# Patient Record
Sex: Male | Born: 1950
Health system: Southern US, Community
[De-identification: ages and names within clinical notes are randomized; demographics above are authoritative.]

## PROBLEM LIST (undated history)

## (undated) DIAGNOSIS — N289 Disorder of kidney and ureter, unspecified: Secondary | ICD-10-CM

## (undated) DIAGNOSIS — R918 Other nonspecific abnormal finding of lung field: Secondary | ICD-10-CM

## (undated) DIAGNOSIS — K759 Inflammatory liver disease, unspecified: Secondary | ICD-10-CM

## (undated) DIAGNOSIS — R0600 Dyspnea, unspecified: Secondary | ICD-10-CM

## (undated) DIAGNOSIS — C801 Malignant (primary) neoplasm, unspecified: Secondary | ICD-10-CM

## (undated) DIAGNOSIS — M1990 Unspecified osteoarthritis, unspecified site: Secondary | ICD-10-CM

## (undated) DIAGNOSIS — J939 Pneumothorax, unspecified: Secondary | ICD-10-CM

## (undated) DIAGNOSIS — M359 Systemic involvement of connective tissue, unspecified: Secondary | ICD-10-CM

## (undated) DIAGNOSIS — J189 Pneumonia, unspecified organism: Secondary | ICD-10-CM

## (undated) HISTORY — PX: OTHER SURGICAL HISTORY: SHX169

---

## 2016-03-25 ENCOUNTER — Emergency Department: Payer: Medicare Other

## 2016-03-25 ENCOUNTER — Encounter: Payer: Self-pay | Admitting: Emergency Medicine

## 2016-03-25 DIAGNOSIS — D721 Eosinophilia: Secondary | ICD-10-CM | POA: Diagnosis not present

## 2016-03-25 DIAGNOSIS — C7931 Secondary malignant neoplasm of brain: Secondary | ICD-10-CM | POA: Diagnosis not present

## 2016-03-25 DIAGNOSIS — M79662 Pain in left lower leg: Secondary | ICD-10-CM | POA: Diagnosis not present

## 2016-03-25 DIAGNOSIS — R252 Cramp and spasm: Secondary | ICD-10-CM | POA: Insufficient documentation

## 2016-03-25 DIAGNOSIS — C787 Secondary malignant neoplasm of liver and intrahepatic bile duct: Secondary | ICD-10-CM | POA: Diagnosis not present

## 2016-03-25 DIAGNOSIS — R918 Other nonspecific abnormal finding of lung field: Secondary | ICD-10-CM | POA: Diagnosis not present

## 2016-03-25 DIAGNOSIS — M1990 Unspecified osteoarthritis, unspecified site: Secondary | ICD-10-CM

## 2016-03-25 DIAGNOSIS — C3411 Malignant neoplasm of upper lobe, right bronchus or lung: Secondary | ICD-10-CM | POA: Diagnosis not present

## 2016-03-25 DIAGNOSIS — R05 Cough: Secondary | ICD-10-CM | POA: Diagnosis not present

## 2016-03-25 DIAGNOSIS — E43 Unspecified severe protein-calorie malnutrition: Secondary | ICD-10-CM | POA: Diagnosis not present

## 2016-03-25 DIAGNOSIS — D72829 Elevated white blood cell count, unspecified: Secondary | ICD-10-CM | POA: Insufficient documentation

## 2016-03-25 DIAGNOSIS — M79605 Pain in left leg: Secondary | ICD-10-CM | POA: Diagnosis not present

## 2016-03-25 DIAGNOSIS — I639 Cerebral infarction, unspecified: Secondary | ICD-10-CM | POA: Diagnosis not present

## 2016-03-25 LAB — BASIC METABOLIC PANEL
Anion gap: 10 (ref 5–15)
BUN: 12 mg/dL (ref 6–20)
CALCIUM: 8.6 mg/dL — AB (ref 8.9–10.3)
CHLORIDE: 103 mmol/L (ref 101–111)
CO2: 20 mmol/L — AB (ref 22–32)
CREATININE: 0.89 mg/dL (ref 0.61–1.24)
GFR calc non Af Amer: >60 mL/min (ref >60)
GLUCOSE: 124 mg/dL — AB (ref 65–99)
Potassium: 4.1 mmol/L (ref 3.5–5.1)
Sodium: 133 mmol/L — ABNORMAL LOW (ref 135–145)

## 2016-03-25 LAB — CBC
HEMATOCRIT: 40.7 % (ref 40.0–52.0)
Hemoglobin: 13.5 g/dL (ref 13.0–18.0)
MCH: 28.7 pg (ref 26.0–34.0)
MCHC: 33 g/dL (ref 32.0–36.0)
MCV: 87 fL (ref 80.0–100.0)
Platelets: 210 10*3/uL (ref 150–440)
RBC: 4.69 MIL/uL (ref 4.40–5.90)
RDW: 15.3 % — AB (ref 11.5–14.5)
WBC: 72.3 10*3/uL (ref 3.8–10.6)

## 2016-03-25 NOTE — ED Notes (Signed)
Pt states his left leg started cramping approx 1 hr ago and has pain behind left knee. Pulses good, no heat,

## 2016-03-25 NOTE — ED Notes (Signed)
Pt states weigh loss over the last 3 months while being sick possibly with flu but did not get diagnosed. Still has cough.

## 2016-03-26 ENCOUNTER — Emergency Department: Payer: Medicare Other

## 2016-03-26 ENCOUNTER — Emergency Department
Admission: EM | Admit: 2016-03-26 | Discharge: 2016-03-26 | Disposition: A | Discharge status: Home / Self Care | Payer: Medicare Other | Source: Home / Self Care | Attending: Emergency Medicine | Admitting: Emergency Medicine

## 2016-03-26 DIAGNOSIS — R252 Cramp and spasm: Secondary | ICD-10-CM

## 2016-03-26 DIAGNOSIS — R918 Other nonspecific abnormal finding of lung field: Secondary | ICD-10-CM

## 2016-03-26 DIAGNOSIS — R05 Cough: Secondary | ICD-10-CM | POA: Diagnosis not present

## 2016-03-26 DIAGNOSIS — D72829 Elevated white blood cell count, unspecified: Secondary | ICD-10-CM

## 2016-03-26 HISTORY — DX: Disorder of kidney and ureter, unspecified: N28.9

## 2016-03-26 HISTORY — DX: Unspecified osteoarthritis, unspecified site: M19.90

## 2016-03-26 HISTORY — DX: Pneumothorax, unspecified: J93.9

## 2016-03-26 LAB — URIC ACID: Uric Acid, Serum: 5 mg/dL (ref 4.4–7.6)

## 2016-03-26 LAB — DIFFERENTIAL
BASOS ABS: 0.7 10*3/uL — AB (ref 0–0.1)
BLASTS: 0 %
Band Neutrophils: 2 %
Basophils Relative: 1 %
EOS PCT: 53 %
Eosinophils Absolute: 38.3 10*3/uL — ABNORMAL HIGH (ref 0–0.7)
Lymphocytes Relative: 2 %
Lymphs Abs: 1.5 10*3/uL (ref 1.0–3.6)
MONOS PCT: 5 %
Metamyelocytes Relative: 0 %
Monocytes Absolute: 3.6 10*3/uL — ABNORMAL HIGH (ref 0.2–1.0)
Myelocytes: 0 %
NRBC: 0 /100{WBCs}
Neutro Abs: 28.2 10*3/uL — ABNORMAL HIGH (ref 1.4–6.5)
Neutrophils Relative %: 37 %
Other: 0 %
Promyelocytes Absolute: 0 %

## 2016-03-26 LAB — LACTATE DEHYDROGENASE: LDH: 204 U/L — AB (ref 98–192)

## 2016-03-26 MED ORDER — OXYCODONE-ACETAMINOPHEN 5-325 MG PO TABS
1.0000 | ORAL_TABLET | ORAL | Status: DC | PRN
  Administered 2016-03-26: 1 via ORAL
  Filled 2016-03-26: qty 1

## 2016-03-26 NOTE — ED Provider Notes (Signed)
[facility] Emergency Department Provider Note   ____________________________________________  Time seen: ~0325  I have reviewed the triage vital signs and the nursing notes.   HISTORY  Chief Complaint Leg Pain   History limited by: Not Limited   HPI Gerald [name] is a 65 y.o. male who presented to the emergency department today because of concerns for left leg pain. He stated that it was located behind his left knee and left calf. It started earlier today. He was out patient when it started. It became severe. He describes the quality as cramping. It has gradually improved since then. Said he thought he felt some tingling in that leg. In addition the patient states he has had a cough for the past 3 months. In addition he has noticed some night sweats and weight loss.   Past Medical History  Diagnosis Date  . Arthritis   . Pneumothorax     spontaneous  . Renal disorder     There are no active problems to display for this patient.   History reviewed. No pertinent past surgical history.  No current outpatient prescriptions on file.  Allergies Ibuprofen and Sulfa antibiotics  History reviewed. No pertinent family history.  Social History Social History  Substance Use Topics  . Smoking status: Never Smoker   . Smokeless tobacco: None  . Alcohol Use: Yes     Comment: occasionally    Review of Systems  Constitutional: Negative for fever. Cardiovascular: Negative for chest pain. Respiratory: Negative for shortness of breath.Positive for cough Gastrointestinal: Negative for abdominal pain, vomiting and diarrhea. Neurological: Negative for headaches, focal weakness or numbness.  10-point ROS otherwise negative.  ____________________________________________   PHYSICAL EXAM:  VITAL SIGNS: ED Triage Vitals  Enc Vitals Group     BP 03/25/16 2123 122/77 mmHg     Pulse Rate 03/25/16 2123 108     Resp 03/25/16 2123 18     Temp 03/25/16  2123 97.9 F (36.6 C)     Temp src --      SpO2 03/25/16 2123 100 %     Weight 03/25/16 2123 140 lb (63.504 kg)     Height 03/25/16 2123 '5\' 11"'$  (1.803 m)     Head Cir --      Peak Flow --      Pain Score 03/25/16 2127 3   Constitutional: Alert and oriented. Cachectic Eyes: Conjunctivae are normal. PERRL. Normal extraocular movements. ENT   Head: Normocephalic and atraumatic.   Nose: No congestion/rhinnorhea.   Mouth/Throat: Mucous membranes are moist.   Neck: No stridor. Hematological/Lymphatic/Immunilogical: No cervical lymphadenopathy. Cardiovascular: Normal rate, regular rhythm.  No murmurs, rubs, or gallops. Respiratory: Normal respiratory effort without tachypnea nor retractions. Breath sounds are clear and equal bilaterally. No wheezes/rales/rhonchi. Gastrointestinal: Soft and nontender. No distention.  Genitourinary: Deferred Musculoskeletal: Normal range of motion in all extremities. No joint effusions.  No lower extremity tenderness nor edema. No discoloration or deformity to the left leg. No tenderness palpation of the calf. Compartments are soft. Dorsalis pedis 2+. Neurologic:  Normal speech and language. No gross focal neurologic deficits are appreciated.  Skin:  Skin is warm, dry and intact. No rash noted. Psychiatric: Mood and affect are normal. Speech and behavior are normal. Patient exhibits appropriate insight and judgment.  ____________________________________________    LABS (pertinent positives/negatives)  Labs Reviewed  CBC - Abnormal; Notable for the following:    WBC 72.3 (*)    RDW 15.3 (*)    All other  components within normal limits  BASIC METABOLIC PANEL - Abnormal; Notable for the following:    Sodium 133 (*)    CO2 20 (*)    Glucose, Bld 124 (*)    Calcium 8.6 (*)    All other components within normal limits  DIFFERENTIAL - Abnormal; Notable for the following:    Neutro Abs 28.2 (*)    Monocytes Absolute 3.6 (*)    Eosinophils  Absolute 38.3 (*)    Basophils Absolute 0.7 (*)    All other components within normal limits  LACTATE DEHYDROGENASE - Abnormal; Notable for the following:    LDH 204 (*)    All other components within normal limits  URIC ACID     ____________________________________________   EKG  None  ____________________________________________    RADIOLOGY  CXR  IMPRESSION: Central right lung mass, highly suspicious for a primary malignancy. Right paratracheal adenopathy. Probable sub-carinal adenopathy. Recommend chest CT with contrast.  US venous IMPRESSION: No evidence of deep venous thrombosis in the left lower extremity.  ____________________________________________   PROCEDURES  Procedure(s) performed: None  Critical Care performed: No  ____________________________________________   INITIAL IMPRESSION / ASSESSMENT AND PLAN / ED COURSE  Pertinent labs & imaging results that were available during my care of the patient were reviewed by me and considered in my medical decision making (see chart for details).  Patient presented to the emergency department today because of concerns for left leg pain. The workup for the leg was unremarkable. Think this could be muscle cramping. He is CBC however was grossly abnormal with a severely elevated white blood count primarily of neutrophils in the sauna fills. Because the patient had a history of smoking and was also complaining of cough a chest x-ray was obtained. This was concerning for a primary malignancy. I had a discussion with the patient about this finding. Furthermore discussed with Dr. Mike Gip with oncology. She recommended LDH and uric acid at all. The LDH was very minimally elevated. At this point do not believe patient is a tumor lysis syndrome. Feel he is safe for discharge with oncology follow-up on Monday.  ____________________________________________   FINAL CLINICAL IMPRESSION(S) / ED DIAGNOSES  Final diagnoses:   Cramps of left lower extremity  Lung mass  Leukocytosis     Nance Pear, MD 03/26/16 [phone]

## 2016-03-26 NOTE — Discharge Instructions (Signed)
As we discussed it is very important that you follow up with Dr. Mike Gip on Monday. Please give their office a call first thing in the morning. Please seek medical attention for any high fevers, chest pain, shortness of breath, change in behavior, persistent vomiting, bloody stool or any other new or concerning symptoms.   Lung Cancer Lung cancer occurs when abnormal cells in the lung grow out of control and form a mass (tumor). There are several types of lung cancer. The two most common types are:  Non-small cell. In this type of lung cancer, abnormal cells are larger and grow more slowly than those of small cell lung cancer.  Small cell. In this type of lung cancer, abnormal cells are smaller than those of non-small cell lung cancer. Small cell lung cancer gets worse faster than non-small cell lung cancer. CAUSES  The leading cause of lung cancer is smoking tobacco. The second leading cause is radon exposure. RISK FACTORS  Smoking tobacco.  Exposure to secondhand tobacco smoke.  Exposure to radon gas.  Exposure to asbestos.  Exposure to arsenic in drinking water.  Air pollution.  Family or personal history of lung cancer.  Lung radiation therapy.  Being older than 69 years. SIGNS AND SYMPTOMS  In the early stages, symptoms may not be present. As the cancer progresses, symptoms may include:  A lasting cough, possibly with blood.  Fatigue.  Unexplained weight loss.  Shortness of breath.  Wheezing.  Chest pain.  Loss of appetite. Symptoms of advanced lung cancer include:  Hoarseness.  Bone or joint pain.  Weakness.  Nail problems.  Face or arm swelling.  Paralysis of the face.  Drooping eyelids. DIAGNOSIS  Lung cancer can be identified with a physical exam and with tests such as:  A chest X-ray.  A CT scan.  Blood tests.  A biopsy. After a diagnosis is made, you will have more tests to determine the stage of the cancer. The stages of non-small  cell lung cancer are:  Stage 0, also called carcinoma in situ. At this stage, abnormal cells are found in the inner lining of your lung or lungs.  Stage I. At this stage, abnormal cells have grown into a tumor that is no larger than 5 cm across. The cancer has entered the deeper lung tissue but has not yet entered the lymph nodes or other parts of the body.  Stage II. At this stage, the tumor is 7 cm across or smaller and has entered nearby lymph nodes. Or, the tumor is 5 cm across or smaller and has invaded surrounding tissue but is not found in nearby lymph nodes. There may be more than one tumor present.  Stage III. At this stage, the tumor may be any size. There may be more than one tumor in the lungs. The cancer cells have spread to the lymph nodes and possibly to other organs.  Stage IV. At this stage, there are tumors in both lungs and the cancer has spread to other areas of the body. The stages of small cell lung cancer are:  Limited. At this stage, the cancer is found only on one side of the chest.  Extensive. At this stage, the cancer is in the lungs and in tissues on the other side of the chest. The cancer has spread to other organs or is found in the fluid between the layers of your lungs. TREATMENT  Depending on the type and stage of your lung cancer, you may be treated  with:  Surgery. This is done to remove a tumor.  Radiation therapy. This treatment destroys cancer cells using X-rays or other types of radiation.  Chemotherapy. This treatment uses medicines to destroy cancer cells.  Targeted therapy. This treatment aims to destroy only cancer cells instead of all cells as other therapies do. You may also have a combination of treatments. HOME CARE INSTRUCTIONS   Do not use any tobacco products. This includes cigarettes, chewing tobacco, and electronic cigarettes. If you need help quitting, ask your health care provider.  Take medicines only as directed by your health care  provider.  Eat a healthy diet. Work with a dietitian to make sure you are getting the nutrition you need.  Consider joining a support group or seeking counseling to help you cope with the stress of having lung cancer.  Let your cancer specialist (oncologist) know if you are admitted to the hospital.  Keep all follow-up visits as directed by your health care provider. This is important. SEEK MEDICAL CARE IF:   You lose weight without trying.  You have a persistent cough and wheezing.  You feel short of breath.  You tire easily.  You experience bone or joint pain.  You have difficulty swallowing.  You feel hoarse or notice your voice changing.  Your pain medicine is not helping. SEEK IMMEDIATE MEDICAL CARE IF:   You cough up blood.  You have new breathing problems.  You develop chest pain.  You develop swelling in:  One or both ankles or legs.  Your face, neck, or arms.  You are confused.  You experience paralysis in your face or a drooping eyelid.   This information is not intended to replace advice given to you by your health care provider. Make sure you discuss any questions you have with your health care provider.   Document Released: 03/05/2001 Document Revised: 08/18/2015 Document Reviewed: 04/02/2014 Elsevier Interactive Patient Education [employer].

## 2016-03-27 ENCOUNTER — Encounter: Payer: Self-pay | Admitting: Hematology and Oncology

## 2016-03-27 ENCOUNTER — Inpatient Hospital Stay
Admission: EM | Admit: 2016-03-27 | Discharge: 2016-03-31 | DRG: 166 | Discharge status: Against Medical Advice | Payer: Medicare Other | Attending: Internal Medicine | Admitting: Internal Medicine

## 2016-03-27 ENCOUNTER — Telehealth: Payer: Self-pay

## 2016-03-27 ENCOUNTER — Inpatient Hospital Stay: Payer: Medicare Other

## 2016-03-27 ENCOUNTER — Encounter: Payer: Self-pay | Admitting: Emergency Medicine

## 2016-03-27 ENCOUNTER — Emergency Department: Payer: Medicare Other

## 2016-03-27 ENCOUNTER — Other Ambulatory Visit: Payer: Self-pay

## 2016-03-27 ENCOUNTER — Inpatient Hospital Stay: Payer: Medicare Other | Attending: Hematology and Oncology | Admitting: Hematology and Oncology

## 2016-03-27 VITALS — BP 103/67 | HR 106 | Temp 98.3°F | Wt 132.3 lb

## 2016-03-27 DIAGNOSIS — C3411 Malignant neoplasm of upper lobe, right bronchus or lung: Principal | ICD-10-CM | POA: Diagnosis present

## 2016-03-27 DIAGNOSIS — I493 Ventricular premature depolarization: Secondary | ICD-10-CM | POA: Diagnosis present

## 2016-03-27 DIAGNOSIS — D72829 Elevated white blood cell count, unspecified: Secondary | ICD-10-CM | POA: Diagnosis not present

## 2016-03-27 DIAGNOSIS — M1990 Unspecified osteoarthritis, unspecified site: Secondary | ICD-10-CM | POA: Diagnosis present

## 2016-03-27 DIAGNOSIS — K769 Liver disease, unspecified: Secondary | ICD-10-CM | POA: Insufficient documentation

## 2016-03-27 DIAGNOSIS — I639 Cerebral infarction, unspecified: Secondary | ICD-10-CM

## 2016-03-27 DIAGNOSIS — I517 Cardiomegaly: Secondary | ICD-10-CM

## 2016-03-27 DIAGNOSIS — D721 Eosinophilia, unspecified: Secondary | ICD-10-CM

## 2016-03-27 DIAGNOSIS — E43 Unspecified severe protein-calorie malnutrition: Secondary | ICD-10-CM | POA: Diagnosis not present

## 2016-03-27 DIAGNOSIS — C3490 Malignant neoplasm of unspecified part of unspecified bronchus or lung: Secondary | ICD-10-CM

## 2016-03-27 DIAGNOSIS — M879 Osteonecrosis, unspecified: Secondary | ICD-10-CM | POA: Insufficient documentation

## 2016-03-27 DIAGNOSIS — Z87891 Personal history of nicotine dependence: Secondary | ICD-10-CM

## 2016-03-27 DIAGNOSIS — R05 Cough: Secondary | ICD-10-CM | POA: Diagnosis not present

## 2016-03-27 DIAGNOSIS — Z79899 Other long term (current) drug therapy: Secondary | ICD-10-CM | POA: Insufficient documentation

## 2016-03-27 DIAGNOSIS — M79605 Pain in left leg: Secondary | ICD-10-CM | POA: Insufficient documentation

## 2016-03-27 DIAGNOSIS — R634 Abnormal weight loss: Secondary | ICD-10-CM | POA: Insufficient documentation

## 2016-03-27 DIAGNOSIS — C7989 Secondary malignant neoplasm of other specified sites: Secondary | ICD-10-CM | POA: Diagnosis present

## 2016-03-27 DIAGNOSIS — I248 Other forms of acute ischemic heart disease: Secondary | ICD-10-CM | POA: Diagnosis present

## 2016-03-27 DIAGNOSIS — C7931 Secondary malignant neoplasm of brain: Secondary | ICD-10-CM | POA: Diagnosis present

## 2016-03-27 DIAGNOSIS — C787 Secondary malignant neoplasm of liver and intrahepatic bile duct: Secondary | ICD-10-CM | POA: Diagnosis present

## 2016-03-27 DIAGNOSIS — Z681 Body mass index (BMI) 19 or less, adult: Secondary | ICD-10-CM | POA: Diagnosis not present

## 2016-03-27 DIAGNOSIS — R748 Abnormal levels of other serum enzymes: Secondary | ICD-10-CM | POA: Diagnosis not present

## 2016-03-27 DIAGNOSIS — Z882 Allergy status to sulfonamides status: Secondary | ICD-10-CM

## 2016-03-27 DIAGNOSIS — M129 Arthropathy, unspecified: Secondary | ICD-10-CM

## 2016-03-27 DIAGNOSIS — I5189 Other ill-defined heart diseases: Secondary | ICD-10-CM | POA: Insufficient documentation

## 2016-03-27 DIAGNOSIS — M069 Rheumatoid arthritis, unspecified: Secondary | ICD-10-CM | POA: Diagnosis present

## 2016-03-27 DIAGNOSIS — Z808 Family history of malignant neoplasm of other organs or systems: Secondary | ICD-10-CM | POA: Diagnosis not present

## 2016-03-27 DIAGNOSIS — Z01818 Encounter for other preprocedural examination: Secondary | ICD-10-CM | POA: Diagnosis not present

## 2016-03-27 DIAGNOSIS — R200 Anesthesia of skin: Secondary | ICD-10-CM | POA: Insufficient documentation

## 2016-03-27 DIAGNOSIS — R7989 Other specified abnormal findings of blood chemistry: Secondary | ICD-10-CM

## 2016-03-27 DIAGNOSIS — D72822 Plasmacytosis: Secondary | ICD-10-CM | POA: Insufficient documentation

## 2016-03-27 DIAGNOSIS — D479 Neoplasm of uncertain behavior of lymphoid, hematopoietic and related tissue, unspecified: Secondary | ICD-10-CM

## 2016-03-27 DIAGNOSIS — I499 Cardiac arrhythmia, unspecified: Secondary | ICD-10-CM

## 2016-03-27 DIAGNOSIS — R918 Other nonspecific abnormal finding of lung field: Secondary | ICD-10-CM

## 2016-03-27 DIAGNOSIS — N289 Disorder of kidney and ureter, unspecified: Secondary | ICD-10-CM

## 2016-03-27 DIAGNOSIS — E871 Hypo-osmolality and hyponatremia: Secondary | ICD-10-CM | POA: Diagnosis not present

## 2016-03-27 DIAGNOSIS — R52 Pain, unspecified: Secondary | ICD-10-CM

## 2016-03-27 DIAGNOSIS — Z801 Family history of malignant neoplasm of trachea, bronchus and lung: Secondary | ICD-10-CM | POA: Insufficient documentation

## 2016-03-27 DIAGNOSIS — C7951 Secondary malignant neoplasm of bone: Secondary | ICD-10-CM | POA: Diagnosis present

## 2016-03-27 DIAGNOSIS — Z888 Allergy status to other drugs, medicaments and biological substances status: Secondary | ICD-10-CM

## 2016-03-27 DIAGNOSIS — D471 Chronic myeloproliferative disease: Secondary | ICD-10-CM | POA: Diagnosis not present

## 2016-03-27 DIAGNOSIS — G939 Disorder of brain, unspecified: Secondary | ICD-10-CM | POA: Insufficient documentation

## 2016-03-27 DIAGNOSIS — Z0189 Encounter for other specified special examinations: Secondary | ICD-10-CM

## 2016-03-27 DIAGNOSIS — Z1389 Encounter for screening for other disorder: Secondary | ICD-10-CM

## 2016-03-27 DIAGNOSIS — R599 Enlarged lymph nodes, unspecified: Secondary | ICD-10-CM | POA: Insufficient documentation

## 2016-03-27 DIAGNOSIS — R630 Anorexia: Secondary | ICD-10-CM | POA: Insufficient documentation

## 2016-03-27 DIAGNOSIS — R899 Unspecified abnormal finding in specimens from other organs, systems and tissues: Secondary | ICD-10-CM | POA: Diagnosis not present

## 2016-03-27 DIAGNOSIS — Z809 Family history of malignant neoplasm, unspecified: Secondary | ICD-10-CM | POA: Insufficient documentation

## 2016-03-27 DIAGNOSIS — R059 Cough, unspecified: Secondary | ICD-10-CM | POA: Insufficient documentation

## 2016-03-27 DIAGNOSIS — R778 Other specified abnormalities of plasma proteins: Secondary | ICD-10-CM | POA: Insufficient documentation

## 2016-03-27 DIAGNOSIS — D649 Anemia, unspecified: Secondary | ICD-10-CM | POA: Diagnosis not present

## 2016-03-27 DIAGNOSIS — I998 Other disorder of circulatory system: Secondary | ICD-10-CM | POA: Insufficient documentation

## 2016-03-27 DIAGNOSIS — R5383 Other fatigue: Secondary | ICD-10-CM | POA: Insufficient documentation

## 2016-03-27 DIAGNOSIS — C799 Secondary malignant neoplasm of unspecified site: Secondary | ICD-10-CM | POA: Diagnosis not present

## 2016-03-27 DIAGNOSIS — F17200 Nicotine dependence, unspecified, uncomplicated: Secondary | ICD-10-CM | POA: Diagnosis not present

## 2016-03-27 HISTORY — DX: Systemic involvement of connective tissue, unspecified: M35.9

## 2016-03-27 HISTORY — DX: Other nonspecific abnormal finding of lung field: R91.8

## 2016-03-27 LAB — URINALYSIS COMPLETE WITH MICROSCOPIC (ARMC ONLY)
BACTERIA UA: NONE SEEN
Bilirubin Urine: NEGATIVE
Glucose, UA: NEGATIVE mg/dL
HGB URINE DIPSTICK: NEGATIVE
Ketones, ur: NEGATIVE mg/dL
LEUKOCYTES UA: NEGATIVE
Nitrite: NEGATIVE
PH: 5 (ref 5.0–8.0)
Protein, ur: NEGATIVE mg/dL
RBC / HPF: NONE SEEN RBC/hpf (ref 0–5)
Specific Gravity, Urine: 1.02 (ref 1.005–1.030)

## 2016-03-27 LAB — CBC WITH DIFFERENTIAL/PLATELET
Band Neutrophils: 3 %
Basophils Absolute: 1.7 10*3/uL — ABNORMAL HIGH (ref 0–0.1)
Basophils Relative: 2 %
Eosinophils Absolute: 47.3 10*3/uL — ABNORMAL HIGH (ref 0–0.7)
Eosinophils Relative: 56 %
HCT: 39.5 % — ABNORMAL LOW (ref 40.0–52.0)
Hemoglobin: 12.9 g/dL — ABNORMAL LOW (ref 13.0–18.0)
Lymphocytes Relative: 6 %
Lymphs Abs: 5.1 10*3/uL — ABNORMAL HIGH (ref 1.0–3.6)
MCH: 29 pg (ref 26.0–34.0)
MCHC: 32.7 g/dL (ref 32.0–36.0)
MCV: 88.5 fL (ref 80.0–100.0)
Monocytes Absolute: 3.4 10*3/uL — ABNORMAL HIGH (ref 0.2–1.0)
Monocytes Relative: 4 %
Neutro Abs: 27 10*3/uL — ABNORMAL HIGH (ref 1.4–6.5)
Neutrophils Relative %: 29 %
Platelets: 237 10*3/uL (ref 150–440)
RBC: 4.47 MIL/uL (ref 4.40–5.90)
RDW: 15.1 % — ABNORMAL HIGH (ref 11.5–14.5)
WBC: 84.4 10*3/uL (ref 3.8–10.6)

## 2016-03-27 LAB — COMPREHENSIVE METABOLIC PANEL
ALT: 28 U/L (ref 17–63)
AST: 30 U/L (ref 15–41)
Albumin: 3.1 g/dL — ABNORMAL LOW (ref 3.5–5.0)
Alkaline Phosphatase: 85 U/L (ref 38–126)
Anion gap: 5 (ref 5–15)
BUN: 12 mg/dL (ref 6–20)
CO2: 25 mmol/L (ref 22–32)
Calcium: 8.2 mg/dL — ABNORMAL LOW (ref 8.9–10.3)
Chloride: 102 mmol/L (ref 101–111)
Creatinine, Ser: 0.86 mg/dL (ref 0.61–1.24)
GFR calc Af Amer: >60 mL/min (ref >60)
GFR calc non Af Amer: >60 mL/min (ref >60)
Glucose, Bld: 88 mg/dL (ref 65–99)
Potassium: 3.8 mmol/L (ref 3.5–5.1)
Sodium: 132 mmol/L — ABNORMAL LOW (ref 135–145)
Total Bilirubin: 0.8 mg/dL (ref 0.3–1.2)
Total Protein: 8.5 g/dL — ABNORMAL HIGH (ref 6.5–8.1)

## 2016-03-27 LAB — PATHOLOGIST SMEAR REVIEW

## 2016-03-27 LAB — TROPONIN I
TROPONIN I: 0.07 ng/mL — AB (ref <0.031)
TROPONIN I: 0.07 ng/mL — AB (ref <0.031)
Troponin I: 0.11 ng/mL — ABNORMAL HIGH (ref <0.031)

## 2016-03-27 LAB — TSH: TSH: 1.849 u[IU]/mL (ref 0.350–4.500)

## 2016-03-27 LAB — VITAMIN B12: Vitamin B-12: 203 pg/mL (ref 180–914)

## 2016-03-27 MED ORDER — ONDANSETRON HCL 4 MG/2ML IJ SOLN: 4.0000 mg | Freq: Four times a day (QID) | INTRAMUSCULAR | Status: DC | PRN

## 2016-03-27 MED ORDER — OXYCODONE HCL 5 MG PO TABS
5.0000 mg | ORAL_TABLET | ORAL | Status: DC | PRN
  Administered 2016-03-29: 5 mg via ORAL
  Filled 2016-03-27: qty 1

## 2016-03-27 MED ORDER — SODIUM CHLORIDE 0.9 % IV BOLUS (SEPSIS)
1000.0000 mL | Freq: Once | INTRAVENOUS | Status: AC
  Administered 2016-03-27: 1000 mL via INTRAVENOUS

## 2016-03-27 MED ORDER — ACETAMINOPHEN 325 MG PO TABS: 650.0000 mg | ORAL_TABLET | Freq: Four times a day (QID) | ORAL | Status: DC | PRN

## 2016-03-27 MED ORDER — PNEUMOCOCCAL VAC POLYVALENT 25 MCG/0.5ML IJ INJ: 0.5000 mL | INJECTION | INTRAMUSCULAR | Status: DC

## 2016-03-27 MED ORDER — IOPAMIDOL (ISOVUE-370) INJECTION 76%
75.0000 mL | Freq: Once | INTRAVENOUS | Status: AC | PRN
  Administered 2016-03-27: 75 mL via INTRAVENOUS

## 2016-03-27 MED ORDER — HEPARIN SODIUM (PORCINE) 5000 UNIT/ML IJ SOLN
5000.0000 [IU] | Freq: Three times a day (TID) | INTRAMUSCULAR | Status: DC
  Administered 2016-03-27: 5000 [IU] via SUBCUTANEOUS
  Filled 2016-03-27: qty 1

## 2016-03-27 MED ORDER — SODIUM CHLORIDE 0.9 % IV SOLN
INTRAVENOUS | Status: DC
  Administered 2016-03-27 – 2016-03-28 (×2): via INTRAVENOUS

## 2016-03-27 MED ORDER — ACETAMINOPHEN 650 MG RE SUPP: 650.0000 mg | Freq: Four times a day (QID) | RECTAL | Status: DC | PRN

## 2016-03-27 MED ORDER — ONDANSETRON HCL 4 MG PO TABS: 4.0000 mg | ORAL_TABLET | Freq: Four times a day (QID) | ORAL | Status: DC | PRN

## 2016-03-27 MED ORDER — SODIUM CHLORIDE 0.9% FLUSH
3.0000 mL | Freq: Two times a day (BID) | INTRAVENOUS | Status: DC
  Administered 2016-03-27 – 2016-03-31 (×5): 3 mL via INTRAVENOUS

## 2016-03-27 NOTE — Progress Notes (Signed)
New patient today to be eval for Lung mass and leukocytosis. Went to ED on 03/25/16 with L leg pain; still has some leg pain but it is no longer severe. Has had an ongoing cough since he" caught the Flu " on the first week of January. Pt has lost 30 lbs since December. He has barely left the house in 3 months. Pt states he has been eating quite a bit lately. Has very  infrequent nausea. BM's are irregular, some constipation, some diarrhea. Denies headaches or dizziness. Voiding normally. Has RA, off methotrexate, not taking anything. Quit smoking 15 years ago. Smoker for 26 years ~ 1/2 pack per day.

## 2016-03-27 NOTE — Telephone Encounter (Signed)
Called and spoke with daughter and pt.  Critical troponin called in of 0.11  Per MD she would like pt to follow up in ER.  MD aware.  Pt verbalized an understanding and was still downstairs speaking with a friend in cancer center when called.  Pt verbalized he will head over to ER.  No other concerns noted.

## 2016-03-27 NOTE — H&P (Signed)
Gerald [name]    MR#:  [id number]  DATE OF BIRTH:  12/05/1951  DATE OF ADMISSION:  03/27/2016  PRIMARY CARE PHYSICIAN: No PCP Per Patient   REQUESTING/REFERRING PHYSICIAN: Dr. Harvest Dark  CHIEF COMPLAINT:   Chief Complaint  Patient presents with  . Abnormal Lab    HISTORY OF PRESENT ILLNESS:  Gerald [name]  is a 65 y.o. male with a known history of rheumatoid arthritis not taking any medications, who has no PCP presented to the emergency room 2 days ago for left leg pain. Dopplers were done at the time and did not reveal any DVT. Blood work showed increased white count of 72,000 and also chest x-ray at the time showing central right lung mass suspicious for primary malignancy. Patient was given an appointment to see oncologist for today. He was at his oncologist's office and routine blood work there indicated that his troponin was elevated at 0.11 and so sent to the emergency room. Patient denies any chest pain or palpitations. He says he's been losing weight for almost 3-4 months after a viral illness. He lost almost 30 pounds in the last 3 months. Denies any nausea or vomiting now but has had occasional nausea. Denies any headaches or dizziness. Bowel movements are irregular. No fevers or chills currently. He's been having dry cough since his viral fever, occasionally hasn't paid any significant attention to it. No hemoptysis. Denies any chest pain or palpitations. His breathing has become shallower lately but no sudden change in breathing noted. CT of the chest in the emergency room here reveals 8 cm right upper lobe mass possibly invading the left atrium. And also possible mass in the left liver lobe concerning for hepatic metastasis disease. So patient is being admitted for the same.  PAST MEDICAL HISTORY:   Past Medical History  Diagnosis Date  . Arthritis     Rheumatoid arthritis  . Pneumothorax      spontaneous  . Renal disorder     as a child  . Mass of lung   . Collagen vascular disease ([place])     RA    PAST SURGICAL HISTORY:   Past Surgical History  Procedure Laterality Date  . Ureter repair as a child      SOCIAL HISTORY:   Social History  Substance Use Topics  . Smoking status: Former Smoker -- 0.50 packs/day for 26 years    Types: Cigarettes    Quit date: 01/31/2016  . Smokeless tobacco: Not on file     Comment: Quit about 5 years ago  . Alcohol Use: 0.0 oz/week    0 Standard drinks or equivalent per week     Comment: occasionally beer    FAMILY HISTORY:   Family History  Problem Relation Age of Onset  . Brain cancer Mother   . Lung cancer Maternal Uncle   . Cancer Sister     Metastatic cancer- unknown primary    DRUG ALLERGIES:   Allergies  Allergen Reactions  . Nsaids Other (See Comments)    Pt is unable to take this class of medications.    . Sulfa Antibiotics Other (See Comments)    Reaction:  GI upset     REVIEW OF SYSTEMS:   Review of Systems  Constitutional: Positive for weight loss and malaise/fatigue. Negative for fever and chills.  HENT: Negative for ear discharge, ear pain, nosebleeds and tinnitus.   Eyes: Negative for blurred  vision, double vision and photophobia.  Respiratory: Positive for shortness of breath. Negative for cough, hemoptysis and wheezing.   Cardiovascular: Negative for chest pain, palpitations, orthopnea and leg swelling.  Gastrointestinal: Negative for heartburn, nausea, vomiting, abdominal pain, diarrhea, constipation and melena.  Genitourinary: Negative for dysuria, urgency, frequency and hematuria.  Musculoskeletal: Negative for myalgias, back pain and neck pain.       Leg pain- left leg  Skin: Negative for rash.  Neurological: Negative for dizziness, tremors, sensory change, speech change, focal weakness and headaches.  Endo/Heme/Allergies: Does not bruise/bleed easily.  Psychiatric/Behavioral: Negative for  depression.    MEDICATIONS AT HOME:   Prior to Admission medications   Not on File      VITAL SIGNS:  Blood pressure 128/84, pulse 104, temperature 98 F (36.7 C), temperature source Oral, resp. rate 18, SpO2 99 %.  PHYSICAL EXAMINATION:   Physical Exam  GENERAL:  65 y.o.-year-old emaciated patient lying in the bed with no acute distress.  EYES: Pupils equal, round, reactive to light and accommodation. No scleral icterus. Extraocular muscles intact.  HEENT: Head atraumatic, normocephalic. Oropharynx and nasopharynx clear. Sunken temporal fossae NECK:  Supple, no jugular venous distention. No thyroid enlargement, no tenderness. No supraclavicular or neck lymphadenopathy LUNGS: Normal breath sounds bilaterally, scant breath sounds, no wheezing, rales,rhonchi or crepitation. No use of accessory muscles of respiration.  CARDIOVASCULAR: S1, S2 normal. Rapid and regular. No murmurs, rubs, or gallops.  ABDOMEN: Soft, nontender, nondistended. Bowel sounds present. No organomegaly or mass.  EXTREMITIES: No pedal edema, cyanosis, or clubbing. No calf tenderness NEUROLOGIC: Cranial nerves II through XII are intact. Muscle strength 5/5 in all extremities. Sensation intact. Gait not checked.  PSYCHIATRIC: The patient is alert and oriented x 3.  SKIN: No obvious rash, lesion, or ulcer.   LABORATORY PANEL:   CBC  Recent Labs Lab 03/27/16 1303  WBC 84.4*  HGB 12.9*  HCT 39.5*  PLT 237   ------------------------------------------------------------------------------------------------------------------  Chemistries   Recent Labs Lab 03/27/16 1303  NA 132*  K 3.8  CL 102  CO2 25  GLUCOSE 88  BUN 12  CREATININE 0.86  CALCIUM 8.2*  AST 30  ALT 28  ALKPHOS 85  BILITOT 0.8   ------------------------------------------------------------------------------------------------------------------  Cardiac Enzymes  Recent Labs Lab 03/27/16 1701  TROPONINI 0.07*    ------------------------------------------------------------------------------------------------------------------  RADIOLOGY:  Dg Chest 2 View  03/26/2016  CLINICAL DATA:  Cough and weight loss. EXAM: CHEST  2 VIEW COMPARISON:  None. FINDINGS: There is a 5-6 cm mass in the central right lung. There are abnormal mediastinal contours consistent with right peritracheal adenopathy, and there may also be subcarinal mediastinal adenopathy. There is generalized interstitial coarsening. There is blunting of the lateral and posterior costophrenic angles on the left, suggesting a small effusion. No right-sided effusion. IMPRESSION: Central right lung mass, highly suspicious for a primary malignancy. Right paratracheal adenopathy. Probable sub-carinal adenopathy. Recommend chest CT with contrast. These results will be called to the ordering clinician or representative by the Radiologist Assistant, and communication documented in the PACS or zVision Dashboard. Electronically Signed   By: Andreas Newport M.D.   On: 03/26/2016 02:54   Ct Angio Chest Pe W/cm &/or Wo Cm  03/27/2016  CLINICAL DATA:  Abnormal chest radiograph with lung mass, elevated troponin and tachycardia. EXAM: CT ANGIOGRAPHY CHEST WITH CONTRAST TECHNIQUE: Multidetector CT imaging of the chest was performed using the standard protocol during bolus administration of intravenous contrast. Multiplanar CT image reconstructions and MIPs were obtained to evaluate  the vascular anatomy. CONTRAST:  Study 5 mL Isovue 370 IV COMPARISON:  Chest radiographs dated 03/26/2016 FINDINGS: No evidence of pulmonary embolism. Pulmonary artery leading to the right middle lobe is attenuated by the dominant mass (described below). Mediastinum/Nodes: The heart is normal in size. No pericardial effusion. Three vessel coronary atherosclerosis. Thoracic lymphadenopathy, including: --1.6 cm short axis high right paratracheal node (series 4/image 45) --2.3 cm short axis right  paratracheal node (series 4/image 57) --1.5 cm short axis AP window node (series 4/image 60) --2.3 cm short axis low right paratracheal node (series 4/image 63) --1.9 cm short axis subcarinal node (series 4/image 76) Visualized thyroid is unremarkable. Lungs/Pleura: 8.0 x 4.7 x 5.7 cm mass in the medial right upper lobe (series 4/ image 78). Mass occludes a branch of the right upper lobe bronchus (series 6/ image 70) and narrows the right middle lobe bronchus (series 6/ image 88). Mass abuts the mediastinum and may indicate the left atrium, possibly via a pulmonary vein (series 4/ image 89). Increased interstitial markings with satellite nodularity in the right upper lobe measuring up to 10 x 14 mm (series 6/image 83), metastatic disease not excluded. Additional increased interstitial markings with volume loss in the right middle lobe (series 6/image 90), possibly reflecting postobstructive opacity. However, a 7 mm nodule is present in the right middle lobe (series 6/ image 98), and lymphangitic spread with additional tumor is not excluded. Biapical pleural-parenchymal scarring, right greater than left. Underlying mild centrilobular and paraseptal emphysematous changes with bullous changes of the left lung apex. Trace left pleural effusion. Mild dependent atelectasis in the left lower lobe. No pneumothorax. Upper abdomen: Suspected 2.6 cm enhancing lesion in the medial segment left hepatic lobe (series 4/image 151), worrisome for hepatic metastasis. Heterogeneous perfusion with scarring in the medial left upper kidney, likely reflecting sequela of prior infarct or infection. Multiple wedge-shaped infarcts of the spleen, likely reflecting prior vascular insults. Musculoskeletal: No focal osseous lesions. Review of the MIP images confirms the above findings. IMPRESSION: No evidence of pulmonary embolism. 8.0 cm mass in the medial right upper lobe, as described above, suspicious for primary bronchogenic neoplasm. Mass  abuts the mediastinum and possibly invades the left atrium. Associated widespread thoracic lymphadenopathy, as above. 2.6 cm enhancing lesion in the medial segment left hepatic lobe, worrisome for hepatic metastasis. Trace left pleural effusion. Electronically Signed   By: [name] Hy M.D.   On: 03/27/2016 18:05   US Venous Img Lower Unilateral Left  03/25/2016  CLINICAL DATA:  Posterior left knee and left calf pain. Shortness of breath for 3 months . EXAM: LEFT LOWER EXTREMITY VENOUS DOPPLER ULTRASOUND TECHNIQUE: Gray-scale sonography with graded compression, as well as color Doppler and duplex ultrasound were performed to evaluate the lower extremity deep venous systems from the level of the common femoral vein and including the common femoral, femoral, profunda femoral, popliteal and calf veins including the posterior tibial, peroneal and gastrocnemius veins when visible. The superficial great saphenous vein was also interrogated. Spectral Doppler was utilized to evaluate flow at rest and with distal augmentation maneuvers in the common femoral, femoral and popliteal veins. COMPARISON:  None. FINDINGS: Contralateral Common Femoral Vein: Respiratory phasicity is normal and symmetric with the symptomatic side. No evidence of thrombus. Normal compressibility. Common Femoral Vein: No evidence of thrombus. Normal compressibility, respiratory phasicity and response to augmentation. Saphenofemoral Junction: No evidence of thrombus. Normal compressibility and flow on color Doppler imaging. Profunda Femoral Vein: No evidence of thrombus. Normal compressibility and flow on  color Doppler imaging. Femoral Vein: No evidence of thrombus. Normal compressibility, respiratory phasicity and response to augmentation. Popliteal Vein: No evidence of thrombus. Normal compressibility, respiratory phasicity and response to augmentation. Calf Veins: No evidence of thrombus. Normal compressibility and flow on color Doppler  imaging. Superficial Great Saphenous Vein: No evidence of thrombus. Normal compressibility and flow on color Doppler imaging. Venous Reflux:  None. Other Findings:  None. IMPRESSION: No evidence of deep venous thrombosis in the left lower extremity. Electronically Signed   By: Ilona Sorrel M.D.   On: 03/25/2016 22:29    EKG:   Orders placed or performed during the hospital encounter of 03/27/16  . EKG 12-Lead  . EKG 12-Lead    IMPRESSION AND PLAN:   Gerald [name]  is a 65 y.o. male with a known history of rheumatoid arthritis not taking any medications, who has no PCP presented to the emergency room 2 days ago for left leg pain. He was noted to have white count of 72,000 and right lung mass and sent to oncologist office today. Workup at oncology office revealed elevated troponin and so patient is sent to the emergency room.  #1 elevated troponin- Likely demand ischemia and strain on the heart from the lung mass possibly abutting the left atrium - recycle troponins. PVCs on monitor- monitor on tele - ECHO ordered. Check TSH - oncology consulted.  #2 right lung mass- Right lung mass, 8cm, likely malignancy. Also liver lesion-? Metastatic spread - oncology consulted. PET scan likely  #3 leukocytosis-likely underlying lymphoproliferative disorder. White count today is further elevated at 84,000 -Bone marrow biopsy requested for tomorrow. Also will need a PET scan No evidence of any infection. Hold off on antibiotics. Check urine analysis though.  #4 rheumatoid arthritis-not following with any physician. No arthritis symptoms or flares at this time.  #5 DVT prophylaxis-on subcutaneous heparin    All the records are reviewed and case discussed with ED provider. Management plans discussed with the patient, family and they are in agreement.  CODE STATUS: Full Code  TOTAL TIME TAKING CARE OF THIS PATIENT: 50 minutes.    Gladstone Lighter M.D on 03/27/2016 at 7:23 PM  Between 7am to 6pm -  Pager - [phone]  After 6pm go to [email or web address] - password [email or web address] [facility]  [facility]  Office  [phone]  CC: Primary care physician; No PCP Per Patient

## 2016-03-27 NOTE — Progress Notes (Signed)
[facility] day:  03/27/2016  Chief Complaint: Gerald [name] is a 65 y.o. male with leukocytosis, eosinophilia, and a lung mass who is referred in consultation by Dr. Nance Pear for assessment and management.  HPI:  Prior to recent events, the patient last saw a physician approximately 13-14 years ago.  At that time, he was seen by rheumatology at [facility].  He initially had joint complaints in his hands.  He was offered treatment with Remicade, but did not receive treatment.  He did receive MTX in the past.  Since that time, he has had progressive arthritis symptoms in his hands and other joints.  He has been on disability.  He notes a 13-15 pack year smoking history. He smoked for 26 years and stopped smoking 15 years ago.  He was in his usual state of good health until early 12/2015 when he developed flu like symptoms.  He describes diffuse aches, fever 102 with chills, cough, runny nose, and slight nausea.  His symptoms resolved except his cough and poor appetite.  His daughter also notes some hoarseness.  He states that his energy level has continued to be low.  He has had some shortness of breath.  He denies any chest pain.  Since that time, he has lost 30 pounds.  On 03/25/2016, while fishing, he developed cramping in his left calf.  Pain was severe.  He had difficulty walking.  His family pushed him to seek medical attention.  He presented to the ER on 03/25/2016.  Left lower extremity duplex on 03/25/2016 revealed no evidence of DVT.  CXR on 03/26/2016 revealed a 5-6 cm central right lung mass suspicious for primary lung cancer.  There was right paratracheal adenopathy and probable sub-carinal adenopathy.    Labs revealed a hematocrit of 40.7, hemoglobin 13.5, MCV 87, platelets 210,000, WBC 72,300 with an ANC of 28,200.  Differential revealed 37% neutrophils, 2% lymphocytes, 5% monocytes, 53% eosinophils, and 1% basophils.  Absolute eosinophil  count was 38,300.  BMP revealed a creatinine of 0.89.  LDH was 204.  Uric acid was 5.0.  Symptomatically, he notes that the pain in his left leg is now a 1 of out of 10 (nagging discomfort) instead of 9 that he experienced on 03/25/2016.  He states that he has been eating more lately.  He continues to have a cough.  He has had 1-2 episodes of scant hemoptysis.  He notes a little numbness in his left foot.     Past Medical History  Diagnosis Date  . Arthritis   . Pneumothorax     spontaneous  . Renal disorder     No past surgical history on file.  No family history on file.  Social History:  reports that he quit smoking about 8 weeks ago. His smoking use included Cigarettes. He has a 13 pack-year smoking history. He does not have any smokeless tobacco history on file. He reports that he drinks alcohol. His drug history is not on file.  He lives in [place].  Contact number is [phone].  The patient is accompanied by his daughter, Gerald [name], today.  Allergies:  Allergies  Allergen Reactions  . Ibuprofen   . Sulfa Antibiotics     Current Medications: No current outpatient prescriptions on file.   No current facility-administered medications for this visit.    Review of Systems:  GENERAL:  Fatigue.  No fevers or sweats.  Weight loss of 30 pounds since 12/2015. PERFORMANCE  STATUS (ECOG):  1 HEENT:  Blurry vision if goes outside.  Last eye check 8-10 years ago.  Pollen allergy.  No sore throat, mouth sores or tenderness. Lungs:  Shortness of breath.  Cough productive of clear/white/gray sputum.  Scant hemoptysis x 1. Cardiac:  No chest pain, palpitations, orthopnea, or PND.  Sleeps in a recliner. GI:  Poor appetite which has improved recently.  No nausea, vomiting, diarrhea, constipation, melena or hematochezia. GU:  No urgency, frequency, dysuria, or hematuria. Musculoskeletal:  No back pain.  No joint pain.  No muscle tenderness. Extremities:  No pain or swelling. Skin:  No  rashes or skin changes. Neuro:  Seldom headache.  Little numbness bottom of left foot extends upward slightly (sock distribution). No weakness, balance or coordination issues. Endocrine:  No diabetes, thyroid issues, hot flashes or night sweats. Psych:  No mood changes, depression or anxiety. Pain:  No focal pain. Review of systems:  All other systems reviewed and found to be negative.  Physical Exam: Blood pressure 103/67, pulse 106, temperature 98.3 F (36.8 C), weight 132 lb 4.4 oz (60 kg), SpO2 96 %. GENERAL:  Thin gentleman sitting comfortably in the exam room in no acute distress. MENTAL STATUS:  Alert and oriented to person, place and time. HEAD:  Long gray hair in pony tail.  Goatee.  Temporal wasting.  Normocephalic, atraumatic, face symmetric, no Cushingoid features. EYES:  Blue eyes.  Pupils equal round and reactive to light and accomodation.  No conjunctivitis or scleral icterus. ENT:  Oropharynx clear without lesion.  Edentulous except 1 tooth.  Tongue normal. Mucous membranes moist.  RESPIRATORY:  Clear to auscultation without rales, wheezes or rhonchi. CARDIOVASCULAR:  Intermittent irregular rate and rhythm without murmur, rub or gallop. ABDOMEN:  Soft, non-tender, with active bowel sounds, and no splenomegaly.  Left upper quadrant full.  No masses. SKIN:  No urticaria pigmentosa.  Negative Darier's sign.  No rashes, ulcers or lesions. EXTREMITIES: No edema, no skin discoloration or tenderness.  No palpable cords. LYMPH NODES: No palpable cervical, supraclavicular, axillary or inguinal adenopathy  NEUROLOGICAL: Alert & oriented, cranial nerves II-XII intact; motor strength 5/5 throughout; sensation intact; finger to nose and RAM normal; able to walk heel to toe; negative Rhomberg; no clonus or Babinski. PSYCH:  Appropriate.   Admission on 03/26/2016, Discharged on 03/26/2016  Component Date Value Ref Range Status  . WBC 03/25/2016 72.3* 3.8 - 10.6 K/uL Final   Comment:  RESULT REPEATED AND VERIFIED CRITICAL RESULT CALLED TO, READ BACK BY AND VERIFIED WITH: ANN CALES AT 2208 03/25/16.PMH   . RBC 03/25/2016 4.69  4.40 - 5.90 MIL/uL Final  . Hemoglobin 03/25/2016 13.5  13.0 - 18.0 g/dL Final  . HCT 03/25/2016 40.7  40.0 - 52.0 % Final  . MCV 03/25/2016 87.0  80.0 - 100.0 fL Final  . MCH 03/25/2016 28.7  26.0 - 34.0 pg Final  . MCHC 03/25/2016 33.0  32.0 - 36.0 g/dL Final  . RDW 03/25/2016 15.3* 11.5 - 14.5 % Final  . Platelets 03/25/2016 210  150 - 440 K/uL Final  . Sodium 03/25/2016 133* 135 - 145 mmol/L Final  . Potassium 03/25/2016 4.1  3.5 - 5.1 mmol/L Final  . Chloride 03/25/2016 103  101 - 111 mmol/L Final  . CO2 03/25/2016 20* 22 - 32 mmol/L Final  . Glucose, Bld 03/25/2016 124* 65 - 99 mg/dL Final  . BUN 03/25/2016 12  6 - 20 mg/dL Final  . Creatinine, Ser 03/25/2016 0.89  0.61 -  1.24 mg/dL Final  . Calcium 03/25/2016 8.6* 8.9 - 10.3 mg/dL Final  . GFR calc non Af Amer 03/25/2016 >60  >60 mL/min Final  . GFR calc Af Amer 03/25/2016 >60  >60 mL/min Final   Comment: (NOTE) The eGFR has been calculated using the CKD EPI equation. This calculation has not been validated in all clinical situations. eGFR's persistently <60 mL/min signify possible Chronic Kidney Disease.   . Anion gap 03/25/2016 10  5 - 15 Final  . Neutro Abs 03/25/2016 28.2* 1.4 - 6.5 K/uL Final  . Lymphs Abs 03/25/2016 1.5  1.0 - 3.6 K/uL Final  . Monocytes Absolute 03/25/2016 3.6* 0.2 - 1.0 K/uL Final  . Eosinophils Absolute 03/25/2016 38.3* 0 - 0.7 K/uL Final  . Basophils Absolute 03/25/2016 0.7* 0 - 0.1 K/uL Final  . Neutrophils Relative % 03/25/2016 37   Final  . Lymphocytes Relative 03/25/2016 2   Final  . Monocytes Relative 03/25/2016 5   Final  . Eosinophils Relative 03/25/2016 53   Final  . Basophils Relative 03/25/2016 1   Final  . Band Neutrophils 03/25/2016 2   Final  . Metamyelocytes Relative 03/25/2016 0   Final  . Myelocytes 03/25/2016 0   Final  .  Promyelocytes Absolute 03/25/2016 0   Final  . Blasts 03/25/2016 0   Final  . nRBC 03/25/2016 0  0 /100 WBC Final  . Other 03/25/2016 0   Final  . WBC Morphology 03/25/2016 VACUOLATED NEUTROPHILS   Final  . Smear Review 03/25/2016 LARGE PLATELETS PRESENT   Final  . Uric Acid, Serum 03/26/2016 5.0  4.4 - 7.6 mg/dL Final  . LDH 03/26/2016 204* 98 - 192 U/L Final    Assessment:  Gerald [name] is a 65 y.o. male with leukocytosis with eosinophilia and a right middle lobe mass.  CXR on 03/26/2016 revealed a 5-6 cm central right lung mass suspicious for primary lung cancer.  There was right paratracheal adenopathy and probable sub-carinal adenopathy.  He has a 13-15 pack year smoking history.  He has marked leukocytosis with hypereosinophilia worrisome for a myeloproliferative neoplasm.  Eosinophilia can be associated with lung cancer (large cell or adenocarcinoma) as well as lymphoma.  He has no skin, gastrointestinal, or rheumatologic conditions.  He has no wheezing.  He has shortness of breath likely due to his right chest mass.  He has no wheezing.  He has lost 30 pounds in the past 4 months.  LDH and uric acid are normal.  He has unexplained left leg discomfort. Left lower extremity duplex on 03/25/2016 revealed no evidence of DVT.  He may have a peripheral neuropathy secondary to his hypereosinophilia.  Plan: 1.  Review labs and imaging studies with patient.  Discuss concern for primary lung cancer.  Discuss chest CT with contrast and biopsy.  Discuss PET scan.  Preliminary discussions regarding staging and treatment. 2.  Discussed marked leukocytosis and eosinophilia possibly secondary to lung cancer, but concern for hypereosinophilia syndrome given markedly elevated counts.  Discuss work-up with labs, EKG, and echo.  Discuss possible bone marrow. 3.  Labs today:  CBC with diff, CMP, B12, tryptase, SPEP, immunoglobulin levels, HIV antibody, HTLV I and II, troponin, flow cytometry with T cell  subsets. 4.  Peripheral smear for path review. 5.  EKG. 6.  Chest CT with contrast:  assess lung mass. 7.  RTC end of week to discuss work-up and direction of therapy.  Addendum:  EKG revealed biatrial enlargement without arrhythmia.  Lab  called with elevated troponin.  Patient directed to the ER for anticipated admission.    Review of peripheral smear reveals marked eosinophilia with increased neutophils, basophils, monocytes, and a few myelocytes.  A myeloproliferative neoplasm is suspected.  Additional testing will be sent including the panel for myeloproliferative neoplasm with hypereosinophilia, BCR-ABL, and JAK2.   Lequita Asal, MD  03/27/2016, 11:31 AM

## 2016-03-27 NOTE — ED Provider Notes (Signed)
[facility] Emergency Department Provider Note  Time seen: 4:42 PM  I have reviewed the triage vital signs and the nursing notes.   HISTORY  Chief Complaint Abnormal Lab    HPI Gerald [name] is a 65 y.o. male with a past medical history of arthritis,recently diagnosed lung mass, presents the emergency department for an elevated troponin. According to the patient and record review the patient was seen in the emergency department 4/15 for left leg cramps. During his workup the patient had a chest x-ray showing a lung mass, and a significantly elevated white blood cell count. Patient was sent to the [facility], had a follow-up appointment today during the routine bloodwork a troponin was checked which resulted elevated as well as a significant leukocytosis of 84,000 and the patient was referred to the emergency department for further workup. Patient denies any chest pain now or anytime recently. Does state an occasional cough but denies any fever or sputum production. Patient used to smoke cigarettes but quit approximately 15 years ago. States the left leg pain has largely resolved although will occasionally cramp on him if he lies flat.     Past Medical History  Diagnosis Date  . Arthritis   . Pneumothorax     spontaneous  . Renal disorder   . Mass of lung     Patient Active Problem List   Diagnosis Date Noted  . Mass of middle lobe of right lung 03/27/2016  . Eosinophilia 03/27/2016  . Leukocytosis 03/27/2016    History reviewed. No pertinent past surgical history.  No current outpatient prescriptions on file.  Allergies Ibuprofen and Sulfa antibiotics  No family history on file.  Social History Social History  Substance Use Topics  . Smoking status: Former Smoker -- 0.50 packs/day for 26 years    Types: Cigarettes    Quit date: 01/31/2016  . Smokeless tobacco: None  . Alcohol Use: Yes     Comment: occasionally    Review of  Systems Constitutional: Negative for fever Cardiovascular: Negative for chest pain. Respiratory: Negative for shortness of breath.Occasional cough. Gastrointestinal: Negative for abdominal pain Musculoskeletal: Negative for back pain. Neurological: Negative for headaches 10-point ROS otherwise negative.  ____________________________________________   PHYSICAL EXAM:  VITAL SIGNS: ED Triage Vitals  Enc Vitals Group     BP 03/27/16 1514 114/69 mmHg     Pulse Rate 03/27/16 1514 101     Resp 03/27/16 1514 20     Temp 03/27/16 1514 98 F (36.7 C)     Temp Source 03/27/16 1514 Oral     SpO2 03/27/16 1514 96 %     Weight --      Height --      Head Cir --      Peak Flow --      Pain Score 03/27/16 1546 2     Pain Loc --      Pain Edu? --      Excl. in [place]? --     Constitutional: Alert and oriented. Well appearing and in no distress. Eyes: Normal exam ENT   Head: Normocephalic and atraumatic   Mouth/Throat: Mucous membranes are moist. Cardiovascular: Normal rate, regular rhythm. No murmur Respiratory: Normal respiratory effort without tachypnea nor retractions. Breath sounds are clear  Gastrointestinal: Soft and nontender. No distention.   Musculoskeletal: Nontender with normal range of motion in all extremities. Neurologic:  Normal speech and language. No gross focal neurologic deficits Skin:  Skin is warm, dry and intact.  Psychiatric: Mood and affect are normal. Speech and behavior are normal.   ____________________________________________    EKG  EKG reviewed and interpreted by myself shows sinus rhythm at 99 bpm, narrow QRS, normal axis, frequent PVCs. Normal intervals, no concerning ST changes noted.   INITIAL IMPRESSION / ASSESSMENT AND PLAN / ED COURSE  Pertinent labs & imaging results that were available during my care of the patient were reviewed by me and considered in my medical decision making (see chart for details).  Patient referred to the  emergency department after a visit at the cancer center in which an elevated troponin was found during a workup. Patient denies any chest pain, nausea or diaphoresis. Denies shortness of breath. Denies any pleuritic chest pain. We will repeat a troponin. Given the elevated troponin with recent left leg pain/cramping we'll obtain a CT angiography to rule out PE and also further evaluate the chest mass.  CT scan shows an 8 cm right lung mass, likely bronchogenic carcinoma, unfortunate appears to be abutting versus invading the left atrium of the heart. There is also a 2.6 cm mass in the liver concerning for metastatic spread. I discussed these results with his oncologist Dr.Corcoran, he states she did a blood smear today showing a very large amount of eosinophils. She wishes for the patient to be admitted to the hospital for an expedient workup. I discussed this with the patient as well as a CT results, patient is reluctantly agreeable to admission.  ____________________________________________   FINAL CLINICAL IMPRESSION(S) / ED DIAGNOSES  Lung mass Eosinophilia   Harvest Dark, MD 03/27/16 1840

## 2016-03-27 NOTE — ED Notes (Signed)
Assisted patient up to bathroom and back into bed. Pt up with steady gait.

## 2016-03-27 NOTE — ED Notes (Signed)
Hospitalist at bedside 

## 2016-03-27 NOTE — ED Notes (Signed)
Pt sent over from Cancer center for further eval of abnormal labs. Pt was seen here on Saturday and found out he had a spot on his  Lung, possibly cancer.

## 2016-03-28 ENCOUNTER — Other Ambulatory Visit: Payer: Self-pay

## 2016-03-28 ENCOUNTER — Inpatient Hospital Stay (HOSPITAL_COMMUNITY)
Admit: 2016-03-28 | Discharge: 2016-03-28 | Disposition: A | Discharge status: Home / Self Care | Payer: Medicare Other | Attending: Internal Medicine | Admitting: Internal Medicine

## 2016-03-28 DIAGNOSIS — Z87891 Personal history of nicotine dependence: Secondary | ICD-10-CM

## 2016-03-28 DIAGNOSIS — R634 Abnormal weight loss: Secondary | ICD-10-CM

## 2016-03-28 DIAGNOSIS — I5189 Other ill-defined heart diseases: Secondary | ICD-10-CM | POA: Insufficient documentation

## 2016-03-28 DIAGNOSIS — D721 Eosinophilia: Secondary | ICD-10-CM

## 2016-03-28 DIAGNOSIS — D479 Neoplasm of uncertain behavior of lymphoid, hematopoietic and related tissue, unspecified: Secondary | ICD-10-CM

## 2016-03-28 DIAGNOSIS — I998 Other disorder of circulatory system: Secondary | ICD-10-CM

## 2016-03-28 DIAGNOSIS — R059 Cough, unspecified: Secondary | ICD-10-CM | POA: Insufficient documentation

## 2016-03-28 DIAGNOSIS — R5383 Other fatigue: Secondary | ICD-10-CM

## 2016-03-28 DIAGNOSIS — R918 Other nonspecific abnormal finding of lung field: Secondary | ICD-10-CM

## 2016-03-28 DIAGNOSIS — R05 Cough: Secondary | ICD-10-CM | POA: Insufficient documentation

## 2016-03-28 DIAGNOSIS — M069 Rheumatoid arthritis, unspecified: Secondary | ICD-10-CM

## 2016-03-28 DIAGNOSIS — R630 Anorexia: Secondary | ICD-10-CM

## 2016-03-28 DIAGNOSIS — K769 Liver disease, unspecified: Secondary | ICD-10-CM

## 2016-03-28 DIAGNOSIS — D72829 Elevated white blood cell count, unspecified: Secondary | ICD-10-CM

## 2016-03-28 DIAGNOSIS — R7989 Other specified abnormal findings of blood chemistry: Secondary | ICD-10-CM

## 2016-03-28 DIAGNOSIS — E43 Unspecified severe protein-calorie malnutrition: Secondary | ICD-10-CM

## 2016-03-28 DIAGNOSIS — F17200 Nicotine dependence, unspecified, uncomplicated: Secondary | ICD-10-CM

## 2016-03-28 DIAGNOSIS — R599 Enlarged lymph nodes, unspecified: Secondary | ICD-10-CM

## 2016-03-28 DIAGNOSIS — R222 Localized swelling, mass and lump, trunk: Secondary | ICD-10-CM

## 2016-03-28 DIAGNOSIS — M79605 Pain in left leg: Secondary | ICD-10-CM

## 2016-03-28 DIAGNOSIS — R778 Other specified abnormalities of plasma proteins: Secondary | ICD-10-CM | POA: Insufficient documentation

## 2016-03-28 LAB — PROTEIN ELECTROPHORESIS, SERUM
A/G Ratio: 0.6 — ABNORMAL LOW (ref 0.7–1.7)
Albumin ELP: 3 g/dL (ref 2.9–4.4)
Alpha-1-Globulin: 0.3 g/dL (ref 0.0–0.4)
Alpha-2-Globulin: 0.6 g/dL (ref 0.4–1.0)
Beta Globulin: 1.2 g/dL (ref 0.7–1.3)
Gamma Globulin: 2.9 g/dL — ABNORMAL HIGH (ref 0.4–1.8)
Globulin, Total: 5.1 g/dL — ABNORMAL HIGH (ref 2.2–3.9)
Total Protein ELP: 8.1 g/dL (ref 6.0–8.5)

## 2016-03-28 LAB — CBC WITH DIFFERENTIAL/PLATELET
Basophils Absolute: 0.7 10*3/uL — ABNORMAL HIGH (ref 0–0.1)
Basophils Relative: 1 %
Eosinophils Absolute: 36.3 10*3/uL — ABNORMAL HIGH (ref 0–0.7)
Eosinophils Relative: 51 %
HCT: 37 % — ABNORMAL LOW (ref 40.0–52.0)
Hemoglobin: 11.9 g/dL — ABNORMAL LOW (ref 13.0–18.0)
Lymphocytes Relative: 5 %
Lymphs Abs: 3.6 10*3/uL (ref 1.0–3.6)
MCH: 28.6 pg (ref 26.0–34.0)
MCHC: 32.1 g/dL (ref 32.0–36.0)
MCV: 88.9 fL (ref 80.0–100.0)
Monocytes Absolute: 2.8 10*3/uL — ABNORMAL HIGH (ref 0.2–1.0)
Monocytes Relative: 4 %
Neutro Abs: 27.7 10*3/uL — ABNORMAL HIGH (ref 1.4–6.5)
Neutrophils Relative %: 39 %
Platelets: 232 10*3/uL (ref 150–440)
RBC: 4.16 MIL/uL — ABNORMAL LOW (ref 4.40–5.90)
RDW: 15.1 % — ABNORMAL HIGH (ref 11.5–14.5)
WBC: 71.1 10*3/uL (ref 3.8–10.6)

## 2016-03-28 LAB — BASIC METABOLIC PANEL
Anion gap: 4 — ABNORMAL LOW (ref 5–15)
BUN: 9 mg/dL (ref 6–20)
CALCIUM: 8.2 mg/dL — AB (ref 8.9–10.3)
CHLORIDE: 106 mmol/L (ref 101–111)
CO2: 24 mmol/L (ref 22–32)
CREATININE: 0.95 mg/dL (ref 0.61–1.24)
GFR calc non Af Amer: >60 mL/min (ref >60)
Glucose, Bld: 86 mg/dL (ref 65–99)
Potassium: 4 mmol/L (ref 3.5–5.1)
SODIUM: 134 mmol/L — AB (ref 135–145)

## 2016-03-28 LAB — ECHOCARDIOGRAM COMPLETE
Height: 71 in
WEIGHTICAEL: 2097.6 [oz_av]

## 2016-03-28 LAB — MISC LABCORP TEST (SEND OUT)
LabCorp test name: 505750
Labcorp test code: 505750

## 2016-03-28 LAB — TROPONIN I
Troponin I: 0.06 ng/mL — ABNORMAL HIGH (ref <0.031)
Troponin I: 0.08 ng/mL — ABNORMAL HIGH (ref <0.031)

## 2016-03-28 LAB — APTT: aPTT: 38 seconds — ABNORMAL HIGH (ref 24–36)

## 2016-03-28 LAB — MAGNESIUM: Magnesium: 1.8 mg/dL (ref 1.7–2.4)

## 2016-03-28 LAB — CEA: CEA: 2.3 ng/mL (ref 0.0–4.7)

## 2016-03-28 LAB — PROTIME-INR
INR: 1.27
Prothrombin Time: 16 seconds — ABNORMAL HIGH (ref 11.4–15.0)

## 2016-03-28 MED ORDER — ENOXAPARIN SODIUM 40 MG/0.4ML ~~LOC~~ SOLN
40.0000 mg | SUBCUTANEOUS | Status: DC
  Administered 2016-03-29 – 2016-03-30 (×2): 40 mg via SUBCUTANEOUS
  Filled 2016-03-28 (×2): qty 0.4

## 2016-03-28 MED ORDER — SODIUM CHLORIDE 0.9 % IV SOLN
INTRAVENOUS | Status: DC
  Administered 2016-03-29: 06:00:00 via INTRAVENOUS

## 2016-03-28 MED ORDER — ENSURE ENLIVE PO LIQD
237.0000 mL | Freq: Two times a day (BID) | ORAL | Status: DC
  Administered 2016-03-29 – 2016-03-31 (×3): 237 mL via ORAL

## 2016-03-28 NOTE — Care Management Note (Signed)
Case Management Note  Patient Details  Name: Gerald [name] MRN: [id number] Date of Birth: 11-13-1951  Subjective/Objective:    Patient admitted with elevated troponin and lung mass.  RNCM placed due to patient having concerns of being hospitalized and Medicare.  I attempted to completed assessment on patient.  At that time family was present, and patient was eating dinner.  Patient stated "i haven't eaten in 2 days, can you come back later".  Upon chart review it appears that the patient has a history of rheumatoid arthritis, does not take any medications, and does not have a PCP.  Per pulmonary consult  Right upper lobe mass highly suspicious for malignancy.                 Action/Plan: RNCM to complete assessment and follow for discharge needs  Expected Discharge Date:                  Expected Discharge Plan:     In-House Referral:     Discharge planning Services     Post Acute Care Choice:    Choice offered to:     DME Arranged:    DME Agency:     HH Arranged:    [place] Agency:     Status of Service:     Medicare Important Message Given:    Date Medicare IM Given:    Medicare IM give by:    Date Additional Medicare IM Given:    Additional Medicare Important Message give by:     If discussed at [place] of Stay Meetings, dates discussed:    Additional Comments:  Beverly Sessions, RN 03/28/2016, 3:09 PM

## 2016-03-28 NOTE — Progress Notes (Signed)
°   03/28/16 1500  Clinical Encounter Type  Visited With Patient and family together  Visit Type Initial  Referral From Other (Comment)  Pastoral care visit. [place]

## 2016-03-28 NOTE — Progress Notes (Signed)
Patient is scheduled for a bone marrow biopsy today. Attempted to get consent from patient and he stated he would like to talk to the doctor first. Patient is open to having it done, he just would like to be informed of the procedure and the process of it before hand. Secretary informed cancer center when consult was called that patient wants to see the MD first.

## 2016-03-28 NOTE — Consult Note (Signed)
PULMONARY / CRITICAL CARE MEDICINE   Name: Gerald [name] MRN: [id number] DOB: [birth year]    ADMISSION DATE:  03/27/2016 CONSULTATION DATE:  03/28/16  REFERRING MD :  Dr. Mike Gip   CHIEF COMPLAINT:     Cough, left leg pain   HISTORY OF PRESENT ILLNESS   65 year old male past medical history of rheumatoid arthritis, spontaneous pneumothorax, seen in consultation for right upper lobe lung mass. History per patient. He presented originally for left leg pain 2 days prior, at that time he also complained of a persistent cough since January. Chest x-ray showed a possible right upper lobe mass, this was followed up with a CT chest that showed a 8 cm right upper lobe mass invading adjacent structures. He has a history of smoking, quit 16 years ago, prior smoke 1 pack per day 24 years. He states that January he had a questionable flulike illness, with cough and intermittent fevers, this resolved however the cough persisted, cough is clear, with intermittent episodes of blood streaks or pink tinged sputum. He states he is fairly skinny and has noticed a significant amount of weight loss over the last 3-4 months, about 30 pounds He has not follow-up with medical professionals in about 15 years. He denies any further headaches, chills, dizziness, chest pain, palpitations,. He states he has occasional upset stomach. Cardiac workup showed elevated troponin of 0.11, and is currently awaiting further cardiology evaluation.    SIGNIFICANT EVENTS   4/18 CT chest>>Centimeter mass medial right upper lobe, 2.6 cm enhancing lesion in the medial segment of the left hepatic lobe   PAST MEDICAL HISTORY    :  Past Medical History  Diagnosis Date  . Arthritis     Rheumatoid arthritis  . Pneumothorax     spontaneous  . Renal disorder     as a child  . Mass of lung   . Collagen vascular disease ([place])     RA   Past Surgical History  Procedure Laterality Date  . Ureter repair as a child     Prior  to Admission medications   Not on File   Allergies  Allergen Reactions  . Nsaids Other (See Comments)    Pt is unable to take this class of medications.    . Sulfa Antibiotics Other (See Comments)    Reaction:  GI upset      FAMILY HISTORY   Family History  Problem Relation Age of Onset  . Brain cancer Mother   . Lung cancer Maternal Uncle   . Cancer Sister     Metastatic cancer- unknown primary      SOCIAL HISTORY    reports that he quit smoking about 8 weeks ago. His smoking use included Cigarettes. He has a 13 pack-year smoking history. He does not have any smokeless tobacco history on file. He reports that he drinks alcohol. He reports that he does not use illicit drugs.  Review of Systems  Constitutional: Positive for weight loss and malaise/fatigue. Negative for fever and chills.  HENT: Negative for hearing loss.   Eyes: Negative for blurred vision and double vision.  Respiratory: Positive for cough, hemoptysis and sputum production. Negative for shortness of breath and wheezing.   Cardiovascular: Positive for claudication. Negative for chest pain, palpitations, orthopnea and leg swelling.  Gastrointestinal: Negative for heartburn, nausea, vomiting, constipation, blood in stool and melena.  Genitourinary: Negative for dysuria.  Musculoskeletal: Positive for myalgias.  Skin: Negative for rash.  Neurological: Negative for dizziness and headaches.  Endo/Heme/Allergies: Does not bruise/bleed easily.  Psychiatric/Behavioral: Negative for depression.      VITAL SIGNS    Temp:  [98 F (36.7 C)-98.7 F (37.1 C)] 98.3 F (36.8 C) (04/18 1200) Pulse Rate:  [88-108] 88 (04/18 1200) Resp:  [18-24] 18 (04/18 1200) BP: (98-128)/(43-84) 103/64 mmHg (04/18 1200) SpO2:  [96 %-100 %] 100 % (04/18 1200) Weight:  [131 lb 1.6 oz (59.467 kg)] 131 lb 1.6 oz (59.467 kg) (04/17 2059) HEMODYNAMICS:   VENTILATOR SETTINGS:   INTAKE / OUTPUT:  Intake/Output Summary (Last 24  hours) at 03/28/16 1456 Last data filed at 03/28/16 0500  Gross per 24 hour  Intake      0 ml  Output    450 ml  Net   -450 ml       PHYSICAL EXAM   Physical Exam  Constitutional: He is oriented to person, place, and time.  Cachectic appearing male in no acute respiratory distress  HENT:  Head: Normocephalic and atraumatic.  Right Ear: External ear normal.  Nose: Nose normal.  Mouth/Throat: Oropharynx is clear and moist.  Eyes: Conjunctivae and EOM are normal. Pupils are equal, round, and reactive to light.  Neck: Normal range of motion. Neck supple. No JVD present. No tracheal deviation present. No thyromegaly present.  Cardiovascular: Normal rate, regular rhythm, normal heart sounds and intact distal pulses.   No murmur heard. Pulmonary/Chest: Effort normal and breath sounds normal. No stridor. No respiratory distress. He has no wheezes. He has no rales. He exhibits no tenderness.  Abdominal: Soft. Bowel sounds are normal. He exhibits no distension. There is no tenderness. There is no rebound.  Musculoskeletal: Normal range of motion. He exhibits no edema.  Lymphadenopathy:    He has no cervical adenopathy.  Neurological: He is alert and oriented to person, place, and time.  Skin: Skin is warm and dry.  Psychiatric: He has a normal mood and affect.  Nursing note and vitals reviewed.      LABS   LABS:  CBC  Recent Labs Lab 03/25/16 2134 03/27/16 1303 03/28/16 0436  WBC 72.3* 84.4* 71.1*  HGB 13.5 12.9* 11.9*  HCT 40.7 39.5* 37.0*  PLT 210 237 232   Coag's  Recent Labs Lab 03/28/16 0436  APTT 38*  INR 1.27   BMET  Recent Labs Lab 03/25/16 2134 03/27/16 1303 03/28/16 0424  NA 133* 132* 134*  K 4.1 3.8 4.0  CL 103 102 106  CO2 20* 25 24  BUN '12 12 9  '$ CREATININE 0.89 0.86 0.95  GLUCOSE 124* 88 86   Electrolytes  Recent Labs Lab 03/25/16 2134 03/27/16 1303 03/28/16 0424  CALCIUM 8.6* 8.2* 8.2*  MG  --   --  1.8   Sepsis Markers No  results for input(s): LATICACIDVEN, PROCALCITON, O2SATVEN in the last 168 hours. ABG No results for input(s): PHART, PCO2ART, PO2ART in the last 168 hours. Liver Enzymes  Recent Labs Lab 03/27/16 1303  AST 30  ALT 28  ALKPHOS 85  BILITOT 0.8  ALBUMIN 3.1*   Cardiac Enzymes  Recent Labs Lab 03/27/16 2103 03/28/16 0131 03/28/16 0424  TROPONINI 0.07* 0.06* 0.08*   Glucose No results for input(s): GLUCAP in the last 168 hours.   No results found for this or any previous visit (from the past 240 hour(s)).   Current facility-administered medications:  .  0.9 %  sodium chloride infusion, , Intravenous, Continuous, Gladstone Lighter, MD, Last Rate: 75 mL/hr at 03/28/16 1118 .  acetaminophen (TYLENOL) tablet 650  mg, 650 mg, Oral, Q6H PRN **OR** acetaminophen (TYLENOL) suppository 650 mg, 650 mg, Rectal, Q6H PRN, Gladstone Lighter, MD .  enoxaparin (LOVENOX) injection 40 mg, 40 mg, Subcutaneous, Q24H, Vaughan Basta, MD .  feeding supplement (ENSURE ENLIVE) (ENSURE ENLIVE) liquid 237 mL, 237 mL, Oral, BID BM, Vaughan Basta, MD .  ondansetron (ZOFRAN) tablet 4 mg, 4 mg, Oral, Q6H PRN **OR** ondansetron (ZOFRAN) injection 4 mg, 4 mg, Intravenous, Q6H PRN, Gladstone Lighter, MD .  oxyCODONE (Oxy IR/ROXICODONE) immediate release tablet 5 mg, 5 mg, Oral, Q4H PRN, Gladstone Lighter, MD .  pneumococcal 23 valent vaccine (PNU-IMMUNE) injection 0.5 mL, 0.5 mL, Intramuscular, Tomorrow-1000, Gladstone Lighter, MD .  sodium chloride flush (NS) 0.9 % injection 3 mL, 3 mL, Intravenous, Q12H, Gladstone Lighter, MD, 3 mL at 03/27/16 2150  IMAGING    Ct Angio Chest Pe W/cm &/or Wo Cm  03/27/2016  CLINICAL DATA:  Abnormal chest radiograph with lung mass, elevated troponin and tachycardia. EXAM: CT ANGIOGRAPHY CHEST WITH CONTRAST TECHNIQUE: Multidetector CT imaging of the chest was performed using the standard protocol during bolus administration of intravenous contrast. Multiplanar CT  image reconstructions and MIPs were obtained to evaluate the vascular anatomy. CONTRAST:  Study 5 mL Isovue 370 IV COMPARISON:  Chest radiographs dated 03/26/2016 FINDINGS: No evidence of pulmonary embolism. Pulmonary artery leading to the right middle lobe is attenuated by the dominant mass (described below). Mediastinum/Nodes: The heart is normal in size. No pericardial effusion. Three vessel coronary atherosclerosis. Thoracic lymphadenopathy, including: --1.6 cm short axis high right paratracheal node (series 4/image 45) --2.3 cm short axis right paratracheal node (series 4/image 57) --1.5 cm short axis AP window node (series 4/image 60) --2.3 cm short axis low right paratracheal node (series 4/image 63) --1.9 cm short axis subcarinal node (series 4/image 76) Visualized thyroid is unremarkable. Lungs/Pleura: 8.0 x 4.7 x 5.7 cm mass in the medial right upper lobe (series 4/ image 78). Mass occludes a branch of the right upper lobe bronchus (series 6/ image 70) and narrows the right middle lobe bronchus (series 6/ image 88). Mass abuts the mediastinum and may indicate the left atrium, possibly via a pulmonary vein (series 4/ image 89). Increased interstitial markings with satellite nodularity in the right upper lobe measuring up to 10 x 14 mm (series 6/image 83), metastatic disease not excluded. Additional increased interstitial markings with volume loss in the right middle lobe (series 6/image 90), possibly reflecting postobstructive opacity. However, a 7 mm nodule is present in the right middle lobe (series 6/ image 98), and lymphangitic spread with additional tumor is not excluded. Biapical pleural-parenchymal scarring, right greater than left. Underlying mild centrilobular and paraseptal emphysematous changes with bullous changes of the left lung apex. Trace left pleural effusion. Mild dependent atelectasis in the left lower lobe. No pneumothorax. Upper abdomen: Suspected 2.6 cm enhancing lesion in the medial  segment left hepatic lobe (series 4/image 151), worrisome for hepatic metastasis. Heterogeneous perfusion with scarring in the medial left upper kidney, likely reflecting sequela of prior infarct or infection. Multiple wedge-shaped infarcts of the spleen, likely reflecting prior vascular insults. Musculoskeletal: No focal osseous lesions. Review of the MIP images confirms the above findings. IMPRESSION: No evidence of pulmonary embolism. 8.0 cm mass in the medial right upper lobe, as described above, suspicious for primary bronchogenic neoplasm. Mass abuts the mediastinum and possibly invades the left atrium. Associated widespread thoracic lymphadenopathy, as above. 2.6 cm enhancing lesion in the medial segment left hepatic lobe, worrisome for hepatic metastasis. Trace  left pleural effusion. Electronically Signed   By: [name] Hy M.D.   On: 03/27/2016 18:05      Indwelling Urinary Catheter continued, requirement due to   Reason to continue Indwelling Urinary Catheter for strict Intake/Output monitoring for hemodynamic instability   Central Line continued, requirement due to   Reason to continue [employer] Monitoring of central venous pressure or other hemodynamic parameters   Ventilator continued, requirement due to, resp failure    Ventilator Sedation RASS 0 to -2   Cultures: BCx2  UC  Sputum  Antibiotics:  Lines:   ASSESSMENT/PLAN  65 year old male past medical history of spontaneous pneumothorax, seen in consultation for right upper lobe mass  A: Right upper lobe mass History of tobacco use Elevated troponin Leukocytosis  Plan: -Patient placed tentatively on the bronchoscopy schedule for Thursday at 1 PM pending cardiology clearance -Incentive spirometer, flutter valve -Bronchoscopy discussed with patient, along with biopsy, patient in agreement -Right upper lobe mass highly suspicious for malignancy -Continue trending cardiac enzymes, follow-up echo  I have  personally obtained a history, examined the patient, evaluated laboratory and imaging results, formulated the assessment and plan and placed orders.  Pulmonary Care Time devoted to patient care services described in this note is 45 minutes.   Vilinda Boehringer, MD [facility] Pager [phone] (please enter 7-digits) On Call Pager [phone] (please enter 7-digits)     03/28/2016, 2:56 PM  Note: This note was prepared with Dragon dictation along with smaller phrase technology. Any transcriptional errors that result from this process are unintentional.

## 2016-03-28 NOTE — Progress Notes (Signed)
Informed by [place] in specials that biopsy cannot be performed today. Dr. Anselm Jungling notified and MD stated okay for patient to eat. MD to place diet order.

## 2016-03-28 NOTE — Progress Notes (Signed)
After attempting multiple times to find out if patient was going to have bone marrow biopsy today, specials informed me that [place], who does the bone marrow biopsies, has been in a lot of procedures and may not even have time to do the biopsy today. Given [place]'s direct ascom number, [phone], to give to MD for contact. Dr. Anselm Jungling paged and updated with information. Requested that MD put in a diet order if procedure will not be performed. MD acknowledged. Patient is ready to eat now.

## 2016-03-28 NOTE — Progress Notes (Signed)
*  PRELIMINARY RESULTS* Echocardiogram 2D Echocardiogram has been performed.  Gerald [name] 03/28/2016, 2:28 PM

## 2016-03-28 NOTE — Consult Note (Signed)
Cardiology Consultation Note  Patient ID: Gerald [name], MRN: [id number], DOB/AGE: [birth year] 65 y.o. Admit date: 03/27/2016   Date of Consult: 03/28/2016 Primary Physician: No PCP Per Patient Primary Cardiologist: New to [facility]  Chief Complaint: Sent by oncology for elevated troponin on outpatient labs Reason for Consult: Pre-operative evaluation  HPI: 65 y.o. male with no prior cardiac history though with h/o RA not on any medications currently though previously on methotrexate, prior spontaneous pneumothorax in 2001, and prior tobacco abuse x 24 years at 1ppd who quit 16 years ago who presented to [facility] ED on 4/16 with leg pain and was found to have a WBC of 72,000, CXR showed a central right lung mass suspicious for primary malignancy. He was at his oncologist's office on 4/17 when apparently a routine troponin was drawn and found to be elevated at 0.11. Patient was completely asymptomatic. He was sent to the ED for further evaluation. He has not seen an MD in the outpatient setting in 15+ years.    He has lost 30 pounds in 3 months time. In January he began to feel ill with a flu-like illness with associated cough and intermittent fevers. For a period of 2 weeks he reports feeling so poorly he was unable to get out of bed except to use the restroom. He ate minimal food during this time. His symptoms resolved outside of the cough which persisted. Cough is described as clear with intermittent blood streaks/pink tinged sputum. He has denied any chest pain, nausea, vomiting, diaphoresis, presyncope, or syncope. Upon his arrival to [facility] on 4/17 repeat troponin levels were cycled and found to be 0.07-->0.07-->0.06-->0.08. Repeat WBC showed 84,000-->71,000. CTA chest was negative PE. It did show 8 cm mass in the medial right lobe suspicious for primary bronchogenic malignancy that abuts the mediastinum and possibly invades the right atrium. Echo is pending at this time. He has been seen by PCCM with plans for  possibly bronchoscopy on Thursday, 03/30/2016.   Of note, prior to January 2017, he was able to go out and chop wood for his wood stove without any issues. Never with any chest pain. SInce January he has been limited by generalized fatigue and weakness. Not limited by any angina or SOB. Never with and orthopnea or early satiety.   Past Medical History  Diagnosis Date  . Arthritis     Rheumatoid arthritis  . Pneumothorax     spontaneous  . Renal disorder     as a child  . Mass of lung   . Collagen vascular disease ([place])     RA      Most Recent Cardiac Studies: Echo pending   Surgical History:  Past Surgical History  Procedure Laterality Date  . Ureter repair as a child       Home Meds: Prior to Admission medications   Not on File    Inpatient Medications:  . enoxaparin (LOVENOX) injection  40 mg Subcutaneous Q24H  . feeding supplement (ENSURE ENLIVE)  237 mL Oral BID BM  . pneumococcal 23 valent vaccine  0.5 mL Intramuscular Tomorrow-1000  . sodium chloride flush  3 mL Intravenous Q12H   . sodium chloride 75 mL/hr at 03/28/16 1118    Allergies:  Allergies  Allergen Reactions  . Nsaids Other (See Comments)    Pt is unable to take this class of medications.    . Sulfa Antibiotics Other (See Comments)    Reaction:  GI upset     Social History  Social History  . Marital Status: Legally Separated    Spouse Name: N/A  . Number of Children: N/A  . Years of Education: N/A   Occupational History  . Not on file.   Social History Main Topics  . Smoking status: Former Smoker -- 0.50 packs/day for 26 years    Types: Cigarettes    Quit date: 01/31/2016  . Smokeless tobacco: Not on file     Comment: Quit about 5 years ago  . Alcohol Use: 0.0 oz/week    0 Standard drinks or equivalent per week     Comment: occasionally beer  . Drug Use: No  . Sexual Activity: Not on file   Other Topics Concern  . Not on file   Social History Narrative   Lives at home with  son.     Family History  Problem Relation Age of Onset  . Brain cancer Mother   . Lung cancer Maternal Uncle   . Cancer Sister     Metastatic cancer- unknown primary     Review of Systems: Review of Systems  Constitutional: Positive for weight loss and malaise/fatigue. Negative for fever, chills and diaphoresis.       30 pound weight loss since 12/2015 - unintentional   HENT: Negative for congestion.   Eyes: Negative for discharge and redness.  Respiratory: Positive for cough and hemoptysis. Negative for sputum production, shortness of breath and wheezing.   Cardiovascular: Negative for chest pain, palpitations, orthopnea, claudication, leg swelling and PND.  Gastrointestinal: Negative for heartburn, nausea, vomiting, abdominal pain, blood in stool and melena.  Genitourinary: Negative for hematuria.  Musculoskeletal: Negative for myalgias and falls.  Skin: Negative for rash.  Neurological: Positive for weakness. Negative for dizziness, tingling, tremors, sensory change, speech change, focal weakness, seizures and loss of consciousness.  Endo/Heme/Allergies: Does not bruise/bleed easily.  Psychiatric/Behavioral: Negative for substance abuse. The patient is not nervous/anxious.   All other systems reviewed and are negative.   Labs:  Recent Labs  03/27/16 1701 03/27/16 2103 03/28/16 0131 03/28/16 0424  TROPONINI 0.07* 0.07* 0.06* 0.08*   Lab Results  Component Value Date   WBC 71.1* 03/28/2016   HGB 11.9* 03/28/2016   HCT 37.0* 03/28/2016   MCV 88.9 03/28/2016   PLT 232 03/28/2016    Recent Labs Lab 03/27/16 1303 03/28/16 0424  NA 132* 134*  K 3.8 4.0  CL 102 106  CO2 25 24  BUN 12 9  CREATININE 0.86 0.95  CALCIUM 8.2* 8.2*  PROT 8.5*  --   BILITOT 0.8  --   ALKPHOS 85  --   ALT 28  --   AST 30  --   GLUCOSE 88 86   No results found for: CHOL, HDL, LDLCALC, TRIG No results found for: DDIMER  Radiology/Studies:  Dg Chest 2 View  03/26/2016  CLINICAL  DATA:  Cough and weight loss. EXAM: CHEST  2 VIEW COMPARISON:  None. FINDINGS: There is a 5-6 cm mass in the central right lung. There are abnormal mediastinal contours consistent with right peritracheal adenopathy, and there may also be subcarinal mediastinal adenopathy. There is generalized interstitial coarsening. There is blunting of the lateral and posterior costophrenic angles on the left, suggesting a small effusion. No right-sided effusion. IMPRESSION: Central right lung mass, highly suspicious for a primary malignancy. Right paratracheal adenopathy. Probable sub-carinal adenopathy. Recommend chest CT with contrast. These results will be called to the ordering clinician or representative by the Radiologist Assistant, and communication documented in the  PACS or zVision Dashboard. Electronically Signed   By: Andreas Newport M.D.   On: 03/26/2016 02:54   Ct Angio Chest Pe W/cm &/or Wo Cm  03/27/2016  CLINICAL DATA:  Abnormal chest radiograph with lung mass, elevated troponin and tachycardia. EXAM: CT ANGIOGRAPHY CHEST WITH CONTRAST TECHNIQUE: Multidetector CT imaging of the chest was performed using the standard protocol during bolus administration of intravenous contrast. Multiplanar CT image reconstructions and MIPs were obtained to evaluate the vascular anatomy. CONTRAST:  Study 5 mL Isovue 370 IV COMPARISON:  Chest radiographs dated 03/26/2016 FINDINGS: No evidence of pulmonary embolism. Pulmonary artery leading to the right middle lobe is attenuated by the dominant mass (described below). Mediastinum/Nodes: The heart is normal in size. No pericardial effusion. Three vessel coronary atherosclerosis. Thoracic lymphadenopathy, including: --1.6 cm short axis high right paratracheal node (series 4/image 45) --2.3 cm short axis right paratracheal node (series 4/image 57) --1.5 cm short axis AP window node (series 4/image 60) --2.3 cm short axis low right paratracheal node (series 4/image 63) --1.9 cm short  axis subcarinal node (series 4/image 76) Visualized thyroid is unremarkable. Lungs/Pleura: 8.0 x 4.7 x 5.7 cm mass in the medial right upper lobe (series 4/ image 78). Mass occludes a branch of the right upper lobe bronchus (series 6/ image 70) and narrows the right middle lobe bronchus (series 6/ image 88). Mass abuts the mediastinum and may indicate the left atrium, possibly via a pulmonary vein (series 4/ image 89). Increased interstitial markings with satellite nodularity in the right upper lobe measuring up to 10 x 14 mm (series 6/image 83), metastatic disease not excluded. Additional increased interstitial markings with volume loss in the right middle lobe (series 6/image 90), possibly reflecting postobstructive opacity. However, a 7 mm nodule is present in the right middle lobe (series 6/ image 98), and lymphangitic spread with additional tumor is not excluded. Biapical pleural-parenchymal scarring, right greater than left. Underlying mild centrilobular and paraseptal emphysematous changes with bullous changes of the left lung apex. Trace left pleural effusion. Mild dependent atelectasis in the left lower lobe. No pneumothorax. Upper abdomen: Suspected 2.6 cm enhancing lesion in the medial segment left hepatic lobe (series 4/image 151), worrisome for hepatic metastasis. Heterogeneous perfusion with scarring in the medial left upper kidney, likely reflecting sequela of prior infarct or infection. Multiple wedge-shaped infarcts of the spleen, likely reflecting prior vascular insults. Musculoskeletal: No focal osseous lesions. Review of the MIP images confirms the above findings. IMPRESSION: No evidence of pulmonary embolism. 8.0 cm mass in the medial right upper lobe, as described above, suspicious for primary bronchogenic neoplasm. Mass abuts the mediastinum and possibly invades the left atrium. Associated widespread thoracic lymphadenopathy, as above. 2.6 cm enhancing lesion in the medial segment left hepatic  lobe, worrisome for hepatic metastasis. Trace left pleural effusion. Electronically Signed   By: [name] Hy M.D.   On: 03/27/2016 18:05   US Venous Img Lower Unilateral Left  03/25/2016  CLINICAL DATA:  Posterior left knee and left calf pain. Shortness of breath for 3 months . EXAM: LEFT LOWER EXTREMITY VENOUS DOPPLER ULTRASOUND TECHNIQUE: Gray-scale sonography with graded compression, as well as color Doppler and duplex ultrasound were performed to evaluate the lower extremity deep venous systems from the level of the common femoral vein and including the common femoral, femoral, profunda femoral, popliteal and calf veins including the posterior tibial, peroneal and gastrocnemius veins when visible. The superficial great saphenous vein was also interrogated. Spectral Doppler was utilized to evaluate flow at  rest and with distal augmentation maneuvers in the common femoral, femoral and popliteal veins. COMPARISON:  None. FINDINGS: Contralateral Common Femoral Vein: Respiratory phasicity is normal and symmetric with the symptomatic side. No evidence of thrombus. Normal compressibility. Common Femoral Vein: No evidence of thrombus. Normal compressibility, respiratory phasicity and response to augmentation. Saphenofemoral Junction: No evidence of thrombus. Normal compressibility and flow on color Doppler imaging. Profunda Femoral Vein: No evidence of thrombus. Normal compressibility and flow on color Doppler imaging. Femoral Vein: No evidence of thrombus. Normal compressibility, respiratory phasicity and response to augmentation. Popliteal Vein: No evidence of thrombus. Normal compressibility, respiratory phasicity and response to augmentation. Calf Veins: No evidence of thrombus. Normal compressibility and flow on color Doppler imaging. Superficial Great Saphenous Vein: No evidence of thrombus. Normal compressibility and flow on color Doppler imaging. Venous Reflux:  None. Other Findings:  None. IMPRESSION:  No evidence of deep venous thrombosis in the left lower extremity. Electronically Signed   By: Ilona Sorrel M.D.   On: 03/25/2016 22:29    EKG: NSR, 99 bpm, frequent PVCs, no acute st/t changes  Weights: Filed Weights   03/27/16 2059  Weight: 131 lb 1.6 oz (59.467 kg)     Physical Exam: Blood pressure 103/64, pulse 88, temperature 98.3 F (36.8 C), temperature source Oral, resp. rate 18, height _0  (1.803 m), weight 131 lb 1.6 oz (59.467 kg), SpO2 100 %. Body mass index is 18.29 kg/(m^2). General: Frail appearing, in no acute distress. Head: Normocephalic, atraumatic, sclera non-icteric, no xanthomas, nares are without discharge.  Neck: Negative for carotid bruits. JVD not elevated. Lungs: Clear bilaterally to auscultation without wheezes, rales, or rhonchi. Breathing is unlabored. Heart: RRR with S1 S2. No murmurs, rubs, or gallops appreciated. Abdomen: Soft, non-tender, non-distended with normoactive bowel sounds. No hepatomegaly. No rebound/guarding. No obvious abdominal masses. Msk:  Strength and tone appear normal for age. Extremities: No clubbing or cyanosis. No edema.  Distal pedal pulses are 2+ and equal bilaterally. Neuro: Alert and oriented X 3. No facial asymmetry. No focal deficit. Moves all extremities spontaneously. Psych:  Responds to questions appropriately with a normal affect.    Assessment and Plan:   1. Pre-operative evaluation: -Needs bronchoscopy by PCCM, scheduled for Thursday 4/20, tentatively  -Echo is pending to evaluate for possible invasion of mass into right atrium, LV systolic function, and wall motion -Await echo to make formal comments on clearance  -MD to review  2. Newly found right upper lobe mass: -Suspicious for primary malignancy  -Previous smoker of 24 years at 1ppd, quit 16 years ago -Per PCCM/oncology  3. Elevated troponin: -Never with any symptoms of angina, was able to chop wood for his wood stove up until 12/2015 when  -Would  suspect patient to have a mildly elevated troponin if he in fact does have an infiltrative process involving the left atrium -This mild elevation however would be in the setting of demand ischemia of the above rather than ACS, especially given the mild elevation and flat trend seen -Echo is pending as above to evaluate LV systolic function and wall motion   4. Leukocytosis: -Planning for bone marrow biopsy  -Path smear with possible myeloproliferative neoplasm   -Oncology on board  5. RA: -Previously on methotrexate -Not currently on medications   6. History of tobacco abuse: -Quit 16 years ago   Signed, Marcille Blanco Pager: [phone] 03/28/2016, 3:12 PM

## 2016-03-28 NOTE — Progress Notes (Signed)
Initial Nutrition Assessment  DOCUMENTATION CODES:   Severe malnutrition in context of acute illness/injury  INTERVENTION:  Monitor diet progression following procedures Recommend Ensure Enlive po BID, each supplement provides 350 kcal and 20 grams of protein   NUTRITION DIAGNOSIS:   Increased nutrient needs related to cancer and cancer related treatments as evidenced by estimated needs.    GOAL:   Patient will meet greater than or equal to 90% of their needs    MONITOR:   PO intake, Supplement acceptance  REASON FOR ASSESSMENT:   Malnutrition Screening Tool    ASSESSMENT:   65 y/o male admitted with right lung mass, elevated troponin.   Past Medical History  Diagnosis Date  . Arthritis     Rheumatoid arthritis  . Pneumothorax     spontaneous  . Renal disorder     as a child  . Mass of lung   . Collagen vascular disease ([place])     RA   Pt reports good appetite prior to admission however did have time between Jan and Feb that intake was down from not feeling well and having flu like symptoms.  Cough has continued but appetite has picked back up.  Has been NPO today for procedure  Medications reviewed: NS at 67m/hr Labs reviewed: Na 134, calcium 8.2  Nutrition-Focused physical exam completed. Findings are moderate-severe fat depletion, moderate-severe muscle depletion, and none edema.    Diet Order:  Diet regular Room service appropriate?: Yes; Fluid consistency:: Thin  Skin:  Reviewed, no issues  Last BM:  4/18  Height:   Ht Readings from Last 1 Encounters:  03/27/16 '5\' 11"'$  (1.803 m)    Weight: pt reports 18% wt loss in the last 3 months  Wt Readings from Last 1 Encounters:  03/27/16 131 lb 1.6 oz (59.467 kg)    Ideal Body Weight:     BMI:  Body mass index is 18.29 kg/(m^2).  Estimated Nutritional Needs:   Kcal:  13403-5248kcals/d.  Protein:  59-71 g/d  Fluid:  1.7-2 L/d  EDUCATION NEEDS:   No education needs identified at this  time  Gerald [name] B. AZenia Resides RNoonan LPalm [place] North(pager) Weekend/On-Call pager ([phone]

## 2016-03-28 NOTE — Progress Notes (Signed)
Patient has rested comfortably and has complained of no pain. Vitals stable. NSR with PVC's on tele. Normal saline running at 75. Patient will have bone marrow biopsy tomorrow morning, NPO after midnight. Patient educated and consent has been signed. Patient is also scheduled for a bronchoscopy on Thursday. Patient is aware of plan. Will continue to monitor.

## 2016-03-29 ENCOUNTER — Encounter: Payer: Self-pay | Admitting: Hematology and Oncology

## 2016-03-29 ENCOUNTER — Ambulatory Visit: Admission: RE | Admit: 2016-03-29 | Payer: Medicare Other | Source: Ambulatory Visit

## 2016-03-29 ENCOUNTER — Inpatient Hospital Stay: Payer: Medicare Other

## 2016-03-29 ENCOUNTER — Inpatient Hospital Stay: Admission: RE | Admit: 2016-03-29 | Payer: Medicare Other | Source: Ambulatory Visit

## 2016-03-29 LAB — CBC WITH DIFFERENTIAL/PLATELET
Basophils Absolute: 0.7 10*3/uL — ABNORMAL HIGH (ref 0–0.1)
Basophils Relative: 1 %
Eosinophils Absolute: 36.2 10*3/uL — ABNORMAL HIGH (ref 0–0.7)
Eosinophils Relative: 52 %
HCT: 35.9 % — ABNORMAL LOW (ref 40.0–52.0)
Hemoglobin: 11.9 g/dL — ABNORMAL LOW (ref 13.0–18.0)
Lymphocytes Relative: 5 %
Lymphs Abs: 3.5 10*3/uL (ref 1.0–3.6)
MCH: 29.1 pg (ref 26.0–34.0)
MCHC: 33.3 g/dL (ref 32.0–36.0)
MCV: 87.4 fL (ref 80.0–100.0)
Monocytes Absolute: 2.8 10*3/uL — ABNORMAL HIGH (ref 0.2–1.0)
Monocytes Relative: 4 %
Neutro Abs: 26.4 10*3/uL — ABNORMAL HIGH (ref 1.4–6.5)
Neutrophils Relative %: 38 %
Platelets: 258 10*3/uL (ref 150–440)
RBC: 4.11 MIL/uL — ABNORMAL LOW (ref 4.40–5.90)
RDW: 14.8 % — ABNORMAL HIGH (ref 11.5–14.5)
WBC: 69.6 10*3/uL (ref 3.8–10.6)

## 2016-03-29 LAB — APTT: aPTT: 38 seconds — ABNORMAL HIGH (ref 24–36)

## 2016-03-29 LAB — HIV ANTIBODY (ROUTINE TESTING W REFLEX): HIV Screen 4th Generation wRfx: NONREACTIVE

## 2016-03-29 LAB — IGG, IGA, IGM
IgA: 1047 mg/dL — ABNORMAL HIGH (ref 61–437)
IgG (Immunoglobin G), Serum: 2839 mg/dL — ABNORMAL HIGH (ref 700–1600)
IgM, Serum: 166 mg/dL (ref 20–172)

## 2016-03-29 LAB — TRYPTASE: Tryptase: 8.2 ug/L (ref 2.2–13.2)

## 2016-03-29 LAB — HTLV I+II ANTIBODIES, (EIA), BLD: HTLV I/II Ab: NEGATIVE

## 2016-03-29 MED ORDER — FENTANYL CITRATE (PF) 100 MCG/2ML IJ SOLN
INTRAMUSCULAR | Status: AC | PRN
  Administered 2016-03-29: 50 ug via INTRAVENOUS

## 2016-03-29 MED ORDER — HEPARIN SOD (PORK) LOCK FLUSH 100 UNIT/ML IV SOLN
INTRAVENOUS | Status: AC
  Filled 2016-03-29: qty 5

## 2016-03-29 MED ORDER — MIDAZOLAM HCL 5 MG/5ML IJ SOLN
INTRAMUSCULAR | Status: AC
  Filled 2016-03-29: qty 5

## 2016-03-29 MED ORDER — MIDAZOLAM HCL 2 MG/2ML IJ SOLN
INTRAMUSCULAR | Status: AC | PRN
  Administered 2016-03-29: 1 mg via INTRAVENOUS

## 2016-03-29 MED ORDER — FENTANYL CITRATE (PF) 100 MCG/2ML IJ SOLN
INTRAMUSCULAR | Status: AC
  Filled 2016-03-29: qty 4

## 2016-03-29 NOTE — Consult Note (Signed)
[facility]  Date of admission:  03/27/2016  Inpatient day:  03/28/2016  Consulting physician:  Dr. Roland Rack   Reason for Consultation:  Lymphoproliferative disorder  Chief Complaint: Gerald [name] is a 65 y.o. male with a right lung mass and hypereosinophilia who was admitted through the emergency room with elevated troponins.  HPI:   Prior to recent events, the patient last saw a physician approximately 13-14 years ago.  At that time, he was seen by rheumatology at [facility].  He initially had joint complaints in his hands.  He was offered treatment with Remicade, but did not receive treatment.  He did receive MTX in the past.  Since that time, he has had progressive arthritis symptoms in his hands and other joints.  He has been on disability.  He notes a 13-15 pack year smoking history. He smoked for 26 years and stopped smoking 15 years ago.  He was in his usual state of good health until early 12/2015 when he developed flu like symptoms.  He describes diffuse aches, fever 102 with chills, cough, runny nose, and slight nausea.  His symptoms resolved except his cough and poor appetite.  His daughter also notes some hoarseness.  He states that his energy level has continued to be low.  He has had some shortness of breath.  He denies any chest pain.  Since that time, he has lost 30 pounds.  On 03/25/2016, while fishing, he developed cramping in his left calf.  Pain was severe.  He had difficulty walking.  His family pushed him to seek medical attention.  He presented to the ER on 03/25/2016.  Left lower extremity duplex on 03/25/2016 revealed no evidence of DVT.  CXR on 03/26/2016 revealed a 5-6 cm central right lung mass suspicious for primary lung cancer.  There was right paratracheal adenopathy and probable sub-carinal adenopathy.    Labs revealed a hematocrit of 40.7, hemoglobin 13.5, MCV 87, platelets 210,000, WBC 72,300 with an ANC of 28,200.  Differential revealed  37% neutrophils, 2% lymphocytes, 5% monocytes, 53% eosinophils, and 1% basophils.  Absolute eosinophil count was 38,300.  BMP revealed a creatinine of 0.89.  LDH was 204.  Uric acid was 5.0.  He was seen in the medical oncology clinic on 03/27/2016.  Work-up was initiated.  As part of his evaluation for eosinophilia-mediated organ damage or dysfunction, he underwent EKG and troponins.  Tropinin was elevated at 0.11.  He denies any chest pain or pressure.  He underwent chest CT angiogram in the ER.  Imaging revealed  No evidence of pulmonary embolism.  There was an 8 cm mass in the medial right upper lobe suspicious for bronchogenic neoplasm.  The mass abutted the mediastinum and possibly invaded the left atrium.  There was associated widespread thoracic adenopathy.  There was a 2.6 cm enhancing lesion in the medial segment of the left hepatic lobe (metastasis versus hemangioma).  Symptomatically, he notes that the pain in his left leg is now a 1 of out of 10 (nagging discomfort) instead of 9 that he experienced on 03/25/2016.  He continues to have a cough.  He has had 1-2 episodes of scant hemoptysis.  He denies any chest pain.  He notes a little numbness in his left foot.  Past Medical History  Diagnosis Date  . Arthritis     Rheumatoid arthritis  . Pneumothorax     spontaneous  . Renal disorder     as a child  . Mass of  lung   . Collagen vascular disease ([place])     RA    Past Surgical History  Procedure Laterality Date  . Ureter repair as a child      Family History  Problem Relation Age of Onset  . Brain cancer Mother   . Lung cancer Maternal Uncle   . Cancer Sister     Metastatic cancer- unknown primary    Social History:  reports that he quit smoking about 8 weeks ago. His smoking use included Cigarettes. He has a 13 pack-year smoking history. He does not have any smokeless tobacco history on file. He reports that he drinks alcohol. He reports that he does not use illicit drugs.  He has a 13 pack-year smoking history. He lives in [place].  He is accompanied by his daughter, Selena Batten, her boyfriend, and another friend of the family.  Allergies:  Allergies  Allergen Reactions  . Nsaids Other (See Comments)    Pt is unable to take this class of medications.    . Sulfa Antibiotics Other (See Comments)    Reaction:  GI upset     No prescriptions prior to admission    Review of Systems: GENERAL: Fatigue. No fevers or sweats. Weight loss of 30 pounds since 12/2015. PERFORMANCE STATUS (ECOG): 2 HEENT: Blurry vision if goes outside. Last eye check 8-10 years ago. Pollen allergy. No sore throat, mouth sores or tenderness. Lungs: Shortness of breath. Cough productive of clear/white/gray sputum. Scant hemoptysis x 1-2. Cardiac: No chest pain, palpitations, orthopnea, or PND. Sleeps in a recliner. GI: Poor appetite which has improved recently. No nausea, vomiting, diarrhea, constipation, melena or hematochezia. GU: No urgency, frequency, dysuria, or hematuria. Musculoskeletal: No back pain. No joint pain. No muscle tenderness. Extremities: No pain or swelling. Skin: No rashes or skin changes. Neuro: Seldom headache. Little numbness bottom of left foot extends upward slightly (sock distribution). No weakness, balance or coordination issues. Endocrine: No diabetes, thyroid issues, hot flashes or night sweats. Psych: No mood changes, depression or anxiety. Pain: No focal pain. Review of systems: All other systems reviewed and found to be negative.  Physical Exam:  Blood pressure 101/56, pulse 92, temperature 98.5 F (36.9 C), temperature source Oral, resp. rate 20, height 5' 11"  (1.803 m), weight 131 lb 1.6 oz (59.467 kg), SpO2 93 %.  GENERAL: Thin gentleman sitting comfortably on the medical unit in no acute distress. MENTAL STATUS: Alert and oriented to person, place and time. HEAD: Long gray hair in pony tail. Goatee. Temporal wasting.  Normocephalic, atraumatic, face symmetric, no Cushingoid features. EYES: Blue eyes. Pupils equal round and reactive to light and accomodation. No conjunctivitis or scleral icterus. ENT: Oropharynx clear without lesion. Edentulous except 1 tooth. Tongue normal. Mucous membranes moist.  RESPIRATORY: Clear to auscultation without rales, wheezes or rhonchi. CARDIOVASCULAR: Regular rate and rhythm without murmur, rub or gallop. ABDOMEN: Soft, non-tender, with active bowel sounds, and no splenomegaly. Left upper quadrant full. No masses. SKIN: No rashes, ulcers or lesions. EXTREMITIES: No edema, no skin discoloration or tenderness. No palpable cords. LYMPH NODES: No palpable cervical, supraclavicular, axillary or inguinal adenopathy  NEUROLOGICAL:   Appropriate. PSYCH: Appropriate.   Results for orders placed or performed during the hospital encounter of 03/27/16 (from the past 48 hour(s))  Troponin I     Status: Abnormal   Collection Time: 03/27/16  5:01 PM  Result Value Ref Range   Troponin I 0.07 (H) <0.031 ng/mL    Comment: PREVIOUS RESULT CALLED TO BRANDY MOYA  03/27/16 AT 1434 BY [place]. [facility]        PERSISTENTLY INCREASED TROPONIN VALUES IN THE RANGE OF 0.04-0.49 ng/mL CAN BE SEEN IN:       -UNSTABLE ANGINA       -CONGESTIVE HEART FAILURE       -MYOCARDITIS       -CHEST TRAUMA       -ARRYHTHMIAS       -LATE PRESENTING MYOCARDIAL INFARCTION       -COPD   CLINICAL FOLLOW-UP RECOMMENDED.   Troponin I (q 6hr x 3)     Status: Abnormal   Collection Time: 03/27/16  9:03 PM  Result Value Ref Range   Troponin I 0.07 (H) <0.031 ng/mL    Comment: PREVIOUS RESULT CALLED TO BRANDY MOYA 03/27/16 AT 1434 BY [facility].        PERSISTENTLY INCREASED TROPONIN VALUES IN THE RANGE OF 0.04-0.49 ng/mL CAN BE SEEN IN:       -UNSTABLE ANGINA       -CONGESTIVE HEART FAILURE       -MYOCARDITIS       -CHEST TRAUMA       -ARRYHTHMIAS       -LATE PRESENTING MYOCARDIAL INFARCTION        -COPD   CLINICAL FOLLOW-UP RECOMMENDED.   TSH     Status: None   Collection Time: 03/27/16  9:03 PM  Result Value Ref Range   TSH 1.849 0.350 - 4.500 uIU/mL  Urinalysis complete, with microscopic ([facility] only)     Status: Abnormal   Collection Time: 03/27/16 10:00 PM  Result Value Ref Range   Color, Urine YELLOW (A) YELLOW   APPearance CLEAR (A) CLEAR   Glucose, UA NEGATIVE NEGATIVE mg/dL   Bilirubin Urine NEGATIVE NEGATIVE   Ketones, ur NEGATIVE NEGATIVE mg/dL   Specific Gravity, Urine 1.020 1.005 - 1.030   Hgb urine dipstick NEGATIVE NEGATIVE   pH 5.0 5.0 - 8.0   Protein, ur NEGATIVE NEGATIVE mg/dL   Nitrite NEGATIVE NEGATIVE   Leukocytes, UA NEGATIVE NEGATIVE   RBC / HPF NONE SEEN 0 - 5 RBC/hpf   WBC, UA 0-5 0 - 5 WBC/hpf   Bacteria, UA NONE SEEN NONE SEEN   Squamous Epithelial / LPF 0-5 (A) NONE SEEN   Mucous PRESENT   Troponin I (q 6hr x 3)     Status: Abnormal   Collection Time: 03/28/16  1:31 AM  Result Value Ref Range   Troponin I 0.06 (H) <0.031 ng/mL    Comment: PREVIOUS RESULT CALLED TO BRANDY MOYA ON 03/27/16 AT 1434 BY [facility]        PERSISTENTLY INCREASED TROPONIN VALUES IN THE RANGE OF 0.04-0.49 ng/mL CAN BE SEEN IN:       -UNSTABLE ANGINA       -CONGESTIVE HEART FAILURE       -MYOCARDITIS       -CHEST TRAUMA       -ARRYHTHMIAS       -LATE PRESENTING MYOCARDIAL INFARCTION       -COPD   CLINICAL FOLLOW-UP RECOMMENDED.   Troponin I (q 6hr x 3)     Status: Abnormal   Collection Time: 03/28/16  4:24 AM  Result Value Ref Range   Troponin I 0.08 (H) <0.031 ng/mL    Comment: PREVIOUS RESULT CALLED TO BRANDY MOYA ON 03/27/16 AT 1434 BY [facility]        PERSISTENTLY INCREASED TROPONIN VALUES IN THE RANGE OF 0.04-0.49 ng/mL CAN BE SEEN IN:       -  UNSTABLE ANGINA       -CONGESTIVE HEART FAILURE       -MYOCARDITIS       -CHEST TRAUMA       -ARRYHTHMIAS       -LATE PRESENTING MYOCARDIAL INFARCTION       -COPD   CLINICAL FOLLOW-UP RECOMMENDED.   Magnesium      Status: None   Collection Time: 03/28/16  4:24 AM  Result Value Ref Range   Magnesium 1.8 1.7 - 2.4 mg/dL  Basic metabolic panel     Status: Abnormal   Collection Time: 03/28/16  4:24 AM  Result Value Ref Range   Sodium 134 (L) 135 - 145 mmol/L   Potassium 4.0 3.5 - 5.1 mmol/L   Chloride 106 101 - 111 mmol/L   CO2 24 22 - 32 mmol/L   Glucose, Bld 86 65 - 99 mg/dL   BUN 9 6 - 20 mg/dL   Creatinine, Ser 0.95 0.61 - 1.24 mg/dL   Calcium 8.2 (L) 8.9 - 10.3 mg/dL   GFR calc non Af Amer >60 >60 mL/min   GFR calc Af Amer >60 >60 mL/min    Comment: (NOTE) The eGFR has been calculated using the CKD EPI equation. This calculation has not been validated in all clinical situations. eGFR's persistently <60 mL/min signify possible Chronic Kidney Disease.    Anion gap 4 (L) 5 - 15  CBC with Differential     Status: Abnormal   Collection Time: 03/28/16  4:36 AM  Result Value Ref Range   WBC 71.1 (HH) 3.8 - 10.6 K/uL    Comment: RESULT REPEATED AND VERIFIED WHITE COUNT CONFIRMED ON SMEAR CRITICAL RESULT CALLED TO, READ BACK BY AND VERIFIED WITH: ADRIENNE WHITE ON 03/28/16 AT 0600 BY [facility]    RBC 4.16 (L) 4.40 - 5.90 MIL/uL   Hemoglobin 11.9 (L) 13.0 - 18.0 g/dL   HCT 37.0 (L) 40.0 - 52.0 %   MCV 88.9 80.0 - 100.0 fL   MCH 28.6 26.0 - 34.0 pg   MCHC 32.1 32.0 - 36.0 g/dL   RDW 15.1 (H) 11.5 - 14.5 %   Platelets 232 150 - 440 K/uL   Neutrophils Relative % 39 %   Lymphocytes Relative 5 %   Monocytes Relative 4 %   Eosinophils Relative 51 %   Basophils Relative 1 %   Neutro Abs 27.7 (H) 1.4 - 6.5 K/uL   Lymphs Abs 3.6 1.0 - 3.6 K/uL   Monocytes Absolute 2.8 (H) 0.2 - 1.0 K/uL   Eosinophils Absolute 36.3 (H) 0 - 0.7 K/uL   Basophils Absolute 0.7 (H) 0 - 0.1 K/uL   Smear Review LARGE PLATELETS PRESENT   Protime-INR     Status: Abnormal   Collection Time: 03/28/16  4:36 AM  Result Value Ref Range   Prothrombin Time 16.0 (H) 11.4 - 15.0 seconds   INR 1.27   APTT     Status: Abnormal    Collection Time: 03/28/16  4:36 AM  Result Value Ref Range   aPTT 38 (H) 24 - 36 seconds    Comment:        IF BASELINE aPTT IS ELEVATED, SUGGEST PATIENT RISK ASSESSMENT BE USED TO DETERMINE APPROPRIATE ANTICOAGULANT THERAPY.   APTT     Status: Abnormal   Collection Time: 03/29/16  4:08 AM  Result Value Ref Range   aPTT 38 (H) 24 - 36 seconds    Comment:        IF BASELINE aPTT IS  ELEVATED, SUGGEST PATIENT RISK ASSESSMENT BE USED TO DETERMINE APPROPRIATE ANTICOAGULANT THERAPY.    Ct Angio Chest Pe W/cm &/or Wo Cm  03/27/2016  CLINICAL DATA:  Abnormal chest radiograph with lung mass, elevated troponin and tachycardia. EXAM: CT ANGIOGRAPHY CHEST WITH CONTRAST TECHNIQUE: Multidetector CT imaging of the chest was performed using the standard protocol during bolus administration of intravenous contrast. Multiplanar CT image reconstructions and MIPs were obtained to evaluate the vascular anatomy. CONTRAST:  Study 5 mL Isovue 370 IV COMPARISON:  Chest radiographs dated 03/26/2016 FINDINGS: No evidence of pulmonary embolism. Pulmonary artery leading to the right middle lobe is attenuated by the dominant mass (described below). Mediastinum/Nodes: The heart is normal in size. No pericardial effusion. Three vessel coronary atherosclerosis. Thoracic lymphadenopathy, including: --1.6 cm short axis high right paratracheal node (series 4/image 45) --2.3 cm short axis right paratracheal node (series 4/image 57) --1.5 cm short axis AP window node (series 4/image 60) --2.3 cm short axis low right paratracheal node (series 4/image 63) --1.9 cm short axis subcarinal node (series 4/image 76) Visualized thyroid is unremarkable. Lungs/Pleura: 8.0 x 4.7 x 5.7 cm mass in the medial right upper lobe (series 4/ image 78). Mass occludes a branch of the right upper lobe bronchus (series 6/ image 70) and narrows the right middle lobe bronchus (series 6/ image 88). Mass abuts the mediastinum and may indicate the left atrium,  possibly via a pulmonary vein (series 4/ image 89). Increased interstitial markings with satellite nodularity in the right upper lobe measuring up to 10 x 14 mm (series 6/image 83), metastatic disease not excluded. Additional increased interstitial markings with volume loss in the right middle lobe (series 6/image 90), possibly reflecting postobstructive opacity. However, a 7 mm nodule is present in the right middle lobe (series 6/ image 98), and lymphangitic spread with additional tumor is not excluded. Biapical pleural-parenchymal scarring, right greater than left. Underlying mild centrilobular and paraseptal emphysematous changes with bullous changes of the left lung apex. Trace left pleural effusion. Mild dependent atelectasis in the left lower lobe. No pneumothorax. Upper abdomen: Suspected 2.6 cm enhancing lesion in the medial segment left hepatic lobe (series 4/image 151), worrisome for hepatic metastasis. Heterogeneous perfusion with scarring in the medial left upper kidney, likely reflecting sequela of prior infarct or infection. Multiple wedge-shaped infarcts of the spleen, likely reflecting prior vascular insults. Musculoskeletal: No focal osseous lesions. Review of the MIP images confirms the above findings. IMPRESSION: No evidence of pulmonary embolism. 8.0 cm mass in the medial right upper lobe, as described above, suspicious for primary bronchogenic neoplasm. Mass abuts the mediastinum and possibly invades the left atrium. Associated widespread thoracic lymphadenopathy, as above. 2.6 cm enhancing lesion in the medial segment left hepatic lobe, worrisome for hepatic metastasis. Trace left pleural effusion. Electronically Signed   By: [name] Hy M.D.   On: 03/27/2016 18:05    Assessment:  The patient is a 65 y.o. gentleman with leukocytosis with eosinophilia and a right middle lobe mass. Chest CT angiogram on 03/27/2016 revealed no evidence of pulmonary embolism.  There was an 8 cm mass in  the medial right upper lobe suspicious for bronchogenic neoplasm.  The mass abutted the mediastinum and possibly invaded the left atrium via the pulmonary vein.  There was associated widespread thoracic adenopathy.  There was a 2.6 cm enhancing lesion in the medial segment of the left hepatic lobe (metastasis versus hemangioma).  He has a 13-15 pack year smoking history.  He has marked leukocytosis with hypereosinophilia worrisome  for a myeloproliferative neoplasm. Eosinophilia can be associated with lung cancer (large cell or adenocarcinoma) as well as lymphoma. He has no skin, gastrointestinal, or rheumatologic conditions. He has no wheezing. He has shortness of breath likely due to his right chest mass. He has no wheezing. He has lost 30 pounds in the past 4 months. LDH and uric acid are normal.  He has an elevated troponin possibly secondary to tumor invasion of the left atrium or eosinophilic myocarditis.  He denies any chest pain.    He has unexplained left leg discomfort. Left lower extremity duplex on 03/25/2016 revealed no evidence of DVT. He may have a peripheral neuropathy secondary to his hypereosinophilia.  Plan:   1.  Hematology/Oncology:  Discussed with patient evaluation of chest mass and hypereosinophilia.  Discussed obtaining biopsy of chest mass likely via bronchoscopy. Reviewed images with radiology today.  Liver lesion not felt good option for biopsy (hemangioma versus metastasis).  Consider PET scan to see if there is any evidence of metastatic disease and safer site to biopsy.  Discussed concern with patient for possibly underlying myeloproliferative disorder given markedly elevated eosinophils.  Additional labs sent.  Discussed bone marrow aspirate and biopsy.  Procedure discussed in detail.  Pathology forms completed.  Patient may require steroids for treatment of hypereosinophilia.  Check Strongyloides serologies prior to steroids (doubt positive).  2.  Cardiology:   Mass invading pulmonary vein and tracking up to the left atrium per review with radiology.  Likely tumor in heart. Unclear if any bland thrombus.  Possible risk for embolism.  Concern for anticoagulation secondary to hemoptysis.  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  Serial troponins improving.  Cardiology consulted and case reviewed with Dr. Rockey Situ.  3.  Pulmonary:  Probable bronchogenic carcinoma, locally advanced and possible metastatic.  Await tissue confirmation.  Pulmonary consulted and case reviewed with Dr. Stevenson Clinch.  Bronchoscopy tentatively scheduled for 03/30/2016.  6.  Disposition:  Complete work-up in hospital.   Thank you for allowing me to participate in Gerald [name] 's care.  I will follow him closely with you while hospitalized and after discharge in the outpatient department.   Lequita Asal, MD  03/28/2016

## 2016-03-29 NOTE — Procedures (Signed)
Interventional Radiology Procedure Note  Procedure: CT guided aspirate and core biopsy of right iliac bone Complications: None Recommendations: - Bedrest supine x 1 hr   Jennifier Smitherman T. Kathlene Cote, M.D Pager:  [phone]

## 2016-03-29 NOTE — Care Management (Signed)
RNCM attempted complete assessment, patient  is out of room.

## 2016-03-29 NOTE — Care Management Important Message (Signed)
Important Message  Patient Details  Name: Gerald [name] MRN: [id number] Date of Birth: [birth year]   Medicare Important Message Given:  Yes    [name] 03/29/2016, 10:07 AM

## 2016-03-29 NOTE — Care Management Note (Signed)
Case Management Note  Patient Details  Name: Gerald [name] MRN: [id number] Date of Birth: 17-Jul-1951  Subjective/Objective:   [facility] consult for discharge planning.  Met with patient at bedside. He lives at home with his son and daughter. He is independent, drives and requires no DME. No PCP. List of local accepting providers given. Offered  to schedule appointment for him and he declined.  He has Medicare A & B with no Part D coverage. Has not been on prescription medications for many years. He states he will get needed medications at discharge at [facility]. He is on disability ($1400/mon). States he will just have to see what he is discharge on to determine if her can afford his medications.                Action/Plan: RNCM to follow for medication assistance and any additional CM needs.  Expected Discharge Date:                  Expected Discharge Plan:  Home/Self Care  In-House Referral:     Discharge planning Services  CM Consult  Post Acute Care Choice:    Choice offered to:     DME Arranged:    DME Agency:     HH Arranged:    HH Agency:     Status of Service:  In process, will continue to follow  Medicare Important Message Given:  Yes Date Medicare IM Given:    Medicare IM give by:    Date Additional Medicare IM Given:    Additional Medicare Important Message give by:     If discussed at [place] of Stay Meetings, dates discussed:    Additional Comments:  Jolly Mango, RN 03/29/2016, 11:52 AM

## 2016-03-29 NOTE — Progress Notes (Signed)
[place] at [place] NAME: Gerald [name]    MR#:  [id number]  DATE OF BIRTH:  1951-11-01  SUBJECTIVE:  CHIEF COMPLAINT:   Chief Complaint  Patient presents with  . Abnormal Lab     Sent from oncology for elevated troponin.  Have a mass in lung- need further work ups for that.  REVIEW OF SYSTEMS:   CONSTITUTIONAL: No fever, fatigue or weakness. Have weight loss. EYES: No blurred or double vision.  EARS, NOSE, AND THROAT: No tinnitus or ear pain.  RESPIRATORY: No cough, shortness of breath, wheezing or hemoptysis.  CARDIOVASCULAR: No chest pain, orthopnea, edema.  GASTROINTESTINAL: No nausea, vomiting, diarrhea or abdominal pain.  GENITOURINARY: No dysuria, hematuria.  ENDOCRINE: No polyuria, nocturia,  HEMATOLOGY: No anemia, easy bruising or bleeding SKIN: No rash or lesion. MUSCULOSKELETAL: No joint pain or arthritis.   NEUROLOGIC: No tingling, numbness, weakness.  PSYCHIATRY: No anxiety or depression.   ROS  DRUG ALLERGIES:   Allergies  Allergen Reactions  . Nsaids Other (See Comments)    Pt is unable to take this class of medications.    . Sulfa Antibiotics Other (See Comments)    Reaction:  GI upset     VITALS:  Blood pressure 94/63, pulse 93, temperature 98.5 F (36.9 C), temperature source Oral, resp. rate 31, height 5' 11"  (1.803 m), weight 59.467 kg (131 lb 1.6 oz), SpO2 96 %.  PHYSICAL EXAMINATION:  GENERAL:  65 y.o.-year-old patient lying in the bed with no acute distress.  EYES: Pupils equal, round, reactive to light and accommodation. No scleral icterus. Extraocular muscles intact.  HEENT: Head atraumatic, normocephalic. Oropharynx and nasopharynx clear.  NECK:  Supple, no jugular venous distention. No thyroid enlargement, no tenderness.  LUNGS: Normal breath sounds bilaterally, no wheezing, rales,rhonchi or crepitation. No use of accessory muscles of respiration.  CARDIOVASCULAR: S1, S2 normal. No murmurs, rubs, or  gallops.  ABDOMEN: Soft, nontender, nondistended. Bowel sounds present. No organomegaly or mass.  EXTREMITIES: No pedal edema, cyanosis, or clubbing.  NEUROLOGIC: Cranial nerves II through XII are intact. Muscle strength 5/5 in all extremities. Sensation intact. Gait not checked.  PSYCHIATRIC: The patient is alert and oriented x 3.  SKIN: No obvious rash, lesion, or ulcer.   Physical Exam LABORATORY PANEL:   CBC  Recent Labs Lab 03/29/16 0408  WBC 69.6*  HGB 11.9*  HCT 35.9*  PLT 258   ------------------------------------------------------------------------------------------------------------------  Chemistries   Recent Labs Lab 03/27/16 1303 03/28/16 0424  NA 132* 134*  K 3.8 4.0  CL 102 106  CO2 25 24  GLUCOSE 88 86  BUN 12 9  CREATININE 0.86 0.95  CALCIUM 8.2* 8.2*  MG  --  1.8  AST 30  --   ALT 28  --   ALKPHOS 85  --   BILITOT 0.8  --    ------------------------------------------------------------------------------------------------------------------  Cardiac Enzymes  Recent Labs Lab 03/28/16 0131 03/28/16 0424  TROPONINI 0.06* 0.08*   ------------------------------------------------------------------------------------------------------------------  RADIOLOGY:  Ct Angio Chest Pe W/cm &/or Wo Cm  03/27/2016  CLINICAL DATA:  Abnormal chest radiograph with lung mass, elevated troponin and tachycardia. EXAM: CT ANGIOGRAPHY CHEST WITH CONTRAST TECHNIQUE: Multidetector CT imaging of the chest was performed using the standard protocol during bolus administration of intravenous contrast. Multiplanar CT image reconstructions and MIPs were obtained to evaluate the vascular anatomy. CONTRAST:  Study 5 mL Isovue 370 IV COMPARISON:  Chest radiographs dated 03/26/2016 FINDINGS: No evidence of pulmonary embolism. Pulmonary artery  leading to the right middle lobe is attenuated by the dominant mass (described below). Mediastinum/Nodes: The heart is normal in size. No  pericardial effusion. Three vessel coronary atherosclerosis. Thoracic lymphadenopathy, including: --1.6 cm short axis high right paratracheal node (series 4/image 45) --2.3 cm short axis right paratracheal node (series 4/image 57) --1.5 cm short axis AP window node (series 4/image 60) --2.3 cm short axis low right paratracheal node (series 4/image 63) --1.9 cm short axis subcarinal node (series 4/image 76) Visualized thyroid is unremarkable. Lungs/Pleura: 8.0 x 4.7 x 5.7 cm mass in the medial right upper lobe (series 4/ image 78). Mass occludes a branch of the right upper lobe bronchus (series 6/ image 70) and narrows the right middle lobe bronchus (series 6/ image 88). Mass abuts the mediastinum and may indicate the left atrium, possibly via a pulmonary vein (series 4/ image 89). Increased interstitial markings with satellite nodularity in the right upper lobe measuring up to 10 x 14 mm (series 6/image 83), metastatic disease not excluded. Additional increased interstitial markings with volume loss in the right middle lobe (series 6/image 90), possibly reflecting postobstructive opacity. However, a 7 mm nodule is present in the right middle lobe (series 6/ image 98), and lymphangitic spread with additional tumor is not excluded. Biapical pleural-parenchymal scarring, right greater than left. Underlying mild centrilobular and paraseptal emphysematous changes with bullous changes of the left lung apex. Trace left pleural effusion. Mild dependent atelectasis in the left lower lobe. No pneumothorax. Upper abdomen: Suspected 2.6 cm enhancing lesion in the medial segment left hepatic lobe (series 4/image 151), worrisome for hepatic metastasis. Heterogeneous perfusion with scarring in the medial left upper kidney, likely reflecting sequela of prior infarct or infection. Multiple wedge-shaped infarcts of the spleen, likely reflecting prior vascular insults. Musculoskeletal: No focal osseous lesions. Review of the MIP  images confirms the above findings. IMPRESSION: No evidence of pulmonary embolism. 8.0 cm mass in the medial right upper lobe, as described above, suspicious for primary bronchogenic neoplasm. Mass abuts the mediastinum and possibly invades the left atrium. Associated widespread thoracic lymphadenopathy, as above. 2.6 cm enhancing lesion in the medial segment left hepatic lobe, worrisome for hepatic metastasis. Trace left pleural effusion. Electronically Signed   By: [name] Hy M.D.   On: 03/27/2016 18:05    ASSESSMENT AND PLAN:   Active Problems:   Lung mass   Protein-calorie malnutrition, severe   Lymphoproliferative disease (HCC)   Cough   Atrial mass   Elevated troponin  #1 elevated troponin- Likely demand ischemia and strain on the heart from the lung mass possibly abutting the left atrium - recycle troponins stable. PVCs on monitor- monitored on tele - ECHO ordered. Checked TSH - oncology consulted. - cardiology on case.  #2 right lung mass- Right lung mass, 8cm, likely malignancy. Also liver lesion-? Metastatic spread - oncology consulted. PET scan likely - pulmonary is planning to do bronch on Thursday.  #3 leukocytosis-likely underlying lymphoproliferative disorder. White count today is further elevated at 84,000 -Bone marrow biopsy requested by oncology. Also will need a PET scan No evidence of any infection. Hold off on antibiotics.   #4 rheumatoid arthritis-not following with any physician. No arthritis symptoms or flares at this time.  #5 DVT prophylaxis-on subcutaneous heparin   All the records are reviewed and case discussed with Care Management/Social Workerr. Management plans discussed with the patient, family and they are in agreement.  CODE STATUS: full.  TOTAL TIME TAKING CARE OF THIS PATIENT: 35 minutes.  POSSIBLE D/C IN 2-3 DAYS, DEPENDING ON CLINICAL CONDITION.   Vaughan Basta M.D on 03/29/2016   Between 7am to 6pm - Pager -  [phone]  After 6pm go to [email or web address] - password [email or web address] [facility]  Office  [phone]  CC: Primary care physician; No PCP Per Patient  Note: This dictation was prepared with Dragon dictation along with smaller phrase technology. Any transcriptional errors that result from this process are unintentional.

## 2016-03-29 NOTE — Progress Notes (Signed)
   Given left atrial opacity/mass patient will need to undergo TEE for further evaluation of mass/opacity per Dr. Fletcher Anon, MD and Dr. Ellyn Hack MD. Will schedule for Friday, April 21. 2017 with Dr. Fletcher Anon, MD.

## 2016-03-30 ENCOUNTER — Encounter: Payer: Self-pay | Admitting: Anesthesiology

## 2016-03-30 ENCOUNTER — Inpatient Hospital Stay: Payer: Medicare Other | Admitting: Anesthesiology

## 2016-03-30 ENCOUNTER — Inpatient Hospital Stay: Payer: Medicare Other | Admitting: Hematology and Oncology

## 2016-03-30 ENCOUNTER — Encounter
Admission: EM | Discharge status: Against Medical Advice | Payer: Self-pay | Source: Home / Self Care | Attending: Internal Medicine

## 2016-03-30 ENCOUNTER — Inpatient Hospital Stay: Payer: Medicare Other

## 2016-03-30 DIAGNOSIS — C3411 Malignant neoplasm of upper lobe, right bronchus or lung: Secondary | ICD-10-CM | POA: Insufficient documentation

## 2016-03-30 HISTORY — PX: FLEXIBLE BRONCHOSCOPY: SHX5094

## 2016-03-30 LAB — CBC WITH DIFFERENTIAL/PLATELET
Basophils Absolute: 0 10*3/uL (ref 0–0.1)
Basophils Relative: 0 %
Eosinophils Absolute: 37.2 10*3/uL — ABNORMAL HIGH (ref 0–0.7)
Eosinophils Relative: 53 %
HCT: 36.9 % — ABNORMAL LOW (ref 40.0–52.0)
Hemoglobin: 11.9 g/dL — ABNORMAL LOW (ref 13.0–18.0)
Lymphocytes Relative: 5 %
Lymphs Abs: 3.5 10*3/uL (ref 1.0–3.6)
MCH: 28.6 pg (ref 26.0–34.0)
MCHC: 32.4 g/dL (ref 32.0–36.0)
MCV: 88.1 fL (ref 80.0–100.0)
Monocytes Absolute: 3.5 10*3/uL — ABNORMAL HIGH (ref 0.2–1.0)
Monocytes Relative: 5 %
Neutro Abs: 25.9 10*3/uL — ABNORMAL HIGH (ref 1.4–6.5)
Neutrophils Relative %: 37 %
Platelets: 258 10*3/uL (ref 150–440)
RBC: 4.19 MIL/uL — ABNORMAL LOW (ref 4.40–5.90)
RDW: 15.2 % — ABNORMAL HIGH (ref 11.5–14.5)
WBC: 70.1 10*3/uL (ref 3.8–10.6)

## 2016-03-30 LAB — IGE: IgE (Immunoglobulin E), Serum: 247 IU/mL — ABNORMAL HIGH (ref 0–100)

## 2016-03-30 SURGERY — BRONCHOSCOPY, FLEXIBLE
Anesthesia: General

## 2016-03-30 MED ORDER — ONDANSETRON HCL 4 MG/2ML IJ SOLN
INTRAMUSCULAR | Status: DC | PRN
  Administered 2016-03-30: 4 mg via INTRAVENOUS

## 2016-03-30 MED ORDER — DEXAMETHASONE SODIUM PHOSPHATE 10 MG/ML IJ SOLN
INTRAMUSCULAR | Status: DC | PRN
  Administered 2016-03-30: 5 mg via INTRAVENOUS

## 2016-03-30 MED ORDER — SODIUM CHLORIDE 0.9 % IV SOLN: INTRAVENOUS | Status: DC

## 2016-03-30 MED ORDER — PHENYLEPHRINE HCL 10 MG/ML IJ SOLN
INTRAMUSCULAR | Status: DC | PRN
  Administered 2016-03-30: 100 ug via INTRAVENOUS
  Administered 2016-03-30 (×2): 200 ug via INTRAVENOUS

## 2016-03-30 MED ORDER — NEOSTIGMINE METHYLSULFATE 10 MG/10ML IV SOLN
INTRAVENOUS | Status: DC | PRN
  Administered 2016-03-30: 3 mg via INTRAVENOUS

## 2016-03-30 MED ORDER — ROCURONIUM BROMIDE 100 MG/10ML IV SOLN: INTRAVENOUS | Status: DC | PRN

## 2016-03-30 MED ORDER — FENTANYL CITRATE (PF) 100 MCG/2ML IJ SOLN
25.0000 ug | INTRAMUSCULAR | Status: DC | PRN
Start: 2016-03-30 — End: 2016-03-31

## 2016-03-30 MED ORDER — FENTANYL CITRATE (PF) 100 MCG/2ML IJ SOLN
INTRAMUSCULAR | Status: DC | PRN
  Administered 2016-03-30: 50 ug via INTRAVENOUS

## 2016-03-30 MED ORDER — LIDOCAINE HCL (CARDIAC) 20 MG/ML IV SOLN
INTRAVENOUS | Status: DC | PRN
  Administered 2016-03-30: 80 mg via INTRAVENOUS

## 2016-03-30 MED ORDER — GLYCOPYRROLATE 0.2 MG/ML IJ SOLN
INTRAMUSCULAR | Status: DC | PRN
  Administered 2016-03-30: .4 mg via INTRAVENOUS

## 2016-03-30 MED ORDER — MIDAZOLAM HCL 2 MG/2ML IJ SOLN
INTRAMUSCULAR | Status: DC | PRN
  Administered 2016-03-30: 1 mg via INTRAVENOUS

## 2016-03-30 MED ORDER — SUCCINYLCHOLINE CHLORIDE 20 MG/ML IJ SOLN
INTRAMUSCULAR | Status: DC | PRN
  Administered 2016-03-30: 60 mg via INTRAVENOUS

## 2016-03-30 MED ORDER — ONDANSETRON HCL 4 MG/2ML IJ SOLN: 4.0000 mg | Freq: Once | INTRAMUSCULAR | Status: DC | PRN

## 2016-03-30 MED ORDER — PROPOFOL 10 MG/ML IV BOLUS
INTRAVENOUS | Status: DC | PRN
  Administered 2016-03-30: 70 mg via INTRAVENOUS

## 2016-03-30 MED ORDER — LACTATED RINGERS IV SOLN
INTRAVENOUS | Status: DC | PRN
  Administered 2016-03-30: 13:00:00 via INTRAVENOUS

## 2016-03-30 MED ORDER — FENTANYL CITRATE (PF) 100 MCG/2ML IJ SOLN
25.0000 ug | INTRAMUSCULAR | Status: DC | PRN
  Administered 2016-03-30 (×4): 25 ug via INTRAVENOUS
  Filled 2016-03-30 (×4): qty 0.5

## 2016-03-30 NOTE — Progress Notes (Signed)
[facility] at [place] NAME: Gerald [name]    MR#:  [id number]  DATE OF BIRTH:  Jul 07, 1951  SUBJECTIVE:  CHIEF COMPLAINT:   Chief Complaint  Patient presents with  . Abnormal Lab     Sent from oncology for elevated troponin.  Have a mass in lung- need further work ups for that.   Scheduled for BM biopsy today, and bronch tomorrow.    No complains.  REVIEW OF SYSTEMS:   CONSTITUTIONAL: No fever, fatigue or weakness. Have weight loss. EYES: No blurred or double vision.  EARS, NOSE, AND THROAT: No tinnitus or ear pain.  RESPIRATORY: No cough, shortness of breath, wheezing or hemoptysis.  CARDIOVASCULAR: No chest pain, orthopnea, edema.  GASTROINTESTINAL: No nausea, vomiting, diarrhea or abdominal pain.  GENITOURINARY: No dysuria, hematuria.  ENDOCRINE: No polyuria, nocturia,  HEMATOLOGY: No anemia, easy bruising or bleeding SKIN: No rash or lesion. MUSCULOSKELETAL: No joint pain or arthritis.   NEUROLOGIC: No tingling, numbness, weakness.  PSYCHIATRY: No anxiety or depression.   ROS  DRUG ALLERGIES:   Allergies  Allergen Reactions  . Nsaids Other (See Comments)    Pt is unable to take this class of medications.    . Sulfa Antibiotics Other (See Comments)    Reaction:  GI upset     VITALS:  Blood pressure 93/53, pulse 93, temperature 98.5 F (36.9 C), temperature source Oral, resp. rate 20, height 5' 11"  (1.803 m), weight 59.467 kg (131 lb 1.6 oz), SpO2 92 %.  PHYSICAL EXAMINATION:  GENERAL:  65 y.o.-year-old patient lying in the bed with no acute distress.  EYES: Pupils equal, round, reactive to light and accommodation. No scleral icterus. Extraocular muscles intact.  HEENT: Head atraumatic, normocephalic. Oropharynx and nasopharynx clear.  NECK:  Supple, no jugular venous distention. No thyroid enlargement, no tenderness.  LUNGS: Normal breath sounds bilaterally, no wheezing, rales,rhonchi or crepitation. No use of accessory  muscles of respiration.  CARDIOVASCULAR: S1, S2 normal. No murmurs, rubs, or gallops.  ABDOMEN: Soft, nontender, nondistended. Bowel sounds present. No organomegaly or mass.  EXTREMITIES: No pedal edema, cyanosis, or clubbing.  NEUROLOGIC: Cranial nerves II through XII are intact. Muscle strength 5/5 in all extremities. Sensation intact. Gait not checked.  PSYCHIATRIC: The patient is alert and oriented x 3.  SKIN: No obvious rash, lesion, or ulcer.   Physical Exam LABORATORY PANEL:   CBC  Recent Labs Lab 03/30/16 0409  WBC 70.1*  HGB 11.9*  HCT 36.9*  PLT 258   ------------------------------------------------------------------------------------------------------------------  Chemistries   Recent Labs Lab 03/27/16 1303 03/28/16 0424  NA 132* 134*  K 3.8 4.0  CL 102 106  CO2 25 24  GLUCOSE 88 86  BUN 12 9  CREATININE 0.86 0.95  CALCIUM 8.2* 8.2*  MG  --  1.8  AST 30  --   ALT 28  --   ALKPHOS 85  --   BILITOT 0.8  --    ------------------------------------------------------------------------------------------------------------------  Cardiac Enzymes  Recent Labs Lab 03/28/16 0131 03/28/16 0424  TROPONINI 0.06* 0.08*   ------------------------------------------------------------------------------------------------------------------  RADIOLOGY:  Ct Biopsy  03/29/2016  CLINICAL DATA:  Leukocytosis with abnormal peripheral smear it and suspicion of myelodysplasia/lymphoproliferative disorder. The patient requires a bone marrow biopsy for further workup. EXAM: CT-GUIDED ILIAC BONE MARROW ASPIRATE AND BIOPSY ANESTHESIA/SEDATION: Versed 1.0 mg IV, Fentanyl 50 mcg IV Total Moderate Sedation Time 10 minutes. The patient's level of consciousness and physiologic status were continuously monitored during the procedure by Radiology nursing.  PROCEDURE: The procedure risks, benefits, and alternatives were explained to the patient. Questions regarding the procedure were  encouraged and answered. The patient understands and consents to the procedure. A time-out was performed prior to initiating the procedure. The right gluteal region was prepped with Betadine. Sterile gown and sterile gloves were used for the procedure. Local anesthesia was provided with 1% Lidocaine. Under CT guidance, an 11 gauge OnControl bone cutting needle was advanced from a posterior approach into the right iliac bone. Needle positioning was confirmed with CT. Initial non heparinized and heparinized aspirate samples were obtained of bone marrow. Core biopsy was performed with the OnControl 11 G needle. COMPLICATIONS: None FINDINGS: Inspection of initial aspirate did reveal visible particles. Intact core biopsy sample was obtained. IMPRESSION: CT guided bone marrow biopsy of right posterior iliac bone with both aspirate and core samples obtained. Electronically Signed   By: Aletta Edouard M.D.   On: 03/29/2016 10:19    ASSESSMENT AND PLAN:   Active Problems:   Lung mass   Protein-calorie malnutrition, severe   Lymphoproliferative disease (HCC)   Cough   Atrial mass   Elevated troponin  #1 elevated troponin- Likely demand ischemia and strain on the heart from the lung mass possibly abutting the left atrium - recycle troponins stable. PVCs on monitor- monitored on tele - ECHO ordered. Checked TSH - oncology consulted. - cardiology on case. - cardio cleared for further work ups. - a mass in LA, cardio planning for TEE. -  Will d/c tele, as stable.  #2 right lung mass- Right lung mass, 8cm, likely malignancy. Also liver lesion-? Metastatic spread - oncology consulted. PET scan likely - pulmonary is planning to do bronch on Thursday.  #3 leukocytosis-likely underlying lymphoproliferative disorder. White count today is further elevated at 84,000 -Bone marrow biopsy requested by oncology. Also will need a PET scan No evidence of any infection. Hold off on antibiotics.  - BM biopsy  03/29/16.  #4 rheumatoid arthritis-not following with any physician. No arthritis symptoms or flares at this time.  #5 DVT prophylaxis-on subcutaneous heparin   All the records are reviewed and case discussed with Care Management/Social Workerr. Management plans discussed with the patient, family and they are in agreement.  CODE STATUS: full.  TOTAL TIME TAKING CARE OF THIS PATIENT: 35 minutes.    POSSIBLE D/C IN 2-3 DAYS, DEPENDING ON CLINICAL CONDITION.   Vaughan Basta M.D on 03/30/2016   Between 7am to 6pm - Pager - [phone]  After 6pm go to [email or web address] - password [email or web address] [facility]  Office  [phone]  CC: Primary care physician; No PCP Per Patient  Note: This dictation was prepared with Dragon dictation along with smaller phrase technology. Any transcriptional errors that result from this process are unintentional.

## 2016-03-30 NOTE — Transfer of Care (Signed)
Immediate Anesthesia Transfer of Care Note  Patient: Gerald [name]  Procedure(s) Performed: Procedure(s): FLEXIBLE BRONCHOSCOPY (N/A)  Patient Location: PACU  Anesthesia Type:General  Level of Consciousness: awake, alert , oriented and patient cooperative  Airway & Oxygen Therapy: Patient Spontanous Breathing and Patient connected to face mask oxygen  Post-op Assessment: Report given to RN, Post -op Vital signs reviewed and stable and Patient moving all extremities X 4  Post vital signs: Reviewed and stable  Last Vitals:  Filed Vitals:   03/30/16 1100 03/30/16 1412  BP: 100/59 82/55  Pulse: 87 56  Temp:  36.1 C  Resp: 18 19    Complications: No apparent anesthesia complications

## 2016-03-30 NOTE — Progress Notes (Signed)
[place] at [place] NAME: Gerald [name]    MR#:  [id number]  DATE OF BIRTH:  21-Feb-1951  SUBJECTIVE:  CHIEF COMPLAINT:   Chief Complaint  Patient presents with  . Abnormal Lab     Sent from oncology for elevated troponin.  Have a mass in lung- need further work ups for that.   Scheduled for bronch and TEE today.    No complains.  REVIEW OF SYSTEMS:   CONSTITUTIONAL: No fever, fatigue or weakness. Have weight loss. EYES: No blurred or double vision.  EARS, NOSE, AND THROAT: No tinnitus or ear pain.  RESPIRATORY: No cough, shortness of breath, wheezing or hemoptysis.  CARDIOVASCULAR: No chest pain, orthopnea, edema.  GASTROINTESTINAL: No nausea, vomiting, diarrhea or abdominal pain.  GENITOURINARY: No dysuria, hematuria.  ENDOCRINE: No polyuria, nocturia,  HEMATOLOGY: No anemia, easy bruising or bleeding SKIN: No rash or lesion. MUSCULOSKELETAL: No joint pain or arthritis.   NEUROLOGIC: No tingling, numbness, weakness.  PSYCHIATRY: No anxiety or depression.   ROS  DRUG ALLERGIES:   Allergies  Allergen Reactions  . Nsaids Other (See Comments)    Pt is unable to take this class of medications.    . Sulfa Antibiotics Other (See Comments)    Reaction:  GI upset     VITALS:  Blood pressure 92/60, pulse 91, temperature 97.5 F (36.4 C), temperature source Oral, resp. rate 20, height 5' 11"  (1.803 m), weight 59.467 kg (131 lb 1.6 oz), SpO2 93 %.  PHYSICAL EXAMINATION:  GENERAL:  66 y.o.-year-old patient lying in the bed with no acute distress.  EYES: Pupils equal, round, reactive to light and accommodation. No scleral icterus. Extraocular muscles intact.  HEENT: Head atraumatic, normocephalic. Oropharynx and nasopharynx clear.  NECK:  Supple, no jugular venous distention. No thyroid enlargement, no tenderness.  LUNGS: Normal breath sounds bilaterally, no wheezing, rales,rhonchi or crepitation. No use of accessory muscles of  respiration.  CARDIOVASCULAR: S1, S2 normal. No murmurs, rubs, or gallops.  ABDOMEN: Soft, nontender, nondistended. Bowel sounds present. No organomegaly or mass.  EXTREMITIES: No pedal edema, cyanosis, or clubbing.  NEUROLOGIC: Cranial nerves II through XII are intact. Muscle strength 5/5 in all extremities. Sensation intact. Gait not checked.  PSYCHIATRIC: The patient is alert and oriented x 3.  SKIN: No obvious rash, lesion, or ulcer.   Physical Exam LABORATORY PANEL:   CBC  Recent Labs Lab 03/30/16 0409  WBC 70.1*  HGB 11.9*  HCT 36.9*  PLT 258   ------------------------------------------------------------------------------------------------------------------  Chemistries   Recent Labs Lab 03/27/16 1303 03/28/16 0424  NA 132* 134*  K 3.8 4.0  CL 102 106  CO2 25 24  GLUCOSE 88 86  BUN 12 9  CREATININE 0.86 0.95  CALCIUM 8.2* 8.2*  MG  --  1.8  AST 30  --   ALT 28  --   ALKPHOS 85  --   BILITOT 0.8  --    ------------------------------------------------------------------------------------------------------------------  Cardiac Enzymes  Recent Labs Lab 03/28/16 0131 03/28/16 0424  TROPONINI 0.06* 0.08*   ------------------------------------------------------------------------------------------------------------------  RADIOLOGY:  Ct Biopsy  03/29/2016  CLINICAL DATA:  Leukocytosis with abnormal peripheral smear it and suspicion of myelodysplasia/lymphoproliferative disorder. The patient requires a bone marrow biopsy for further workup. EXAM: CT-GUIDED ILIAC BONE MARROW ASPIRATE AND BIOPSY ANESTHESIA/SEDATION: Versed 1.0 mg IV, Fentanyl 50 mcg IV Total Moderate Sedation Time 10 minutes. The patient's level of consciousness and physiologic status were continuously monitored during the procedure by Radiology nursing. PROCEDURE: The  procedure risks, benefits, and alternatives were explained to the patient. Questions regarding the procedure were encouraged and  answered. The patient understands and consents to the procedure. A time-out was performed prior to initiating the procedure. The right gluteal region was prepped with Betadine. Sterile gown and sterile gloves were used for the procedure. Local anesthesia was provided with 1% Lidocaine. Under CT guidance, an 11 gauge OnControl bone cutting needle was advanced from a posterior approach into the right iliac bone. Needle positioning was confirmed with CT. Initial non heparinized and heparinized aspirate samples were obtained of bone marrow. Core biopsy was performed with the OnControl 11 G needle. COMPLICATIONS: None FINDINGS: Inspection of initial aspirate did reveal visible particles. Intact core biopsy sample was obtained. IMPRESSION: CT guided bone marrow biopsy of right posterior iliac bone with both aspirate and core samples obtained. Electronically Signed   By: Aletta Edouard M.D.   On: 03/29/2016 10:19   Dg C-arm 1-60 Min-no Report  03/30/2016  CLINICAL DATA: bronchoscopy,lesion of RUL C-ARM 1-60 MINUTES Fluoroscopy was utilized by the requesting physician.  No radiographic interpretation.    ASSESSMENT AND PLAN:   Active Problems:   Lung mass   Protein-calorie malnutrition, severe   Lymphoproliferative disease (HCC)   Cough   Atrial mass   Elevated troponin  #1 elevated troponin- Likely demand ischemia and strain on the heart from the lung mass possibly abutting the left atrium - recycle troponins stable. PVCs on monitor- monitored on tele - ECHO: LV EF: 50% - 55%,  a mass in LA, cardio planning for TEE today.  #2 right lung mass- Right lung mass, 8cm, likely malignancy. Also liver lesion-? Metastatic spread - oncology consulted. PET scan as outpatient. bronch: Right upper lobe mass, RUL anterior segement with endobronchial lesion 95% occlusion.  #3 leukocytosis-likely underlying lymphoproliferative disorder. White count is still high. S/p Bone marrow biopsy. No evidence of any  infection. Hold off on antibiotics.   #4 rheumatoid arthritis-not following with any physician. No arthritis symptoms or flares at this time.  #5 DVT prophylaxis-on subcutaneous heparin   All the records are reviewed and case discussed with Care Management/Social Workerr. Management plans discussed with the patient, family and they are in agreement.  CODE STATUS: full.  TOTAL TIME TAKING CARE OF THIS PATIENT: 54mnutes.    POSSIBLE D/C IN 2 DAYS, DEPENDING ON CLINICAL CONDITION.-Demetrios LollM.D on 03/30/2016   Between 7am to 6pm - Pager - [phone] After 6pm go to [email or web address] - password [email or web address]  Office  [phone] CC: Primary care physician; No PCP Per Patient  Note: This dictation was prepared with Dragon dictation along with smaller phrase technology. Any transcriptional errors that result from this process are unintentional.

## 2016-03-30 NOTE — Anesthesia Preprocedure Evaluation (Addendum)
Anesthesia Evaluation  Patient identified by MRN, date of birth, ID band Patient awake    Reviewed: Allergy & Precautions, NPO status , Patient's Chart, lab work & pertinent test results, reviewed documented beta blocker date and time   Airway Mallampati: II  TM Distance: >3 FB     Dental  (+) Chipped, Upper Dentures, Lower Dentures, Poor Dentition   Pulmonary former smoker,           Cardiovascular      Neuro/Psych    GI/Hepatic   Endo/Other    Renal/GU Renal InsufficiencyRenal disease     Musculoskeletal  (+) Arthritis ,   Abdominal   Peds  Hematology  (+) anemia ,   Anesthesia Other Findings Lung mass. Poor dentition, only one tooth at bottom. Anemic 11.9.  Reproductive/Obstetrics                           Anesthesia Physical Anesthesia Plan  ASA: III  Anesthesia Plan: General   Post-op Pain Management:    Induction: Intravenous  Airway Management Planned: Oral ETT  Additional Equipment:   Intra-op Plan:   Post-operative Plan:   Informed Consent: I have reviewed the patients History and Physical, chart, labs and discussed the procedure including the risks, benefits and alternatives for the proposed anesthesia with the patient or authorized representative who has indicated his/her understanding and acceptance.     Plan Discussed with: CRNA  Anesthesia Plan Comments:         Anesthesia Quick Evaluation

## 2016-03-30 NOTE — Progress Notes (Signed)
Pulmonary Update: S/P Bronchoscopy with Biopsy Endobronchial lesion seen in the RUL - biopsies of this area performed. Further treatment of Lung mass based on biopsy results and Heme\Onc plans.  Vilinda Boehringer, MD [facility] Pager [phone] (please enter 7-digits) On Call Pager - [phone] (please enter 7-digits)

## 2016-03-30 NOTE — Op Note (Signed)
[facility] Patient Name: Gerald [name] Procedure Date: 03/30/2016 1:32 PM MRN: [id number] Account #: [id number] Date of Birth: [birth year] Admit Type: Inpatient Age: 65 Room: [facility] PROCEDURE RM 01 Gender: Male Note Status: Finalized Attending MD: Vilinda Boehringer , MD Procedure:         Bronchoscopy Indications:       Right upper lobe mass Providers:         Vilinda Boehringer, MD Referring MD:       Medicines:         See the Anesthesia note for documentation of the                     administered medications Complications:     No immediate complications. Estimated blood loss: Minimal Procedure:         Pre-Anesthesia Assessment:                    - A History and Physical has been performed. Patient meds                     and allergies have been reviewed. The risks and benefits                     of the procedure and the sedation options and risks were                     discussed with the patient. All questions were answered                     and informed consent was obtained. Patient identification                     and proposed procedure were verified prior to the                     procedure. Mental Status Examination: normal. After                     reviewing the risks and benefits, the patient was deemed                     in satisfactory condition to undergo the procedure. The                     anesthesia plan was to use general anesthesia. Immediately                     prior to administration of medications, the patient was                     re-assessed for adequacy to receive sedatives. The heart                     rate, respiratory rate, oxygen saturations, blood                     pressure, adequacy of pulmonary ventilation, and response                     to care were monitored throughout the procedure. The                     physical status of the patient was re-assessed after the  procedure.                    After  obtaining informed consent, the bronchoscope was                     passed under direct vision. Throughout the procedure, the                     patient's blood pressure, pulse, and oxygen saturations                     were monitored continuously. the Bronchoscope Olympus                     [id number] was introduced through the nose, via                     the endotracheal tube (the patient was intubated for the                     procedure) and advanced to the tracheobronchial tree. The                     procedure was accomplished without difficulty. Findings:      The nasopharynx/oropharynx appears normal. The larynx appears normal.       The vocal cords appear normal. The subglottic space is normal. The       trachea is of normal caliber. The carina is sharp. The tracheobronchial       tree of the left lung was examined to at least the first subsegmental       level. Bronchial mucosa and anatomy in the left lung are normal; there       are no endobronchial lesions, and no secretions.      Right Lung Abnormalities: Extrinsic compression was found in the right       middle lobe. The airway lumen is about 75% occluded. The lesion was not       traversed. RUL anterior segement with endobronchial lesion 95% occlusion.      Procedure: TBBx FNA of anterior segment of RUL, forceps biopsy of RUL       anterior segment endobronchial lesion Impression:        - Right upper lobe mass                    - RUL anterior segement with endobronchial lesion 95%                     occlusion.                    - The left lung was normal.                    - Extrinsic compression was found in the right middle lobe                     secondary to posterior mass effect.                    - No specimens collected. Recommendation:    - Await BAL, biopsy, culture and cytology results. Vilinda Boehringer, MD 03/30/2016 2:18:13 PM This report has been signed electronically. Number of Addenda:  0 Note Initiated On: 03/30/2016 1:32 PM      [facility]

## 2016-03-30 NOTE — Progress Notes (Signed)
Pt returns from broch, VSS, no complaints of pain. I will continue to assess.

## 2016-03-30 NOTE — Anesthesia Procedure Notes (Signed)
Procedure Name: Intubation Date/Time: 03/30/2016 1:12 PM Performed by: Silvana Newness Pre-anesthesia Checklist: Patient identified, Emergency Drugs available, Suction available, Patient being monitored and Timeout performed Patient Re-evaluated:Patient Re-evaluated prior to inductionOxygen Delivery Method: Circle system utilized Preoxygenation: Pre-oxygenation with 100% oxygen Intubation Type: IV induction Ventilation: Mask ventilation without difficulty Laryngoscope Size: Mac and 4 Grade View: Grade I Tube type: Oral Tube size: 8.5 mm Number of attempts: 1 Airway Equipment and Method: Stylet Placement Confirmation: ETT inserted through vocal cords under direct vision,  positive ETCO2 and breath sounds checked- equal and bilateral Secured at: 20 cm Tube secured with: Tape Dental Injury: Teeth and Oropharynx as per pre-operative assessment

## 2016-03-31 ENCOUNTER — Encounter: Payer: Self-pay | Admitting: Internal Medicine

## 2016-03-31 ENCOUNTER — Encounter
Admission: EM | Discharge status: Against Medical Advice | Payer: Self-pay | Source: Home / Self Care | Attending: Internal Medicine

## 2016-03-31 ENCOUNTER — Inpatient Hospital Stay: Payer: Medicare Other

## 2016-03-31 ENCOUNTER — Other Ambulatory Visit: Payer: Self-pay | Admitting: Hematology and Oncology

## 2016-03-31 ENCOUNTER — Inpatient Hospital Stay
Admit: 2016-03-31 | Discharge: 2016-03-31 | Disposition: A | Discharge status: Home / Self Care | Payer: Medicare Other | Attending: Radiation Oncology | Admitting: Radiation Oncology

## 2016-03-31 DIAGNOSIS — C3411 Malignant neoplasm of upper lobe, right bronchus or lung: Secondary | ICD-10-CM

## 2016-03-31 DIAGNOSIS — C7951 Secondary malignant neoplasm of bone: Secondary | ICD-10-CM | POA: Insufficient documentation

## 2016-03-31 LAB — MISC LABCORP TEST (SEND OUT): Labcorp test code: 16400

## 2016-03-31 SURGERY — ECHOCARDIOGRAM, TRANSESOPHAGEAL
Anesthesia: Moderate Sedation

## 2016-03-31 MED ORDER — ATORVASTATIN CALCIUM 40 MG PO TABS: 40.0000 mg | ORAL_TABLET | Freq: Every day | ORAL | Status: DC

## 2016-03-31 MED ORDER — ASPIRIN EC 325 MG PO TBEC: 325.0000 mg | DELAYED_RELEASE_TABLET | Freq: Every day | ORAL | Status: DC

## 2016-03-31 MED ORDER — GADOBENATE DIMEGLUMINE 529 MG/ML IV SOLN
15.0000 mL | Freq: Once | INTRAVENOUS | Status: AC | PRN
  Administered 2016-03-31: 12 mL via INTRAVENOUS

## 2016-03-31 MED ORDER — ASPIRIN 325 MG PO TABS: 325.0000 mg | ORAL_TABLET | Freq: Every day | ORAL | Status: DC

## 2016-03-31 MED ORDER — ATORVASTATIN CALCIUM 20 MG PO TABS: 40.0000 mg | ORAL_TABLET | Freq: Every day | ORAL | Status: DC

## 2016-03-31 NOTE — Progress Notes (Signed)
MD Bridgett Larsson was notified pt wants to go AMA. AMA form was signed. Pt educated on all the risk. Pt promises to keep oncology appointment for Monday. IV removed. Pt going home. Pt has no further concerns at this time.

## 2016-03-31 NOTE — Progress Notes (Signed)
Ok to not have IV due to d/c after MRI per MD chen

## 2016-03-31 NOTE — Consult Note (Signed)
Except an outstanding is perfect of Radiation Oncology NEW PATIENT EVALUATION  Name: Gerald [name]  MRN: [id number]  Date:   03/27/2016     DOB: 08-15-1951   This 66 y.o. male patient presents to the clinic for initial evaluation of at least stage IIIB (T4 NX M0). Non-small cell carcinoma of the right upper lobe with possible early SVC  REFERRING PHYSICIAN: No ref. provider found  CHIEF COMPLAINT:  Chief Complaint  Patient presents with  . Abnormal Lab    DIAGNOSIS: The primary encounter diagnosis was Lung mass. Diagnoses of Pain, Lymphoproliferative disease ([place]), Lung cancer ([place]), and Encounter for imaging to screen for metal prior to MRI were also pertinent to this visit.   PREVIOUS INVESTIGATIONS:  CT scans reviewed Cytology report reviewed Clinical notes reviewed  HPI: Patient is a 65 year old male admitted with 30 pound weight loss anorexia cough and shortness of breath found to have an 8 cm mass on CT scan in the medial right upper lobe suspicious for primary bronchogenic carcinoma. Mass abutted the mediastinum and possibly invading the left atrium. He also had associated widespread thoracic lymphadenopathy. He also is a 2.6 cm mass in the medial segment of the left hepatic lobe worrisome for hepatic metastasis. Patient underwent bronchoscopy noting a mass endobronchial lesion seen in the right upper lobe with cytology positive for non-small cell lung cancer. Patient is MRI scan of his brain scheduled. Interestingly patient does have hyper eosinophilia and was admitted to the emergency room with elevated troponins. He's had intermittent pain in his left lower extremity of unknown etiology this may be revealed on his brain MRI. Patient denies cough hemoptysis or chest tightness. He is developing some slight venous jugular distention. No facial plethora is noted. The lymphadenopathy in his thorax is mostly contained to the right paratracheal region as well as a subcarinal  region.  PLANNED TREATMENT REGIMEN: Concurrent chemotherapy and radiation therapy  PAST MEDICAL HISTORY:  has a past medical history of Arthritis; Pneumothorax; Renal disorder; Mass of lung; and Collagen vascular disease ([place]).    PAST SURGICAL HISTORY:  Past Surgical History  Procedure Laterality Date  . Ureter repair as a child      FAMILY HISTORY: family history includes Brain cancer in his mother; Cancer in his sister; Lung cancer in his maternal uncle.  SOCIAL HISTORY:  reports that he quit smoking about 1 months ago. His smoking use included Cigarettes. He has a 13 pack-year smoking history. He does not have any smokeless tobacco history on file. He reports that he drinks alcohol. He reports that he does not use illicit drugs.  ALLERGIES: Nsaids and Sulfa antibiotics  MEDICATIONS:  Current Facility-Administered Medications  Medication Dose Route Frequency Provider Last Rate Last Dose  . 0.9 %  sodium chloride infusion   Intravenous Continuous Aletta Edouard, MD   Stopped at 03/29/16 1024  . 0.9 %  sodium chloride infusion   Intravenous Continuous Ryan M Dunn, PA-C      . acetaminophen (TYLENOL) tablet 650 mg  650 mg Oral Q6H PRN Gladstone Lighter, MD       Or  . acetaminophen (TYLENOL) suppository 650 mg  650 mg Rectal Q6H PRN Gladstone Lighter, MD      . enoxaparin (LOVENOX) injection 40 mg  40 mg Subcutaneous Q24H Vaughan Basta, MD   40 mg at 03/30/16 2213  . feeding supplement (ENSURE ENLIVE) (ENSURE ENLIVE) liquid 237 mL  237 mL Oral BID BM Vaughan Basta, MD   237 mL at  03/29/16 1325  . fentaNYL (SUBLIMAZE) injection 25 mcg  25 mcg Intravenous Q5 min PRN Gunnar Bulla, MD   25 mcg at 03/30/16 1446  . fentaNYL (SUBLIMAZE) injection 25 mcg  25 mcg Intravenous Q5 min PRN Gunnar Bulla, MD      . ondansetron [facility]) tablet 4 mg  4 mg Oral Q6H PRN Gladstone Lighter, MD       Or  . ondansetron (ZOFRAN) injection 4 mg  4 mg Intravenous Q6H PRN Gladstone Lighter, MD       . ondansetron (ZOFRAN) injection 4 mg  4 mg Intravenous Once PRN Gunnar Bulla, MD      . ondansetron [facility]) injection 4 mg  4 mg Intravenous Once PRN Gunnar Bulla, MD      . oxyCODONE (Oxy IR/ROXICODONE) immediate release tablet 5 mg  5 mg Oral Q4H PRN Gladstone Lighter, MD   5 mg at 03/29/16 2129  . pneumococcal 23 valent vaccine (PNU-IMMUNE) injection 0.5 mL  0.5 mL Intramuscular Tomorrow-1000 Gladstone Lighter, MD   0.5 mL at 03/29/16 1000  . sodium chloride flush (NS) 0.9 % injection 3 mL  3 mL Intravenous Q12H Gladstone Lighter, MD   3 mL at 03/31/16 1000    ECOG PERFORMANCE STATUS:  1 - Symptomatic but completely ambulatory  REVIEW OF SYSTEMS: Except for the extreme cachexia and weight loss pain in his left lower extremity Patient denies any weight loss, fatigue, weakness, fever, chills or night sweats. Patient denies any loss of vision, blurred vision. Patient denies any ringing  of the ears or hearing loss. No irregular heartbeat. Patient denies heart murmur or history of fainting. Patient denies any chest pain or pain radiating to her upper extremities. Patient denies any shortness of breath, difficulty breathing at night, cough or hemoptysis. Patient denies any swelling in the lower legs. Patient denies any nausea vomiting, vomiting of blood, or coffee ground material in the vomitus. Patient denies any stomach pain. Patient states has had normal bowel movements no significant constipation or diarrhea. Patient denies any dysuria, hematuria or significant nocturia. Patient denies any problems walking, swelling in the joints or loss of balance. Patient denies any skin changes, loss of hair or loss of weight. Patient denies any excessive worrying or anxiety or significant depression. Patient denies any problems with insomnia. Patient denies excessive thirst, polyuria, polydipsia. Patient denies any swollen glands, patient denies easy bruising or easy bleeding. Patient denies any recent  infections, allergies or URI. Patient "s visual fields have not changed significantly in recent time.    PHYSICAL EXAM: BP 93/47 mmHg  Pulse 83  Temp(Src) 97.7 F (36.5 C) (Oral)  Resp 18  Ht '5\' 11"'$  (1.803 m)  Wt 131 lb 1.6 oz (59.467 kg)  BMI 18.29 kg/m2  SpO2 95% Thin Male in NAD. There is some slight venous jugular distention in the cervical vessels. No facial plethora is noted. No collateral circulation is identified. Well-developed well-nourished patient in NAD. HEENT reveals PERLA, EOMI, discs not visualized.  Oral cavity is clear. No oral mucosal lesions are identified. Neck is clear without evidence of cervical or supraclavicular adenopathy. Lungs are clear to A&P. Cardiac examination is essentially unremarkable with regular rate and rhythm without murmur rub or thrill. Abdomen is benign with no organomegaly or masses noted. Motor sensory and DTR levels are equal and symmetric in the upper and lower extremities. Cranial nerves II through XII are grossly intact. Proprioception is intact. No peripheral adenopathy or edema is identified. No motor or sensory levels  are noted. Crude visual fields are within normal range.  LABORATORY DATA: Cytology report is pending although by verbal report this is a non-small cell lung cancer.    RADIOLOGY RESULTS: MRI scans of brain ordered and will review when available, CT scan of the chest reviewed, PET CT scan to be ordered   IMPRESSION: At least stage IIIB right upper lobe non-small cell lung cancer with possible early SVC in 65 year old male  PLAN: At this time like to start on a fairly emergent basis radiation therapy to his right upper lobe lesion and mediastinum. Would plan on delivering 4000 cGy over 4 weeks evaluate for response. I would like to push prevent any atelectasis of the lung, hemoptysis or further SVC. Risks and benefits of radiation therapy were discussed with the patient. Side effects including alteration of blood counts,  radiation esophagitis, fatigue, loss of normal lung volume all were discussed in detail with the patient. He will be discharged in the near future and will see him as an outpatient Monday at which time I have personally ordered and scheduled CT simulation. We'll coordinate also his chemotherapy with medical oncology. We'll also review his brain MRI scan when available.  I would like to take this opportunity for allowing me to participate in the care of your patient.Armstead Peaks., MD

## 2016-03-31 NOTE — Care Management Important Message (Signed)
Important Message  Patient Details  Name: Gerald [name] MRN: [id number] Date of Birth: 16-Jun-1951   Medicare Important Message Given:  Yes    Gerald [name] 03/31/2016, 10:53 AM

## 2016-03-31 NOTE — Discharge Summary (Signed)
[facility] [place] NAME: Gerald [name]    MR#:  [id number]  DATE OF BIRTH:  1951-02-23  DATE OF ADMISSION:  03/27/2016 ADMITTING PHYSICIAN: Gladstone Lighter, MD  DATE OF DISCHARGE: 03/31/2016  3:06 PM  PRIMARY CARE PHYSICIAN: No PCP Per Patient    ADMISSION DIAGNOSIS:  Lung mass [R91.8] Pain [R52] Lymphoproliferative disease ([place]) [D47.9]   DISCHARGE DIAGNOSIS:  Non-small cell lung carcinoma with metastasis to brain,  Bone, heart ([place]) and possible liver. Leukocytosis Subacute CVA. elevated troponin SECONDARY DIAGNOSIS:   Past Medical History  Diagnosis Date  . Arthritis     Rheumatoid arthritis  . Pneumothorax     spontaneous  . Renal disorder     as a child  . Mass of lung   . Collagen vascular disease ([place])     RA    HOSPITAL COURSE:   #1 elevated troponin- Likely demand ischemia and strain on the heart from the lung mass possibly abutting the left atrium - recycle troponins stable. PVCs on monitor- monitored on tele - ECHO: LV EF: 50% - 55%, a mass in LA.  #2 Non-small cell lung carcinoma with metastasis to brain,  bone and possible liver.  CT: Right lung mass, 8cm, possible liver metastasis. Bronch: Right upper lobe mass, RUL anterior segement with endobronchial lesion 95% occlusion. ECHO: LV EF: 50% - 55%, a mass in LA, no need of TEE per Dr. Fletcher Anon. MRI brian: early metastatic disease to the brain and cervical spine. Per Dr. Susy Manor,  PET scan as outpatient, chemotherapy and radiation as outpatient.   #3 leukocytosis-likely underlying lymphoproliferative disorder. White count is still high. S/p Bone marrow biopsy. No evidence of any infection. Follow-up Dr. Susy Manor as outpatient.  #4 rheumatoid arthritis-not following with any physician. No arthritis symptoms or flares at this time.  *Subacute CVA. Per MRI report. But the patient has no symptoms. Neurology examination is normal. I discussed with Dr.  Susy Manor, she agrees with starting aspirin and Lipitor but suggested neurology consult before discharge.  I discussed with Dr. Doy Mince. Per Dr. Doy Mince, hold discharge and get CVA workup. But the patient refused to stay in the hospital in the left AMA.  DISCHARGE CONDITIONS:   The patient left AMA.  CONSULTS OBTAINED:  Treatment Team:  Lequita Asal, MD Vilinda Boehringer, MD Cammie Sickle, MD  DRUG ALLERGIES:   Allergies  Allergen Reactions  . Nsaids Other (See Comments)    Pt is unable to take this class of medications.    . Sulfa Antibiotics Other (See Comments)    Reaction:  GI upset     DISCHARGE MEDICATIONS:  There are no discharge medications for this patient.    DISCHARGE INSTRUCTIONS:    If you experience worsening of your admission symptoms, develop shortness of breath, life threatening emergency, suicidal or homicidal thoughts you must seek medical attention immediately by calling 911 or calling your MD immediately  if symptoms less severe.  You Must read complete instructions/literature along with all the possible adverse reactions/side effects for all the Medicines you take and that have been prescribed to you. Take any new Medicines after you have completely understood and accept all the possible adverse reactions/side effects.   Please note  You were cared for by a hospitalist during your hospital stay. If you have any questions about your discharge medications or the care you received while you were in the hospital after you are discharged, you can call the unit  and asked to speak with the hospitalist on call if the hospitalist that took care of you is not available. Once you are discharged, your primary care physician will handle any further medical issues. Please note that NO REFILLS for any discharge medications will be authorized once you are discharged, as it is imperative that you return to your primary care physician (or establish a relationship  with a primary care physician if you do not have one) for your aftercare needs so that they can reassess your need for medications and monitor your lab values.    Today   SUBJECTIVE   No complaint.   VITAL SIGNS:  Blood pressure 93/47, pulse 83, temperature 97.7 F (36.5 C), temperature source Oral, resp. rate 18, height 5' 11"  (1.803 m), weight 59.467 kg (131 lb 1.6 oz), SpO2 95 %.  I/O:   Intake/Output Summary (Last 24 hours) at 03/31/16 1506 Last data filed at 03/31/16 1130  Gross per 24 hour  Intake    240 ml  Output   1500 ml  Net  -1260 ml    PHYSICAL EXAMINATION:  GENERAL:  65 y.o.-year-old patient lying in the bed with no acute distress.  EYES: Pupils equal, round, reactive to light and accommodation. No scleral icterus. Extraocular muscles intact.  HEENT: Head atraumatic, normocephalic. Oropharynx and nasopharynx clear.  NECK:  Supple, no jugular venous distention. No thyroid enlargement, no tenderness.  LUNGS: Normal breath sounds bilaterally, no wheezing, rales,rhonchi or crepitation. No use of accessory muscles of respiration.  CARDIOVASCULAR: S1, S2 normal. No murmurs, rubs, or gallops.  ABDOMEN: Soft, non-tender, non-distended. Bowel sounds present. No organomegaly or mass.  EXTREMITIES: No pedal edema, cyanosis, or clubbing.  NEUROLOGIC: Cranial nerves II through XII are intact. Muscle strength 5/5 in all extremities. Sensation intact. Gait not checked.  PSYCHIATRIC: The patient is alert and oriented x 3.  SKIN: No obvious rash, lesion, or ulcer.   DATA REVIEW:   CBC  Recent Labs Lab 03/31/16 0403  WBC 41.1*  HGB 11.9*  HCT 36.5*  PLT 254    Chemistries   Recent Labs Lab 03/27/16 1303 03/28/16 0424  NA 132* 134*  K 3.8 4.0  CL 102 106  CO2 25 24  GLUCOSE 88 86  BUN 12 9  CREATININE 0.86 0.95  CALCIUM 8.2* 8.2*  MG  --  1.8  AST 30  --   ALT 28  --   ALKPHOS 85  --   BILITOT 0.8  --     Cardiac Enzymes  Recent Labs Lab  03/28/16 0424  TROPONINI 0.08*    Microbiology Results  Results for orders placed or performed during the hospital encounter of 03/27/16  Culture, bal-quantitative     Status: None (Preliminary result)   Collection Time: 03/30/16  2:26 PM  Result Value Ref Range Status   Specimen Description BRONCHIAL WASHINGS  Final   Special Requests Normal  Final   Gram Stain PENDING  Incomplete   Culture NO GROWTH < 24 HOURS  Final   Report Status PENDING  Incomplete    RADIOLOGY:  Dg Eye Foreign Body  03/31/2016  CLINICAL DATA:  Metal working/exposure; clearance prior to MRI EXAM: ORBITS FOR FOREIGN BODY - 2 VIEW COMPARISON:  None. FINDINGS: Water's views with eyes deviated toward the left and toward the right were obtained. No intraorbital radiopaque foreign body. There is mucosal thickening in the right maxillary antrum. Other paranasal sinuses are clear. No fracture or dislocation. IMPRESSION: No evidence of metallic foreign  body within the orbits. Evidence of right maxillary sinusitis. Electronically Signed   By: Lowella Grip III M.D.   On: 03/31/2016 11:53   Mr Jeri Cos GE Contrast  03/31/2016  CLINICAL DATA:  65 year old male with newly diagnosed lung mass. Asymptomatic. Staging. Subsequent encounter. EXAM: MRI HEAD WITHOUT AND WITH CONTRAST TECHNIQUE: Multiplanar, multiecho pulse sequences of the brain and surrounding structures were obtained without and with intravenous contrast. CONTRAST:  36m MULTIHANCE GADOBENATE DIMEGLUMINE 529 MG/ML IV SOLN COMPARISON:  Chest CTA 03/26/2016. FINDINGS: There is a cluster of nodular and curvilinear foci of abnormal trace diffusion signal in the anterior right MCA territory involving subcortical white matter and small areas of cortex along the right middle and superior frontal gyri (series 100, images 31 through 38. Most of these areas appear mildly facilitated on ADC, but some are mildly restricted (series 4, image 34). There are few similar small areas in  the left hemisphere, primarily in the posterior left MCA and left MCA/ PCA watershed areas (series 100). An there is a small cluster of similar foci in the posterior left cerebellar hemisphere (series 100, image 16). Associated mild T2 and FLAIR hyperintensity in these areas with no associated hemorrhage or mass effect. No associated mass like enhancement at these sites, although there is a small in gyral focus of enhancement in the right middle frontal gyrus on series 12, image 45. No edema at this site. However, there is a rounded 7 mm area of abnormal enhancement in the posterior right cerebellar hemisphere with less distinct T2, FLAIR, and trace diffusion hyperintensity. Still, this is somewhat wedge-shaped (series 14, image 11). Major intracranial vascular flow voids are preserved with dominant distal right vertebral artery. No other abnormal intracranial enhancement. No intracranial mass effect. No dural thickening. There is a subtle left parietal lobe developmental venous anomaly (series 13, image 7). No ventriculomegaly. No acute or chronic blood products. Mild for age additional scattered nonspecific cerebral white matter T2 and FLAIR hyperintensity. Negative pituitary, cervicomedullary junction, and visualized cervical spinal cord. Visible internal auditory structures appear normal. Mastoids are clear. Negative orbit and scalp soft tissues. Abnormal bone marrow signal with heterogeneous diffusion signal in the cervical spine, especially in the right C2 articular pillar (18 mm) which is consistent with metastasis. Heterogeneous calvarium marrow signal without destructive skull lesion. IMPRESSION: 1. Findings indeterminate for early metastatic disease to the brain at this time: A small focus of gyral enhancement in the anterior right frontal lobe likely is post ischemic, while a small posterior right cerebellar enhancing lesion is more suspicious for small brain metastasis(series 12, image 17). Recommend  re-evaluation on a repeat study in 1-2 months. 2. Multiple small primarily subacute appearing infarcts in the bilateral MCA and left PICA territories. No associated hemorrhage or mass effect. 3. Positive for osseous metastatic disease in the cervical spine. Heterogeneous marrow signal in the skull, attention directed on the follow-up. Electronically Signed   By: HGenevie AnnM.D.   On: 03/31/2016 13:23   Dg C-arm 1-60 Min-no Report  03/30/2016  CLINICAL DATA: bronchoscopy,lesion of RUL C-ARM 1-60 MINUTES Fluoroscopy was utilized by the requesting physician.  No radiographic interpretation.        Management plans discussed with the patient, family and they are in agreement.  CODE STATUS:     Code Status Orders        Start     Ordered   03/27/16 2044  Full code   Continuous     03/27/16 2043  Code Status History    Date Active Date Inactive Code Status Order ID Comments User Context   This patient has a current code status but no historical code status.      TOTAL TIME TAKING CARE OF THIS PATIENT:42 minutes.    Demetrios Loll M.D on 03/31/2016 at 3:06 PM  Between 7am to 6pm - Pager - [phone]  After 6pm go to [email or web address] - password [email or web address] [facility]  [facility] [place] [facility]  Office  [phone]  CC: Primary care physician; No PCP Per Patient

## 2016-03-31 NOTE — Care Management (Signed)
Medication management not an option because patient has medicare and is no in his medicare coverage gap.

## 2016-03-31 NOTE — Care Management (Signed)
Reviewed AVS. No discharge medications prescribed. No needs identified.

## 2016-03-31 NOTE — Progress Notes (Signed)
I reviewed the patient's echo and CT images. I don't think that a TEE is going to change his management unless he is going for surgical resection. Best option is probably to proceed with radiation and chemotherapy with repeat echo after. The patient is at risk for embolization. Please call with questions.

## 2016-03-31 NOTE — Progress Notes (Signed)
Nutrition Follow-up  DOCUMENTATION CODES:   Severe malnutrition in context of acute illness/injury  INTERVENTION:  Monitor intake  Continue ensure enlive BID for added nutrition   NUTRITION DIAGNOSIS:   Increased nutrient needs related to cancer and cancer related treatments as evidenced by estimated needs.    GOAL:   Patient will meet greater than or equal to 90% of their needs  Improving  MONITOR:   PO intake, Supplement acceptance  REASON FOR ASSESSMENT:   Malnutrition Screening Tool    ASSESSMENT:   65 y/o male admitted with right lung mass, elevated troponin.    Pt eating 90% of meals, tray observed today for breakfast and drinking 100% of ensure. Medications and labs reviewed  Diet Order:  Diet regular Room service appropriate?: Yes; Fluid consistency:: Thin Diet - low sodium heart healthy  Skin:  Reviewed, no issues  Last BM:  4/19  Height:   Ht Readings from Last 1 Encounters:  03/27/16 '5\' 11"'$  (1.803 m)    Weight:   Wt Readings from Last 1 Encounters:  03/27/16 131 lb 1.6 oz (59.467 kg)    Ideal Body Weight:     BMI:  Body mass index is 18.29 kg/(m^2).  Estimated Nutritional Needs:   Kcal:  4235-3614 kcals/d.  Protein:  59-71 g/d  Fluid:  1.7-2 L/d  EDUCATION NEEDS:   No education needs identified at this time  Shaylynne Lunt B. Zenia Resides, [place], [place] (pager) Weekend/On-Call pager [phone])

## 2016-03-31 NOTE — Anesthesia Postprocedure Evaluation (Signed)
Anesthesia Post Note  Patient: Gerald [name]  Procedure(s) Performed: Procedure(s) (LRB): FLEXIBLE BRONCHOSCOPY (N/A)  Patient location during evaluation: Endoscopy Anesthesia Type: General Level of consciousness: awake and alert Pain management: pain level controlled Vital Signs Assessment: post-procedure vital signs reviewed and stable Respiratory status: spontaneous breathing, nonlabored ventilation, respiratory function stable and patient connected to nasal cannula oxygen Cardiovascular status: blood pressure returned to baseline and stable Postop Assessment: no signs of nausea or vomiting Anesthetic complications: no    Last Vitals:  Filed Vitals:   03/30/16 1919 03/31/16 0355  BP: 84/65 93/47  Pulse: 97 83  Temp: 36.4 C 36.5 C  Resp: 20 18    Last Pain:  Filed Vitals:   03/31/16 0739  PainSc: 0-No pain                 Ayala Ribble S

## 2016-03-31 NOTE — Discharge Instructions (Signed)
Regular diet. °Activity as tolerated. °

## 2016-03-31 NOTE — Progress Notes (Signed)
Talked to MD Arida, no TEE today. CT will not do PET scan inpatient. No further orders at this time.

## 2016-03-31 NOTE — Progress Notes (Signed)
MD Cocharan was paged, instructed to hold patient. A & O. Ambulating in the room and tolerated it well. NO tele. Room air. Pt reports no chest pain. Pt has no further concerns at this time.

## 2016-03-31 NOTE — Progress Notes (Signed)
[facility]  Date of admission: 03/27/2016  Inpatient day: 03/30/2016  Consulting physician: Dr. Roland Rack   Reason for Consultation: Lymphoproliferative disorder  Chief Complaint: Gerald [name] is a 65 y.o. male with a right lung mass and hypereosinophilia who was admitted through the emergency room with elevated troponins.  Subjective:  Patient denies any shortness of breath or chest pain.  Continue to have symptoms in his left lower extremity, worse on lying down.   Past Medical History  Diagnosis Date  . Arthritis     Rheumatoid arthritis  . Pneumothorax     spontaneous  . Renal disorder     as a child  . Mass of lung   . Collagen vascular disease ([place])     RA    Past Surgical History  Procedure Laterality Date  . Ureter repair as a child      Family History  Problem Relation Age of Onset  . Brain cancer Mother   . Lung cancer Maternal Uncle   . Cancer Sister     Metastatic cancer- unknown primary    Social History:  reports that he quit smoking about 1 months ago. His smoking use included Cigarettes. He has a 13 pack-year smoking history. He does not have any smokeless tobacco history on file. He reports that he drinks alcohol. He reports that he does not use illicit drugs.  He lives in [place].  Contact number is [phone].  The patient is alone today.  Allergies:  Allergies  Allergen Reactions  . Nsaids Other (See Comments)    Pt is unable to take this class of medications.    . Sulfa Antibiotics Other (See Comments)    Reaction:  GI upset     Current Medications: Current Facility-Administered Medications  Medication Dose Route Frequency Provider Last Rate Last Dose  . 0.9 %  sodium chloride infusion   Intravenous Continuous Aletta Edouard, MD   Stopped at 03/29/16 1024  . 0.9 %  sodium chloride infusion   Intravenous Continuous Ryan M Dunn, PA-C      . acetaminophen (TYLENOL) tablet 650 mg  650 mg Oral Q6H PRN  Gladstone Lighter, MD       Or  . acetaminophen (TYLENOL) suppository 650 mg  650 mg Rectal Q6H PRN Gladstone Lighter, MD      . enoxaparin (LOVENOX) injection 40 mg  40 mg Subcutaneous Q24H Vaughan Basta, MD   40 mg at 03/30/16 2213  . feeding supplement (ENSURE ENLIVE) (ENSURE ENLIVE) liquid 237 mL  237 mL Oral BID BM Vaughan Basta, MD   237 mL at 03/29/16 1325  . fentaNYL (SUBLIMAZE) injection 25 mcg  25 mcg Intravenous Q5 min PRN Gunnar Bulla, MD   25 mcg at 03/30/16 1446  . fentaNYL (SUBLIMAZE) injection 25 mcg  25 mcg Intravenous Q5 min PRN Gunnar Bulla, MD      . ondansetron [facility]) tablet 4 mg  4 mg Oral Q6H PRN Gladstone Lighter, MD       Or  . ondansetron (ZOFRAN) injection 4 mg  4 mg Intravenous Q6H PRN Gladstone Lighter, MD      . ondansetron (ZOFRAN) injection 4 mg  4 mg Intravenous Once PRN Gunnar Bulla, MD      . ondansetron [facility]) injection 4 mg  4 mg Intravenous Once PRN Gunnar Bulla, MD      . oxyCODONE (Oxy IR/ROXICODONE) immediate release tablet 5 mg  5 mg Oral Q4H PRN Gladstone Lighter, MD  5 mg at 03/29/16 2129  . pneumococcal 23 valent vaccine (PNU-IMMUNE) injection 0.5 mL  0.5 mL Intramuscular Tomorrow-1000 Gladstone Lighter, MD   0.5 mL at 03/29/16 1000  . sodium chloride flush (NS) 0.9 % injection 3 mL  3 mL Intravenous Q12H Gladstone Lighter, MD   3 mL at 03/30/16 2200    Review of Systems:  GENERAL:  Fatigue.  No fevers or sweats.  Weight loss of 30 pounds since 12/2015. PERFORMANCE STATUS (ECOG):  1 HEENT:  Blurry vision if goes outside.  Last eye check 8-10 years ago.  Pollen allergy.  No sore throat, mouth sores or tenderness. Lungs:  Shortness of breath.  Cough productive of clear/white/gray sputum.  Scant hemoptysis x 1. Cardiac:  No chest pain, palpitations, orthopnea, or PND.  Sleeps in a recliner. GI:  Poor appetite which has improved recently.  No nausea, vomiting, diarrhea, constipation, melena or hematochezia. GU:  No urgency,  frequency, dysuria, or hematuria. Musculoskeletal:  No back pain.  No joint pain.  No muscle tenderness. Extremities:  No pain or swelling. Skin:  No rashes or skin changes. Neuro:  Seldom headache.  Little numbness bottom of left foot extends upward slightly (sock distribution). No weakness, balance or coordination issues. Endocrine:  No diabetes, thyroid issues, hot flashes or night sweats. Psych:  No mood changes, depression or anxiety. Pain:  No focal pain. Review of systems:  All other systems reviewed and found to be negative.  Physical Exam: Blood pressure 93/47, pulse 83, temperature 97.7 F (36.5 C), temperature source Oral, resp. rate 18, height _0  (1.803 m), weight 131 lb 1.6 oz (59.467 kg), SpO2 95 %. GENERAL:  Thin gentleman sitting comfortably on the medical unit in no acute distress. MENTAL STATUS:  Alert and oriented to person, place and time. HEAD:  Long gray hair in pony tail.  Goatee.  Temporal wasting.  Normocephalic, atraumatic, face symmetric, no Cushingoid features. EYES:  Blue eyes.  Pupils equal round and reactive to light and accomodation.  No conjunctivitis or scleral icterus. ENT:  Oropharynx clear without lesion.  Edentulous except 1 tooth.  Tongue normal. Mucous membranes moist.  RESPIRATORY:  Clear to auscultation without rales, wheezes or rhonchi. CARDIOVASCULAR:  Intermittent irregular rate and rhythm without murmur, rub or gallop. ABDOMEN:  Soft, non-tender, with active bowel sounds, and no hepatosplenomegaly.  No masses. SKIN:  o rashes, ulcers or lesions. EXTREMITIES: No edema, no skin discoloration or tenderness.  No palpable cords. LYMPH NODES: No palpable cervical, supraclavicular, axillary or inguinal adenopathy  NEUROLOGICAL: Normal. PSYCH:  Appropriate.   Admission on 03/27/2016  Component Date Value Ref Range Status  . Troponin I 03/27/2016 0.07* <0.031 ng/mL Final   Comment: PREVIOUS RESULT CALLED TO BRANDY MOYA 03/27/16 AT 1434 BY [place].  KLK        PERSISTENTLY INCREASED TROPONIN VALUES IN THE RANGE OF 0.04-0.49 ng/mL CAN BE SEEN IN:       -UNSTABLE ANGINA       -CONGESTIVE HEART FAILURE       -MYOCARDITIS       -CHEST TRAUMA       -ARRYHTHMIAS       -LATE PRESENTING MYOCARDIAL INFARCTION       -COPD   CLINICAL FOLLOW-UP RECOMMENDED.   . Weight 03/28/2016 2097.6   Final-Edited  . Height 03/28/2016 71   Final-Edited  . BP 03/28/2016 105/67   Final-Edited  . Color, Urine 03/27/2016 YELLOW* YELLOW Final  . APPearance 03/27/2016 CLEAR* CLEAR Final  .  Glucose, UA 03/27/2016 NEGATIVE  NEGATIVE mg/dL Final  . Bilirubin Urine 03/27/2016 NEGATIVE  NEGATIVE Final  . Ketones, ur 03/27/2016 NEGATIVE  NEGATIVE mg/dL Final  . Specific Gravity, Urine 03/27/2016 1.020  1.005 - 1.030 Final  . Hgb urine dipstick 03/27/2016 NEGATIVE  NEGATIVE Final  . pH 03/27/2016 5.0  5.0 - 8.0 Final  . Protein, ur 03/27/2016 NEGATIVE  NEGATIVE mg/dL Final  . Nitrite 03/27/2016 NEGATIVE  NEGATIVE Final  . Leukocytes, UA 03/27/2016 NEGATIVE  NEGATIVE Final  . RBC / HPF 03/27/2016 NONE SEEN  0 - 5 RBC/hpf Final  . WBC, UA 03/27/2016 0-5  0 - 5 WBC/hpf Final  . Bacteria, UA 03/27/2016 NONE SEEN  NONE SEEN Final  . Squamous Epithelial / LPF 03/27/2016 0-5* NONE SEEN Final  . Mucous 03/27/2016 PRESENT   Final  . Troponin I 03/27/2016 0.07* <0.031 ng/mL Final   Comment: PREVIOUS RESULT CALLED TO BRANDY MOYA 03/27/16 AT 1434 BY [facility]/KLK.        PERSISTENTLY INCREASED TROPONIN VALUES IN THE RANGE OF 0.04-0.49 ng/mL CAN BE SEEN IN:       -UNSTABLE ANGINA       -CONGESTIVE HEART FAILURE       -MYOCARDITIS       -CHEST TRAUMA       -ARRYHTHMIAS       -LATE PRESENTING MYOCARDIAL INFARCTION       -COPD   CLINICAL FOLLOW-UP RECOMMENDED.   Marland Kitchen Troponin I 03/28/2016 0.06* <0.031 ng/mL Final   Comment: PREVIOUS RESULT CALLED TO BRANDY MOYA ON 03/27/16 AT 1434 BY [facility]/TLB        PERSISTENTLY INCREASED TROPONIN VALUES IN THE RANGE OF 0.04-0.49 ng/mL CAN  BE SEEN IN:       -UNSTABLE ANGINA       -CONGESTIVE HEART FAILURE       -MYOCARDITIS       -CHEST TRAUMA       -ARRYHTHMIAS       -LATE PRESENTING MYOCARDIAL INFARCTION       -COPD   CLINICAL FOLLOW-UP RECOMMENDED.   Marland Kitchen Troponin I 03/28/2016 0.08* <0.031 ng/mL Final   Comment: PREVIOUS RESULT CALLED TO BRANDY MOYA ON 03/27/16 AT 1434 BY [facility]/TLB        PERSISTENTLY INCREASED TROPONIN VALUES IN THE RANGE OF 0.04-0.49 ng/mL CAN BE SEEN IN:       -UNSTABLE ANGINA       -CONGESTIVE HEART FAILURE       -MYOCARDITIS       -CHEST TRAUMA       -ARRYHTHMIAS       -LATE PRESENTING MYOCARDIAL INFARCTION       -COPD   CLINICAL FOLLOW-UP RECOMMENDED.   . TSH 03/27/2016 1.849  0.350 - 4.500 uIU/mL Final  . Magnesium 03/28/2016 1.8  1.7 - 2.4 mg/dL Final  . Sodium 03/28/2016 134* 135 - 145 mmol/L Final  . Potassium 03/28/2016 4.0  3.5 - 5.1 mmol/L Final  . Chloride 03/28/2016 106  101 - 111 mmol/L Final  . CO2 03/28/2016 24  22 - 32 mmol/L Final  . Glucose, Bld 03/28/2016 86  65 - 99 mg/dL Final  . BUN 03/28/2016 9  6 - 20 mg/dL Final  . Creatinine, Ser 03/28/2016 0.95  0.61 - 1.24 mg/dL Final  . Calcium 03/28/2016 8.2* 8.9 - 10.3 mg/dL Final  . GFR calc non Af Amer 03/28/2016 >60  >60 mL/min Final  . GFR calc Af Amer 03/28/2016 >60  >60 mL/min Final  Comment: (NOTE) The eGFR has been calculated using the CKD EPI equation. This calculation has not been validated in all clinical situations. eGFR's persistently <60 mL/min signify possible Chronic Kidney Disease.   . Anion gap 03/28/2016 4* 5 - 15 Final  . WBC 03/28/2016 71.1* 3.8 - 10.6 K/uL Final   Comment: RESULT REPEATED AND VERIFIED WHITE COUNT CONFIRMED ON SMEAR CRITICAL RESULT CALLED TO, READ BACK BY AND VERIFIED WITH: ADRIENNE WHITE ON 03/28/16 AT 0600 BY [facility]   . RBC 03/28/2016 4.16* 4.40 - 5.90 MIL/uL Final  . Hemoglobin 03/28/2016 11.9* 13.0 - 18.0 g/dL Final  . HCT 03/28/2016 37.0* 40.0 - 52.0 % Final  . MCV 03/28/2016  88.9  80.0 - 100.0 fL Final  . MCH 03/28/2016 28.6  26.0 - 34.0 pg Final  . MCHC 03/28/2016 32.1  32.0 - 36.0 g/dL Final  . RDW 03/28/2016 15.1* 11.5 - 14.5 % Final  . Platelets 03/28/2016 232  150 - 440 K/uL Final  . Neutrophils Relative % 03/28/2016 39   Final  . Lymphocytes Relative 03/28/2016 5   Final  . Monocytes Relative 03/28/2016 4   Final  . Eosinophils Relative 03/28/2016 51   Final  . Basophils Relative 03/28/2016 1   Final  . Neutro Abs 03/28/2016 27.7* 1.4 - 6.5 K/uL Final  . Lymphs Abs 03/28/2016 3.6  1.0 - 3.6 K/uL Final  . Monocytes Absolute 03/28/2016 2.8* 0.2 - 1.0 K/uL Final  . Eosinophils Absolute 03/28/2016 36.3* 0 - 0.7 K/uL Final  . Basophils Absolute 03/28/2016 0.7* 0 - 0.1 K/uL Final  . Smear Review 03/28/2016 LARGE PLATELETS PRESENT   Final  . Prothrombin Time 03/28/2016 16.0* 11.4 - 15.0 seconds Final  . INR 03/28/2016 1.27   Final  . aPTT 03/28/2016 38* 24 - 36 seconds Final   Comment:        IF BASELINE aPTT IS ELEVATED, SUGGEST PATIENT RISK ASSESSMENT BE USED TO DETERMINE APPROPRIATE ANTICOAGULANT THERAPY.   Marland Kitchen aPTT 03/29/2016 38* 24 - 36 seconds Final   Comment:        IF BASELINE aPTT IS ELEVATED, SUGGEST PATIENT RISK ASSESSMENT BE USED TO DETERMINE APPROPRIATE ANTICOAGULANT THERAPY.   . IgE (Immunoglobulin E), Serum 03/29/2016 247* 0 - 100 IU/mL Final   Comment: (NOTE) Performed At: [facility] [place], [place] [id number] Lindon Romp MD LK:[id number]   . WBC 03/29/2016 69.6* 3.8 - 10.6 K/uL Final   CRITICAL VALUE NOTED.  VALUE IS CONSISTENT WITH PREVIOUSLY REPORTED AND CALLED VALUE.  Marland Kitchen RBC 03/29/2016 4.11* 4.40 - 5.90 MIL/uL Final  . Hemoglobin 03/29/2016 11.9* 13.0 - 18.0 g/dL Final  . HCT 03/29/2016 35.9* 40.0 - 52.0 % Final  . MCV 03/29/2016 87.4  80.0 - 100.0 fL Final  . MCH 03/29/2016 29.1  26.0 - 34.0 pg Final  . MCHC 03/29/2016 33.3  32.0 - 36.0 g/dL Final  . RDW 03/29/2016 14.8* 11.5 - 14.5 % Final  .  Platelets 03/29/2016 258  150 - 440 K/uL Final  . Neutrophils Relative % 03/29/2016 38   Final  . Lymphocytes Relative 03/29/2016 5   Final  . Monocytes Relative 03/29/2016 4   Final  . Eosinophils Relative 03/29/2016 52   Final  . Basophils Relative 03/29/2016 1   Final  . Neutro Abs 03/29/2016 26.4* 1.4 - 6.5 K/uL Final  . Lymphs Abs 03/29/2016 3.5  1.0 - 3.6 K/uL Final  . Monocytes Absolute 03/29/2016 2.8* 0.2 - 1.0 K/uL Final  .  Eosinophils Absolute 03/29/2016 36.2* 0 - 0.7 K/uL Final  . Basophils Absolute 03/29/2016 0.7* 0 - 0.1 K/uL Final  . Smear Review 03/29/2016 MORPHOLOGY UNREMARKABLE   Final  . WBC 03/30/2016 70.1* 3.8 - 10.6 K/uL Final   Comment: RESULT REPEATED AND VERIFIED CRITICAL RESULT CALLED TO, READ BACK BY AND VERIFIED WITH: TIFFANY MATTOCKS @ 0536 ON 03/30/2016 BY [facility]   . RBC 03/30/2016 4.19* 4.40 - 5.90 MIL/uL Final  . Hemoglobin 03/30/2016 11.9* 13.0 - 18.0 g/dL Final  . HCT 03/30/2016 36.9* 40.0 - 52.0 % Final  . MCV 03/30/2016 88.1  80.0 - 100.0 fL Final  . MCH 03/30/2016 28.6  26.0 - 34.0 pg Final  . MCHC 03/30/2016 32.4  32.0 - 36.0 g/dL Final  . RDW 03/30/2016 15.2* 11.5 - 14.5 % Final  . Platelets 03/30/2016 258  150 - 440 K/uL Final  . Neutrophils Relative % 03/30/2016 37.000000   Final  . Lymphocytes Relative 03/30/2016 5.000000   Final  . Monocytes Relative 03/30/2016 5.000000   Final  . Eosinophils Relative 03/30/2016 53.000000   Final  . Basophils Relative 03/30/2016 0.000000   Final  . Neutro Abs 03/30/2016 25.9* 1.4 - 6.5 K/uL Final  . Lymphs Abs 03/30/2016 3.5  1.0 - 3.6 K/uL Final  . Monocytes Absolute 03/30/2016 3.5* 0.2 - 1.0 K/uL Final  . Eosinophils Absolute 03/30/2016 37.2* 0 - 0.7 K/uL Final  . Basophils Absolute 03/30/2016 0.0  0 - 0.1 K/uL Final  . Smear Review 03/30/2016 MORPHOLOGY UNREMARKABLE   Final    Assessment:  Jori Thrall is a 65 y.o. male with leukocytosis with eosinophilia and a right middle lobe mass. Chest CT  angiogram on 03/27/2016 revealed no evidence of pulmonary embolism. There was an 8 cm mass in the medial right upper lobe suspicious for bronchogenic neoplasm. The mass abutted the mediastinum and possibly invaded the left atrium via the pulmonary vein. There was associated widespread thoracic adenopathy. There was a 2.6 cm enhancing lesion in the medial segment of the left hepatic lobe (metastasis versus hemangioma). He has a 13-15 pack year smoking history.  He has marked leukocytosis with hypereosinophilia worrisome for a myeloproliferative neoplasm. Eosinophilia can be associated with lung cancer (large cell or adenocarcinoma) as well as lymphoma, but not typically to this degree. He has no skin, gastrointestinal, or rheumatologic conditions. He has no wheezing. He has shortness of breath likely due to his right chest mass.He has lost 30 pounds in the past 4 months. LDH and uric acid are normal.  He has an elevated troponin possibly secondary to tumor invasion of the left atrium or eosinophilic myocarditis. Echo on 03/28/2016 revealed a 2.6 cm x 0.7 cm mobile left atrial mass.  Ejection fraction was 50-5%.  He denies any chest pain.   He has unexplained left leg discomfort. Left lower extremity duplex on 03/25/2016 revealed no evidence of DVT. He may have a peripheral neuropathy secondary to his hypereosinophilia.  Plan: 1.  Oncology:  Patient underwent bronchoscopy today.  There was an endobronchial lesion in the RUL anterior segment with 95% occlusion.  Pathology is pending.  There was extrinsic compression in the right middle lobe secondary to posterior mass effect.    Await pathology.  Discussed with patient findings to date.  He has at least a T4N2 (stage IIIB) lung cancer.  Unclear if liver lesion represents metastatic disease.  Await outpatient PET scan.  Discuss tumor has grown up pulmonary vein and is in the left atrium.  Risk for tumor emboli and stroke.  Discuss likely plan  for concurrent chemotherapy and radiation.  Anticipate CNS imaging tomorrow (head MRI versus CT).  Patient in agreement with work-up.  He is unsure about treatment.  He is willing to discuss treatment plan once all studies have returned.  We discussed code status.  He wishes to remain full code at this time, but will be discussing further with his family.  2.  Hematology:  He has marked leukocytosis with hypereosinophilia worrisome for a myeloproliferative neoplasm. Bone marrow performed on 03/29/2016.  Multiple studies pending including myeloproliferative panel with hypereosinophilia.  SPEP reveals a polyclonal gammopathy.  IgG, IgA, and IgE elevated.  HIV and HTLVI/II negative.  Holding off on high dose steroids.  3.  Cardiology:  Left atrial mass.  Serial troponins decreased.  TEE scheduled for 03/31/2016.   Lequita Asal, MD  03/30/2016

## 2016-03-31 NOTE — Progress Notes (Signed)
Pulmonary Update - patient s/p bronch and stable from pulmonary standpoint.  - no acute respiratory distress - further management of lung mass per Heme/Onc - no need for inhalers at this time - pulmonary will follow along peripherally, we will see patient again on Monday, unless otherwise needed over the weekend.  Thank you for consulting [facility].  Please feel free to contact us with any questions at [phone] (please enter 7-digits).   Vilinda Boehringer, MD [facility] Pager [phone] (please enter 7-digits) On Call Pager - [phone] (please enter 7-digits)

## 2016-04-01 LAB — CBC WITH DIFFERENTIAL/PLATELET
Basophils Absolute: 0 10*3/uL (ref 0–0.1)
Basophils Relative: 0 %
Eosinophils Absolute: 24.7 10*3/uL — ABNORMAL HIGH (ref 0.0–0.7)
Eosinophils Relative: 60 %
HCT: 36.5 % — ABNORMAL LOW (ref 40.0–52.0)
Hemoglobin: 11.9 g/dL — ABNORMAL LOW (ref 13.0–18.0)
Lymphocytes Relative: 4 %
Lymphs Abs: 1.6 10*3/uL (ref 0.7–4.0)
MCH: 28.7 pg (ref 26.0–34.0)
MCHC: 32.7 g/dL (ref 32.0–36.0)
MCV: 87.9 fL (ref 80.0–100.0)
Monocytes Absolute: 0.8 10*3/uL (ref 0.1–1.0)
Monocytes Relative: 2 %
Neutro Abs: 14 10*3/uL — ABNORMAL HIGH (ref 1.7–7.7)
Neutrophils Relative %: 34 %
Platelets: 254 10*3/uL (ref 150–440)
RBC: 4.15 MIL/uL — ABNORMAL LOW (ref 4.40–5.90)
RDW: 15 % — ABNORMAL HIGH (ref 11.5–14.5)
WBC: 41.1 10*3/uL — ABNORMAL HIGH (ref 3.8–10.6)

## 2016-04-01 LAB — CULTURE, BAL-QUANTITATIVE
CULTURE: NO GROWTH
SPECIAL REQUESTS: NORMAL

## 2016-04-01 LAB — CULTURE, BAL-QUANTITATIVE W GRAM STAIN

## 2016-04-03 ENCOUNTER — Inpatient Hospital Stay (HOSPITAL_BASED_OUTPATIENT_CLINIC_OR_DEPARTMENT_OTHER): Payer: Medicare Other | Admitting: Hematology and Oncology

## 2016-04-03 ENCOUNTER — Ambulatory Visit
Admission: RE | Admit: 2016-04-03 | Discharge: 2016-04-03 | Disposition: A | Discharge status: Home / Self Care | Payer: Medicare Other | Source: Ambulatory Visit | Attending: Radiation Oncology | Admitting: Radiation Oncology

## 2016-04-03 VITALS — BP 105/66 | HR 84 | Temp 96.1°F | Resp 18 | Wt 133.3 lb

## 2016-04-03 DIAGNOSIS — I998 Other disorder of circulatory system: Secondary | ICD-10-CM | POA: Diagnosis not present

## 2016-04-03 DIAGNOSIS — G939 Disorder of brain, unspecified: Secondary | ICD-10-CM

## 2016-04-03 DIAGNOSIS — Z87891 Personal history of nicotine dependence: Secondary | ICD-10-CM | POA: Diagnosis not present

## 2016-04-03 DIAGNOSIS — D72829 Elevated white blood cell count, unspecified: Secondary | ICD-10-CM

## 2016-04-03 DIAGNOSIS — R05 Cough: Secondary | ICD-10-CM

## 2016-04-03 DIAGNOSIS — K769 Liver disease, unspecified: Secondary | ICD-10-CM | POA: Diagnosis not present

## 2016-04-03 DIAGNOSIS — M79605 Pain in left leg: Secondary | ICD-10-CM | POA: Diagnosis not present

## 2016-04-03 DIAGNOSIS — R599 Enlarged lymph nodes, unspecified: Secondary | ICD-10-CM

## 2016-04-03 DIAGNOSIS — Z801 Family history of malignant neoplasm of trachea, bronchus and lung: Secondary | ICD-10-CM | POA: Diagnosis not present

## 2016-04-03 DIAGNOSIS — Z79899 Other long term (current) drug therapy: Secondary | ICD-10-CM

## 2016-04-03 DIAGNOSIS — Z510 Encounter for antineoplastic radiation therapy: Secondary | ICD-10-CM | POA: Diagnosis not present

## 2016-04-03 DIAGNOSIS — M129 Arthropathy, unspecified: Secondary | ICD-10-CM

## 2016-04-03 DIAGNOSIS — R630 Anorexia: Secondary | ICD-10-CM

## 2016-04-03 DIAGNOSIS — C3411 Malignant neoplasm of upper lobe, right bronchus or lung: Secondary | ICD-10-CM

## 2016-04-03 DIAGNOSIS — G619 Inflammatory polyneuropathy, unspecified: Secondary | ICD-10-CM

## 2016-04-03 DIAGNOSIS — Z809 Family history of malignant neoplasm, unspecified: Secondary | ICD-10-CM

## 2016-04-03 DIAGNOSIS — I639 Cerebral infarction, unspecified: Secondary | ICD-10-CM

## 2016-04-03 DIAGNOSIS — R918 Other nonspecific abnormal finding of lung field: Secondary | ICD-10-CM

## 2016-04-03 DIAGNOSIS — R634 Abnormal weight loss: Secondary | ICD-10-CM

## 2016-04-03 DIAGNOSIS — D72822 Plasmacytosis: Secondary | ICD-10-CM | POA: Diagnosis not present

## 2016-04-03 DIAGNOSIS — R5383 Other fatigue: Secondary | ICD-10-CM

## 2016-04-03 DIAGNOSIS — R200 Anesthesia of skin: Secondary | ICD-10-CM | POA: Diagnosis not present

## 2016-04-03 DIAGNOSIS — C3491 Malignant neoplasm of unspecified part of right bronchus or lung: Secondary | ICD-10-CM | POA: Diagnosis not present

## 2016-04-03 DIAGNOSIS — E43 Unspecified severe protein-calorie malnutrition: Secondary | ICD-10-CM

## 2016-04-03 DIAGNOSIS — D721 Eosinophilia, unspecified: Secondary | ICD-10-CM

## 2016-04-03 DIAGNOSIS — C7951 Secondary malignant neoplasm of bone: Secondary | ICD-10-CM

## 2016-04-03 DIAGNOSIS — M879 Osteonecrosis, unspecified: Secondary | ICD-10-CM | POA: Diagnosis not present

## 2016-04-03 DIAGNOSIS — G629 Polyneuropathy, unspecified: Secondary | ICD-10-CM | POA: Insufficient documentation

## 2016-04-03 DIAGNOSIS — N289 Disorder of kidney and ureter, unspecified: Secondary | ICD-10-CM | POA: Diagnosis not present

## 2016-04-03 LAB — CYTOLOGY - NON PAP

## 2016-04-03 NOTE — Progress Notes (Signed)
[facility] day:  04/03/2016  Chief Complaint: Gerald [name] is a 65 y.o. male with lung cancer and hypereosinophilia who is seen for reassessment after interval hospitalization.  HPI:  The patient was last seen in the medical oncology clinic on 03/27/2016 for initial consultation of a right lung mass and leukocytosis.  A work-up was initiated.  As his troponin was elevated, he was referred to the emergency room.  Chest CT angiogram revealed no evidence of pulmonary embolism. There was an 8 cm mass in the medial right upper lobe suspicious for bronchogenic neoplasm. The mass abutted the mediastinum and possibly invaded the left atrium. There was associated widespread thoracic adenopathy. There was a 2.6 cm enhancing lesion in the medial segment of the left hepatic lobe (metastasis versus hemangioma).  He was admitted to [facility] from 03/27/2016 - 03/31/2016.  He underwent bronchoscopy on 03/30/2016.  There was an endobronchial lesion in the RUL anterior segment with 95% occlusion. There was extrinsic compression in the right middle lobe secondary to posterior mass effect.  Pathology revealed non-small cell lung cancer.  As part of his work-up, he underwent head MRI on 03/31/2106.  Findings were indeterminate for early metastatic disease to the brain.  There was a small focus of gyral enhancement in the anterior right frontal lobe likely is post ischemic, while a small posterior right cerebellar enhancing lesion was more suspicious for small brain metastasis. Recommendation was for re-evaluation on a repeat study in 1-2 months.  There were multiple small primarily subacute appearing infarcts in the bilateral MCA and left PICA territories.  There was no associated hemorrhage or mass effect.  There was osseous metastatic disease in the cervical spine. There was heterogeneous marrow signal in the skull.  He was seen by radiation oncology.  He is undergoing  simulation today.  He is scheduled to begin radiation on 04/11/2016.  He also underwent work-up for his hypereosinophilia.  Bone marrow aspirate and biopsy on 03/29/2016 revealed marked increase in marrow eosinophils (approximate 30%). There was no diagnostic morphologic evidence of a myeloproliferative or lymphoid neoplasm.  Marrow was normocellular for age (50%) with maturing trilineage hematopoiesis and no significant dyspoiesis or increase in blasts.  There was polytypic plasmacytosis (10-15%). There was mild patchy increase in reticulin. Storage iron was present.  Flow cytometry revealed significant increase in eosinophils (54%).  There was relative decreased myeloid cells with no significant immunophenotypic abnormalities or increase in blasts. There was no lymphoid abnormalities or evidence of clonality. FISH studies were negative for the myeloproliferative neoplasms with eosinophilia (PDGFRA, PDGFRB, FGFR1)  Symptomatically, he notes his left leg and left foot are not 100%. He continues to have some discomfort. He notes that the volume of hemoptysis has increased is since his bronchoscopy.     Past Medical History  Diagnosis Date  . Arthritis     Rheumatoid arthritis  . Pneumothorax     spontaneous  . Renal disorder     as a child  . Mass of lung   . Collagen vascular disease ([place])     RA    Past Surgical History  Procedure Laterality Date  . Ureter repair as a child    . Flexible bronchoscopy N/A 03/30/2016    Procedure: FLEXIBLE BRONCHOSCOPY;  Surgeon: Vilinda Boehringer, MD;  Location: [facility] ORS;  Service: Cardiopulmonary;  Laterality: N/A;    Family History  Problem Relation Age of Onset  . Brain cancer Mother   . Lung cancer  Maternal Uncle   . Cancer Sister     Metastatic cancer- unknown primary    Social History:  reports that he quit smoking about 15 years ago. His smoking use included Cigarettes. He has a 13 pack-year smoking history. He does not have any smokeless tobacco  history on file. He reports that he drinks alcohol. He reports that he does not use illicit drugs.  He lives in [place].  Contact number is [phone].  The patient is accompanied by his daughter, Selena Batten, and her boyfriend today.  Allergies:  Allergies  Allergen Reactions  . Nsaids Other (See Comments)    Pt is unable to take this class of medications.    . Sulfa Antibiotics Other (See Comments)    Reaction:  GI upset     Current Medications: Current Outpatient Prescriptions  Medication Sig Dispense Refill  . atorvastatin (LIPITOR) 40 MG tablet Take 1 tablet (40 mg total) by mouth daily. 30 tablet 0  . gabapentin (NEURONTIN) 300 MG capsule Take 1 capsule (300 mg total) by mouth at bedtime. 30 capsule 0   No current facility-administered medications for this visit.    Review of Systems:  GENERAL:  Fatigue.  No fevers or sweats.  Weight loss of 30 pounds since 12/2015. PERFORMANCE STATUS (ECOG):  1 HEENT:  No diplopia.  No sore throat, mouth sores or tenderness. Lungs:  Shortness of breath.  Cough.  Increased hemoptysis since bronchoscopy. Cardiac:  No chest pain, palpitations, orthopnea, or PND.  Sleeps in a recliner. GI:  Poor appetite.  No nausea, vomiting, diarrhea, constipation, melena or hematochezia. GU:  No urgency, frequency, dysuria, or hematuria. Musculoskeletal:  No back pain.  No joint pain.  No muscle tenderness. Extremities:  No pain or swelling. Skin:  No rashes or skin changes. Neuro:  Rare headache.  Little numbness bottom of left foot extends upward slightly (sock distribution). No weakness, balance or coordination issues. Endocrine:  No diabetes, thyroid issues, hot flashes or night sweats. Psych:  No mood changes, depression or anxiety. Pain:  Discomfort in left foot. Review of systems:  All other systems reviewed and found to be negative.  Physical Exam: Blood pressure 105/66, pulse 84, temperature 96.1 F (35.6 C), temperature source Tympanic, resp.  rate 18, weight 133 lb 4.3 oz (60.45 kg). GENERAL:  Thin gentleman sitting comfortably in the exam room in no acute distress. MENTAL STATUS:  Alert and oriented to person, place and time. HEAD:  Long gray hair in pony tail.  Goatee.  Temporal wasting.  Normocephalic, atraumatic, face symmetric, no Cushingoid features. EYES:  Blue eyes.  Pupils equal round and reactive to light and accomodation.  No conjunctivitis or scleral icterus. ENT:  Oropharynx clear without lesion.  Edentulous except 1 tooth.  Tongue normal. Mucous membranes moist.  RESPIRATORY:  Clear to auscultation without rales, wheezes or rhonchi. CARDIOVASCULAR:  Intermittent irregular rate and rhythm without murmur, rub or gallop. ABDOMEN:  Soft, non-tender, with active bowel sounds, and no splenomegaly.  Left upper quadrant full.  No masses. SKIN:  No urticaria pigmentosa.  Negative Darier's sign.  No rashes, ulcers or lesions. EXTREMITIES: No edema, no skin discoloration or tenderness.  No palpable cords. LYMPH NODES: No palpable cervical, supraclavicular, axillary or inguinal adenopathy  NEUROLOGICAL: Appropriate. PSYCH:  Appropriate.   No visits with results within 3 Day(s) from this visit. Latest known visit with results is:  Admission on 03/27/2016, Discharged on 03/31/2016  Component Date Value Ref Range Status  . Troponin I 03/27/2016  0.07* <0.031 ng/mL Final   Comment: PREVIOUS RESULT CALLED TO BRANDY MOYA 03/27/16 AT 1434 BY [place]. KLK        PERSISTENTLY INCREASED TROPONIN VALUES IN THE RANGE OF 0.04-0.49 ng/mL CAN BE SEEN IN:       -UNSTABLE ANGINA       -CONGESTIVE HEART FAILURE       -MYOCARDITIS       -CHEST TRAUMA       -ARRYHTHMIAS       -LATE PRESENTING MYOCARDIAL INFARCTION       -COPD   CLINICAL FOLLOW-UP RECOMMENDED.   . Weight 03/28/2016 2097.6   Final-Edited  . Height 03/28/2016 71   Final-Edited  . BP 03/28/2016 105/67   Final-Edited  . Color, Urine 03/27/2016 YELLOW* YELLOW Final  .  APPearance 03/27/2016 CLEAR* CLEAR Final  . Glucose, UA 03/27/2016 NEGATIVE  NEGATIVE mg/dL Final  . Bilirubin Urine 03/27/2016 NEGATIVE  NEGATIVE Final  . Ketones, ur 03/27/2016 NEGATIVE  NEGATIVE mg/dL Final  . Specific Gravity, Urine 03/27/2016 1.020  1.005 - 1.030 Final  . Hgb urine dipstick 03/27/2016 NEGATIVE  NEGATIVE Final  . pH 03/27/2016 5.0  5.0 - 8.0 Final  . Protein, ur 03/27/2016 NEGATIVE  NEGATIVE mg/dL Final  . Nitrite 03/27/2016 NEGATIVE  NEGATIVE Final  . Leukocytes, UA 03/27/2016 NEGATIVE  NEGATIVE Final  . RBC / HPF 03/27/2016 NONE SEEN  0 - 5 RBC/hpf Final  . WBC, UA 03/27/2016 0-5  0 - 5 WBC/hpf Final  . Bacteria, UA 03/27/2016 NONE SEEN  NONE SEEN Final  . Squamous Epithelial / LPF 03/27/2016 0-5* NONE SEEN Final  . Mucous 03/27/2016 PRESENT   Final  . Troponin I 03/27/2016 0.07* <0.031 ng/mL Final   Comment: PREVIOUS RESULT CALLED TO BRANDY MOYA 03/27/16 AT 1434 BY [facility]/KLK.        PERSISTENTLY INCREASED TROPONIN VALUES IN THE RANGE OF 0.04-0.49 ng/mL CAN BE SEEN IN:       -UNSTABLE ANGINA       -CONGESTIVE HEART FAILURE       -MYOCARDITIS       -CHEST TRAUMA       -ARRYHTHMIAS       -LATE PRESENTING MYOCARDIAL INFARCTION       -COPD   CLINICAL FOLLOW-UP RECOMMENDED.   Marland Kitchen Troponin I 03/28/2016 0.06* <0.031 ng/mL Final   Comment: PREVIOUS RESULT CALLED TO BRANDY MOYA ON 03/27/16 AT 1434 BY [facility]B        PERSISTENTLY INCREASED TROPONIN VALUES IN THE RANGE OF 0.04-0.49 ng/mL CAN BE SEEN IN:       -UNSTABLE ANGINA       -CONGESTIVE HEART FAILURE       -MYOCARDITIS       -CHEST TRAUMA       -ARRYHTHMIAS       -LATE PRESENTING MYOCARDIAL INFARCTION       -COPD   CLINICAL FOLLOW-UP RECOMMENDED.   Marland Kitchen Troponin I 03/28/2016 0.08* <0.031 ng/mL Final   Comment: PREVIOUS RESULT CALLED TO BRANDY MOYA ON 03/27/16 AT 1434 BY [facility]        PERSISTENTLY INCREASED TROPONIN VALUES IN THE RANGE OF 0.04-0.49 ng/mL CAN BE SEEN IN:       -UNSTABLE ANGINA        -CONGESTIVE HEART FAILURE       -MYOCARDITIS       -CHEST TRAUMA       -ARRYHTHMIAS       -LATE PRESENTING MYOCARDIAL INFARCTION       -  COPD   CLINICAL FOLLOW-UP RECOMMENDED.   . TSH 03/27/2016 1.849  0.350 - 4.500 uIU/mL Final  . Magnesium 03/28/2016 1.8  1.7 - 2.4 mg/dL Final  . Sodium 03/28/2016 134* 135 - 145 mmol/L Final  . Potassium 03/28/2016 4.0  3.5 - 5.1 mmol/L Final  . Chloride 03/28/2016 106  101 - 111 mmol/L Final  . CO2 03/28/2016 24  22 - 32 mmol/L Final  . Glucose, Bld 03/28/2016 86  65 - 99 mg/dL Final  . BUN 03/28/2016 9  6 - 20 mg/dL Final  . Creatinine, Ser 03/28/2016 0.95  0.61 - 1.24 mg/dL Final  . Calcium 03/28/2016 8.2* 8.9 - 10.3 mg/dL Final  . GFR calc non Af Amer 03/28/2016 >60  >60 mL/min Final  . GFR calc Af Amer 03/28/2016 >60  >60 mL/min Final   Comment: (NOTE) The eGFR has been calculated using the CKD EPI equation. This calculation has not been validated in all clinical situations. eGFR's persistently <60 mL/min signify possible Chronic Kidney Disease.   . Anion gap 03/28/2016 4* 5 - 15 Final  . Specimen Type 03/28/2016 BLOOD   Final  . Cells Counted 03/28/2016 200   Final  . Cells Analyzed 03/28/2016 200   Final  . FISH Result 03/28/2016 Comment:   Final   NORMAL:  NO BCR OR ABL GENE REARRANGEMENT OBSERVED  . Interpretation 03/28/2016 Comment:   Final   Comment: (NOTE) .nuc ish 9q34(ASS1,ABL1)x2,22q11.2(BCRx2)[200].    The fluorescence in situ hybridization (FISH) study was negative. FISH, using unique sequence DNA probes for the ABL1 and BCR gene regions showed two ABL1 signals (red), two control ASS1  gene signals (aqua) located adjacent to the ABL1 locus at 9q34, and two BCR signals (green) at 22q11.2 in all interphase nuclei examined. There was NO evidence of CML or ALL-associated BCR/ABL1 dual fusion signals in this analysis.    This analysis is limited to abnormalities detectable by the specific probes included in the study. FISH  results should be interpreted within the context of a full cytogenetic analysis and hematologic evaluation.    This test was developed and its performance characteristics determined by [employer]). It has not been cleared or approved by the U.S. Food and Drug Administration. The DNA probe vendor for this study was [employer] (physical sciences)).   . Director Review: 03/28/2016 Comment:   Final   Comment: (NOTE) Wylie Hail, PhD, [facility] Performed At: [facility] [address] [place], [place] [id number] Nechama Guard MD ST:[id number]   . WBC 03/28/2016 71.1* 3.8 - 10.6 K/uL Final   Comment: RESULT REPEATED AND VERIFIED WHITE COUNT CONFIRMED ON SMEAR CRITICAL RESULT CALLED TO, READ BACK BY AND VERIFIED WITH: ADRIENNE WHITE ON 03/28/16 AT 0600 BY [facility]   . RBC 03/28/2016 4.16* 4.40 - 5.90 MIL/uL Final  . Hemoglobin 03/28/2016 11.9* 13.0 - 18.0 g/dL Final  . HCT 03/28/2016 37.0* 40.0 - 52.0 % Final  . MCV 03/28/2016 88.9  80.0 - 100.0 fL Final  . MCH 03/28/2016 28.6  26.0 - 34.0 pg Final  . MCHC 03/28/2016 32.1  32.0 - 36.0 g/dL Final  . RDW 03/28/2016 15.1* 11.5 - 14.5 % Final  . Platelets 03/28/2016 232  150 - 440 K/uL Final  . Neutrophils Relative % 03/28/2016 39   Final  . Lymphocytes Relative 03/28/2016 5   Final  . Monocytes Relative 03/28/2016 4   Final  . Eosinophils Relative 03/28/2016 51   Final  .  Basophils Relative 03/28/2016 1   Final  . Neutro Abs 03/28/2016 27.7* 1.4 - 6.5 K/uL Final  . Lymphs Abs 03/28/2016 3.6  1.0 - 3.6 K/uL Final  . Monocytes Absolute 03/28/2016 2.8* 0.2 - 1.0 K/uL Final  . Eosinophils Absolute 03/28/2016 36.3* 0 - 0.7 K/uL Final  . Basophils Absolute 03/28/2016 0.7* 0 - 0.1 K/uL Final  . Smear Review 03/28/2016 LARGE PLATELETS PRESENT   Final  . Prothrombin Time 03/28/2016 16.0* 11.4 - 15.0 seconds Final  . INR 03/28/2016 1.27   Final  . aPTT 03/28/2016 38* 24 - 36 seconds Final    Comment:        IF BASELINE aPTT IS ELEVATED, SUGGEST PATIENT RISK ASSESSMENT BE USED TO DETERMINE APPROPRIATE ANTICOAGULANT THERAPY.   . [facility] test code 03/28/2016 256389   Final  . [facility] test name 03/28/2016 Myeloproliferative Neoplasms With Hypereosinophilia (MPN/HES), FISH   Final  . Misc [facility] result 03/28/2016 COMMENT   Final   Comment: (NOTE) Test Ordered: [id number] MPN w/Hypereosinophilia FISH Specimen Type                  BLOOD                     YU   Cells Counted                  100                       YU   Cells Analyzed                 100                       YU   FISH Result                    Comment:                  YU   NO DELETION OF CHIC2/LNX OR REARRANGEMENT OF PDGFRA, PDGFRB OR FGFR1 OBSERVED Interpretation                 Comment:                  YU      The MPN with hypereosinophilia fluorescence panel in situ hybridization (FISH) analysis was normal. No deletion of LMX/CHIC2 or rearrangement of PDGFRA, PDGFRB or FGFR1 was observed.  SPECIFIC PROBE RESULTS:  PDGFRA: NORMAL    nuc ish 4q12(SCFD2/FIP1L1,LNX/CHIC2,PDGFRA)x2[100]  PDGFRB: NORMAL    nuc ish 5q33(PDGFRBx2)[100]  FGFR1: NORMAL    nuc ish 8p12(FGFR1x2)[100]    This analysis is limited to abnormalities detectable by the specific probes included in the study. FISH results should be interpreted within the context of a                           full cytogenetic analysis and hematologic evaluation. .This test was developed and its performance characteristics determined by [employer]). It has not been cleared or approved by the U.S. Food and Drug Administration. The DNA probe vendors for this study were [employer] and [employer] (physical sciences)). Director Review:               Comment:                  YU  Laurie Panda, PHD Performed At: [facility] [address] [place], [place] [id number] Lindon Romp MD  WI:[id number] Performed At: [facility] [address] [place], [place] [id number] Nechama Guard MD QI:[id number]   . aPTT 03/29/2016 38* 24 - 36 seconds Final   Comment:        IF BASELINE aPTT IS ELEVATED, SUGGEST PATIENT RISK ASSESSMENT BE USED TO DETERMINE APPROPRIATE ANTICOAGULANT THERAPY.   . IgE (Immunoglobulin E), Serum 03/29/2016 247* 0 - 100 IU/mL Final   Comment: (NOTE) Performed At: [facility] [place], [place] [id number] Lindon Romp MD YJ:[id number]   . WBC 03/29/2016 69.6* 3.8 - 10.6 K/uL Final   CRITICAL VALUE NOTED.  VALUE IS CONSISTENT WITH PREVIOUSLY REPORTED AND CALLED VALUE.  Marland Kitchen RBC 03/29/2016 4.11* 4.40 - 5.90 MIL/uL Final  . Hemoglobin 03/29/2016 11.9* 13.0 - 18.0 g/dL Final  . HCT 03/29/2016 35.9* 40.0 - 52.0 % Final  . MCV 03/29/2016 87.4  80.0 - 100.0 fL Final  . MCH 03/29/2016 29.1  26.0 - 34.0 pg Final  . MCHC 03/29/2016 33.3  32.0 - 36.0 g/dL Final  . RDW 03/29/2016 14.8* 11.5 - 14.5 % Final  . Platelets 03/29/2016 258  150 - 440 K/uL Final  . Neutrophils Relative % 03/29/2016 38   Final  . Lymphocytes Relative 03/29/2016 5   Final  . Monocytes Relative 03/29/2016 4   Final  . Eosinophils Relative 03/29/2016 52   Final  . Basophils Relative 03/29/2016 1   Final  . Neutro Abs 03/29/2016 26.4* 1.4 - 6.5 K/uL Final  . Lymphs Abs 03/29/2016 3.5  1.0 - 3.6 K/uL Final  . Monocytes Absolute 03/29/2016 2.8* 0.2 - 1.0 K/uL Final  . Eosinophils Absolute 03/29/2016 36.2* 0 - 0.7 K/uL Final  . Basophils Absolute 03/29/2016 0.7* 0 - 0.1 K/uL Final  . Smear Review 03/29/2016 MORPHOLOGY UNREMARKABLE   Final  . [facility] test code 03/29/2016 16400   Final  . [facility] test name 03/29/2016 STRONGLOIDES   Final  . Misc [facility] result 03/29/2016 COMMENT   Final   Comment: (NOTE) Test Ordered: 164000 Strongyloides IgG Antibody Strongyloides IgG Antibody     Negative                  BN     Reference Range: Negative                               Performed At: [facility] [place], [place] [id number] Lindon Romp MD YD:[id number]   . WBC 03/30/2016 70.1* 3.8 - 10.6 K/uL Final   Comment: RESULT REPEATED AND VERIFIED CRITICAL RESULT CALLED TO, READ BACK BY AND VERIFIED WITH: TIFFANY MATTOCKS @ 0536 ON 03/30/2016 BY [facility]   . RBC 03/30/2016 4.19* 4.40 - 5.90 MIL/uL Final  . Hemoglobin 03/30/2016 11.9* 13.0 - 18.0 g/dL Final  . HCT 03/30/2016 36.9* 40.0 - 52.0 % Final  . MCV 03/30/2016 88.1  80.0 - 100.0 fL Final  . MCH 03/30/2016 28.6  26.0 - 34.0 pg Final  . MCHC 03/30/2016 32.4  32.0 - 36.0 g/dL Final  . RDW 03/30/2016 15.2* 11.5 - 14.5 % Final  . Platelets 03/30/2016 258  150 - 440 K/uL Final  . Neutrophils Relative % 03/30/2016 37.000000   Final  . Lymphocytes Relative 03/30/2016 5.000000   Final  . Monocytes Relative 03/30/2016 5.000000   Final  .  Eosinophils Relative 03/30/2016 53.000000   Final  . Basophils Relative 03/30/2016 0.000000   Final  . Neutro Abs 03/30/2016 25.9* 1.4 - 6.5 K/uL Final  . Lymphs Abs 03/30/2016 3.5  1.0 - 3.6 K/uL Final  . Monocytes Absolute 03/30/2016 3.5* 0.2 - 1.0 K/uL Final  . Eosinophils Absolute 03/30/2016 37.2* 0 - 0.7 K/uL Final  . Basophils Absolute 03/30/2016 0.0  0 - 0.1 K/uL Final  . Smear Review 03/30/2016 MORPHOLOGY UNREMARKABLE   Final  . Specimen Description 03/30/2016 BRONCHIAL WASHINGS   Final  . Special Requests 03/30/2016 Normal   Final  . Gram Stain 03/30/2016    Final                   Value:MANY RED BLOOD CELLS MODERATE WBC SEEN NO ORGANISMS SEEN   . Culture 03/30/2016 NO GROWTH 2 DAYS   Final  . Report Status 03/30/2016 04/01/2016 FINAL   Final  . CYTOLOGY - NON GYN 03/30/2016    Final                   Value:Cytology - Non PAP CASE: [id number] PATIENT: Euel Mcilvaine Non-Gyn Cytology Report     SPECIMEN SUBMITTED: A. Lung, right upper lobe, bronchial lavage  CLINICAL HISTORY: 65 YO former smoker with RUL  mass  PRE-OPERATIVE DIAGNOSIS: Malignancy  POST-OPERATIVE DIAGNOSIS: None provided.     DIAGNOSIS: A. LUNG, RIGHT UPPER LOBE; BRONCHIAL LAVAGE: - POSITIVE FOR MALIGNANCY. - RARE CELLS CONSISTENT WITH NON-SMALL CELL CARCINOMA.  Comment: Please see separate report on the endobronchial biopsy ([id number]). The immunohistochemistry results on the biopsy support a diagnosis of non-small cell carcinoma, favor adenocarcinoma (positive TTF-1 and Napsin A, and negative p40).   GROSS DESCRIPTION: A. Site: right upper lobe Procedure: bronchoscopy Cytotechnologist: Krystal Eaton and Georgina Snell Specimen(s) collected:  Specimen labeled wash :      Description: pink CytoLyt solution with a few fragments      Submitted for:           ThinPrep  A forceps bio                         psy was obtained and will be reported in a separate ARS report. Final Diagnosis performed by Bryan Lemma, MD.  Electronically signed 04/03/2016 1:55:28PM    The electronic signature indicates that the named Attending Pathologist has evaluated the specimen  Technical component performed at [facility], [address], [place], [place] [address] Lab: [phone] Dir: Darrick Penna. Evette Doffing, MD  Professional component performed at [facility], [facility], [place], [place], [place] [address] Lab: [phone] Dir: Dellia Nims. Reuel Derby, MD    . SURGICAL PATHOLOGY 03/30/2016    Final                   Value:Surgical Pathology CASE: [id number] PATIENT: Kalin Wirtz Surgical Pathology Report     SPECIMEN SUBMITTED: A. Lung, right upper lobe, endobronchial forceps biopsy  CLINICAL HISTORY: 65 YO M former smoker with RUL mass  PRE-OPERATIVE DIAGNOSIS: Malignancy  POST-OPERATIVE DIAGNOSIS: None provided.     DIAGNOSIS: A. LUNG, RIGHT UPPER LOBE; ENDOBRONCHIAL FORCEPS BIOPSY: - NON-SMALL CELL CARCINOMA, FAVOR ADENOCARCINOMA OF LUNG ORIGIN.  Comment: A poorly differentiated  malignant neoplasm is present, with solid growth pattern, large cells, marked nuclear pleomorphism, and necrosis. No glandular or squamous differentiation is seen on routine stains or by touch imprint cytology, so immunohistochemistry (IHC) was performed for further characterization. The neoplastic  cells show diffuse strong staining for cytokeratin (AE1/AE3/Cam5.2) and TTF-1, and there is focal staining for Napsin A. The cells are negative for p40. The IHC profile supports the above diagnos                         is.  There is adequate tumor tissue for ancillary testing.  IHC slides were prepared by [facility], [facility], [place], and interpreted by Dr. Dicie Beam. All controls stained appropriately.   GROSS DESCRIPTION: A. Labeled: patient's name and medical record number biopsy Tissue fragment(s): multiple Size: aggregate, 1.0 x 0.1 x 0.1 cm Description: Tan to red fragments, wrapped in lens paper marked orange and submitted in a mesh bag, 1 Diff Quik stained touch imprint also submitted.  Entirely submitted in 2 cassette(s).  Final Diagnosis performed by Bryan Lemma, MD.  Electronically signed 04/03/2016 4:06:06PM    The electronic signature indicates that the named Attending Pathologist has evaluated the specimen  Technical component performed at [facility], [address], [place], [place] [address] Lab: [phone] Dir: Darrick Penna. Evette Doffing, MD  Professional component performed at [facility], [facility], [address]                         , [place], [place] [address] Lab: [phone] Dir: Dellia Nims. Rubinas, MD    . CYTOLOGY - NON GYN 03/30/2016    Final                   Value:Cytology - Non PAP CASE: [id number] PATIENT: Naftuli Lindaman Non-Gyn Cytology Report     SPECIMEN SUBMITTED: A. Lung, right upper lobe, FNA  CLINICAL HISTORY: 65 YO former smoker with RUL mass  PRE-OPERATIVE  DIAGNOSIS: Malignancy  POST-OPERATIVE DIAGNOSIS: None provided.     DIAGNOSIS: A. LUNG, RIGHT UPPER LOBE; BRONCHOSCOPIC TRANSBRONCHIAL FNA: - POSITIVE FOR MALIGNANCY. - NON-SMALL CELL CARCINOMA.  Comment: Please see separate report on the endobronchial forceps biopsy ([id number]). Immunohistochemistry on the biopsy supports a diagnosis of non-small cell carcinoma, favor adenocarcinoma (positive TTF-1 and Napsin A, and negative p40).  Slides reviewed: 3 Diff-Quik stained slides, 3 Pap stained slides, and 1 ThinPrep.   GROSS DESCRIPTION: A. Site: right upper lobe Procedure: bronchoscopy Cytotechnologist: Krystal Eaton and Ashlee Howze Specimen(s) collected: 3 Diff Quik stained slides 3 Pap stained slides Specimen labeled needle :      De                         scription: clear CytoLyt solution      Submitted for:           ThinPrep  A forceps biopsy was obtained and will be reported in a separate ARS report.  Final Diagnosis performed by Bryan Lemma, MD.  Electronically signed 04/03/2016 1:59:54PM    The electronic signature indicates that the named Attending Pathologist has evaluated the specimen  Technical component performed at [facility], [address], [place], [place] [address] Lab: [phone] Dir: Darrick Penna. Evette Doffing, MD  Professional component performed at [facility], [facility], [place], [place], [place] [address] Lab: [phone] Dir: Dellia Nims. Rubinas, MD    . WBC 03/31/2016 41.1* 3.8 - 10.6 K/uL Final  . RBC 03/31/2016 4.15* 4.40 - 5.90 MIL/uL Final  . Hemoglobin 03/31/2016 11.9* 13.0 - 18.0 g/dL Final  . HCT 03/31/2016 36.5* 40.0 - 52.0 % Final  . MCV  03/31/2016 87.9  80.0 - 100.0 fL Final  . MCH 03/31/2016 28.7  26.0 - 34.0 pg Final  . MCHC 03/31/2016 32.7  32.0 - 36.0 g/dL Final  . RDW 03/31/2016 15.0* 11.5 - 14.5 % Final  . Platelets 03/31/2016 254  150 - 440 K/uL Final  . Neutrophils Relative % 03/31/2016 34    Corrected   Comment: NOTIFIED DR CHEN OF CORRECTION @ 1110 03/31/16 [place] CORRECTED ON 04/21 AT 1114: PREVIOUSLY REPORTED AS 81   . Lymphocytes Relative 03/31/2016 4   Corrected   Comment: NOTIFIED DR CHEN OF CORRECTION @ 1110 03/31/16 [place] CORRECTED ON 04/21 AT 1114: PREVIOUSLY REPORTED AS 5   . Monocytes Relative 03/31/2016 2   Corrected   Comment: NOTIFIED DR CHEN OF CORRECTION @ 1110 03/31/16 [place] CORRECTED ON 04/21 AT 1114: PREVIOUSLY REPORTED AS 6   . Eosinophils Relative 03/31/2016 60   Corrected   Comment: NOTIFIED DR CHEN OF CORRECTION @ 1110 03/31/16 [place] CORRECTED ON 04/21 AT 1114: PREVIOUSLY REPORTED AS 8   . Basophils Relative 03/31/2016 0   Final  . Neutro Abs 03/31/2016 14.0* 1.7 - 7.7 K/uL Corrected   Comment: ABSOLUTE VALUES UPDATED ACCORDING TO DIFFERENTIAL PERCENTAGE CORRECTION CORRECTED ON 04/22 AT 1226: PREVIOUSLY REPORTED AS 33.2   . Lymphs Abs 03/31/2016 1.6  0.7 - 4.0 K/uL Corrected   CORRECTED ON 04/22 AT 1226: PREVIOUSLY REPORTED AS 2.1  . Monocytes Absolute 03/31/2016 0.8  0.1 - 1.0 K/uL Corrected   CORRECTED ON 04/22 AT 1226: PREVIOUSLY REPORTED AS 2.5  . Eosinophils Absolute 03/31/2016 24.7* 0.0 - 0.7 K/uL Corrected   CORRECTED ON 04/22 AT 1226: PREVIOUSLY REPORTED AS 3.3  . Basophils Absolute 03/31/2016 0.0  0 - 0.1 K/uL Final  . RBC Morphology 03/31/2016 MIXED RBC POPULATION   Final    Assessment:  Dryden Tapley is a 65 y.o. male with metastatic non-small cell lung cancer and associated leukocytosis with eosinophilia.  He presented with a 30 pound weight loss, shortness of breath, cough, and left leg discomfort.  He has a 13-15 pack year smoking history.  Chest CT angiogram on 03/27/2016 revealed an 8 cm mass in the medial right upper lobe. The mass abutted the mediastinum and possibly invaded the left atrium. There was associated widespread thoracic adenopathy. There was a 2.6 cm enhancing lesion in the medial segment of the left hepatic lobe (metastasis  versus hemangioma).  Bronchoscopy on 03/30/2016 revealed an endobronchial lesion in the RUL anterior segment with 95% occlusion. There was extrinsic compression in the right middle lobe secondary to posterior mass effect.  Pathology confirmed non-small cell lung cancer.  Head MRI on 03/31/2106 was indeterminate for early metastatic disease to the brain.  There was a small focus of gyral enhancement in the anterior right frontal lobe likely is post ischemic, while a small posterior right cerebellar enhancing lesion was more suspicious for small brain metastasis.  There were multiple small primarily subacute appearing infarcts in the bilateral MCA and left PICA territories.  There was no associated hemorrhage or mass effect.  There was osseous metastatic disease in the cervical spine.   He has marked leukocytosis with hypereosinophilia.  Initial WBC was 72,300 with 53% eosinophils.  Bone marrow on 03/29/2016 revealed marked increase in marrow eosinophils (approximate 30%). There was no diagnostic morphologic evidence of a myeloproliferative or lymphoid neoplasm.  Flow cytometry revealed significant increase in eosinophils (54%).  There was relative decreased myeloid cells with no significant immunophenotypic abnormalities or increase in blasts.  There was no lymphoid abnormalities or evidence of clonality. FISH studies were negative for myeloproliferative neoplasms with eosinophilia (PDGFRA, PDGFRB, FGFR1).  BCR-ABL was negative on 03/28/2016.  He has unexplained left leg discomfort. Left lower extremity duplex on 03/25/2016 revealed no evidence of DVT.  He may have a peripheral neuropathy secondary to his hypereosinophilia.  Symptomatically, he is fatigued.  He has had more hemoptysis since his bronchoscopy.  Exam is stable.  Plan: 1.  Review results of bronchoscopy.  Pathology reveals non-small cell lung cancer. Current imaging imaging studies were reviewed. Head MRI suggests cervical spine metastasis.   Liver lesion is suspicious for metastasis but could represent a hemangioma. Discuss PET scan and bone scan.  Discuss  plan for concurrent chemotherapy and radiation given locally advanced disease involving the heart.  He will receive weekly carboplatin and Taxol with radiation.   Side effects of chemotherapy were reviewed including hair loss, myelosuppression, nausea, vomiting, electrolytes wasting (potassium and magnesium), allergic reaction, and peripheral neuropathy. I discussed the concern for the mass in the left atrium and a possible ongoing embolic issues (stroke). Current head MR discussed with the patient. He will need a follow-up head MRI in 1 to 2 months to reassess if there is any evidence of metastatic disease given the other equivocal findings.  2. Discuss bone marrow spirate and biopsy.  Discuss available study results. Currently there is no evidence of a myeloproliferative disorder with hypereosinophilia. Findings likely represent a reaction to his non-small cell lung cancer. His eosinophilia may be causing his peripheral neuropathy.  No intervention is necessary except for treatment of his lung cancer.  Discuss consideration of Neurontin for his neuropathy.  Side effects reviewed.  3.  Discuss code status.  Discuss treatment for metastatic lung cancer is palliative.  Given disease invading heart, recommend DNR/DNI code status.  Discuss potential CVA.  Patient unable to make a decision today.  He wishes to discuss with his family.    4.  Schedule chemotherapy class. 5.  Preauth weekly carboplatin and Taxol with radiation. 6.  Schedule PET scan and bone scan. 7.  Await tumor mutational analysis (EGFR, ALK, ROS1, PD-L1) testing. 8.  RTC on 04/07/2016 for review of imaging and finalization of treatment plan.   Lequita Asal, MD  04/03/2016, 9:09 AM

## 2016-04-03 NOTE — Progress Notes (Signed)
States is having "nagging" pain in left leg that started about 1 week ago. Pain is not severe but is noticeable. Coughing up some blood in sputum that was first noticed after bronchoscopy.

## 2016-04-04 ENCOUNTER — Inpatient Hospital Stay: Payer: Medicare Other

## 2016-04-04 NOTE — Patient Instructions (Signed)

## 2016-04-05 ENCOUNTER — Ambulatory Visit
Admission: RE | Admit: 2016-04-05 | Discharge: 2016-04-05 | Disposition: A | Discharge status: Home / Self Care | Payer: Medicare Other | Source: Ambulatory Visit | Attending: Hematology and Oncology | Admitting: Hematology and Oncology

## 2016-04-05 DIAGNOSIS — C3411 Malignant neoplasm of upper lobe, right bronchus or lung: Secondary | ICD-10-CM | POA: Insufficient documentation

## 2016-04-05 DIAGNOSIS — C3492 Malignant neoplasm of unspecified part of left bronchus or lung: Secondary | ICD-10-CM | POA: Diagnosis not present

## 2016-04-05 DIAGNOSIS — M899 Disorder of bone, unspecified: Secondary | ICD-10-CM | POA: Insufficient documentation

## 2016-04-05 DIAGNOSIS — R590 Localized enlarged lymph nodes: Secondary | ICD-10-CM | POA: Diagnosis not present

## 2016-04-05 DIAGNOSIS — C7951 Secondary malignant neoplasm of bone: Secondary | ICD-10-CM | POA: Diagnosis not present

## 2016-04-05 DIAGNOSIS — C787 Secondary malignant neoplasm of liver and intrahepatic bile duct: Secondary | ICD-10-CM | POA: Insufficient documentation

## 2016-04-05 LAB — GLUCOSE, CAPILLARY: Glucose-Capillary: 82 mg/dL (ref 65–99)

## 2016-04-05 MED ORDER — FLUDEOXYGLUCOSE F - 18 (FDG) INJECTION
12.2000 | Freq: Once | INTRAVENOUS | Status: AC | PRN
  Administered 2016-04-05: 12.2 via INTRAVENOUS

## 2016-04-06 DIAGNOSIS — C3491 Malignant neoplasm of unspecified part of right bronchus or lung: Secondary | ICD-10-CM | POA: Diagnosis not present

## 2016-04-06 DIAGNOSIS — Z510 Encounter for antineoplastic radiation therapy: Secondary | ICD-10-CM | POA: Diagnosis not present

## 2016-04-06 DIAGNOSIS — C3411 Malignant neoplasm of upper lobe, right bronchus or lung: Secondary | ICD-10-CM | POA: Diagnosis not present

## 2016-04-06 LAB — BCR-ABL1 FISH
Cells Analyzed: 200
Cells Counted: 200

## 2016-04-07 ENCOUNTER — Encounter: Payer: Self-pay | Admitting: Hematology and Oncology

## 2016-04-07 ENCOUNTER — Ambulatory Visit
Admission: RE | Admit: 2016-04-07 | Discharge: 2016-04-07 | Disposition: A | Discharge status: Home / Self Care | Payer: Medicare Other | Source: Ambulatory Visit | Attending: Hematology and Oncology | Admitting: Hematology and Oncology

## 2016-04-07 ENCOUNTER — Inpatient Hospital Stay (HOSPITAL_BASED_OUTPATIENT_CLINIC_OR_DEPARTMENT_OTHER): Payer: Medicare Other | Admitting: Hematology and Oncology

## 2016-04-07 ENCOUNTER — Encounter: Admission: RE | Admit: 2016-04-07 | Payer: Medicare Other | Source: Ambulatory Visit

## 2016-04-07 VITALS — BP 113/76 | HR 88 | Temp 98.0°F | Wt 132.1 lb

## 2016-04-07 DIAGNOSIS — I998 Other disorder of circulatory system: Secondary | ICD-10-CM

## 2016-04-07 DIAGNOSIS — C7951 Secondary malignant neoplasm of bone: Secondary | ICD-10-CM

## 2016-04-07 DIAGNOSIS — M879 Osteonecrosis, unspecified: Secondary | ICD-10-CM

## 2016-04-07 DIAGNOSIS — R630 Anorexia: Secondary | ICD-10-CM

## 2016-04-07 DIAGNOSIS — R590 Localized enlarged lymph nodes: Secondary | ICD-10-CM | POA: Diagnosis not present

## 2016-04-07 DIAGNOSIS — Z87891 Personal history of nicotine dependence: Secondary | ICD-10-CM

## 2016-04-07 DIAGNOSIS — C3411 Malignant neoplasm of upper lobe, right bronchus or lung: Secondary | ICD-10-CM | POA: Diagnosis not present

## 2016-04-07 DIAGNOSIS — Z79899 Other long term (current) drug therapy: Secondary | ICD-10-CM

## 2016-04-07 DIAGNOSIS — R918 Other nonspecific abnormal finding of lung field: Secondary | ICD-10-CM | POA: Diagnosis not present

## 2016-04-07 DIAGNOSIS — G629 Polyneuropathy, unspecified: Secondary | ICD-10-CM

## 2016-04-07 DIAGNOSIS — Z809 Family history of malignant neoplasm, unspecified: Secondary | ICD-10-CM

## 2016-04-07 DIAGNOSIS — R200 Anesthesia of skin: Secondary | ICD-10-CM

## 2016-04-07 DIAGNOSIS — M129 Arthropathy, unspecified: Secondary | ICD-10-CM

## 2016-04-07 DIAGNOSIS — G939 Disorder of brain, unspecified: Secondary | ICD-10-CM

## 2016-04-07 DIAGNOSIS — N289 Disorder of kidney and ureter, unspecified: Secondary | ICD-10-CM

## 2016-04-07 DIAGNOSIS — D721 Eosinophilia: Secondary | ICD-10-CM

## 2016-04-07 DIAGNOSIS — D72829 Elevated white blood cell count, unspecified: Secondary | ICD-10-CM

## 2016-04-07 DIAGNOSIS — R599 Enlarged lymph nodes, unspecified: Secondary | ICD-10-CM

## 2016-04-07 DIAGNOSIS — K769 Liver disease, unspecified: Secondary | ICD-10-CM

## 2016-04-07 DIAGNOSIS — M79605 Pain in left leg: Secondary | ICD-10-CM

## 2016-04-07 DIAGNOSIS — C3490 Malignant neoplasm of unspecified part of unspecified bronchus or lung: Secondary | ICD-10-CM | POA: Diagnosis not present

## 2016-04-07 DIAGNOSIS — D72822 Plasmacytosis: Secondary | ICD-10-CM

## 2016-04-07 DIAGNOSIS — Z801 Family history of malignant neoplasm of trachea, bronchus and lung: Secondary | ICD-10-CM

## 2016-04-07 DIAGNOSIS — M899 Disorder of bone, unspecified: Secondary | ICD-10-CM | POA: Diagnosis not present

## 2016-04-07 DIAGNOSIS — R634 Abnormal weight loss: Secondary | ICD-10-CM

## 2016-04-07 DIAGNOSIS — C787 Secondary malignant neoplasm of liver and intrahepatic bile duct: Secondary | ICD-10-CM | POA: Diagnosis not present

## 2016-04-07 DIAGNOSIS — R05 Cough: Secondary | ICD-10-CM

## 2016-04-07 DIAGNOSIS — R5383 Other fatigue: Secondary | ICD-10-CM

## 2016-04-07 LAB — MISC LABCORP TEST (SEND OUT): Labcorp test code: 511444

## 2016-04-07 MED ORDER — GABAPENTIN 300 MG PO CAPS: 300.0000 mg | ORAL_CAPSULE | Freq: Every day | ORAL | Status: DC

## 2016-04-07 MED ORDER — TECHNETIUM TC 99M MEDRONATE IV KIT
23.1800 | PACK | Freq: Once | INTRAVENOUS | Status: AC | PRN
  Administered 2016-04-07: 23.18 via INTRAVENOUS

## 2016-04-07 NOTE — Progress Notes (Signed)
[facility] day:  04/07/2016  Chief Complaint: Gerald [name] is a 65 y.o. male with metastatic lung cancer and associated hypereosinophilia who is seen for review of interval scans and finalization of treatment plan.  HPI:  The patient was last seen in the medical oncology clinic on 04/03/2016.  At that time, he was seen for assessment hospitalization for elevated troponin. Chest CT angiogram revealed no evidence of pulmonary embolism but a large right-sided chest mass with invasion into the left atrium. Bone marrow aspirate and biopsy and reactive leukocytosis and hypereosinophilia.  Bronchoscopy confirmed non-small cell lung cancer.  At last visit, we discussed plans for concurrent weekly carboplatin and Taxol with radiation followed by systemic chemotherapy.  Mutational analysis was sent. Head MRI revealed likely embolic infarcts from his tumor with no associated hemorrhage.  He had increased hemoptysis since his bronchoscopy. We discussed code status.  He wished to discuss it further with his family.  During the interim, he went to the chemotherapy class.  Chemotherapy will be given via peripheral vein.  PET scan on 04/05/2016 revealed intense hypermetabolism (SUV 17.95) associated with a 7.5 cm central perihilar right lung lesion. There was mediastinal (sub-carinal node SUV 8.89; 2.6 cm right paratracheal node SUV 17.49) and upper abdominal retroperitoneal hypermetabolic adenopathy (9 mm aortocaval node SUV 9.2). There were 2 liver metastasis (2.3 cm left lobe SUV 6.19; 2.2 cm segment VIII SUV 5.18). There are multifocal bone metastasis (C2 vertebral body SUV 9.0; 1.6 cm left sacral ala lesion SUV of 8.6).  He is scheduled for bone scan today.  Symptomatically, he denies any new complaints.  Past Medical History  Diagnosis Date  . Arthritis     Rheumatoid arthritis  . Pneumothorax     spontaneous  . Renal disorder     as a child  . Mass of  lung   . Collagen vascular disease ([place])     RA    Past Surgical History  Procedure Laterality Date  . Ureter repair as a child    . Flexible bronchoscopy N/A 03/30/2016    Procedure: FLEXIBLE BRONCHOSCOPY;  Surgeon: Vilinda Boehringer, MD;  Location: [facility] ORS;  Service: Cardiopulmonary;  Laterality: N/A;    Family History  Problem Relation Age of Onset  . Brain cancer Mother   . Lung cancer Maternal Uncle   . Cancer Sister     Metastatic cancer- unknown primary    Social History:  reports that he quit smoking about 15 years ago. His smoking use included Cigarettes. He has a 13 pack-year smoking history. He does not have any smokeless tobacco history on file. He reports that he drinks alcohol. He reports that he does not use illicit drugs.  He lives in [place].  Contact number is [phone].  The patient is alone today.  Allergies:  Allergies  Allergen Reactions  . Nsaids Other (See Comments)    Pt is unable to take this class of medications.    . Sulfa Antibiotics Other (See Comments)    Reaction:  GI upset     Current Medications: Current Outpatient Prescriptions  Medication Sig Dispense Refill  . atorvastatin (LIPITOR) 40 MG tablet Take 1 tablet (40 mg total) by mouth daily. 30 tablet 0   No current facility-administered medications for this visit.    Review of Systems:  GENERAL:  Fatigue.  No fevers or sweats.  Weight loss of 30 pounds since 12/2015. PERFORMANCE STATUS (ECOG):  1 HEENT:  No diplopia.  Pollen allergy.  No sore throat, mouth sores or tenderness. Lungs:  Shortness of breath.  Cough.  Hemoptysis. Cardiac:  No chest pain, palpitations, orthopnea, or PND.  Sleeps in a recliner. GI:  Poor appetite.  No nausea, vomiting, diarrhea, constipation, melena or hematochezia. GU:  No urgency, frequency, dysuria, or hematuria. Musculoskeletal:  No back pain.  No joint pain.  No muscle tenderness. Extremities:  No pain or swelling. Skin:  No rashes or skin  changes. Neuro:  Rare headache.  Little numbness bottom of left foot. No weakness, balance or coordination issues. Endocrine:  No diabetes, thyroid issues, hot flashes or night sweats. Psych:  No mood changes, depression or anxiety. Pain:  Left foot discomfort. Review of systems:  All other systems reviewed and found to be negative.  Physical Exam: Blood pressure 113/76, pulse 88, temperature 98 F (36.7 C), temperature source Tympanic, weight 132 lb 0.9 oz (59.9 kg). GENERAL:  Thin gentleman sitting comfortably in the exam room in no acute distress. MENTAL STATUS:  Alert and oriented to person, place and time. HEAD:  Long gray hair in pony tail.  Goatee.  Temporal wasting.  Normocephalic, atraumatic, face symmetric, no Cushingoid features. EYES:  Blue eyes.  No conjunctivitis or scleral icterus. NEUROLOGICAL: Appropriate. PSYCH:  Appropriate.   Hospital Outpatient Visit on 04/05/2016  Component Date Value Ref Range Status  . Glucose-Capillary 04/05/2016 82  65 - 99 mg/dL Final    Assessment:  Gerald [name] is a 65 y.o. male with stage IV non-small cell lung cancer and associated leukocytosis with eosinophilia. He presented with a 30 pound weight loss, shortness of breath, cough, and left leg discomfort. He has a 13-15 pack year smoking history.  Chest CT angiogram on 03/27/2016 revealed an 8 cm mass in the medial right upper lobe. The mass abutted the mediastinum and possibly invaded the left atrium. There was associated widespread thoracic adenopathy. There was a 2.6 cm enhancing lesion in the medial segment of the left hepatic lobe.  Bronchoscopy on 03/30/2016 revealed an endobronchial lesion in the RUL anterior segment with 95% occlusion. There was extrinsic compression in the right middle lobe secondary to posterior mass effect. Pathology confirmed non-small cell lung cancer, favor adenocarcinoma.  CEA was 2.3 on 03/27/2016.  PET scan on 04/05/2016 revealed intense  hypermetabolism (SUV 17.95) associated with a 7.5 cm central perihilar right lung lesion. There was mediastinal (sub-carinal node SUV 8.89; 2.6 cm right paratracheal node SUV 17.49) and upper abdominal retroperitoneal hypermetabolic adenopathy (9 mm aortocaval node SUV 9.2). There were 2 liver metastasis (2.3 cm left lobe SUV 6.19; 2.2 cm segment VIII SUV 5.18). There are multifocal bone metastasis (C2 vertebral body SUV 9.0; 1.6 cm left sacral ala lesion SUV of 8.6).  Head MRI on 03/31/2106 was indeterminate for early metastatic disease to the brain. There was a small focus of gyral enhancement in the anterior right frontal lobe likely is post ischemic, while a small posterior right cerebellar enhancing lesion was more suspicious for small brain metastasis. There were multiple small primarily subacute appearing infarcts in the bilateral MCA and left PICA territories. There was no associated hemorrhage or mass effect. There was osseous metastatic disease in the cervical spine.   He has marked leukocytosis with hypereosinophilia. Initial WBC was 72,300 with 53% eosinophils. Bone marrow on 03/29/2016 revealed marked increase in marrow eosinophils (approximate 30%). There was no diagnostic morphologic evidence of a myeloproliferative or lymphoid neoplasm. Flow cytometry revealed significant increase in eosinophils (54%).  There was relative decreased myeloid cells with no significant immunophenotypic abnormalities or increase in blasts. There was no lymphoid abnormalities or evidence of clonality. FISH studies were negative for myeloproliferative neoplasms with eosinophilia (PDGFRA, PDGFRB, FGFR1). BCR-ABL was negative on 03/28/2016.  He has unexplained left leg discomfort. Left lower extremity duplex on 03/25/2016 revealed no evidence of DVT. He may have a peripheral neuropathy secondary to his hypereosinophilia.  He is scheduled to begin radiation on 04/11/2016.  Symptomatically, he is fatigued.  He has ongoing left foot discomfort. Exam is stable.  Plan: 1.  Review PET scan in detail with patient.  Images reviewed. 2.  Discuss plan for concurrent weekly carboplatin and Taxol with radiation starting next week.  Radiation starts on 04/11/2016. 3.  Discuss neuropathy.  Patient would like to try Neurontin.  Side effects reviewed. 4.  Rx:  Neurontin 300 mg po qhs Dis: #30; titrate dose every 2-3 days. 5.  Follow-up bone scan today. 6.  Discuss Xgeva for prevention of bone related events due to malignancy.  Side effects reviewed.  Discussed hypocalcemia and osteonecrosis of the jaw.  Discuss evaluation by dentistry. 7.  Samara Deist. 8.  RTC next week for MD assessment, labs (CBC with diff, CMP, Mg), and week #1 carboplatin and Taxol.   Lequita Asal, MD  04/07/2016, 11:35 AM

## 2016-04-09 ENCOUNTER — Encounter: Payer: Self-pay | Admitting: Hematology and Oncology

## 2016-04-09 DIAGNOSIS — I639 Cerebral infarction, unspecified: Secondary | ICD-10-CM | POA: Insufficient documentation

## 2016-04-09 NOTE — Addendum Note (Signed)
Addended by: Luella Cook on: 04/09/2016 07:38 PM   Modules accepted: Orders

## 2016-04-10 ENCOUNTER — Ambulatory Visit
Admission: RE | Admit: 2016-04-10 | Discharge: 2016-04-10 | Disposition: A | Discharge status: Home / Self Care | Payer: Medicare Other | Source: Ambulatory Visit | Attending: Radiation Oncology | Admitting: Radiation Oncology

## 2016-04-10 ENCOUNTER — Inpatient Hospital Stay: Payer: Medicare Other | Admitting: Hematology and Oncology

## 2016-04-10 DIAGNOSIS — Z510 Encounter for antineoplastic radiation therapy: Secondary | ICD-10-CM | POA: Diagnosis not present

## 2016-04-10 DIAGNOSIS — C3491 Malignant neoplasm of unspecified part of right bronchus or lung: Secondary | ICD-10-CM | POA: Diagnosis not present

## 2016-04-10 DIAGNOSIS — C3411 Malignant neoplasm of upper lobe, right bronchus or lung: Secondary | ICD-10-CM | POA: Diagnosis not present

## 2016-04-11 ENCOUNTER — Ambulatory Visit
Admission: RE | Admit: 2016-04-11 | Discharge: 2016-04-11 | Disposition: A | Discharge status: Home / Self Care | Payer: Medicare Other | Source: Ambulatory Visit | Attending: Radiation Oncology | Admitting: Radiation Oncology

## 2016-04-11 DIAGNOSIS — C3411 Malignant neoplasm of upper lobe, right bronchus or lung: Secondary | ICD-10-CM | POA: Diagnosis not present

## 2016-04-11 DIAGNOSIS — Z510 Encounter for antineoplastic radiation therapy: Secondary | ICD-10-CM | POA: Diagnosis not present

## 2016-04-12 ENCOUNTER — Ambulatory Visit
Admission: RE | Admit: 2016-04-12 | Discharge: 2016-04-12 | Disposition: A | Discharge status: Home / Self Care | Payer: Medicare Other | Source: Ambulatory Visit | Attending: Radiation Oncology | Admitting: Radiation Oncology

## 2016-04-12 ENCOUNTER — Ambulatory Visit: Payer: Medicare Other

## 2016-04-12 DIAGNOSIS — C3411 Malignant neoplasm of upper lobe, right bronchus or lung: Secondary | ICD-10-CM | POA: Diagnosis not present

## 2016-04-12 DIAGNOSIS — Z510 Encounter for antineoplastic radiation therapy: Secondary | ICD-10-CM | POA: Diagnosis not present

## 2016-04-13 ENCOUNTER — Ambulatory Visit
Admission: RE | Admit: 2016-04-13 | Discharge: 2016-04-13 | Disposition: A | Discharge status: Home / Self Care | Payer: Medicare Other | Source: Ambulatory Visit | Attending: Radiation Oncology | Admitting: Radiation Oncology

## 2016-04-13 ENCOUNTER — Ambulatory Visit: Payer: Medicare Other

## 2016-04-13 DIAGNOSIS — C3411 Malignant neoplasm of upper lobe, right bronchus or lung: Secondary | ICD-10-CM | POA: Diagnosis not present

## 2016-04-13 DIAGNOSIS — Z510 Encounter for antineoplastic radiation therapy: Secondary | ICD-10-CM | POA: Diagnosis not present

## 2016-04-13 NOTE — Progress Notes (Unsigned)
PSN has attempted to call patient several times over the last 2 days.  Patient does not answer and does not have a voice mail so that a message can be left.

## 2016-04-14 ENCOUNTER — Inpatient Hospital Stay: Payer: Medicare Other | Attending: Hematology and Oncology

## 2016-04-14 ENCOUNTER — Other Ambulatory Visit: Payer: Self-pay | Admitting: Hematology and Oncology

## 2016-04-14 ENCOUNTER — Inpatient Hospital Stay: Payer: Medicare Other

## 2016-04-14 ENCOUNTER — Ambulatory Visit
Admission: RE | Admit: 2016-04-14 | Discharge: 2016-04-14 | Disposition: A | Discharge status: Home / Self Care | Payer: Medicare Other | Source: Ambulatory Visit | Attending: Radiation Oncology | Admitting: Radiation Oncology

## 2016-04-14 ENCOUNTER — Inpatient Hospital Stay (HOSPITAL_BASED_OUTPATIENT_CLINIC_OR_DEPARTMENT_OTHER): Payer: Medicare Other | Admitting: Hematology and Oncology

## 2016-04-14 ENCOUNTER — Ambulatory Visit: Payer: Medicare Other

## 2016-04-14 VITALS — BP 99/63 | HR 109 | Temp 96.5°F | Ht 71.0 in | Wt 134.2 lb

## 2016-04-14 DIAGNOSIS — Z923 Personal history of irradiation: Secondary | ICD-10-CM | POA: Insufficient documentation

## 2016-04-14 DIAGNOSIS — C787 Secondary malignant neoplasm of liver and intrahepatic bile duct: Secondary | ICD-10-CM | POA: Insufficient documentation

## 2016-04-14 DIAGNOSIS — R042 Hemoptysis: Secondary | ICD-10-CM | POA: Insufficient documentation

## 2016-04-14 DIAGNOSIS — R079 Chest pain, unspecified: Secondary | ICD-10-CM | POA: Diagnosis not present

## 2016-04-14 DIAGNOSIS — Z87891 Personal history of nicotine dependence: Secondary | ICD-10-CM

## 2016-04-14 DIAGNOSIS — Z808 Family history of malignant neoplasm of other organs or systems: Secondary | ICD-10-CM

## 2016-04-14 DIAGNOSIS — D721 Eosinophilia: Secondary | ICD-10-CM

## 2016-04-14 DIAGNOSIS — R599 Enlarged lymph nodes, unspecified: Secondary | ICD-10-CM | POA: Insufficient documentation

## 2016-04-14 DIAGNOSIS — C3412 Malignant neoplasm of upper lobe, left bronchus or lung: Secondary | ICD-10-CM

## 2016-04-14 DIAGNOSIS — Z66 Do not resuscitate: Secondary | ICD-10-CM

## 2016-04-14 DIAGNOSIS — R5383 Other fatigue: Secondary | ICD-10-CM

## 2016-04-14 DIAGNOSIS — Z510 Encounter for antineoplastic radiation therapy: Secondary | ICD-10-CM | POA: Diagnosis not present

## 2016-04-14 DIAGNOSIS — G6289 Other specified polyneuropathies: Secondary | ICD-10-CM

## 2016-04-14 DIAGNOSIS — M79672 Pain in left foot: Secondary | ICD-10-CM | POA: Diagnosis not present

## 2016-04-14 DIAGNOSIS — Z79899 Other long term (current) drug therapy: Secondary | ICD-10-CM | POA: Insufficient documentation

## 2016-04-14 DIAGNOSIS — C7951 Secondary malignant neoplasm of bone: Secondary | ICD-10-CM

## 2016-04-14 DIAGNOSIS — C3411 Malignant neoplasm of upper lobe, right bronchus or lung: Secondary | ICD-10-CM

## 2016-04-14 DIAGNOSIS — M069 Rheumatoid arthritis, unspecified: Secondary | ICD-10-CM | POA: Insufficient documentation

## 2016-04-14 DIAGNOSIS — D72829 Elevated white blood cell count, unspecified: Secondary | ICD-10-CM

## 2016-04-14 DIAGNOSIS — I999 Unspecified disorder of circulatory system: Secondary | ICD-10-CM | POA: Diagnosis not present

## 2016-04-14 DIAGNOSIS — M25561 Pain in right knee: Secondary | ICD-10-CM | POA: Insufficient documentation

## 2016-04-14 DIAGNOSIS — Z801 Family history of malignant neoplasm of trachea, bronchus and lung: Secondary | ICD-10-CM

## 2016-04-14 DIAGNOSIS — Z5111 Encounter for antineoplastic chemotherapy: Secondary | ICD-10-CM | POA: Diagnosis not present

## 2016-04-14 NOTE — Progress Notes (Signed)
[facility] day:  04/14/2016  Chief Complaint: Gerald [name] is a 65 y.o. male with metastatic lung cancer and associated hypereosinophilia who is seen for assessment prior to week #1 carboplatin and Taxol with radiation.  HPI:  The patient was last seen in the medical oncology clinic on 04/07/2016.  At that time, he was fatigued. He had ongoing left foot discomfort. Exam was stable.  He was prescribed Neurontin.  We discussed initiation of treatment with weekly carboplatin and Taxol with radiation.  Radiation started on 04/11/2016.  Bone scan on 04/07/2016 revealed a subtle distal right femur lesion which might be a small metastasis.  Bone scan is felt to be an unreliable modality for evaluation of osseous metastatic disease as the bone metastases seen on the recent PET and MRI were not evident.  During the interim, he has been sleeping better with less foot pain on Neurontin.  Pain has decreased from a level 8 to a level 3-4.  He has some mild chronic shortness of breath.  He described a brief feeling of tightness around his chest.    He has been concerned about receiving chemotherapy after talking to other patients.  He does not want to feel discomfort.  He desires more quality of life than quantity of life.  He has decided on a DNR/DNI code status.  He would like paperwork for a living will and medical power of attorney (POA).   Past Medical History  Diagnosis Date  . Arthritis     Rheumatoid arthritis  . Pneumothorax     spontaneous  . Renal disorder     as a child  . Mass of lung   . Collagen vascular disease ([place])     RA    Past Surgical History  Procedure Laterality Date  . Ureter repair as a child    . Flexible bronchoscopy N/A 03/30/2016    Procedure: FLEXIBLE BRONCHOSCOPY;  Surgeon: Vilinda Boehringer, MD;  Location: [facility] ORS;  Service: Cardiopulmonary;  Laterality: N/A;    Family History  Problem Relation Age of Onset  . Brain  cancer Mother   . Lung cancer Maternal Uncle   . Cancer Sister     Metastatic cancer- unknown primary    Social History:  reports that he quit smoking about 15 years ago. His smoking use included Cigarettes. He has a 13 pack-year smoking history. He does not have any smokeless tobacco history on file. He reports that he drinks alcohol. He reports that he does not use illicit drugs.  He lives in [place].  Contact number is [phone].  The patient is alone today.  Allergies:  Allergies  Allergen Reactions  . Nsaids Other (See Comments)    Pt is unable to take this class of medications.    . Sulfa Antibiotics Other (See Comments)    Reaction:  GI upset     Current Medications: Current Outpatient Prescriptions  Medication Sig Dispense Refill  . atorvastatin (LIPITOR) 40 MG tablet Take 1 tablet (40 mg total) by mouth daily. 30 tablet 0  . gabapentin (NEURONTIN) 300 MG capsule Take 1 capsule (300 mg total) by mouth at bedtime. 30 capsule 0   No current facility-administered medications for this visit.    Review of Systems:  GENERAL:  Fatigue.  No fevers or sweats.  Weight up 2 pounds. PERFORMANCE STATUS (ECOG):  1 HEENT:  Pollen allergy.  No visual changes, sore throat, mouth sores or tenderness. Lungs:  Shortness of breath on exertion.  Cough.  Hemoptysis. Cardiac:  No chest pain, palpitations, orthopnea, or PND.  Sleeps in a recliner. GI:  Appetite 75%.  No nausea, vomiting, diarrhea, constipation, melena or hematochezia. GU:  No urgency, frequency, dysuria, or hematuria. Musculoskeletal:  No back pain.  No joint pain.  No muscle tenderness. Extremities:  No pain or swelling. Skin:  No rashes or skin changes. Neuro:  Rare headache.  Little numbness bottom of left foot. Discomfort improved on Neurontin.  No weakness, balance or coordination issues. Endocrine:  No diabetes, thyroid issues, hot flashes or night sweats. Psych:  Sleeping better.  No mood changes, depression or  anxiety. Pain:  Left foot discomfort, improved (level 3-4). Review of systems:  All other systems reviewed and found to be negative.  Physical Exam: Blood pressure 99/63, pulse 109, temperature 96.5 F (35.8 C), temperature source Tympanic, height 5' 11"  (1.803 m), weight 134 lb 2.4 oz (60.85 kg). GENERAL:  Thin gentleman sitting comfortably in the exam room in no acute distress. MENTAL STATUS:  Alert and oriented to person, place and time. HEAD:  Wearing a [employer] camo cap.  Long gray hair in pony tail.  Goatee.  Temporal wasting.  Normocephalic, atraumatic, face symmetric, no Cushingoid features. EYES:  Blue eyes.  Pupils equal round and reactive to light and accomodation.  No conjunctivitis or scleral icterus. ENT:  Oropharynx clear without lesion.  Tongue normal. Mucous membranes moist.  RESPIRATORY:  Clear to auscultation without rales, wheezes or rhonchi. CARDIOVASCULAR:  Regular rate and rhythm without murmur, rub or gallop. ABDOMEN:  Soft, non-tender, with active bowel sounds, and no hepatosplenomegaly.  No masses. SKIN:  No rashes, ulcers or lesions. EXTREMITIES: No edema, no skin discoloration or tenderness.  No palpable cords. LYMPH NODES: No palpable cervical, supraclavicular, axillary or inguinal adenopathy  NEUROLOGICAL: Unremarkable. PSYCH:  Appropriate.    No visits with results within 3 Day(s) from this visit. Latest known visit with results is:  Hospital Outpatient Visit on 04/05/2016  Component Date Value Ref Range Status  . Glucose-Capillary 04/05/2016 82  65 - 99 mg/dL Final    Assessment:  Gerald [name] is a 65 y.o. male with stage IV non-small cell lung cancer and associated leukocytosis with eosinophilia. He presented with a 30 pound weight loss, shortness of breath, cough, and left leg discomfort. He has a 13-15 pack year smoking history.  Chest CT angiogram on 03/27/2016 revealed an 8 cm mass in the medial right upper lobe. The mass abutted the mediastinum  and possibly invaded the left atrium. There was associated widespread thoracic adenopathy. There was a 2.6 cm enhancing lesion in the medial segment of the left hepatic lobe.  Bronchoscopy on 03/30/2016 revealed an endobronchial lesion in the RUL anterior segment with 95% occlusion. There was extrinsic compression in the right middle lobe secondary to posterior mass effect. Pathology confirmed non-small cell lung cancer, favor adenocarcinoma.  CEA was 2.3 on 03/27/2016.  PET scan on 04/05/2016 revealed intense hypermetabolism (SUV 17.95) associated with a 7.5 cm central perihilar right lung lesion. There was mediastinal (sub-carinal node SUV 8.89; 2.6 cm right paratracheal node SUV 17.49) and upper abdominal retroperitoneal hypermetabolic adenopathy (9 mm aortocaval node SUV 9.2). There were 2 liver metastasis (2.3 cm left lobe SUV 6.19; 2.2 cm segment VIII SUV 5.18). There are multifocal bone metastasis (C2 vertebral body SUV 9.0; 1.6 cm left sacral ala lesion SUV of 8.6).  Head MRI on 03/31/2106 was indeterminate for early metastatic disease to  the brain. There was a small focus of gyral enhancement in the anterior right frontal lobe likely is post ischemic, while a small posterior right cerebellar enhancing lesion was more suspicious for small brain metastasis. There were multiple small primarily subacute appearing infarcts in the bilateral MCA and left PICA territories. There was no associated hemorrhage or mass effect. There was osseous metastatic disease in the cervical spine.   Bone scan on 04/07/2016 revealed a subtle distal right femur lesion which might be a small metastasis.  Bone scan is felt to be an unreliable modality for evaluation of osseous metastatic disease as the bone metastases seen on the recent PET and MRI were not evident.  He has marked leukocytosis with hypereosinophilia. Initial WBC was 72,300 with 53% eosinophils. Bone marrow on 03/29/2016 revealed marked increase in  marrow eosinophils (approximate 30%). There was no diagnostic morphologic evidence of a myeloproliferative or lymphoid neoplasm. Flow cytometry revealed significant increase in eosinophils (54%). There was relative decreased myeloid cells with no significant immunophenotypic abnormalities or increase in blasts. There was no lymphoid abnormalities or evidence of clonality. FISH studies were negative for myeloproliferative neoplasms with eosinophilia (PDGFRA, PDGFRB, FGFR1). BCR-ABL was negative on 03/28/2016.  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 Left lower extremity duplex on 03/25/2016 revealed no evidence of DVT. Discomfort has improved on Neurontin 300 mg a day.  He began radiation on 04/11/2016.  Symptomatically, has improved left foot discomfort on Neurontin. Exam is stable.  Code status is DNR/DNI.  Plan: 1.  Discuss patient's thoughts about therapy.  Discuss side effects associated with low dose concurrent carboplatin and Taxol with radiation.  Patient wishes to defer today.  Discuss overall treatment plan. 2.  No chemotherapy today. 3.  Discuss neuropathy.  Pain dramatically improved with Neurontin at night.  Patient does not wish to advance dose. 4.  Discuss bone scan.  Discuss unreliable way to follow bone lesions. 5.  Discuss code status.  Patient does not want heroic measures.  Code status is DNR/DNI. 6.  Provide patient with papers for living will and medical power of attorney (POA). 7.  RTC on 04/17/2016 for MD assessment, labs (CBC with diff, CMP, Mg), and week #1 carboplatin and Taxol with radiation.   Lequita Asal, MD  04/14/2016, 10:04 AM

## 2016-04-14 NOTE — Progress Notes (Signed)
Pt report SOB on exertion and increased pain in chest at tumor site 3/10 on pain scale.  Pt reports BP is baseline heart rate higher but says aggravated because running behind

## 2016-04-16 ENCOUNTER — Other Ambulatory Visit: Payer: Self-pay | Admitting: Hematology and Oncology

## 2016-04-17 ENCOUNTER — Inpatient Hospital Stay (HOSPITAL_BASED_OUTPATIENT_CLINIC_OR_DEPARTMENT_OTHER): Payer: Medicare Other | Admitting: Hematology and Oncology

## 2016-04-17 ENCOUNTER — Inpatient Hospital Stay: Payer: Medicare Other

## 2016-04-17 ENCOUNTER — Encounter: Payer: Self-pay | Admitting: Hematology and Oncology

## 2016-04-17 ENCOUNTER — Ambulatory Visit
Admission: RE | Admit: 2016-04-17 | Discharge: 2016-04-17 | Disposition: A | Discharge status: Home / Self Care | Payer: Medicare Other | Source: Ambulatory Visit | Attending: Radiation Oncology | Admitting: Radiation Oncology

## 2016-04-17 ENCOUNTER — Ambulatory Visit: Payer: Medicare Other

## 2016-04-17 VITALS — BP 116/75 | HR 102 | Temp 97.0°F | Resp 21 | Ht 71.0 in | Wt 132.6 lb

## 2016-04-17 VITALS — BP 96/57 | HR 90 | Temp 96.0°F | Resp 20

## 2016-04-17 DIAGNOSIS — C7951 Secondary malignant neoplasm of bone: Secondary | ICD-10-CM

## 2016-04-17 DIAGNOSIS — D721 Eosinophilia: Secondary | ICD-10-CM

## 2016-04-17 DIAGNOSIS — D479 Neoplasm of uncertain behavior of lymphoid, hematopoietic and related tissue, unspecified: Secondary | ICD-10-CM

## 2016-04-17 DIAGNOSIS — R042 Hemoptysis: Secondary | ICD-10-CM

## 2016-04-17 DIAGNOSIS — C3411 Malignant neoplasm of upper lobe, right bronchus or lung: Secondary | ICD-10-CM

## 2016-04-17 DIAGNOSIS — D72829 Elevated white blood cell count, unspecified: Secondary | ICD-10-CM

## 2016-04-17 DIAGNOSIS — I999 Unspecified disorder of circulatory system: Secondary | ICD-10-CM

## 2016-04-17 DIAGNOSIS — Z5111 Encounter for antineoplastic chemotherapy: Secondary | ICD-10-CM | POA: Diagnosis not present

## 2016-04-17 DIAGNOSIS — Z66 Do not resuscitate: Secondary | ICD-10-CM

## 2016-04-17 DIAGNOSIS — C3412 Malignant neoplasm of upper lobe, left bronchus or lung: Secondary | ICD-10-CM

## 2016-04-17 DIAGNOSIS — Z79899 Other long term (current) drug therapy: Secondary | ICD-10-CM

## 2016-04-17 DIAGNOSIS — R5383 Other fatigue: Secondary | ICD-10-CM

## 2016-04-17 DIAGNOSIS — C787 Secondary malignant neoplasm of liver and intrahepatic bile duct: Secondary | ICD-10-CM

## 2016-04-17 DIAGNOSIS — G893 Neoplasm related pain (acute) (chronic): Secondary | ICD-10-CM | POA: Insufficient documentation

## 2016-04-17 DIAGNOSIS — Z510 Encounter for antineoplastic radiation therapy: Secondary | ICD-10-CM | POA: Diagnosis not present

## 2016-04-17 DIAGNOSIS — G6289 Other specified polyneuropathies: Secondary | ICD-10-CM

## 2016-04-17 DIAGNOSIS — R079 Chest pain, unspecified: Secondary | ICD-10-CM | POA: Diagnosis not present

## 2016-04-17 DIAGNOSIS — R599 Enlarged lymph nodes, unspecified: Secondary | ICD-10-CM

## 2016-04-17 DIAGNOSIS — M79672 Pain in left foot: Secondary | ICD-10-CM

## 2016-04-17 DIAGNOSIS — C3491 Malignant neoplasm of unspecified part of right bronchus or lung: Secondary | ICD-10-CM | POA: Diagnosis not present

## 2016-04-17 DIAGNOSIS — Z801 Family history of malignant neoplasm of trachea, bronchus and lung: Secondary | ICD-10-CM

## 2016-04-17 DIAGNOSIS — Z808 Family history of malignant neoplasm of other organs or systems: Secondary | ICD-10-CM

## 2016-04-17 DIAGNOSIS — E43 Unspecified severe protein-calorie malnutrition: Secondary | ICD-10-CM

## 2016-04-17 DIAGNOSIS — M069 Rheumatoid arthritis, unspecified: Secondary | ICD-10-CM

## 2016-04-17 DIAGNOSIS — Z923 Personal history of irradiation: Secondary | ICD-10-CM

## 2016-04-17 DIAGNOSIS — Z87891 Personal history of nicotine dependence: Secondary | ICD-10-CM

## 2016-04-17 LAB — CBC WITH DIFFERENTIAL/PLATELET
Basophils Absolute: 0 10*3/uL (ref 0–0.1)
Basophils Relative: 0 %
Eosinophils Absolute: 42.6 10*3/uL — ABNORMAL HIGH (ref 0–0.7)
Eosinophils Relative: 60 %
HCT: 39.6 % — ABNORMAL LOW (ref 40.0–52.0)
Hemoglobin: 12.9 g/dL — ABNORMAL LOW (ref 13.0–18.0)
Lymphocytes Relative: 3 %
Lymphs Abs: 2.1 10*3/uL (ref 1.0–3.6)
MCH: 29.2 pg (ref 26.0–34.0)
MCHC: 32.6 g/dL (ref 32.0–36.0)
MCV: 89.5 fL (ref 80.0–100.0)
Monocytes Absolute: 3.6 10*3/uL — ABNORMAL HIGH (ref 0.2–1.0)
Monocytes Relative: 5 %
Neutro Abs: 22.8 10*3/uL — ABNORMAL HIGH (ref 1.4–6.5)
Neutrophils Relative %: 32 %
Platelets: 154 10*3/uL (ref 150–440)
RBC: 4.42 MIL/uL (ref 4.40–5.90)
RDW: 15.2 % — ABNORMAL HIGH (ref 11.5–14.5)
WBC: 71.1 10*3/uL (ref 3.8–10.6)

## 2016-04-17 LAB — COMPREHENSIVE METABOLIC PANEL
ALT: 26 U/L (ref 17–63)
AST: 31 U/L (ref 15–41)
Albumin: 2.7 g/dL — ABNORMAL LOW (ref 3.5–5.0)
Alkaline Phosphatase: 77 U/L (ref 38–126)
Anion gap: 8 (ref 5–15)
BUN: 13 mg/dL (ref 6–20)
CO2: 24 mmol/L (ref 22–32)
Calcium: 8.8 mg/dL — ABNORMAL LOW (ref 8.9–10.3)
Chloride: 103 mmol/L (ref 101–111)
Creatinine, Ser: 0.86 mg/dL (ref 0.61–1.24)
GFR calc Af Amer: >60 mL/min (ref >60)
GFR calc non Af Amer: >60 mL/min (ref >60)
Glucose, Bld: 112 mg/dL — ABNORMAL HIGH (ref 65–99)
Potassium: 5.3 mmol/L — ABNORMAL HIGH (ref 3.5–5.1)
Sodium: 135 mmol/L (ref 135–145)
Total Bilirubin: 0.6 mg/dL (ref 0.3–1.2)
Total Protein: 7.7 g/dL (ref 6.5–8.1)

## 2016-04-17 LAB — MAGNESIUM: Magnesium: 1.9 mg/dL (ref 1.7–2.4)

## 2016-04-17 MED ORDER — ONDANSETRON HCL 8 MG PO TABS: 8.0000 mg | ORAL_TABLET | Freq: Two times a day (BID) | ORAL | Status: DC | PRN

## 2016-04-17 MED ORDER — DIPHENHYDRAMINE HCL 50 MG/ML IJ SOLN
50.0000 mg | Freq: Once | INTRAMUSCULAR | Status: AC
  Administered 2016-04-17: 50 mg via INTRAVENOUS
  Filled 2016-04-17: qty 1

## 2016-04-17 MED ORDER — SODIUM CHLORIDE 0.9 % IV SOLN
196.0000 mg | Freq: Once | INTRAVENOUS | Status: AC
  Administered 2016-04-17: 200 mg via INTRAVENOUS
  Filled 2016-04-17: qty 20

## 2016-04-17 MED ORDER — PACLITAXEL CHEMO INJECTION 300 MG/50ML
45.0000 mg/m2 | Freq: Once | INTRAVENOUS | Status: AC
  Administered 2016-04-17: 78 mg via INTRAVENOUS
  Filled 2016-04-17: qty 13

## 2016-04-17 MED ORDER — SODIUM CHLORIDE 0.9 % IV SOLN
20.0000 mg | Freq: Once | INTRAVENOUS | Status: AC
  Administered 2016-04-17: 20 mg via INTRAVENOUS
  Filled 2016-04-17: qty 2

## 2016-04-17 MED ORDER — HYDROCODONE-ACETAMINOPHEN 5-325 MG PO TABS: 1.0000 | ORAL_TABLET | Freq: Four times a day (QID) | ORAL | Status: DC | PRN

## 2016-04-17 MED ORDER — PALONOSETRON HCL INJECTION 0.25 MG/5ML
0.2500 mg | Freq: Once | INTRAVENOUS | Status: AC
Start: 2016-04-17 — End: 2016-04-17
  Administered 2016-04-17: 0.25 mg via INTRAVENOUS
  Filled 2016-04-17: qty 5

## 2016-04-17 MED ORDER — FAMOTIDINE IN NACL 20-0.9 MG/50ML-% IV SOLN
20.0000 mg | Freq: Once | INTRAVENOUS | Status: AC
Start: 2016-04-17 — End: 2016-04-17
  Administered 2016-04-17: 20 mg via INTRAVENOUS
  Filled 2016-04-17: qty 50

## 2016-04-17 MED ORDER — SODIUM CHLORIDE 0.9 % IV SOLN
Freq: Once | INTRAVENOUS | Status: AC
  Administered 2016-04-17: 12:00:00 via INTRAVENOUS
  Filled 2016-04-17: qty 1000

## 2016-04-17 NOTE — Progress Notes (Signed)
Pt reports right-sided chest pain about 4-5 inches directly below armpit.  Pt reports the pain is worse when picking up something.  Pt reports he still coughs up blood mostly dark at times bright red.

## 2016-04-17 NOTE — Progress Notes (Signed)
Spoke with Judeen Hammans regarding treatment, waiting on Pending results for differential, Dr. Mike Gip wants to go ahead with treatment prior to these resulting.  LJ

## 2016-04-17 NOTE — Progress Notes (Signed)
[facility] day:  04/17/2016  Chief Complaint: Gerald [name] is a 66 y.o. male with metastatic lung cancer and associated hypereosinophilia who is seen for assessment prior to week #1 carboplatin and Taxol with radiation.  HPI:  The patient was last seen in the medical oncology clinic on 04/14/2016.  At that time, he wished to defer treatment.  He was undecided about receiving chemotherapy.  His neuropathy had improved on Neurontin.  We discussed code status issues.  Code status was defined as DNR/DNI.  He requested papaers for a living will and medical power of attorney.  During the interim, he notes some discomfort on the right sided chest discomfort.  He feels his pain is well controlled.  He continues to have hemoptysis, sometimes bright red, sometimes dark.  He is able to sleep well at night (6-7 hours).   Past Medical History  Diagnosis Date  . Arthritis     Rheumatoid arthritis  . Pneumothorax     spontaneous  . Renal disorder     as a child  . Mass of lung   . Collagen vascular disease ([place])     RA    Past Surgical History  Procedure Laterality Date  . Ureter repair as a child    . Flexible bronchoscopy N/A 03/30/2016    Procedure: FLEXIBLE BRONCHOSCOPY;  Surgeon: Vilinda Boehringer, MD;  Location: [facility] ORS;  Service: Cardiopulmonary;  Laterality: N/A;    Family History  Problem Relation Age of Onset  . Brain cancer Mother   . Lung cancer Maternal Uncle   . Cancer Sister     Metastatic cancer- unknown primary    Social History:  reports that he quit smoking about 15 years ago. His smoking use included Cigarettes. He has a 13 pack-year smoking history. He does not have any smokeless tobacco history on file. He reports that he drinks alcohol. He reports that he does not use illicit drugs.  He lives in [place].  Contact number is [phone].  The patient is alone today.  Allergies:  Allergies  Allergen Reactions  . Nsaids  Other (See Comments)    Pt is unable to take this class of medications.    . Sulfa Antibiotics Other (See Comments)    Reaction:  GI upset     Current Medications: Current Outpatient Prescriptions  Medication Sig Dispense Refill  . atorvastatin (LIPITOR) 40 MG tablet Take 1 tablet (40 mg total) by mouth daily. 30 tablet 0  . gabapentin (NEURONTIN) 300 MG capsule Take 1 capsule (300 mg total) by mouth at bedtime. 30 capsule 0   No current facility-administered medications for this visit.    Review of Systems:  GENERAL:  Fatigue.  No fevers or sweats.  Weight down 2 pounds. PERFORMANCE STATUS (ECOG):  1 HEENT:  Pollen allergy.  No visual changes, sore throat, mouth sores or tenderness. Lungs:  Shortness of breath on exertion.  Cough.  Hemoptysis (bright red and dark). Cardiac:  No chest pain, palpitations, orthopnea, or PND.  Sleeps in a recliner. GI:  Appetite 75%.  No nausea, vomiting, diarrhea, constipation, melena or hematochezia. GU:  No urgency, frequency, dysuria, or hematuria. Musculoskeletal:  No back pain.  No joint pain.  No muscle tenderness. Extremities:  No pain or swelling. Skin:  No rashes or skin changes. Neuro:  Little numbness bottom of left foot. Discomfort improved on Neurontin.  No headache, weakness, balance or coordination issues. Endocrine:  No diabetes, thyroid  issues, hot flashes or night sweats. Psych:  Sleeping better.  No mood changes, depression or anxiety. Pain:  Right sided chest discomfort.  Left foot discomfort (level 3-4). Review of systems:  All other systems reviewed and found to be negative.  Physical Exam: Blood pressure 116/75, pulse 102, temperature 97 F (36.1 C), temperature source Tympanic, resp. rate 21, height 5\' 11"  (1.803 m), weight 132 lb 9.7 oz (60.15 kg). GENERAL:  Thin gentleman sitting comfortably in the exam room in no acute distress. MENTAL STATUS:  Alert and oriented to person, place and time. HEAD:  Wearing a red cap with  sunglasses.  Long gray hair in pony tail.  Goatee.  Temporal wasting.  Normocephalic, atraumatic, face symmetric, no Cushingoid features. EYES:  Blue eyes.  Pupils equal round and reactive to light and accomodation.  No conjunctivitis or scleral icterus. ENT:  Oropharynx clear without lesion.  Tongue normal. Mucous membranes moist.  RESPIRATORY:  Clear to auscultation without rales, wheezes or rhonchi. CARDIOVASCULAR:  Regular rate and rhythm without murmur, rub or gallop. ABDOMEN:  Soft, non-tender, with active bowel sounds, and no hepatosplenomegaly.  No masses. SKIN:  No rashes, ulcers or lesions. EXTREMITIES: No edema, no skin discoloration or tenderness.  No palpable cords. LYMPH NODES: No palpable cervical, supraclavicular, axillary or inguinal adenopathy  NEUROLOGICAL: Unremarkable. PSYCH:  Appropriate.    Appointment on 04/17/2016  Component Date Value Ref Range Status  . Sodium 04/17/2016 135  135 - 145 mmol/L Final  . Potassium 04/17/2016 5.3* 3.5 - 5.1 mmol/L Final  . Chloride 04/17/2016 103  101 - 111 mmol/L Final  . CO2 04/17/2016 24  22 - 32 mmol/L Final  . Glucose, Bld 04/17/2016 112* 65 - 99 mg/dL Final  . BUN 06/17/2016 13  6 - 20 mg/dL Final  . Creatinine, Ser 04/17/2016 0.86  0.61 - 1.24 mg/dL Final  . Calcium 06/17/2016 8.8* 8.9 - 10.3 mg/dL Final  . Total Protein 04/17/2016 7.7  6.5 - 8.1 g/dL Final  . Albumin 06/17/2016 2.7* 3.5 - 5.0 g/dL Final  . AST 74/83/2252 31  15 - 41 U/L Final  . ALT 04/17/2016 26  17 - 63 U/L Final  . Alkaline Phosphatase 04/17/2016 77  38 - 126 U/L Final  . Total Bilirubin 04/17/2016 0.6  0.3 - 1.2 mg/dL Final  . GFR calc non Af Amer 04/17/2016 >60  >60 mL/min Final  . GFR calc Af Amer 04/17/2016 >60  >60 mL/min Final   Comment: (NOTE) The eGFR has been calculated using the CKD EPI equation. This calculation has not been validated in all clinical situations. eGFR's persistently <60 mL/min signify possible Chronic Kidney Disease.    . Anion gap 04/17/2016 8  5 - 15 Final  . Magnesium 04/17/2016 1.9  1.7 - 2.4 mg/dL Final    Assessment:  Gerald [name] is a 65 y.o. male with stage IV non-small cell lung cancer and associated leukocytosis with eosinophilia. He presented with a 30 pound weight loss, shortness of breath, cough, and left leg discomfort. He has a 13-15 pack year smoking history.  Chest CT angiogram on 03/27/2016 revealed an 8 cm mass in the medial right upper lobe. The mass abutted the mediastinum and possibly invaded the left atrium. There was associated widespread thoracic adenopathy. There was a 2.6 cm enhancing lesion in the medial segment of the left hepatic lobe.  Bronchoscopy on 03/30/2016 revealed an endobronchial lesion in the RUL anterior segment with 95% occlusion. There was extrinsic compression in  the right middle lobe secondary to posterior mass effect. Pathology confirmed non-small cell lung cancer, favor adenocarcinoma.  CEA was 2.3 on 03/27/2016.  PET scan on 04/05/2016 revealed intense hypermetabolism (SUV 17.95) associated with a 7.5 cm central perihilar right lung lesion. There was mediastinal (sub-carinal node SUV 8.89; 2.6 cm right paratracheal node SUV 17.49) and upper abdominal retroperitoneal hypermetabolic adenopathy (9 mm aortocaval node SUV 9.2). There were 2 liver metastasis (2.3 cm left lobe SUV 6.19; 2.2 cm segment VIII SUV 5.18). There are multifocal bone metastasis (C2 vertebral body SUV 9.0; 1.6 cm left sacral ala lesion SUV of 8.6).  Head MRI on 03/31/2106 was indeterminate for early metastatic disease to the brain. There was a small focus of gyral enhancement in the anterior right frontal lobe likely is post ischemic, while a small posterior right cerebellar enhancing lesion was more suspicious for small brain metastasis. There were multiple small primarily subacute appearing infarcts in the bilateral MCA and left PICA territories. There was no associated hemorrhage or mass  effect. There was osseous metastatic disease in the cervical spine.   Bone scan on 04/07/2016 revealed a subtle distal right femur lesion which might be a small metastasis.  Bone scan is felt to be an unreliable modality for evaluation of osseous metastatic disease as the bone metastases seen on the recent PET and MRI were not evident.  He has marked leukocytosis with hypereosinophilia. Initial WBC was 72,300 with 53% eosinophils. Bone marrow on 03/29/2016 revealed marked increase in marrow eosinophils (approximate 30%). There was no diagnostic morphologic evidence of a myeloproliferative or lymphoid neoplasm. Flow cytometry revealed significant increase in eosinophils (54%). There was relative decreased myeloid cells with no significant immunophenotypic abnormalities or increase in blasts. There was no lymphoid abnormalities or evidence of clonality. FISH studies were negative for myeloproliferative neoplasms with eosinophilia (PDGFRA, PDGFRB, FGFR1). BCR-ABL was negative on 03/28/2016.  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 Left lower extremity duplex on 03/25/2016 revealed no evidence of DVT. Discomfort has improved on Neurontin 300 mg a day.  He began radiation on 04/11/2016.  Symptomatically, he has intermittent right chest pain and hemoptysis. He has lost 2 pounds.  Exam is stable.  Code status is DNR/DNI.  Potassium is elevated.  Plan: 1.  Labs today:  CBC with diff, CMP, Mg. 2.  Week #1 carboplatin and Taxol. 3.  Recheck potassium. 4.  Discuss caloric intake. 5.  Continue current dose of Neurontin. 6.  Rx:  Lortab 5/325 1 tablet po q 6 hours prn pain.  Dis: # 30. 7.  RTC in 1 week for MD assessment, labs (CBC with diff, CMP, Mg), and week #2 carboplatin and Taxol.   Lequita Asal, MD  04/17/2016, 10:22 AM

## 2016-04-18 ENCOUNTER — Encounter: Payer: Self-pay | Admitting: Hematology and Oncology

## 2016-04-18 ENCOUNTER — Ambulatory Visit
Admission: RE | Admit: 2016-04-18 | Discharge: 2016-04-18 | Disposition: A | Discharge status: Home / Self Care | Payer: Medicare Other | Source: Ambulatory Visit | Attending: Radiation Oncology | Admitting: Radiation Oncology

## 2016-04-18 ENCOUNTER — Ambulatory Visit: Payer: Medicare Other

## 2016-04-18 DIAGNOSIS — C3411 Malignant neoplasm of upper lobe, right bronchus or lung: Secondary | ICD-10-CM | POA: Diagnosis not present

## 2016-04-18 DIAGNOSIS — Z510 Encounter for antineoplastic radiation therapy: Secondary | ICD-10-CM | POA: Diagnosis not present

## 2016-04-19 ENCOUNTER — Inpatient Hospital Stay: Payer: Medicare Other

## 2016-04-19 ENCOUNTER — Ambulatory Visit
Admission: RE | Admit: 2016-04-19 | Discharge: 2016-04-19 | Disposition: A | Discharge status: Home / Self Care | Payer: Medicare Other | Source: Ambulatory Visit | Attending: Radiation Oncology | Admitting: Radiation Oncology

## 2016-04-19 ENCOUNTER — Ambulatory Visit: Payer: Medicare Other

## 2016-04-19 DIAGNOSIS — Z5111 Encounter for antineoplastic chemotherapy: Secondary | ICD-10-CM | POA: Diagnosis not present

## 2016-04-19 DIAGNOSIS — R079 Chest pain, unspecified: Secondary | ICD-10-CM | POA: Diagnosis not present

## 2016-04-19 DIAGNOSIS — Z510 Encounter for antineoplastic radiation therapy: Secondary | ICD-10-CM | POA: Diagnosis not present

## 2016-04-19 DIAGNOSIS — E43 Unspecified severe protein-calorie malnutrition: Secondary | ICD-10-CM

## 2016-04-19 DIAGNOSIS — C7951 Secondary malignant neoplasm of bone: Secondary | ICD-10-CM

## 2016-04-19 DIAGNOSIS — R042 Hemoptysis: Secondary | ICD-10-CM | POA: Diagnosis not present

## 2016-04-19 DIAGNOSIS — D479 Neoplasm of uncertain behavior of lymphoid, hematopoietic and related tissue, unspecified: Secondary | ICD-10-CM

## 2016-04-19 DIAGNOSIS — G893 Neoplasm related pain (acute) (chronic): Secondary | ICD-10-CM

## 2016-04-19 DIAGNOSIS — C3411 Malignant neoplasm of upper lobe, right bronchus or lung: Secondary | ICD-10-CM | POA: Diagnosis not present

## 2016-04-19 DIAGNOSIS — G6289 Other specified polyneuropathies: Secondary | ICD-10-CM

## 2016-04-19 DIAGNOSIS — C787 Secondary malignant neoplasm of liver and intrahepatic bile duct: Secondary | ICD-10-CM | POA: Diagnosis not present

## 2016-04-19 LAB — POTASSIUM: Potassium: 4.4 mmol/L (ref 3.5–5.1)

## 2016-04-20 ENCOUNTER — Ambulatory Visit: Payer: Medicare Other

## 2016-04-20 ENCOUNTER — Ambulatory Visit
Admission: RE | Admit: 2016-04-20 | Discharge: 2016-04-20 | Disposition: A | Discharge status: Home / Self Care | Payer: Medicare Other | Source: Ambulatory Visit | Attending: Radiation Oncology | Admitting: Radiation Oncology

## 2016-04-20 DIAGNOSIS — Z510 Encounter for antineoplastic radiation therapy: Secondary | ICD-10-CM | POA: Diagnosis not present

## 2016-04-20 DIAGNOSIS — C3411 Malignant neoplasm of upper lobe, right bronchus or lung: Secondary | ICD-10-CM | POA: Diagnosis not present

## 2016-04-21 ENCOUNTER — Ambulatory Visit: Payer: Medicare Other

## 2016-04-21 ENCOUNTER — Ambulatory Visit
Admission: RE | Admit: 2016-04-21 | Discharge: 2016-04-21 | Disposition: A | Discharge status: Home / Self Care | Payer: Medicare Other | Source: Ambulatory Visit | Attending: Radiation Oncology | Admitting: Radiation Oncology

## 2016-04-21 DIAGNOSIS — C3411 Malignant neoplasm of upper lobe, right bronchus or lung: Secondary | ICD-10-CM | POA: Diagnosis not present

## 2016-04-21 DIAGNOSIS — Z510 Encounter for antineoplastic radiation therapy: Secondary | ICD-10-CM | POA: Diagnosis not present

## 2016-04-23 ENCOUNTER — Other Ambulatory Visit: Payer: Self-pay | Admitting: Hematology and Oncology

## 2016-04-24 ENCOUNTER — Inpatient Hospital Stay: Payer: Medicare Other

## 2016-04-24 ENCOUNTER — Ambulatory Visit: Payer: Medicare Other

## 2016-04-24 ENCOUNTER — Ambulatory Visit
Admission: RE | Admit: 2016-04-24 | Discharge: 2016-04-24 | Disposition: A | Discharge status: Home / Self Care | Payer: Medicare Other | Source: Ambulatory Visit | Attending: Radiation Oncology | Admitting: Radiation Oncology

## 2016-04-24 ENCOUNTER — Inpatient Hospital Stay (HOSPITAL_BASED_OUTPATIENT_CLINIC_OR_DEPARTMENT_OTHER): Payer: Medicare Other | Admitting: Hematology and Oncology

## 2016-04-24 ENCOUNTER — Encounter: Payer: Self-pay | Admitting: Hematology and Oncology

## 2016-04-24 ENCOUNTER — Encounter: Payer: Self-pay | Admitting: Internal Medicine

## 2016-04-24 VITALS — BP 106/68 | HR 102 | Temp 95.0°F | Resp 19 | Ht 71.0 in | Wt 133.3 lb

## 2016-04-24 DIAGNOSIS — G6289 Other specified polyneuropathies: Secondary | ICD-10-CM

## 2016-04-24 DIAGNOSIS — R042 Hemoptysis: Secondary | ICD-10-CM | POA: Diagnosis not present

## 2016-04-24 DIAGNOSIS — R5383 Other fatigue: Secondary | ICD-10-CM

## 2016-04-24 DIAGNOSIS — M069 Rheumatoid arthritis, unspecified: Secondary | ICD-10-CM

## 2016-04-24 DIAGNOSIS — C3411 Malignant neoplasm of upper lobe, right bronchus or lung: Secondary | ICD-10-CM

## 2016-04-24 DIAGNOSIS — E43 Unspecified severe protein-calorie malnutrition: Secondary | ICD-10-CM

## 2016-04-24 DIAGNOSIS — Z87891 Personal history of nicotine dependence: Secondary | ICD-10-CM

## 2016-04-24 DIAGNOSIS — M79672 Pain in left foot: Secondary | ICD-10-CM

## 2016-04-24 DIAGNOSIS — C7951 Secondary malignant neoplasm of bone: Secondary | ICD-10-CM

## 2016-04-24 DIAGNOSIS — C3412 Malignant neoplasm of upper lobe, left bronchus or lung: Secondary | ICD-10-CM

## 2016-04-24 DIAGNOSIS — M25561 Pain in right knee: Secondary | ICD-10-CM

## 2016-04-24 DIAGNOSIS — C3491 Malignant neoplasm of unspecified part of right bronchus or lung: Secondary | ICD-10-CM | POA: Diagnosis not present

## 2016-04-24 DIAGNOSIS — Z66 Do not resuscitate: Secondary | ICD-10-CM

## 2016-04-24 DIAGNOSIS — G893 Neoplasm related pain (acute) (chronic): Secondary | ICD-10-CM

## 2016-04-24 DIAGNOSIS — D479 Neoplasm of uncertain behavior of lymphoid, hematopoietic and related tissue, unspecified: Secondary | ICD-10-CM

## 2016-04-24 DIAGNOSIS — Z5111 Encounter for antineoplastic chemotherapy: Secondary | ICD-10-CM | POA: Diagnosis not present

## 2016-04-24 DIAGNOSIS — R079 Chest pain, unspecified: Secondary | ICD-10-CM

## 2016-04-24 DIAGNOSIS — Z808 Family history of malignant neoplasm of other organs or systems: Secondary | ICD-10-CM

## 2016-04-24 DIAGNOSIS — C787 Secondary malignant neoplasm of liver and intrahepatic bile duct: Secondary | ICD-10-CM | POA: Diagnosis not present

## 2016-04-24 DIAGNOSIS — Z801 Family history of malignant neoplasm of trachea, bronchus and lung: Secondary | ICD-10-CM

## 2016-04-24 DIAGNOSIS — D72829 Elevated white blood cell count, unspecified: Secondary | ICD-10-CM

## 2016-04-24 DIAGNOSIS — D721 Eosinophilia: Secondary | ICD-10-CM

## 2016-04-24 DIAGNOSIS — R599 Enlarged lymph nodes, unspecified: Secondary | ICD-10-CM

## 2016-04-24 DIAGNOSIS — I639 Cerebral infarction, unspecified: Secondary | ICD-10-CM

## 2016-04-24 DIAGNOSIS — I999 Unspecified disorder of circulatory system: Secondary | ICD-10-CM

## 2016-04-24 DIAGNOSIS — Z79899 Other long term (current) drug therapy: Secondary | ICD-10-CM

## 2016-04-24 DIAGNOSIS — Z510 Encounter for antineoplastic radiation therapy: Secondary | ICD-10-CM | POA: Diagnosis not present

## 2016-04-24 DIAGNOSIS — Z923 Personal history of irradiation: Secondary | ICD-10-CM

## 2016-04-24 LAB — CBC WITH DIFFERENTIAL/PLATELET
Basophils Absolute: 0.1 10*3/uL (ref 0–0.1)
Basophils Relative: 0 %
Eosinophils Absolute: 19.5 10*3/uL — ABNORMAL HIGH (ref 0–0.7)
Eosinophils Relative: 52 %
HCT: 39.8 % — ABNORMAL LOW (ref 40.0–52.0)
Hemoglobin: 13.3 g/dL (ref 13.0–18.0)
Lymphocytes Relative: 3 %
Lymphs Abs: 0.9 10*3/uL — ABNORMAL LOW (ref 1.0–3.6)
MCH: 29.7 pg (ref 26.0–34.0)
MCHC: 33.5 g/dL (ref 32.0–36.0)
MCV: 88.9 fL (ref 80.0–100.0)
Monocytes Absolute: 1.5 10*3/uL — ABNORMAL HIGH (ref 0.2–1.0)
Monocytes Relative: 4 %
Neutro Abs: 15.3 10*3/uL — ABNORMAL HIGH (ref 1.4–6.5)
Neutrophils Relative %: 41 %
Platelets: 172 10*3/uL (ref 150–440)
RBC: 4.48 MIL/uL (ref 4.40–5.90)
RDW: 14.9 % — ABNORMAL HIGH (ref 11.5–14.5)
WBC: 37.4 10*3/uL — ABNORMAL HIGH (ref 3.8–10.6)

## 2016-04-24 LAB — COMPREHENSIVE METABOLIC PANEL
ALT: 39 U/L (ref 17–63)
AST: 27 U/L (ref 15–41)
Albumin: 3.2 g/dL — ABNORMAL LOW (ref 3.5–5.0)
Alkaline Phosphatase: 78 U/L (ref 38–126)
Anion gap: 6 (ref 5–15)
BUN: 18 mg/dL (ref 6–20)
CO2: 26 mmol/L (ref 22–32)
Calcium: 8.7 mg/dL — ABNORMAL LOW (ref 8.9–10.3)
Chloride: 102 mmol/L (ref 101–111)
Creatinine, Ser: 0.76 mg/dL (ref 0.61–1.24)
GFR calc Af Amer: >60 mL/min (ref >60)
GFR calc non Af Amer: >60 mL/min (ref >60)
Glucose, Bld: 101 mg/dL — ABNORMAL HIGH (ref 65–99)
Potassium: 3.9 mmol/L (ref 3.5–5.1)
Sodium: 134 mmol/L — ABNORMAL LOW (ref 135–145)
Total Bilirubin: 0.8 mg/dL (ref 0.3–1.2)
Total Protein: 7.7 g/dL (ref 6.5–8.1)

## 2016-04-24 LAB — MAGNESIUM: Magnesium: 1.8 mg/dL (ref 1.7–2.4)

## 2016-04-24 LAB — SURGICAL PATHOLOGY

## 2016-04-24 MED ORDER — SODIUM CHLORIDE 0.9 % IV SOLN
200.0000 mg | Freq: Once | INTRAVENOUS | Status: AC
  Administered 2016-04-24: 200 mg via INTRAVENOUS
  Filled 2016-04-24: qty 20

## 2016-04-24 MED ORDER — PALONOSETRON HCL INJECTION 0.25 MG/5ML
0.2500 mg | Freq: Once | INTRAVENOUS | Status: AC
  Administered 2016-04-24: 0.25 mg via INTRAVENOUS
  Filled 2016-04-24: qty 5

## 2016-04-24 MED ORDER — FAMOTIDINE IN NACL 20-0.9 MG/50ML-% IV SOLN
20.0000 mg | Freq: Once | INTRAVENOUS | Status: AC
  Administered 2016-04-24: 20 mg via INTRAVENOUS
  Filled 2016-04-24: qty 50

## 2016-04-24 MED ORDER — SODIUM CHLORIDE 0.9 % IV SOLN
Freq: Once | INTRAVENOUS | Status: AC
  Administered 2016-04-24: 11:00:00 via INTRAVENOUS
  Filled 2016-04-24: qty 1000

## 2016-04-24 MED ORDER — PACLITAXEL CHEMO INJECTION 300 MG/50ML
45.0000 mg/m2 | Freq: Once | INTRAVENOUS | Status: AC
  Administered 2016-04-24: 78 mg via INTRAVENOUS
  Filled 2016-04-24: qty 13

## 2016-04-24 MED ORDER — DIPHENHYDRAMINE HCL 50 MG/ML IJ SOLN
50.0000 mg | Freq: Once | INTRAMUSCULAR | Status: AC
  Administered 2016-04-24: 50 mg via INTRAVENOUS
  Filled 2016-04-24: qty 1

## 2016-04-24 MED ORDER — SODIUM CHLORIDE 0.9 % IV SOLN
20.0000 mg | Freq: Once | INTRAVENOUS | Status: AC
  Administered 2016-04-24: 20 mg via INTRAVENOUS
  Filled 2016-04-24: qty 2

## 2016-04-24 NOTE — Progress Notes (Signed)
[facility]-  Cancer Center  Clinic day:  04/24/2016  Chief Complaint: Gerald [name] is a 65 y.o. male with metastatic lung cancer and associated hypereosinophilia who is seen for assessment prior to week #2 carboplatin and Taxol with radiation.  HPI:  The patient was last seen in the medical oncology clinic on 04/17/2016.  At that time, he received week #1 carboplatin and Taxol.  He tolerated his chemotherapy well.  He denied any nausea or vomiting.  PD-L1 testing returned high expression (> = 50%).  He is a good candidate for Keytruda.  Symptomatically, he notes fatigue.  He describes increased volume of hemoptysis (3 tablespoons/day of dark red blood).  He notes right knee pain.  He is eating about 75% normal.  He has gained a pound.   Past Medical History  Diagnosis Date  . Arthritis     Rheumatoid arthritis  . Pneumothorax     spontaneous  . Renal disorder     as a child  . Mass of lung   . Collagen vascular disease (HCC)     RA    Past Surgical History  Procedure Laterality Date  . Ureter repair as a child    . Flexible bronchoscopy N/A 03/30/2016    Procedure: FLEXIBLE BRONCHOSCOPY;  Surgeon: Vishal Mungal, MD;  Location: [facility] ORS;  Service: Cardiopulmonary;  Laterality: N/A;    Family History  Problem Relation Age of Onset  . Brain cancer Mother   . Lung cancer Maternal Uncle   . Cancer Sister     Metastatic cancer- unknown primary    Social History:  reports that he quit smoking about 15 years ago. His smoking use included Cigarettes. He has a 13 pack-year smoking history. He does not have any smokeless tobacco history on file. He reports that he drinks alcohol. He reports that he does not use illicit drugs.  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  He will be getting a refrigerator soon.  He lives in [place].  Contact number is [phone].  The patient is accompanied by his daughter  today.  Allergies:  Allergies  Allergen Reactions  . Nsaids Other (See Comments)    Pt is unable to take this class of medications.    . Sulfa Antibiotics Other (See Comments)    Reaction:  GI upset     Current Medications: Current Outpatient Prescriptions  Medication Sig Dispense Refill  . atorvastatin (LIPITOR) 40 MG tablet Take 1 tablet (40 mg total) by mouth daily. 30 tablet 0  . gabapentin (NEURONTIN) 300 MG capsule Take 1 capsule (300 mg total) by mouth at bedtime. 30 capsule 0  . HYDROcodone-acetaminophen (NORCO) 5-325 MG tablet Take 1 tablet by mouth every 6 (six) hours as needed for moderate pain. 30 tablet 0  . ondansetron (ZOFRAN) 8 MG tablet Take 1 tablet (8 mg total) by mouth 2 (two) times daily as needed for nausea or vomiting. (Patient not taking: Reported on 04/24/2016) 20 tablet 0   No current facility-administered medications for this visit.    Review of Systems:  GENERAL:  Fatigue.  No fevers or sweats.  Weight up 1 pound. PERFORMANCE STATUS (ECOG):  1 HEENT:  Pollen allergy.  No visual changes, sore throat, mouth sores or tenderness. Lungs:  Shortness of breath on exertion.  Cough.  Hemoptysis (dark red, about 3 tbs/day). Cardiac:  No chest pain, palpitations, orthopnea, or PND.  Sleeps in   a recliner. GI:  Appetite 75%.  No nausea, vomiting, diarrhea, constipation, melena or hematochezia. GU:  No urgency, frequency, dysuria, or hematuria. Musculoskeletal:  Pain in right knee.  No back pain.  No muscle tenderness. Extremities:  No pain or swelling. Skin:  No rashes or skin changes. Neuro:  Little numbness bottom of left foot, improved on Neurontin.  No headache, weakness, balance or coordination issues. Endocrine:  No diabetes, thyroid issues, hot flashes or night sweats. Psych:  No mood changes, depression or anxiety. Pain:  Right knee pain.  Right sided chest discomfort.  Review of systems:  All other systems reviewed and found to be negative.  Physical  Exam: Blood pressure 106/68, pulse 102, temperature 95 F (35 C), temperature source Tympanic, resp. rate 19, height 5' 11" (1.803 m), weight 133 lb 4.3 oz (60.45 kg), SpO2 99 %. GENERAL:  Thin gentleman sitting comfortably in the exam room eating peanut butter crackers in no acute distress. MENTAL STATUS:  Alert and oriented to person, place and time. HEAD:  Wearing a [employer] camo cap with sunglasses.  Long gray hair in pony tail.  Goatee.  Temporal wasting.  Normocephalic, atraumatic, face symmetric, no Cushingoid features. EYES:  Blue eyes.  Pupils equal round and reactive to light and accomodation.  No conjunctivitis or scleral icterus. ENT:  Oropharynx clear without lesion.  Tongue normal. Mucous membranes moist.  RESPIRATORY:  Clear to auscultation without rales, wheezes or rhonchi. CARDIOVASCULAR:  Regular rate and rhythm without murmur, rub or gallop. ABDOMEN:  Soft, non-tender, with active bowel sounds, and no hepatosplenomegaly.  No masses. SKIN:  No rashes, ulcers or lesions. EXTREMITIES: No edema, no skin discoloration or tenderness.  No palpable cords. LYMPH NODES: No palpable cervical, supraclavicular, axillary or inguinal adenopathy  NEUROLOGICAL: Unremarkable. PSYCH:  Appropriate.    Appointment on 04/24/2016  Component Date Value Ref Range Status  . WBC 04/24/2016 37.4* 3.8 - 10.6 K/uL Final  . RBC 04/24/2016 4.48  4.40 - 5.90 MIL/uL Final  . Hemoglobin 04/24/2016 13.3  13.0 - 18.0 g/dL Final  . HCT 04/24/2016 39.8* 40.0 - 52.0 % Final  . MCV 04/24/2016 88.9  80.0 - 100.0 fL Final  . MCH 04/24/2016 29.7  26.0 - 34.0 pg Final  . MCHC 04/24/2016 33.5  32.0 - 36.0 g/dL Final  . RDW 04/24/2016 14.9* 11.5 - 14.5 % Final  . Platelets 04/24/2016 172  150 - 440 K/uL Final  . Neutrophils Relative % 04/24/2016 41%   Final  . Neutro Abs 04/24/2016 15.3* 1.4 - 6.5 K/uL Final  . Lymphocytes Relative 04/24/2016 3%   Final  . Lymphs Abs 04/24/2016 0.9* 1.0 - 3.6 K/uL Final  . Monocytes  Relative 04/24/2016 4%   Final  . Monocytes Absolute 04/24/2016 1.5* 0.2 - 1.0 K/uL Final  . Eosinophils Relative 04/24/2016 52%   Final  . Eosinophils Absolute 04/24/2016 19.5* 0 - 0.7 K/uL Final   RESULT REPEATED AND VERIFIED  . Basophils Relative 04/24/2016 0%   Final  . Basophils Absolute 04/24/2016 0.1  0 - 0.1 K/uL Final  . Sodium 04/24/2016 134* 135 - 145 mmol/L Final  . Potassium 04/24/2016 3.9  3.5 - 5.1 mmol/L Final  . Chloride 04/24/2016 102  101 - 111 mmol/L Final  . CO2 04/24/2016 26  22 - 32 mmol/L Final  . Glucose, Bld 04/24/2016 101* 65 - 99 mg/dL Final  . BUN 04/24/2016 18  6 - 20 mg/dL Final  . Creatinine, Ser 04/24/2016 0.76  0.61 -   1.24 mg/dL Final  . Calcium 04/24/2016 8.7* 8.9 - 10.3 mg/dL Final  . Total Protein 04/24/2016 7.7  6.5 - 8.1 g/dL Final  . Albumin 04/24/2016 3.2* 3.5 - 5.0 g/dL Final  . AST 04/24/2016 27  15 - 41 U/L Final  . ALT 04/24/2016 39  17 - 63 U/L Final  . Alkaline Phosphatase 04/24/2016 78  38 - 126 U/L Final  . Total Bilirubin 04/24/2016 0.8  0.3 - 1.2 mg/dL Final  . GFR calc non Af Amer 04/24/2016 >60  >60 mL/min Final  . GFR calc Af Amer 04/24/2016 >60  >60 mL/min Final   Comment: (NOTE) The eGFR has been calculated using the CKD EPI equation. This calculation has not been validated in all clinical situations. eGFR's persistently <60 mL/min signify possible Chronic Kidney Disease.   . Anion gap 04/24/2016 6  5 - 15 Final  . Magnesium 04/24/2016 1.8  1.7 - 2.4 mg/dL Final    Assessment:  Gerald [name] is a 65 y.o. male with stage IV non-small cell lung cancer and associated leukocytosis with eosinophilia. He presented with a 30 pound weight loss, shortness of breath, cough, and left leg discomfort. He has a 13-15 pack year smoking history.  Chest CT angiogram on 03/27/2016 revealed an 8 cm mass in the medial right upper lobe. The mass abutted the mediastinum and possibly invaded the left atrium. There was associated widespread  thoracic adenopathy. There was a 2.6 cm enhancing lesion in the medial segment of the left hepatic lobe.  Bronchoscopy on 03/30/2016 revealed an endobronchial lesion in the RUL anterior segment with 95% occlusion. There was extrinsic compression in the right middle lobe secondary to posterior mass effect. Pathology confirmed non-small cell lung cancer, favor adenocarcinoma.  PD-L1 testing revealed high expression (> 50%).  CEA was 2.3 on 03/27/2016.  PET scan on 04/05/2016 revealed intense hypermetabolism (SUV 17.95) associated with a 7.5 cm central perihilar right lung lesion. There was mediastinal (sub-carinal node SUV 8.89; 2.6 cm right paratracheal node SUV 17.49) and upper abdominal retroperitoneal hypermetabolic adenopathy (9 mm aortocaval node SUV 9.2). There were 2 liver metastasis (2.3 cm left lobe SUV 6.19; 2.2 cm segment VIII SUV 5.18). There are multifocal bone metastasis (C2 vertebral body SUV 9.0; 1.6 cm left sacral ala lesion SUV of 8.6).  Head MRI on 03/31/2016 was indeterminate for early metastatic disease to the brain. There was a small focus of gyral enhancement in the anterior right frontal lobe likely is post ischemic, while a small posterior right cerebellar enhancing lesion was more suspicious for small brain metastasis. There were multiple small primarily subacute appearing infarcts in the bilateral MCA and left PICA territories. There was no associated hemorrhage or mass effect. There was osseous metastatic disease in the cervical spine.   Bone scan on 04/07/2016 revealed a subtle distal right femur lesion which might be a small metastasis.  Bone scan is felt to be an unreliable modality for evaluation of osseous metastatic disease as the bone metastases seen on the recent PET and MRI were not evident.  He has marked leukocytosis with hypereosinophilia. Initial WBC was 72,300 with 53% eosinophils. Bone marrow on 03/29/2016 revealed marked increase in marrow eosinophils  (approximate 30%). There was no diagnostic morphologic evidence of a myeloproliferative or lymphoid neoplasm. Flow cytometry revealed significant increase in eosinophils (54%). There was relative decreased myeloid cells with no significant immunophenotypic abnormalities or increase in blasts. There was no lymphoid abnormalities or evidence of clonality. FISH studies were negative  for myeloproliferative neoplasms with eosinophilia (PDGFRA, PDGFRB, FGFR1). BCR-ABL was negative on 03/28/2016.  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 Left lower extremity duplex on 03/25/2016 revealed no evidence of DVT. Discomfort has improved on Neurontin 300 mg a day.  He began radiation on 04/11/2016.  He began weekly carboplatin and Taxol on 04/17/2016.  Symptomatically, he has ongoing hemoptysis. he has right knee pain.  He has gained a pound.  Exam is stable.  Code status is DNR/DNI.  Plan: 1.  Labs today:  CBC with diff, CMP, Mg. 2.  Week #2 carboplatin and Taxol. 3.  Continue radiation. 4.  Discuss PD-L1 testing. Discuss use of Keytruda (pemboolizumab) after completion of concurrent chemo/XRT. 5.  Phone follow-up with Dr. Oren Bracket regarding XRT and Beryle Flock. 6.  Preauth Keytruda.   Information provided. 7.  Anticipate repeat head MRI between 04/30/2016 - 05/31/2016. 8.  Encourage caloric intake. 9.  RTC in 1 week for MD assessment, labs (CBC with diff, CMP, Mg), and week #3 carboplatin and Taxol.   Lequita Asal, MD  04/24/2016, 9:35 AM

## 2016-04-24 NOTE — Patient Instructions (Signed)
Pembrolizumab injection What is this medicine? PEMBROLIZUMAB (pem broe liz ue mab) is a monoclonal antibody. It is used to treat melanoma and non-small cell lung cancer. This medicine may be used for other purposes; ask your health care provider or pharmacist if you have questions. What should I tell my health care provider before I take this medicine? They need to know if you have any of these conditions: -diabetes -immune system problems -inflammatory bowel disease -liver disease -lung or breathing disease -lupus -an unusual or allergic reaction to pembrolizumab, other medicines, foods, dyes, or preservatives -pregnant or trying to get pregnant -breast-feeding How should I use this medicine? This medicine is for infusion into a vein. It is given by a health care professional in a hospital or clinic setting. A special MedGuide will be given to you before each treatment. Be sure to read this information carefully each time. Talk to your pediatrician regarding the use of this medicine in children. Special care may be needed. Overdosage: If you think you have taken too much of this medicine contact a poison control center or emergency room at once. NOTE: This medicine is only for you. Do not share this medicine with others. What if I miss a dose? It is important not to miss your dose. Call your doctor or health care professional if you are unable to keep an appointment. What may interact with this medicine? Interactions have not been studied. Give your health care provider a list of all the medicines, herbs, non-prescription drugs, or dietary supplements you use. Also tell them if you smoke, drink alcohol, or use illegal drugs. Some items may interact with your medicine. This list may not describe all possible interactions. Give your health care provider a list of all the medicines, herbs, non-prescription drugs, or dietary supplements you use. Also tell them if you smoke, drink alcohol, or  use illegal drugs. Some items may interact with your medicine. What should I watch for while using this medicine? Your condition will be monitored carefully while you are receiving this medicine. You may need blood work done while you are taking this medicine. Do not become pregnant while taking this medicine or for 4 months after stopping it. Women should inform their doctor if they wish to become pregnant or think they might be pregnant. There is a potential for serious side effects to an unborn child. Talk to your health care professional or pharmacist for more information. Do not breast-feed an infant while taking this medicine or for 4 months after the last dose. What side effects may I notice from receiving this medicine? Side effects that you should report to your doctor or health care professional as soon as possible: -allergic reactions like skin rash, itching or hives, swelling of the face, lips, or tongue -bloody or black, tarry stools -breathing problems -change in the amount of urine -changes in vision -chest pain -chills -dark urine -dizziness or feeling faint or lightheaded -fast or irregular heartbeat -fever -flushing -hair loss -muscle pain -muscle weakness -persistent headache -signs and symptoms of high blood sugar such as dizziness; dry mouth; dry skin; fruity breath; nausea; stomach pain; increased hunger or thirst; increased urination -signs and symptoms of liver injury like dark urine, light-colored stools, loss of appetite, nausea, right upper belly pain, yellowing of the eyes or skin -stomach pain -weight loss Side effects that usually do not require medical attention (Report these to your doctor or health care professional if they continue or are bothersome.):constipation -cough -diarrhea -joint pain -  tiredness This list may not describe all possible side effects. Call your doctor for medical advice about side effects. You may report side effects to FDA at  1-800-FDA-1088. Where should I keep my medicine? This drug is given in a hospital or clinic and will not be stored at home. NOTE: This sheet is a summary. It may not cover all possible information. If you have questions about this medicine, talk to your doctor, pharmacist, or health care provider.    2016, [employer]. (2015-01-26 17:24:19)

## 2016-04-24 NOTE — Progress Notes (Signed)
Pt reports he is still coughing up dark red blood moderate amount.  Pt reports right knee and back of knee are hurting more cramping and in joint.  Pt reports he is having some fatigue.  Gets up and gets ready and is tired

## 2016-04-25 ENCOUNTER — Ambulatory Visit: Payer: Medicare Other

## 2016-04-25 ENCOUNTER — Ambulatory Visit
Admission: RE | Admit: 2016-04-25 | Discharge: 2016-04-25 | Disposition: A | Discharge status: Home / Self Care | Payer: Medicare Other | Source: Ambulatory Visit | Attending: Radiation Oncology | Admitting: Radiation Oncology

## 2016-04-25 DIAGNOSIS — C3411 Malignant neoplasm of upper lobe, right bronchus or lung: Secondary | ICD-10-CM | POA: Diagnosis not present

## 2016-04-25 DIAGNOSIS — Z510 Encounter for antineoplastic radiation therapy: Secondary | ICD-10-CM | POA: Diagnosis not present

## 2016-04-26 ENCOUNTER — Ambulatory Visit
Admission: RE | Admit: 2016-04-26 | Discharge: 2016-04-26 | Disposition: A | Discharge status: Home / Self Care | Payer: Medicare Other | Source: Ambulatory Visit | Attending: Radiation Oncology | Admitting: Radiation Oncology

## 2016-04-26 ENCOUNTER — Ambulatory Visit: Payer: Medicare Other

## 2016-04-26 DIAGNOSIS — C3411 Malignant neoplasm of upper lobe, right bronchus or lung: Secondary | ICD-10-CM | POA: Diagnosis not present

## 2016-04-26 DIAGNOSIS — Z510 Encounter for antineoplastic radiation therapy: Secondary | ICD-10-CM | POA: Diagnosis not present

## 2016-04-27 ENCOUNTER — Ambulatory Visit
Admission: RE | Admit: 2016-04-27 | Discharge: 2016-04-27 | Disposition: A | Discharge status: Home / Self Care | Payer: Medicare Other | Source: Ambulatory Visit | Attending: Radiation Oncology | Admitting: Radiation Oncology

## 2016-04-27 ENCOUNTER — Ambulatory Visit: Payer: Medicare Other

## 2016-04-27 DIAGNOSIS — Z510 Encounter for antineoplastic radiation therapy: Secondary | ICD-10-CM | POA: Diagnosis not present

## 2016-04-27 DIAGNOSIS — C3411 Malignant neoplasm of upper lobe, right bronchus or lung: Secondary | ICD-10-CM | POA: Diagnosis not present

## 2016-04-28 ENCOUNTER — Ambulatory Visit
Admission: RE | Admit: 2016-04-28 | Discharge: 2016-04-28 | Disposition: A | Discharge status: Home / Self Care | Payer: Medicare Other | Source: Ambulatory Visit | Attending: Radiation Oncology | Admitting: Radiation Oncology

## 2016-04-28 ENCOUNTER — Ambulatory Visit: Payer: Medicare Other

## 2016-04-28 DIAGNOSIS — C3411 Malignant neoplasm of upper lobe, right bronchus or lung: Secondary | ICD-10-CM | POA: Diagnosis not present

## 2016-04-28 DIAGNOSIS — Z510 Encounter for antineoplastic radiation therapy: Secondary | ICD-10-CM | POA: Diagnosis not present

## 2016-04-30 ENCOUNTER — Other Ambulatory Visit: Payer: Self-pay | Admitting: Hematology and Oncology

## 2016-05-01 ENCOUNTER — Ambulatory Visit: Payer: Medicare Other

## 2016-05-01 ENCOUNTER — Inpatient Hospital Stay: Payer: Medicare Other

## 2016-05-01 ENCOUNTER — Inpatient Hospital Stay (HOSPITAL_BASED_OUTPATIENT_CLINIC_OR_DEPARTMENT_OTHER): Payer: Medicare Other | Admitting: Hematology and Oncology

## 2016-05-01 ENCOUNTER — Ambulatory Visit
Admission: RE | Admit: 2016-05-01 | Discharge: 2016-05-01 | Disposition: A | Discharge status: Home / Self Care | Payer: Medicare Other | Source: Ambulatory Visit | Attending: Radiation Oncology | Admitting: Radiation Oncology

## 2016-05-01 ENCOUNTER — Encounter: Payer: Self-pay | Admitting: Hematology and Oncology

## 2016-05-01 VITALS — BP 113/67 | HR 88 | Temp 97.5°F | Resp 18 | Wt 132.7 lb

## 2016-05-01 DIAGNOSIS — C7951 Secondary malignant neoplasm of bone: Secondary | ICD-10-CM

## 2016-05-01 DIAGNOSIS — Z808 Family history of malignant neoplasm of other organs or systems: Secondary | ICD-10-CM

## 2016-05-01 DIAGNOSIS — R042 Hemoptysis: Secondary | ICD-10-CM

## 2016-05-01 DIAGNOSIS — C3411 Malignant neoplasm of upper lobe, right bronchus or lung: Secondary | ICD-10-CM | POA: Diagnosis not present

## 2016-05-01 DIAGNOSIS — R079 Chest pain, unspecified: Secondary | ICD-10-CM

## 2016-05-01 DIAGNOSIS — Z5111 Encounter for antineoplastic chemotherapy: Secondary | ICD-10-CM | POA: Diagnosis not present

## 2016-05-01 DIAGNOSIS — D721 Eosinophilia, unspecified: Secondary | ICD-10-CM

## 2016-05-01 DIAGNOSIS — C787 Secondary malignant neoplasm of liver and intrahepatic bile duct: Secondary | ICD-10-CM

## 2016-05-01 DIAGNOSIS — R1310 Dysphagia, unspecified: Secondary | ICD-10-CM

## 2016-05-01 DIAGNOSIS — Z87891 Personal history of nicotine dependence: Secondary | ICD-10-CM

## 2016-05-01 DIAGNOSIS — Z510 Encounter for antineoplastic radiation therapy: Secondary | ICD-10-CM | POA: Diagnosis not present

## 2016-05-01 DIAGNOSIS — Z66 Do not resuscitate: Secondary | ICD-10-CM

## 2016-05-01 DIAGNOSIS — D72829 Elevated white blood cell count, unspecified: Secondary | ICD-10-CM

## 2016-05-01 DIAGNOSIS — R599 Enlarged lymph nodes, unspecified: Secondary | ICD-10-CM

## 2016-05-01 DIAGNOSIS — Z801 Family history of malignant neoplasm of trachea, bronchus and lung: Secondary | ICD-10-CM

## 2016-05-01 DIAGNOSIS — R5383 Other fatigue: Secondary | ICD-10-CM

## 2016-05-01 DIAGNOSIS — C3491 Malignant neoplasm of unspecified part of right bronchus or lung: Secondary | ICD-10-CM | POA: Diagnosis not present

## 2016-05-01 DIAGNOSIS — M069 Rheumatoid arthritis, unspecified: Secondary | ICD-10-CM

## 2016-05-01 DIAGNOSIS — Z79899 Other long term (current) drug therapy: Secondary | ICD-10-CM

## 2016-05-01 DIAGNOSIS — M79672 Pain in left foot: Secondary | ICD-10-CM

## 2016-05-01 DIAGNOSIS — G629 Polyneuropathy, unspecified: Secondary | ICD-10-CM

## 2016-05-01 DIAGNOSIS — M25561 Pain in right knee: Secondary | ICD-10-CM

## 2016-05-01 DIAGNOSIS — Z923 Personal history of irradiation: Secondary | ICD-10-CM

## 2016-05-01 DIAGNOSIS — I999 Unspecified disorder of circulatory system: Secondary | ICD-10-CM

## 2016-05-01 LAB — CBC WITH DIFFERENTIAL/PLATELET
Basophils Absolute: 0 10*3/uL (ref 0–0.1)
Basophils Relative: 0 %
Eosinophils Absolute: 3.7 10*3/uL — ABNORMAL HIGH (ref 0–0.7)
Eosinophils Relative: 28 %
HCT: 38.2 % — ABNORMAL LOW (ref 40.0–52.0)
Hemoglobin: 12.9 g/dL — ABNORMAL LOW (ref 13.0–18.0)
Lymphocytes Relative: 5 %
Lymphs Abs: 0.7 10*3/uL — ABNORMAL LOW (ref 1.0–3.6)
MCH: 30.1 pg (ref 26.0–34.0)
MCHC: 33.7 g/dL (ref 32.0–36.0)
MCV: 89.2 fL (ref 80.0–100.0)
Monocytes Absolute: 1.2 10*3/uL — ABNORMAL HIGH (ref 0.2–1.0)
Monocytes Relative: 9 %
Neutro Abs: 7.4 10*3/uL — ABNORMAL HIGH (ref 1.4–6.5)
Neutrophils Relative %: 58 %
Platelets: 193 10*3/uL (ref 150–440)
RBC: 4.29 MIL/uL — ABNORMAL LOW (ref 4.40–5.90)
RDW: 14.4 % (ref 11.5–14.5)
WBC: 13.1 10*3/uL — ABNORMAL HIGH (ref 3.8–10.6)

## 2016-05-01 LAB — COMPREHENSIVE METABOLIC PANEL
ALT: 36 U/L (ref 17–63)
AST: 29 U/L (ref 15–41)
Albumin: 3 g/dL — ABNORMAL LOW (ref 3.5–5.0)
Alkaline Phosphatase: 81 U/L (ref 38–126)
Anion gap: 6 (ref 5–15)
BUN: 13 mg/dL (ref 6–20)
CO2: 26 mmol/L (ref 22–32)
Calcium: 8.5 mg/dL — ABNORMAL LOW (ref 8.9–10.3)
Chloride: 101 mmol/L (ref 101–111)
Creatinine, Ser: 0.74 mg/dL (ref 0.61–1.24)
GFR calc Af Amer: >60 mL/min (ref >60)
GFR calc non Af Amer: >60 mL/min (ref >60)
Glucose, Bld: 112 mg/dL — ABNORMAL HIGH (ref 65–99)
Potassium: 3.6 mmol/L (ref 3.5–5.1)
Sodium: 133 mmol/L — ABNORMAL LOW (ref 135–145)
Total Bilirubin: 0.6 mg/dL (ref 0.3–1.2)
Total Protein: 6.8 g/dL (ref 6.5–8.1)

## 2016-05-01 LAB — MAGNESIUM: Magnesium: 1.7 mg/dL (ref 1.7–2.4)

## 2016-05-01 MED ORDER — HEPARIN SOD (PORK) LOCK FLUSH 100 UNIT/ML IV SOLN
500.0000 [IU] | Freq: Once | INTRAVENOUS | Status: DC | PRN
  Filled 2016-05-01: qty 5

## 2016-05-01 MED ORDER — PALONOSETRON HCL INJECTION 0.25 MG/5ML
0.2500 mg | Freq: Once | INTRAVENOUS | Status: AC
  Administered 2016-05-01: 0.25 mg via INTRAVENOUS
  Filled 2016-05-01: qty 5

## 2016-05-01 MED ORDER — SODIUM CHLORIDE 0.9 % IV SOLN
200.0000 mg | Freq: Once | INTRAVENOUS | Status: AC
  Administered 2016-05-01: 200 mg via INTRAVENOUS
  Filled 2016-05-01: qty 20

## 2016-05-01 MED ORDER — SODIUM CHLORIDE 0.9 % IV SOLN
Freq: Once | INTRAVENOUS | Status: AC
  Administered 2016-05-01: 12:00:00 via INTRAVENOUS
  Filled 2016-05-01: qty 1000

## 2016-05-01 MED ORDER — SODIUM CHLORIDE 0.9% FLUSH
10.0000 mL | INTRAVENOUS | Status: DC | PRN
  Filled 2016-05-01: qty 10

## 2016-05-01 MED ORDER — FAMOTIDINE IN NACL 20-0.9 MG/50ML-% IV SOLN
20.0000 mg | Freq: Once | INTRAVENOUS | Status: AC
  Administered 2016-05-01: 20 mg via INTRAVENOUS
  Filled 2016-05-01: qty 50

## 2016-05-01 MED ORDER — PACLITAXEL CHEMO INJECTION 300 MG/50ML
45.0000 mg/m2 | Freq: Once | INTRAVENOUS | Status: AC
  Administered 2016-05-01: 78 mg via INTRAVENOUS
  Filled 2016-05-01: qty 13

## 2016-05-01 MED ORDER — SODIUM CHLORIDE 0.9 % IV SOLN
20.0000 mg | Freq: Once | INTRAVENOUS | Status: AC
  Administered 2016-05-01: 20 mg via INTRAVENOUS
  Filled 2016-05-01: qty 2

## 2016-05-01 MED ORDER — DIPHENHYDRAMINE HCL 50 MG/ML IJ SOLN
50.0000 mg | Freq: Once | INTRAMUSCULAR | Status: AC
  Administered 2016-05-01: 50 mg via INTRAVENOUS
  Filled 2016-05-01: qty 1

## 2016-05-01 NOTE — Progress Notes (Signed)
States is having difficulty swallowing liquids and solids due to pain.

## 2016-05-01 NOTE — Progress Notes (Signed)
[facility] day:  05/01/2016  Chief Complaint: Gerald [name] is a 65 y.o. male with metastatic lung cancer and associated hypereosinophilia who is seen for assessment prior to week #3 carboplatin and Taxol with radiation.  HPI:  The patient was last seen in the medical oncology clinic on 04/24/2016.  At that time, he received week #2 carboplatin and Taxol.  Symptomatically, he noted fatigue.  He described increased volume of hemoptysis.  He was eating well and had gained a pound.  During the interim, he has felt "alright" and "nothing to brag about".  His left lower extremity neuropathy has improved.  He has shortness of breath with exertion.  He notes less hemoptysis.  He is beginning to notice swallowing issues (soreness) secondary to radiation.  He is eating soft foods.  He is drinking Boost.  Weight is down 1 pound (stable from 04/17/2016).   Past Medical History  Diagnosis Date  . Arthritis     Rheumatoid arthritis  . Pneumothorax     spontaneous  . Renal disorder     as a child  . Mass of lung   . Collagen vascular disease ([place])     RA    Past Surgical History  Procedure Laterality Date  . Ureter repair as a child    . Flexible bronchoscopy N/A 03/30/2016    Procedure: FLEXIBLE BRONCHOSCOPY;  Surgeon: Vilinda Boehringer, MD;  Location: [facility] ORS;  Service: Cardiopulmonary;  Laterality: N/A;    Family History  Problem Relation Age of Onset  . Brain cancer Mother   . Lung cancer Maternal Uncle   . Cancer Sister     Metastatic cancer- unknown primary    Social History:  reports that he quit smoking about 15 years ago. His smoking use included Cigarettes. He has a 13 pack-year smoking history. He does not have any smokeless tobacco history on file. He reports that he drinks alcohol. He reports that he does not use illicit drugs.  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  He will be  getting a refrigerator soon.  He lives in [place].  Contact number is [phone].  The patient is accompanied by his daughter today.  Allergies:  Allergies  Allergen Reactions  . Nsaids Other (See Comments)    Pt is unable to take this class of medications.    . Sulfa Antibiotics Other (See Comments)    Reaction:  GI upset     Current Medications: Current Outpatient Prescriptions  Medication Sig Dispense Refill  . atorvastatin (LIPITOR) 40 MG tablet Take 1 tablet (40 mg total) by mouth daily. 30 tablet 0  . gabapentin (NEURONTIN) 300 MG capsule Take 1 capsule (300 mg total) by mouth at bedtime. 30 capsule 0  . HYDROcodone-acetaminophen (NORCO) 5-325 MG tablet Take 1 tablet by mouth every 6 (six) hours as needed for moderate pain. 30 tablet 0  . ondansetron (ZOFRAN) 8 MG tablet Take 1 tablet (8 mg total) by mouth 2 (two) times daily as needed for nausea or vomiting. 20 tablet 0   No current facility-administered medications for this visit.    Review of Systems:  GENERAL:  Doing "alright".  No fevers or sweats.  Weight up and down by 1 pound. PERFORMANCE STATUS (ECOG):  1 HEENT: No visual changes, sore throat, mouth sores or tenderness. Lungs:  Shortness of breath on exertion.  Cough.  Hemoptysis (volume less).  Cardiac:  No chest pain, palpitations, orthopnea, or PND.  Sleeps in a recliner. GI:  Appetite 75%.  Swallowing issues (soreness).  No nausea, vomiting, diarrhea, constipation, melena or hematochezia. GU:  No urgency, frequency, dysuria, or hematuria. Musculoskeletal:  Pain in right knee.  No back pain.  No muscle tenderness. Extremities:  No pain or swelling. Skin:  No rashes or skin changes. Neuro:  Little numbness bottom of left foot, improved on Neurontin.  No headache, weakness, balance or coordination issues. Endocrine:  No diabetes, thyroid issues, hot flashes or night sweats. Psych:  No mood changes, depression or anxiety. Pain:  Right knee pain.  Right sided  chest discomfort.  Review of systems:  All other systems reviewed and found to be negative.  Physical Exam: Blood pressure 113/67, pulse 88, temperature 97.5 F (36.4 C), temperature source Oral, resp. rate 18, weight 132 lb 11.5 oz (60.2 kg). GENERAL:  Thin gentleman sitting comfortably in the exam room in no acute distress. MENTAL STATUS:  Alert and oriented to person, place and time. HEAD:  Wearing a [employer] camo cap with sunglasses.  Long gray hair in pony tail.  Gray beard.  Temporal wasting.  Normocephalic, atraumatic, face symmetric, no Cushingoid features. EYES:  Blue eyes.  Pupils equal round and reactive to light and accomodation.  No conjunctivitis or scleral icterus. ENT:  Oropharynx clear without lesion.  Tongue normal. Mucous membranes moist.  RESPIRATORY:  Clear to auscultation without rales, wheezes or rhonchi. CARDIOVASCULAR:  Regular rate and rhythm without murmur, rub or gallop. ABDOMEN:  Soft, non-tender, with active bowel sounds, and no hepatosplenomegaly.  No masses. SKIN:  No rashes, ulcers or lesions. EXTREMITIES: No edema, no skin discoloration or tenderness.  No palpable cords. LYMPH NODES: No palpable cervical, supraclavicular, axillary or inguinal adenopathy  NEUROLOGICAL: Unremarkable. PSYCH:  Appropriate.    Appointment on 05/01/2016  Component Date Value Ref Range Status  . WBC 05/01/2016 13.1* 3.8 - 10.6 K/uL Final  . RBC 05/01/2016 4.29* 4.40 - 5.90 MIL/uL Final  . Hemoglobin 05/01/2016 12.9* 13.0 - 18.0 g/dL Final  . HCT 05/01/2016 38.2* 40.0 - 52.0 % Final  . MCV 05/01/2016 89.2  80.0 - 100.0 fL Final  . MCH 05/01/2016 30.1  26.0 - 34.0 pg Final  . MCHC 05/01/2016 33.7  32.0 - 36.0 g/dL Final  . RDW 05/01/2016 14.4  11.5 - 14.5 % Final  . Platelets 05/01/2016 193  150 - 440 K/uL Final  . Neutrophils Relative % 05/01/2016 58%   Final  . Neutro Abs 05/01/2016 7.4* 1.4 - 6.5 K/uL Final  . Lymphocytes Relative 05/01/2016 5%   Final  . Lymphs Abs  05/01/2016 0.7* 1.0 - 3.6 K/uL Final  . Monocytes Relative 05/01/2016 9%   Final  . Monocytes Absolute 05/01/2016 1.2* 0.2 - 1.0 K/uL Final  . Eosinophils Relative 05/01/2016 28%   Final  . Eosinophils Absolute 05/01/2016 3.7* 0 - 0.7 K/uL Final   RESULT REPEATED AND VERIFIED  . Basophils Relative 05/01/2016 0%   Final  . Basophils Absolute 05/01/2016 0.0  0 - 0.1 K/uL Final  . Sodium 05/01/2016 133* 135 - 145 mmol/L Final  . Potassium 05/01/2016 3.6  3.5 - 5.1 mmol/L Final  . Chloride 05/01/2016 101  101 - 111 mmol/L Final  . CO2 05/01/2016 26  22 - 32 mmol/L Final  . Glucose, Bld 05/01/2016 112* 65 - 99 mg/dL Final  . BUN 05/01/2016 13  6 - 20 mg/dL Final  . Creatinine, Ser 05/01/2016  0.74  0.61 - 1.24 mg/dL Final  . Calcium 05/01/2016 8.5* 8.9 - 10.3 mg/dL Final  . Total Protein 05/01/2016 6.8  6.5 - 8.1 g/dL Final  . Albumin 05/01/2016 3.0* 3.5 - 5.0 g/dL Final  . AST 05/01/2016 29  15 - 41 U/L Final  . ALT 05/01/2016 36  17 - 63 U/L Final  . Alkaline Phosphatase 05/01/2016 81  38 - 126 U/L Final  . Total Bilirubin 05/01/2016 0.6  0.3 - 1.2 mg/dL Final  . GFR calc non Af Amer 05/01/2016 >60  >60 mL/min Final  . GFR calc Af Amer 05/01/2016 >60  >60 mL/min Final   Comment: (NOTE) The eGFR has been calculated using the CKD EPI equation. This calculation has not been validated in all clinical situations. eGFR's persistently <60 mL/min signify possible Chronic Kidney Disease.   . Anion gap 05/01/2016 6  5 - 15 Final  . Magnesium 05/01/2016 1.7  1.7 - 2.4 mg/dL Final    Assessment:  Marqueze Ramcharan is a 65 y.o. male with stage IV non-small cell lung cancer and associated leukocytosis with eosinophilia. He presented with a 30 pound weight loss, shortness of breath, cough, and left leg discomfort. He has a 13-15 pack year smoking history.  Chest CT angiogram on 03/27/2016 revealed an 8 cm mass in the medial right upper lobe. The mass abutted the mediastinum and possibly invaded the  left atrium. There was associated widespread thoracic adenopathy. There was a 2.6 cm enhancing lesion in the medial segment of the left hepatic lobe.  Bronchoscopy on 03/30/2016 revealed an endobronchial lesion in the RUL anterior segment with 95% occlusion. There was extrinsic compression in the right middle lobe secondary to posterior mass effect. Pathology confirmed non-small cell lung cancer, favor adenocarcinoma.  PD-L1 testing revealed high expression (> 50%).  CEA was 2.3 on 03/27/2016.  PET scan on 04/05/2016 revealed intense hypermetabolism (SUV 17.95) associated with a 7.5 cm central perihilar right lung lesion. There was mediastinal (sub-carinal node SUV 8.89; 2.6 cm right paratracheal node SUV 17.49) and upper abdominal retroperitoneal hypermetabolic adenopathy (9 mm aortocaval node SUV 9.2). There were 2 liver metastasis (2.3 cm left lobe SUV 6.19; 2.2 cm segment VIII SUV 5.18). There are multifocal bone metastasis (C2 vertebral body SUV 9.0; 1.6 cm left sacral ala lesion SUV of 8.6).  Head MRI on 03/31/2016 was indeterminate for early metastatic disease to the brain. There was a small focus of gyral enhancement in the anterior right frontal lobe likely is post ischemic, while a small posterior right cerebellar enhancing lesion was more suspicious for small brain metastasis. There were multiple small primarily subacute appearing infarcts in the bilateral MCA and left PICA territories. There was no associated hemorrhage or mass effect. There was osseous metastatic disease in the cervical spine.   Bone scan on 04/07/2016 revealed a subtle distal right femur lesion which might be a small metastasis.  Bone scan is felt to be an unreliable modality for evaluation of osseous metastatic disease as the bone metastases seen on the recent PET and MRI were not evident.  He has marked leukocytosis with hypereosinophilia. Initial WBC was 72,300 with 53% eosinophils. Bone marrow on 03/29/2016  revealed marked increase in marrow eosinophils (approximate 30%). There was no diagnostic morphologic evidence of a myeloproliferative or lymphoid neoplasm. Flow cytometry revealed significant increase in eosinophils (54%). There was relative decreased myeloid cells with no significant immunophenotypic abnormalities or increase in blasts. There was no lymphoid abnormalities or evidence of clonality.  FISH studies were negative for myeloproliferative neoplasms with eosinophilia (PDGFRA, PDGFRB, FGFR1). BCR-ABL was negative on 03/28/2016.  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 Left lower extremity duplex on 03/25/2016 revealed no evidence of DVT. Discomfort improved on Neurontin 300 mg a day and continues to improve with declining eosinophilia.  He began radiation on 04/11/2016.  He is s/p 2 weeks of carboplatin and Taxol (04/17/2016 - 04/24/2016).  Symptomatically, he has mild odynophagia secondary to radiation. Hemoptysis has improved.  Exam is stable.  Code status is DNR/DNI.  Plan: 1.  Labs today:  CBC with diff, CMP, Mg. 2.  Week #3 carboplatin and Taxol. 3.  Continue radiation.  Radiation scheduled to finish on 05/12/2016. 4.  Discuss patient's thoughts about Keytruda.  Patient would like to initiate after recovery from concurrent chemotherapy and radiation. 6.  Head MRI on 05/15/2016; re: follow-up CNS lesions infarcts versus metastatic disease. 7.  Samara Deist and Beryle Flock. 8.  RTC on 05/09/2016 for NP assessment, labs (CBC with diff, CMP), Xgeva, week #4 carboplatin and Taxol. 9.  RTC on 05/16/2016 for MD assessment, labs (CBC with diff, CMP, Mg), and review of head MRI.   Lequita Asal, MD  05/01/2016, 10:20 AM

## 2016-05-02 ENCOUNTER — Ambulatory Visit: Payer: Medicare Other

## 2016-05-02 ENCOUNTER — Other Ambulatory Visit: Payer: Self-pay | Admitting: *Deleted

## 2016-05-02 ENCOUNTER — Ambulatory Visit
Admission: RE | Admit: 2016-05-02 | Discharge: 2016-05-02 | Disposition: A | Discharge status: Home / Self Care | Payer: Medicare Other | Source: Ambulatory Visit | Attending: Radiation Oncology | Admitting: Radiation Oncology

## 2016-05-02 DIAGNOSIS — C3411 Malignant neoplasm of upper lobe, right bronchus or lung: Secondary | ICD-10-CM | POA: Diagnosis not present

## 2016-05-02 DIAGNOSIS — Z510 Encounter for antineoplastic radiation therapy: Secondary | ICD-10-CM | POA: Diagnosis not present

## 2016-05-02 MED ORDER — SUCRALFATE 1 G PO TABS: 1.0000 g | ORAL_TABLET | Freq: Three times a day (TID) | ORAL | Status: DC

## 2016-05-03 ENCOUNTER — Ambulatory Visit
Admission: RE | Admit: 2016-05-03 | Discharge: 2016-05-03 | Disposition: A | Discharge status: Home / Self Care | Payer: Medicare Other | Source: Ambulatory Visit | Attending: Radiation Oncology | Admitting: Radiation Oncology

## 2016-05-03 ENCOUNTER — Ambulatory Visit: Payer: Medicare Other

## 2016-05-03 DIAGNOSIS — C3411 Malignant neoplasm of upper lobe, right bronchus or lung: Secondary | ICD-10-CM | POA: Diagnosis not present

## 2016-05-03 DIAGNOSIS — Z510 Encounter for antineoplastic radiation therapy: Secondary | ICD-10-CM | POA: Diagnosis not present

## 2016-05-04 ENCOUNTER — Ambulatory Visit
Admission: RE | Admit: 2016-05-04 | Discharge: 2016-05-04 | Disposition: A | Discharge status: Home / Self Care | Payer: Medicare Other | Source: Ambulatory Visit | Attending: Radiation Oncology | Admitting: Radiation Oncology

## 2016-05-04 ENCOUNTER — Other Ambulatory Visit: Payer: Self-pay | Admitting: Hematology and Oncology

## 2016-05-04 ENCOUNTER — Ambulatory Visit: Payer: Medicare Other

## 2016-05-04 DIAGNOSIS — C3411 Malignant neoplasm of upper lobe, right bronchus or lung: Secondary | ICD-10-CM | POA: Diagnosis not present

## 2016-05-04 DIAGNOSIS — Z510 Encounter for antineoplastic radiation therapy: Secondary | ICD-10-CM | POA: Diagnosis not present

## 2016-05-05 ENCOUNTER — Ambulatory Visit: Payer: Medicare Other

## 2016-05-07 ENCOUNTER — Other Ambulatory Visit: Payer: Self-pay | Admitting: Hematology and Oncology

## 2016-05-09 ENCOUNTER — Telehealth: Payer: Self-pay | Admitting: *Deleted

## 2016-05-09 ENCOUNTER — Inpatient Hospital Stay: Payer: Medicare Other

## 2016-05-09 ENCOUNTER — Ambulatory Visit: Payer: Medicare Other

## 2016-05-09 ENCOUNTER — Inpatient Hospital Stay: Payer: Medicare Other | Admitting: Family Medicine

## 2016-05-09 NOTE — Telephone Encounter (Signed)
Called pt because I rcvd a message from scheduling that pt not coming this week.  States he will come back when he is stronger.  When I spoke  To pt on the phone he states that he did not go to radiation on last Friday and he has had a break and feels some better. He was nauseated, could not eat, no appetite. Swallowing problems.  I asked him if he thought he should come in for fluids and to be seen by NP. He states that since he has had 6 days of not having treatment he is starting to feel better. His plan to come to MRI 6/2 and skip this week with treatment and come next week if he is much better. I advised him that if he needs anything he can call us and we will be happy to see him and he is aware.

## 2016-05-10 ENCOUNTER — Ambulatory Visit: Payer: Medicare Other

## 2016-05-11 ENCOUNTER — Ambulatory Visit: Payer: Medicare Other

## 2016-05-12 ENCOUNTER — Other Ambulatory Visit: Payer: Self-pay | Admitting: *Deleted

## 2016-05-12 ENCOUNTER — Other Ambulatory Visit: Payer: Self-pay

## 2016-05-12 ENCOUNTER — Ambulatory Visit
Admission: RE | Admit: 2016-05-12 | Discharge: 2016-05-12 | Disposition: A | Discharge status: Home / Self Care | Payer: Medicare Other | Source: Ambulatory Visit | Attending: Radiation Oncology | Admitting: Radiation Oncology

## 2016-05-12 ENCOUNTER — Ambulatory Visit
Admission: RE | Admit: 2016-05-12 | Discharge: 2016-05-12 | Disposition: A | Discharge status: Home / Self Care | Payer: Medicare Other | Source: Ambulatory Visit | Attending: Hematology and Oncology | Admitting: Hematology and Oncology

## 2016-05-12 ENCOUNTER — Ambulatory Visit: Payer: Medicare Other

## 2016-05-12 DIAGNOSIS — C7951 Secondary malignant neoplasm of bone: Secondary | ICD-10-CM

## 2016-05-12 DIAGNOSIS — C3411 Malignant neoplasm of upper lobe, right bronchus or lung: Secondary | ICD-10-CM | POA: Diagnosis not present

## 2016-05-12 DIAGNOSIS — G939 Disorder of brain, unspecified: Secondary | ICD-10-CM | POA: Diagnosis not present

## 2016-05-12 DIAGNOSIS — R930 Abnormal findings on diagnostic imaging of skull and head, not elsewhere classified: Secondary | ICD-10-CM | POA: Insufficient documentation

## 2016-05-12 DIAGNOSIS — I639 Cerebral infarction, unspecified: Secondary | ICD-10-CM | POA: Diagnosis not present

## 2016-05-12 DIAGNOSIS — C3490 Malignant neoplasm of unspecified part of unspecified bronchus or lung: Secondary | ICD-10-CM | POA: Diagnosis not present

## 2016-05-12 MED ORDER — GADOBENATE DIMEGLUMINE 529 MG/ML IV SOLN
15.0000 mL | Freq: Once | INTRAVENOUS | Status: AC | PRN
  Administered 2016-05-12: 12 mL via INTRAVENOUS

## 2016-05-15 ENCOUNTER — Ambulatory Visit: Payer: Medicare Other

## 2016-05-16 ENCOUNTER — Ambulatory Visit
Admission: RE | Admit: 2016-05-16 | Discharge: 2016-05-16 | Disposition: A | Discharge status: Home / Self Care | Payer: Medicare Other | Source: Ambulatory Visit | Attending: Radiation Oncology | Admitting: Radiation Oncology

## 2016-05-16 ENCOUNTER — Inpatient Hospital Stay: Payer: Medicare Other | Attending: Hematology and Oncology

## 2016-05-16 ENCOUNTER — Inpatient Hospital Stay: Payer: Medicare Other | Admitting: Hematology and Oncology

## 2016-05-16 DIAGNOSIS — R042 Hemoptysis: Secondary | ICD-10-CM | POA: Insufficient documentation

## 2016-05-16 DIAGNOSIS — Z5111 Encounter for antineoplastic chemotherapy: Secondary | ICD-10-CM | POA: Diagnosis not present

## 2016-05-16 DIAGNOSIS — G629 Polyneuropathy, unspecified: Secondary | ICD-10-CM | POA: Diagnosis not present

## 2016-05-16 DIAGNOSIS — M069 Rheumatoid arthritis, unspecified: Secondary | ICD-10-CM | POA: Insufficient documentation

## 2016-05-16 DIAGNOSIS — C7951 Secondary malignant neoplasm of bone: Secondary | ICD-10-CM | POA: Diagnosis not present

## 2016-05-16 DIAGNOSIS — R630 Anorexia: Secondary | ICD-10-CM | POA: Diagnosis not present

## 2016-05-16 DIAGNOSIS — D721 Eosinophilia: Secondary | ICD-10-CM | POA: Insufficient documentation

## 2016-05-16 DIAGNOSIS — C3411 Malignant neoplasm of upper lobe, right bronchus or lung: Secondary | ICD-10-CM | POA: Diagnosis not present

## 2016-05-16 DIAGNOSIS — Z801 Family history of malignant neoplasm of trachea, bronchus and lung: Secondary | ICD-10-CM | POA: Insufficient documentation

## 2016-05-16 DIAGNOSIS — J939 Pneumothorax, unspecified: Secondary | ICD-10-CM | POA: Diagnosis not present

## 2016-05-16 DIAGNOSIS — Z87891 Personal history of nicotine dependence: Secondary | ICD-10-CM | POA: Diagnosis not present

## 2016-05-16 DIAGNOSIS — R590 Localized enlarged lymph nodes: Secondary | ICD-10-CM | POA: Diagnosis not present

## 2016-05-16 DIAGNOSIS — Z66 Do not resuscitate: Secondary | ICD-10-CM | POA: Diagnosis not present

## 2016-05-16 DIAGNOSIS — I639 Cerebral infarction, unspecified: Secondary | ICD-10-CM | POA: Insufficient documentation

## 2016-05-16 DIAGNOSIS — Z79899 Other long term (current) drug therapy: Secondary | ICD-10-CM | POA: Diagnosis not present

## 2016-05-16 DIAGNOSIS — Z923 Personal history of irradiation: Secondary | ICD-10-CM | POA: Insufficient documentation

## 2016-05-16 DIAGNOSIS — Z808 Family history of malignant neoplasm of other organs or systems: Secondary | ICD-10-CM | POA: Insufficient documentation

## 2016-05-16 DIAGNOSIS — Z510 Encounter for antineoplastic radiation therapy: Secondary | ICD-10-CM | POA: Diagnosis not present

## 2016-05-16 DIAGNOSIS — Z809 Family history of malignant neoplasm, unspecified: Secondary | ICD-10-CM | POA: Diagnosis not present

## 2016-05-16 LAB — CBC WITH DIFFERENTIAL/PLATELET
Basophils Absolute: 0 10*3/uL (ref 0–0.1)
Basophils Relative: 0 %
Eosinophils Absolute: 2.2 10*3/uL — ABNORMAL HIGH (ref 0–0.7)
Eosinophils Relative: 22 %
HCT: 35.9 % — ABNORMAL LOW (ref 40.0–52.0)
Hemoglobin: 12.1 g/dL — ABNORMAL LOW (ref 13.0–18.0)
Lymphocytes Relative: 9 %
Lymphs Abs: 0.9 10*3/uL — ABNORMAL LOW (ref 1.0–3.6)
MCH: 30.7 pg (ref 26.0–34.0)
MCHC: 33.8 g/dL (ref 32.0–36.0)
MCV: 90.9 fL (ref 80.0–100.0)
Monocytes Absolute: 1.9 10*3/uL — ABNORMAL HIGH (ref 0.2–1.0)
Monocytes Relative: 20 %
Neutro Abs: 4.8 10*3/uL (ref 1.4–6.5)
Neutrophils Relative %: 49 %
Platelets: 221 10*3/uL (ref 150–440)
RBC: 3.95 MIL/uL — ABNORMAL LOW (ref 4.40–5.90)
RDW: 16.5 % — ABNORMAL HIGH (ref 11.5–14.5)
WBC: 9.9 10*3/uL (ref 3.8–10.6)

## 2016-05-16 LAB — COMPREHENSIVE METABOLIC PANEL
ALT: 31 U/L (ref 17–63)
AST: 35 U/L (ref 15–41)
Albumin: 3.1 g/dL — ABNORMAL LOW (ref 3.5–5.0)
Alkaline Phosphatase: 81 U/L (ref 38–126)
Anion gap: 5 (ref 5–15)
BUN: 11 mg/dL (ref 6–20)
CO2: 27 mmol/L (ref 22–32)
Calcium: 8.8 mg/dL — ABNORMAL LOW (ref 8.9–10.3)
Chloride: 105 mmol/L (ref 101–111)
Creatinine, Ser: 0.71 mg/dL (ref 0.61–1.24)
GFR calc Af Amer: >60 mL/min (ref >60)
GFR calc non Af Amer: >60 mL/min (ref >60)
Glucose, Bld: 105 mg/dL — ABNORMAL HIGH (ref 65–99)
Potassium: 4.7 mmol/L (ref 3.5–5.1)
Sodium: 137 mmol/L (ref 135–145)
Total Bilirubin: 0.7 mg/dL (ref 0.3–1.2)
Total Protein: 6.9 g/dL (ref 6.5–8.1)

## 2016-05-16 LAB — MAGNESIUM: Magnesium: 1.9 mg/dL (ref 1.7–2.4)

## 2016-05-17 ENCOUNTER — Ambulatory Visit
Admission: RE | Admit: 2016-05-17 | Discharge: 2016-05-17 | Disposition: A | Discharge status: Home / Self Care | Payer: Medicare Other | Source: Ambulatory Visit | Attending: Radiation Oncology | Admitting: Radiation Oncology

## 2016-05-17 ENCOUNTER — Other Ambulatory Visit: Payer: Self-pay | Admitting: Hematology and Oncology

## 2016-05-17 DIAGNOSIS — C3491 Malignant neoplasm of unspecified part of right bronchus or lung: Secondary | ICD-10-CM | POA: Diagnosis not present

## 2016-05-17 DIAGNOSIS — C3411 Malignant neoplasm of upper lobe, right bronchus or lung: Secondary | ICD-10-CM | POA: Diagnosis not present

## 2016-05-17 DIAGNOSIS — Z510 Encounter for antineoplastic radiation therapy: Secondary | ICD-10-CM | POA: Diagnosis not present

## 2016-05-18 ENCOUNTER — Other Ambulatory Visit: Payer: Self-pay

## 2016-05-18 ENCOUNTER — Encounter: Payer: Self-pay | Admitting: Hematology and Oncology

## 2016-05-18 ENCOUNTER — Inpatient Hospital Stay (HOSPITAL_BASED_OUTPATIENT_CLINIC_OR_DEPARTMENT_OTHER): Payer: Medicare Other | Admitting: Hematology and Oncology

## 2016-05-18 ENCOUNTER — Ambulatory Visit: Payer: Medicare Other

## 2016-05-18 ENCOUNTER — Ambulatory Visit
Admission: RE | Admit: 2016-05-18 | Discharge: 2016-05-18 | Disposition: A | Discharge status: Home / Self Care | Payer: Medicare Other | Source: Ambulatory Visit | Attending: Radiation Oncology | Admitting: Radiation Oncology

## 2016-05-18 VITALS — BP 101/70 | HR 96 | Temp 96.7°F | Wt 130.2 lb

## 2016-05-18 DIAGNOSIS — D721 Eosinophilia: Secondary | ICD-10-CM | POA: Diagnosis not present

## 2016-05-18 DIAGNOSIS — E43 Unspecified severe protein-calorie malnutrition: Secondary | ICD-10-CM

## 2016-05-18 DIAGNOSIS — Z87891 Personal history of nicotine dependence: Secondary | ICD-10-CM

## 2016-05-18 DIAGNOSIS — J939 Pneumothorax, unspecified: Secondary | ICD-10-CM

## 2016-05-18 DIAGNOSIS — I639 Cerebral infarction, unspecified: Secondary | ICD-10-CM

## 2016-05-18 DIAGNOSIS — R630 Anorexia: Secondary | ICD-10-CM | POA: Diagnosis not present

## 2016-05-18 DIAGNOSIS — G629 Polyneuropathy, unspecified: Secondary | ICD-10-CM

## 2016-05-18 DIAGNOSIS — R042 Hemoptysis: Secondary | ICD-10-CM | POA: Diagnosis not present

## 2016-05-18 DIAGNOSIS — Z79899 Other long term (current) drug therapy: Secondary | ICD-10-CM

## 2016-05-18 DIAGNOSIS — Z923 Personal history of irradiation: Secondary | ICD-10-CM

## 2016-05-18 DIAGNOSIS — C3411 Malignant neoplasm of upper lobe, right bronchus or lung: Secondary | ICD-10-CM

## 2016-05-18 DIAGNOSIS — G6289 Other specified polyneuropathies: Secondary | ICD-10-CM

## 2016-05-18 DIAGNOSIS — Z809 Family history of malignant neoplasm, unspecified: Secondary | ICD-10-CM

## 2016-05-18 DIAGNOSIS — R590 Localized enlarged lymph nodes: Secondary | ICD-10-CM

## 2016-05-18 DIAGNOSIS — C7951 Secondary malignant neoplasm of bone: Secondary | ICD-10-CM | POA: Diagnosis not present

## 2016-05-18 DIAGNOSIS — Z801 Family history of malignant neoplasm of trachea, bronchus and lung: Secondary | ICD-10-CM

## 2016-05-18 DIAGNOSIS — Z5111 Encounter for antineoplastic chemotherapy: Secondary | ICD-10-CM | POA: Diagnosis not present

## 2016-05-18 DIAGNOSIS — G893 Neoplasm related pain (acute) (chronic): Secondary | ICD-10-CM

## 2016-05-18 DIAGNOSIS — Z808 Family history of malignant neoplasm of other organs or systems: Secondary | ICD-10-CM

## 2016-05-18 DIAGNOSIS — Z66 Do not resuscitate: Secondary | ICD-10-CM

## 2016-05-18 DIAGNOSIS — M069 Rheumatoid arthritis, unspecified: Secondary | ICD-10-CM

## 2016-05-18 MED ORDER — HYDROCODONE-ACETAMINOPHEN 7.5-325 MG PO TABS: 1.0000 | ORAL_TABLET | ORAL | Status: DC | PRN

## 2016-05-18 NOTE — Patient Instructions (Signed)
Pembrolizumab injection What is this medicine? PEMBROLIZUMAB (pem broe liz ue mab) is a monoclonal antibody. It is used to treat melanoma and non-small cell lung cancer. This medicine may be used for other purposes; ask your health care provider or pharmacist if you have questions. What should I tell my health care provider before I take this medicine? They need to know if you have any of these conditions: -diabetes -immune system problems -inflammatory bowel disease -liver disease -lung or breathing disease -lupus -an unusual or allergic reaction to pembrolizumab, other medicines, foods, dyes, or preservatives -pregnant or trying to get pregnant -breast-feeding How should I use this medicine? This medicine is for infusion into a vein. It is given by a health care professional in a hospital or clinic setting. A special MedGuide will be given to you before each treatment. Be sure to read this information carefully each time. Talk to your pediatrician regarding the use of this medicine in children. Special care may be needed. Overdosage: If you think you have taken too much of this medicine contact a poison control center or emergency room at once. NOTE: This medicine is only for you. Do not share this medicine with others. What if I miss a dose? It is important not to miss your dose. Call your doctor or health care professional if you are unable to keep an appointment. What may interact with this medicine? Interactions have not been studied. Give your health care provider a list of all the medicines, herbs, non-prescription drugs, or dietary supplements you use. Also tell them if you smoke, drink alcohol, or use illegal drugs. Some items may interact with your medicine. This list may not describe all possible interactions. Give your health care provider a list of all the medicines, herbs, non-prescription drugs, or dietary supplements you use. Also tell them if you smoke, drink alcohol, or  use illegal drugs. Some items may interact with your medicine. What should I watch for while using this medicine? Your condition will be monitored carefully while you are receiving this medicine. You may need blood work done while you are taking this medicine. Do not become pregnant while taking this medicine or for 4 months after stopping it. Women should inform their doctor if they wish to become pregnant or think they might be pregnant. There is a potential for serious side effects to an unborn child. Talk to your health care professional or pharmacist for more information. Do not breast-feed an infant while taking this medicine or for 4 months after the last dose. What side effects may I notice from receiving this medicine? Side effects that you should report to your doctor or health care professional as soon as possible: -allergic reactions like skin rash, itching or hives, swelling of the face, lips, or tongue -bloody or black, tarry stools -breathing problems -change in the amount of urine -changes in vision -chest pain -chills -dark urine -dizziness or feeling faint or lightheaded -fast or irregular heartbeat -fever -flushing -hair loss -muscle pain -muscle weakness -persistent headache -signs and symptoms of high blood sugar such as dizziness; dry mouth; dry skin; fruity breath; nausea; stomach pain; increased hunger or thirst; increased urination -signs and symptoms of liver injury like dark urine, light-colored stools, loss of appetite, nausea, right upper belly pain, yellowing of the eyes or skin -stomach pain -weight loss Side effects that usually do not require medical attention (Report these to your doctor or health care professional if they continue or are bothersome.):constipation -cough -diarrhea -joint pain -  tiredness This list may not describe all possible side effects. Call your doctor for medical advice about side effects. You may report side effects to FDA at  1-800-FDA-1088. Where should I keep my medicine? This drug is given in a hospital or clinic and will not be stored at home. NOTE: This sheet is a summary. It may not cover all possible information. If you have questions about this medicine, talk to your doctor, pharmacist, or health care provider.    2016, [employer]. (2015-01-26 17:24:19)

## 2016-05-19 ENCOUNTER — Ambulatory Visit: Payer: Medicare Other

## 2016-05-19 ENCOUNTER — Ambulatory Visit
Admission: RE | Admit: 2016-05-19 | Discharge: 2016-05-19 | Disposition: A | Discharge status: Home / Self Care | Payer: Medicare Other | Source: Ambulatory Visit | Attending: Radiation Oncology | Admitting: Radiation Oncology

## 2016-05-19 DIAGNOSIS — Z510 Encounter for antineoplastic radiation therapy: Secondary | ICD-10-CM | POA: Diagnosis not present

## 2016-05-19 DIAGNOSIS — C3411 Malignant neoplasm of upper lobe, right bronchus or lung: Secondary | ICD-10-CM | POA: Diagnosis not present

## 2016-05-22 ENCOUNTER — Inpatient Hospital Stay: Payer: Medicare Other

## 2016-05-22 ENCOUNTER — Other Ambulatory Visit: Payer: Self-pay | Admitting: Hematology and Oncology

## 2016-05-22 ENCOUNTER — Ambulatory Visit
Admission: RE | Admit: 2016-05-22 | Discharge: 2016-05-22 | Disposition: A | Discharge status: Home / Self Care | Payer: Medicare Other | Source: Ambulatory Visit | Attending: Radiation Oncology | Admitting: Radiation Oncology

## 2016-05-22 ENCOUNTER — Ambulatory Visit: Payer: Medicare Other

## 2016-05-22 ENCOUNTER — Inpatient Hospital Stay (HOSPITAL_BASED_OUTPATIENT_CLINIC_OR_DEPARTMENT_OTHER): Payer: Medicare Other | Admitting: Hematology and Oncology

## 2016-05-22 VITALS — BP 108/66 | HR 107 | Temp 95.4°F | Resp 19 | Ht 71.0 in | Wt 128.4 lb

## 2016-05-22 DIAGNOSIS — C3411 Malignant neoplasm of upper lobe, right bronchus or lung: Secondary | ICD-10-CM

## 2016-05-22 DIAGNOSIS — R590 Localized enlarged lymph nodes: Secondary | ICD-10-CM

## 2016-05-22 DIAGNOSIS — R630 Anorexia: Secondary | ICD-10-CM | POA: Diagnosis not present

## 2016-05-22 DIAGNOSIS — R042 Hemoptysis: Secondary | ICD-10-CM

## 2016-05-22 DIAGNOSIS — D721 Eosinophilia, unspecified: Secondary | ICD-10-CM

## 2016-05-22 DIAGNOSIS — Z66 Do not resuscitate: Secondary | ICD-10-CM

## 2016-05-22 DIAGNOSIS — I639 Cerebral infarction, unspecified: Secondary | ICD-10-CM

## 2016-05-22 DIAGNOSIS — C7951 Secondary malignant neoplasm of bone: Secondary | ICD-10-CM

## 2016-05-22 DIAGNOSIS — Z79899 Other long term (current) drug therapy: Secondary | ICD-10-CM

## 2016-05-22 DIAGNOSIS — I5189 Other ill-defined heart diseases: Secondary | ICD-10-CM

## 2016-05-22 DIAGNOSIS — Z801 Family history of malignant neoplasm of trachea, bronchus and lung: Secondary | ICD-10-CM

## 2016-05-22 DIAGNOSIS — Z809 Family history of malignant neoplasm, unspecified: Secondary | ICD-10-CM

## 2016-05-22 DIAGNOSIS — Z5111 Encounter for antineoplastic chemotherapy: Secondary | ICD-10-CM | POA: Diagnosis not present

## 2016-05-22 DIAGNOSIS — G629 Polyneuropathy, unspecified: Secondary | ICD-10-CM

## 2016-05-22 DIAGNOSIS — E43 Unspecified severe protein-calorie malnutrition: Secondary | ICD-10-CM

## 2016-05-22 DIAGNOSIS — J939 Pneumothorax, unspecified: Secondary | ICD-10-CM

## 2016-05-22 DIAGNOSIS — Z808 Family history of malignant neoplasm of other organs or systems: Secondary | ICD-10-CM

## 2016-05-22 DIAGNOSIS — Z923 Personal history of irradiation: Secondary | ICD-10-CM

## 2016-05-22 DIAGNOSIS — Z87891 Personal history of nicotine dependence: Secondary | ICD-10-CM

## 2016-05-22 DIAGNOSIS — Z510 Encounter for antineoplastic radiation therapy: Secondary | ICD-10-CM | POA: Diagnosis not present

## 2016-05-22 LAB — CBC WITH DIFFERENTIAL/PLATELET
Basophils Absolute: 0.1 10*3/uL (ref 0–0.1)
Basophils Relative: 1 %
Eosinophils Absolute: 2.9 10*3/uL — ABNORMAL HIGH (ref 0–0.7)
Eosinophils Relative: 25 %
HCT: 36.4 % — ABNORMAL LOW (ref 40.0–52.0)
Hemoglobin: 12.3 g/dL — ABNORMAL LOW (ref 13.0–18.0)
Lymphocytes Relative: 7 %
Lymphs Abs: 0.8 10*3/uL — ABNORMAL LOW (ref 1.0–3.6)
MCH: 31.1 pg (ref 26.0–34.0)
MCHC: 33.8 g/dL (ref 32.0–36.0)
MCV: 92 fL (ref 80.0–100.0)
Monocytes Absolute: 1.5 10*3/uL — ABNORMAL HIGH (ref 0.2–1.0)
Monocytes Relative: 13 %
Neutro Abs: 6.3 10*3/uL (ref 1.4–6.5)
Neutrophils Relative %: 54 %
Platelets: 212 10*3/uL (ref 150–440)
RBC: 3.96 MIL/uL — ABNORMAL LOW (ref 4.40–5.90)
RDW: 17.4 % — ABNORMAL HIGH (ref 11.5–14.5)
WBC: 11.6 10*3/uL — ABNORMAL HIGH (ref 3.8–10.6)

## 2016-05-22 LAB — COMPREHENSIVE METABOLIC PANEL
ALT: 33 U/L (ref 17–63)
AST: 40 U/L (ref 15–41)
Albumin: 3.1 g/dL — ABNORMAL LOW (ref 3.5–5.0)
Alkaline Phosphatase: 104 U/L (ref 38–126)
Anion gap: 5 (ref 5–15)
BUN: 12 mg/dL (ref 6–20)
CO2: 27 mmol/L (ref 22–32)
Calcium: 9.1 mg/dL (ref 8.9–10.3)
Chloride: 106 mmol/L (ref 101–111)
Creatinine, Ser: 0.84 mg/dL (ref 0.61–1.24)
GFR calc Af Amer: >60 mL/min (ref >60)
GFR calc non Af Amer: >60 mL/min (ref >60)
Glucose, Bld: 144 mg/dL — ABNORMAL HIGH (ref 65–99)
Potassium: 4 mmol/L (ref 3.5–5.1)
Sodium: 138 mmol/L (ref 135–145)
Total Bilirubin: 0.7 mg/dL (ref 0.3–1.2)
Total Protein: 7.7 g/dL (ref 6.5–8.1)

## 2016-05-22 LAB — MAGNESIUM: Magnesium: 1.9 mg/dL (ref 1.7–2.4)

## 2016-05-22 MED ORDER — FAMOTIDINE IN NACL 20-0.9 MG/50ML-% IV SOLN
20.0000 mg | Freq: Once | INTRAVENOUS | Status: AC
  Administered 2016-05-22: 20 mg via INTRAVENOUS
  Filled 2016-05-22: qty 50

## 2016-05-22 MED ORDER — DENOSUMAB 120 MG/1.7ML ~~LOC~~ SOLN
120.0000 mg | Freq: Once | SUBCUTANEOUS | Status: AC
  Administered 2016-05-22: 120 mg via SUBCUTANEOUS
  Filled 2016-05-22: qty 1.7

## 2016-05-22 MED ORDER — SODIUM CHLORIDE 0.9 % IV SOLN
199.6000 mg | Freq: Once | INTRAVENOUS | Status: AC
  Administered 2016-05-22: 200 mg via INTRAVENOUS
  Filled 2016-05-22: qty 20

## 2016-05-22 MED ORDER — PALONOSETRON HCL INJECTION 0.25 MG/5ML
0.2500 mg | Freq: Once | INTRAVENOUS | Status: AC
  Administered 2016-05-22: 0.25 mg via INTRAVENOUS
  Filled 2016-05-22: qty 5

## 2016-05-22 MED ORDER — DIPHENHYDRAMINE HCL 50 MG/ML IJ SOLN
50.0000 mg | Freq: Once | INTRAMUSCULAR | Status: AC
  Administered 2016-05-22: 50 mg via INTRAVENOUS
  Filled 2016-05-22: qty 1

## 2016-05-22 MED ORDER — SODIUM CHLORIDE 0.9 % IV SOLN
20.0000 mg | Freq: Once | INTRAVENOUS | Status: AC
  Administered 2016-05-22: 20 mg via INTRAVENOUS
  Filled 2016-05-22: qty 2

## 2016-05-22 MED ORDER — SODIUM CHLORIDE 0.9 % IV SOLN
Freq: Once | INTRAVENOUS | Status: AC
  Administered 2016-05-22: 10:00:00 via INTRAVENOUS
  Filled 2016-05-22: qty 1000

## 2016-05-22 MED ORDER — PACLITAXEL CHEMO INJECTION 300 MG/50ML
45.0000 mg/m2 | Freq: Once | INTRAVENOUS | Status: AC
  Administered 2016-05-22: 78 mg via INTRAVENOUS
  Filled 2016-05-22: qty 13

## 2016-05-22 MED ORDER — GABAPENTIN 300 MG PO CAPS: 300.0000 mg | ORAL_CAPSULE | Freq: Every day | ORAL | Status: DC

## 2016-05-22 NOTE — Progress Notes (Signed)
[facility] day:  05/22/2016  Chief Complaint: Gerald [name] is a 65 y.o. male with metastatic lung cancer and associated hypereosinophilia who is seen for assessment prior to week #4 carboplatin and Taxol with radiation.  HPI:  The patient was last seen in the medical oncology clinic on 05/18/2016.  At that time, he was on a break from radiation and chemotherapy.  Head MRI revealed evolving changes associated with infarcts.  There were a couple of new lesions (small infarcts or metastasis) for which follow-up was needed.  At last visit, we discussed his thoughts about therapy.  He wished to restart treatment this week.  Symptomatically, he notes pink tinged hemoptysis (improved).  He has numbness in his left great toe.  He is using his Neurontin (needs a refill).  Weight is down 2 pounds.  Radiation completes this week.   Past Medical History  Diagnosis Date  . Arthritis     Rheumatoid arthritis  . Pneumothorax     spontaneous  . Renal disorder     as a child  . Mass of lung   . Collagen vascular disease ([place])     RA    Past Surgical History  Procedure Laterality Date  . Ureter repair as a child    . Flexible bronchoscopy N/A 03/30/2016    Procedure: FLEXIBLE BRONCHOSCOPY;  Surgeon: Vilinda Boehringer, MD;  Location: [facility] ORS;  Service: Cardiopulmonary;  Laterality: N/A;    Family History  Problem Relation Age of Onset  . Brain cancer Mother   . Lung cancer Maternal Uncle   . Cancer Sister     Metastatic cancer- unknown primary    Social History:  reports that he quit smoking about 15 years ago. His smoking use included Cigarettes. He has a 13 pack-year smoking history. He does not have any smokeless tobacco history on file. He reports that he drinks alcohol. He reports that he does not use illicit drugs.  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  He will be getting a refrigerator  soon.  He lives in [place].  Contact number is [phone].  The patient is alone today.  Allergies:  Allergies  Allergen Reactions  . Nsaids Other (See Comments)    Pt is unable to take this class of medications.    . Sulfa Antibiotics Other (See Comments)    Reaction:  GI upset     Current Medications: Current Outpatient Prescriptions  Medication Sig Dispense Refill  . atorvastatin (LIPITOR) 40 MG tablet Take 1 tablet (40 mg total) by mouth daily. 30 tablet 0  . gabapentin (NEURONTIN) 300 MG capsule TAKE 1 CAPSULE (300 MG TOTAL) BY MOUTH AT BEDTIME. 30 capsule 0  . HYDROcodone-acetaminophen (NORCO) 7.5-325 MG tablet Take 1 tablet by mouth every 4 (four) hours as needed for moderate pain (Every 4-6 hours as needed for pain). 30 tablet 0  . ondansetron (ZOFRAN) 8 MG tablet Take 1 tablet (8 mg total) by mouth 2 (two) times daily as needed for nausea or vomiting. 20 tablet 0  . sucralfate (CARAFATE) 1 g tablet Take 1 tablet (1 g total) by mouth 3 (three) times daily. Dissolve in 2-3 tbsp warm water, swish and swallow. 90 tablet 3   No current facility-administered medications for this visit.    Review of Systems:  GENERAL:  Doing "ok".  No fevers or sweats.  Weight down 2 pounds. PERFORMANCE  STATUS (ECOG):  1 HEENT: No visual changes, sore throat, mouth sores or tenderness. Lungs:  Shortness of breath on exertion.  Cough.  Slight hemoptysis (light pink). Cardiac:  No chest pain, palpitations, orthopnea, or PND.  Sleeps in a recliner. GI:  Appetite fluctuates.  No nausea, vomiting, diarrhea, constipation, melena or hematochezia. GU:  No urgency, frequency, dysuria, or hematuria. Musculoskeletal:  Pain in right knee.  No back pain.  No muscle tenderness. Extremities:  No pain or swelling. Skin:  No rashes or skin changes. Neuro:  Little numbness in left great toe, improved on Neurontin.  No headache, weakness, balance or coordination issues. Endocrine:  No diabetes, thyroid  issues, hot flashes or night sweats. Psych:  No mood changes, depression or anxiety. Pain:  Right knee pain.  Right sided chest discomfort.  Review of systems:  All other systems reviewed and found to be negative.  Physical Exam: Blood pressure 108/66, pulse 107, temperature 95.4 F (35.2 C), temperature source Tympanic, resp. rate 19, height _0  (1.803 m), weight 128 lb 6.7 oz (58.25 kg), SpO2 98 %. GENERAL:  Thin gentleman sitting comfortably in the exam room in no acute distress. MENTAL STATUS:  Alert and oriented to person, place and time. HEAD:  Wearing a [employer] camo cap.  Long gray hair in pony tail.  Scruffy gray beard.  Temporal wasting.  Normocephalic, atraumatic, face symmetric, no Cushingoid features. EYES:  Blue eyes.  Pupils equal round and reactive to light and accomodation.  No conjunctivitis or scleral icterus. ENT:  Oropharynx clear without lesion.  Tongue normal. Mucous membranes moist.  RESPIRATORY:  Clear to auscultation without rales, wheezes or rhonchi. CARDIOVASCULAR:  Regular rate and rhythm without murmur, rub or gallop. ABDOMEN:  Soft, non-tender, with active bowel sounds, and no hepatosplenomegaly.  No masses. SKIN:  No rashes, ulcers or lesions. EXTREMITIES: No edema, no skin discoloration or tenderness.  No palpable cords. LYMPH NODES: No palpable cervical, supraclavicular, axillary or inguinal adenopathy  NEUROLOGICAL: Unremarkable. PSYCH:  Appropriate.    Appointment on 05/22/2016  Component Date Value Ref Range Status  . WBC 05/22/2016 11.6* 3.8 - 10.6 K/uL Final  . RBC 05/22/2016 3.96* 4.40 - 5.90 MIL/uL Final  . Hemoglobin 05/22/2016 12.3* 13.0 - 18.0 g/dL Final  . HCT 05/22/2016 36.4* 40.0 - 52.0 % Final  . MCV 05/22/2016 92.0  80.0 - 100.0 fL Final  . MCH 05/22/2016 31.1  26.0 - 34.0 pg Final  . MCHC 05/22/2016 33.8  32.0 - 36.0 g/dL Final  . RDW 05/22/2016 17.4* 11.5 - 14.5 % Final  . Platelets 05/22/2016 212  150 - 440 K/uL Final  . Neutrophils  Relative % 05/22/2016 54%   Final  . Neutro Abs 05/22/2016 6.3  1.4 - 6.5 K/uL Final  . Lymphocytes Relative 05/22/2016 7%   Final  . Lymphs Abs 05/22/2016 0.8* 1.0 - 3.6 K/uL Final  . Monocytes Relative 05/22/2016 13%   Final  . Monocytes Absolute 05/22/2016 1.5* 0.2 - 1.0 K/uL Final  . Eosinophils Relative 05/22/2016 25%   Final  . Eosinophils Absolute 05/22/2016 2.9* 0 - 0.7 K/uL Final   RESULT REPEATED AND VERIFIED  . Basophils Relative 05/22/2016 1%   Final  . Basophils Absolute 05/22/2016 0.1  0 - 0.1 K/uL Final  . Sodium 05/22/2016 138  135 - 145 mmol/L Final  . Potassium 05/22/2016 4.0  3.5 - 5.1 mmol/L Final  . Chloride 05/22/2016 106  101 - 111 mmol/L Final  . CO2 05/22/2016 27  22 - 32 mmol/L Final  . Glucose, Bld 05/22/2016 144* 65 - 99 mg/dL Final  . BUN 05/22/2016 12  6 - 20 mg/dL Final  . Creatinine, Ser 05/22/2016 0.84  0.61 - 1.24 mg/dL Final  . Calcium 05/22/2016 9.1  8.9 - 10.3 mg/dL Final  . Total Protein 05/22/2016 7.7  6.5 - 8.1 g/dL Final  . Albumin 05/22/2016 3.1* 3.5 - 5.0 g/dL Final  . AST 05/22/2016 40  15 - 41 U/L Final  . ALT 05/22/2016 33  17 - 63 U/L Final  . Alkaline Phosphatase 05/22/2016 104  38 - 126 U/L Final  . Total Bilirubin 05/22/2016 0.7  0.3 - 1.2 mg/dL Final  . GFR calc non Af Amer 05/22/2016 >60  >60 mL/min Final  . GFR calc Af Amer 05/22/2016 >60  >60 mL/min Final   Comment: (NOTE) The eGFR has been calculated using the CKD EPI equation. This calculation has not been validated in all clinical situations. eGFR's persistently <60 mL/min signify possible Chronic Kidney Disease.   . Anion gap 05/22/2016 5  5 - 15 Final  . Magnesium 05/22/2016 1.9  1.7 - 2.4 mg/dL Final    Assessment:  Gerald [name] is a 65 y.o. male with stage IV non-small cell lung cancer and associated leukocytosis with eosinophilia. He presented with a 30 pound weight loss, shortness of breath, cough, and left leg discomfort. He has a 13-15 pack year smoking  history.  Chest CT angiogram on 03/27/2016 revealed an 8 cm mass in the medial right upper lobe. The mass abutted the mediastinum and possibly invaded the left atrium. There was associated widespread thoracic adenopathy. There was a 2.6 cm enhancing lesion in the medial segment of the left hepatic lobe.  Bronchoscopy on 03/30/2016 revealed an endobronchial lesion in the RUL anterior segment with 95% occlusion. There was extrinsic compression in the right middle lobe secondary to posterior mass effect. Pathology confirmed non-small cell lung cancer, favor adenocarcinoma.  PD-L1 testing revealed high expression (> 50%).  CEA was 2.3 on 03/27/2016.  PET scan on 04/05/2016 revealed intense hypermetabolism (SUV 17.95) associated with a 7.5 cm central perihilar right lung lesion. There was mediastinal (sub-carinal node SUV 8.89; 2.6 cm right paratracheal node SUV 17.49) and upper abdominal retroperitoneal hypermetabolic adenopathy (9 mm aortocaval node SUV 9.2). There were 2 liver metastasis (2.3 cm left lobe SUV 6.19; 2.2 cm segment VIII SUV 5.18). There are multifocal bone metastasis (C2 vertebral body SUV 9.0; 1.6 cm left sacral ala lesion SUV of 8.6).  Head MRI on 03/31/2016 was indeterminate for early metastatic disease to the brain. There was a small focus of gyral enhancement in the anterior right frontal lobe likely is post ischemic, while a small posterior right cerebellar enhancing lesion was more suspicious for small brain metastasis. There were multiple small primarily subacute appearing infarcts in the bilateral MCA and left PICA territories.   Head MRI on 05/12/2016 revealed expected interval evolution of bilateral cerebral in cerebellar infarcts. There was resolution of right frontal and right cerebellar enhancement consistent with subacute infarcts. There was a new 3 mm focus of gyral enhancement in the left occipital lobe and a possible new 4 mm enhancing lesion in the left cerebellum  (? vascular enhancement versus tiny metastasis).   Bone scan on 04/07/2016 revealed a subtle distal right femur lesion which might be a small metastasis.  Bone scan is felt to be an unreliable modality for evaluation of osseous metastatic disease as the bone metastases seen  on the recent PET and MRI were not evident.  He has marked leukocytosis with hypereosinophilia. Initial WBC was 72,300 with 53% eosinophils. Bone marrow on 03/29/2016 revealed marked increase in marrow eosinophils (approximate 30%). There was no diagnostic morphologic evidence of a myeloproliferative or lymphoid neoplasm. Flow cytometry revealed significant increase in eosinophils (54%). There was relative decreased myeloid cells with no significant immunophenotypic abnormalities or increase in blasts. There was no lymphoid abnormalities or evidence of clonality. FISH studies were negative for myeloproliferative neoplasms with eosinophilia (PDGFRA, PDGFRB, FGFR1). BCR-ABL was negative on 03/28/2016.  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  Left lower extremity duplex on 03/25/2016 revealed no evidence of DVT. Discomfort improved on Neurontin 300 mg a day and continues to improve with declining eosinophilia.  He began radiation on 04/11/2016.  He is s/p 3 weeks of carboplatin and Taxol (04/17/2016 - 05/01/2016).  He took a break.  Symptomatically, he feels "ok". His neuropathy has improved (only left great toe).  Hemoptysis continues to improve.  Weight is down 2 pounds.  Exam is stable.  Code status is DNR/DNI.  Plan: 1.  Labs today:  CBC with diff, CMP, Mg. 2.  Week #4 carboplatin and Taxol. 3.  Discuss Xgeva for bone metastasis.  Patient would like to receive. 4.  Xgeva today. 5.  Discuss patient's thoughts about Keytruda.  Patient would like to initiate in approximately 2 weeks. 6.  Discuss neuropathy.  Patient would like to continue current dose. 7.  Refill  Neurontin 300 mg po qhs; dis: # 30 with 2 refills. 8.  Discuss caloric intake.  Patient declines appetite stimulant. 9.  RTC on 06/06/2016 for MD assessment, labs (CBC with diff, CMP, Mg, CEA, TSH), and Keytruda.   Lequita Asal, MD  05/22/2016, 9:00 AM

## 2016-05-22 NOTE — Progress Notes (Signed)
Pt reports Sat he had numbness in his big toe on left foot. No other changes since last visit  Pt reports needing refill on Gabapentin

## 2016-05-23 ENCOUNTER — Encounter: Payer: Self-pay | Admitting: Hematology and Oncology

## 2016-05-23 ENCOUNTER — Ambulatory Visit: Payer: Medicare Other

## 2016-05-23 ENCOUNTER — Ambulatory Visit
Admission: RE | Admit: 2016-05-23 | Discharge: 2016-05-23 | Disposition: A | Discharge status: Home / Self Care | Payer: Medicare Other | Source: Ambulatory Visit | Attending: Radiation Oncology | Admitting: Radiation Oncology

## 2016-05-23 DIAGNOSIS — Z510 Encounter for antineoplastic radiation therapy: Secondary | ICD-10-CM | POA: Diagnosis not present

## 2016-05-23 DIAGNOSIS — C3411 Malignant neoplasm of upper lobe, right bronchus or lung: Secondary | ICD-10-CM | POA: Diagnosis not present

## 2016-05-23 NOTE — Progress Notes (Signed)
[facility] day:  05/18/2016  Chief Complaint: Gerald [name] is a 65 y.o. male with metastatic lung cancer and associated hypereosinophilia who is seen for assessment prior to week #4 carboplatin and Taxol with radiation.  HPI:  The patient was last seen in the medical oncology clinic on 05/01/2016.  At that time, he received week #3 carboplatin and Taxol.  Symptomatically, he had mild odynophagia secondary to radiation. Hemoptysis had improved.  Exam was stable.  At last visit, we discussed treatment after completion of radiation.  He was considering Keytruda.  We discussed follow-up head MRI.    He states that he took a break after last visit.  Last radiation was on 05/09/2016.  He comments that he was fatigued and had no appetite.  He had to force himself to eat.   Head MRI on 05/12/2016 revealed expected interval evolution of bilateral cerebral in cerebellar infarcts.  There was resolution of right frontal and right cerebellar enhancement consistent with subacute infarcts on the prior study.  There was a new 3 mm focus of gyral enhancement in the left occipital lobe (? new, subacute infarct versus tiny metastasis).  Short-term follow-up was warranted.  There was a possible new 4 mm enhancing lesion in the left cerebellum (? vascular enhancement and pulsation artifact versus a tiny metastasis).  There was unchanged osseous metastatic disease in the upper cervical spine and heterogeneous calvarial bone marrow signal.  Symptomatically, he is feeling better.  He believes that radiation will complete on 05/22/2016 or 05/23/2016.   Past Medical History  Diagnosis Date  . Arthritis     Rheumatoid arthritis  . Pneumothorax     spontaneous  . Renal disorder     as a child  . Mass of lung   . Collagen vascular disease ([place])     RA    Past Surgical History  Procedure Laterality Date  . Ureter repair as a child    . Flexible bronchoscopy N/A  03/30/2016    Procedure: FLEXIBLE BRONCHOSCOPY;  Surgeon: Vilinda Boehringer, MD;  Location: [facility] ORS;  Service: Cardiopulmonary;  Laterality: N/A;    Family History  Problem Relation Age of Onset  . Brain cancer Mother   . Lung cancer Maternal Uncle   . Cancer Sister     Metastatic cancer- unknown primary    Social History:  reports that he quit smoking about 15 years ago. His smoking use included Cigarettes. He has a 13 pack-year smoking history. He does not have any smokeless tobacco history on file. He reports that he drinks alcohol. He reports that he does not use illicit drugs.  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  He will be getting a refrigerator soon.  He lives in [place].  Contact number is [phone].  The patient is alone today.  Allergies:  Allergies  Allergen Reactions  . Nsaids Other (See Comments)    Pt is unable to take this class of medications.    . Sulfa Antibiotics Other (See Comments)    Reaction:  GI upset     Current Medications: Current Outpatient Prescriptions  Medication Sig Dispense Refill  . atorvastatin (LIPITOR) 40 MG tablet Take 1 tablet (40 mg total) by mouth daily. 30 tablet 0  . ondansetron (ZOFRAN) 8 MG tablet Take 1 tablet (8 mg total) by mouth 2 (two) times daily as needed for nausea or vomiting. [place]  tablet 0  . sucralfate (CARAFATE) 1 g tablet Take 1 tablet (1 g total) by mouth 3 (three) times daily. Dissolve in 2-3 tbsp warm water, swish and swallow. 90 tablet 3  . gabapentin (NEURONTIN) 300 MG capsule Take 1 capsule (300 mg total) by mouth at bedtime. 30 capsule 2  . HYDROcodone-acetaminophen (NORCO) 7.5-325 MG tablet Take 1 tablet by mouth every 4 (four) hours as needed for moderate pain (Every 4-6 hours as needed for pain). 30 tablet 0   No current facility-administered medications for this visit.    Review of Systems:  GENERAL:  Feeling better.  No fevers or sweats.  Weight down 2  pounds. PERFORMANCE STATUS (ECOG):  1 HEENT: No visual changes, sore throat, mouth sores or tenderness. Lungs:  Shortness of breath on exertion.  Cough.  Hemoptysis (volume less). Cardiac:  No chest pain, palpitations, orthopnea, or PND.  Sleeps in a recliner. GI:  Appetite "ok".  Poor appetite.  Nausea after traveling in car.  No vomiting, diarrhea, constipation, melena or hematochezia. GU:  No urgency, frequency, dysuria, or hematuria. Musculoskeletal:  Pain in right knee.  No back pain.  No muscle tenderness. Extremities:  No pain or swelling. Skin:  No rashes or skin changes. Neuro:  Little numbness bottom of left foot, improved on Neurontin.  No headache, weakness, balance or coordination issues. Endocrine:  No diabetes, thyroid issues, hot flashes or night sweats. Psych:  No mood changes, depression or anxiety. Pain:  Right knee pain.  Right sided chest discomfort.  Review of systems:  All other systems reviewed and found to be negative.  Physical Exam: Blood pressure 101/70, pulse 96, temperature 96.7 F (35.9 C), temperature source Tympanic, weight 130 lb 2.9 oz (59.05 kg). GENERAL:  Thin gentleman sitting comfortably in the exam room in no acute distress. MENTAL STATUS:  Alert and oriented to person, place and time. HEAD:  Wearing a [employer] camo cap.  Long gray hair in pony tail.  Scruffy gray beard.  Temporal wasting.  Normocephalic, atraumatic, face symmetric, no Cushingoid features. EYES:  Blue eyes.  Pupils equal round and reactive to light and accomodation.  No conjunctivitis or scleral icterus. ENT:  Oropharynx clear without lesion.  Tongue normal. Mucous membranes moist.  RESPIRATORY:  Clear to auscultation without rales, wheezes or rhonchi. CARDIOVASCULAR:  Regular rate and rhythm without murmur, rub or gallop. ABDOMEN:  Soft, non-tender, with active bowel sounds, and no hepatosplenomegaly.  No masses. SKIN:  No rashes, ulcers or lesions. EXTREMITIES: No edema, no skin  discoloration or tenderness.  No palpable cords. LYMPH NODES: No palpable cervical, supraclavicular, axillary or inguinal adenopathy  NEUROLOGICAL: Unremarkable. PSYCH:  Appropriate.    Appointment on 05/16/2016  Component Date Value Ref Range Status  . WBC 05/16/2016 9.9  3.8 - 10.6 K/uL Final  . RBC 05/16/2016 3.95* 4.40 - 5.90 MIL/uL Final  . Hemoglobin 05/16/2016 12.1* 13.0 - 18.0 g/dL Final  . HCT 05/16/2016 35.9* 40.0 - 52.0 % Final  . MCV 05/16/2016 90.9  80.0 - 100.0 fL Final  . MCH 05/16/2016 30.7  26.0 - 34.0 pg Final  . MCHC 05/16/2016 33.8  32.0 - 36.0 g/dL Final  . RDW 05/16/2016 16.5* 11.5 - 14.5 % Final  . Platelets 05/16/2016 221  150 - 440 K/uL Final  . Neutrophils Relative % 05/16/2016 49%   Final  . Neutro Abs 05/16/2016 4.8  1.4 - 6.5 K/uL Final  . Lymphocytes Relative 05/16/2016 9%   Final  . Lymphs Abs 05/16/2016  0.9* 1.0 - 3.6 K/uL Final  . Monocytes Relative 05/16/2016 20%   Final  . Monocytes Absolute 05/16/2016 1.9* 0.2 - 1.0 K/uL Final  . Eosinophils Relative 05/16/2016 22%   Final  . Eosinophils Absolute 05/16/2016 2.2* 0 - 0.7 K/uL Final   RESULT REPEATED AND VERIFIED  . Basophils Relative 05/16/2016 0%   Final  . Basophils Absolute 05/16/2016 0.0  0 - 0.1 K/uL Final  . Sodium 05/16/2016 137  135 - 145 mmol/L Final  . Potassium 05/16/2016 4.7  3.5 - 5.1 mmol/L Final  . Chloride 05/16/2016 105  101 - 111 mmol/L Final  . CO2 05/16/2016 27  22 - 32 mmol/L Final  . Glucose, Bld 05/16/2016 105* 65 - 99 mg/dL Final  . BUN 05/16/2016 11  6 - 20 mg/dL Final  . Creatinine, Ser 05/16/2016 0.71  0.61 - 1.24 mg/dL Final  . Calcium 05/16/2016 8.8* 8.9 - 10.3 mg/dL Final  . Total Protein 05/16/2016 6.9  6.5 - 8.1 g/dL Final  . Albumin 05/16/2016 3.1* 3.5 - 5.0 g/dL Final  . AST 05/16/2016 35  15 - 41 U/L Final  . ALT 05/16/2016 31  17 - 63 U/L Final  . Alkaline Phosphatase 05/16/2016 81  38 - 126 U/L Final  . Total Bilirubin 05/16/2016 0.7  0.3 - 1.2 mg/dL  Final  . GFR calc non Af Amer 05/16/2016 >60  >60 mL/min Final  . GFR calc Af Amer 05/16/2016 >60  >60 mL/min Final   Comment: (NOTE) The eGFR has been calculated using the CKD EPI equation. This calculation has not been validated in all clinical situations. eGFR's persistently <60 mL/min signify possible Chronic Kidney Disease.   . Anion gap 05/16/2016 5  5 - 15 Final  . Magnesium 05/16/2016 1.9  1.7 - 2.4 mg/dL Final    Assessment:  Reece Fehnel is a 65 y.o. male with stage IV non-small cell lung cancer and associated leukocytosis with eosinophilia. He presented with a 30 pound weight loss, shortness of breath, cough, and left leg discomfort. He has a 13-15 pack year smoking history.  Chest CT angiogram on 03/27/2016 revealed an 8 cm mass in the medial right upper lobe. The mass abutted the mediastinum and possibly invaded the left atrium. There was associated widespread thoracic adenopathy. There was a 2.6 cm enhancing lesion in the medial segment of the left hepatic lobe.  Bronchoscopy on 03/30/2016 revealed an endobronchial lesion in the RUL anterior segment with 95% occlusion. There was extrinsic compression in the right middle lobe secondary to posterior mass effect. Pathology confirmed non-small cell lung cancer, favor adenocarcinoma.  PD-L1 testing revealed high expression (> 50%).  CEA was 2.3 on 03/27/2016.  PET scan on 04/05/2016 revealed intense hypermetabolism (SUV 17.95) associated with a 7.5 cm central perihilar right lung lesion. There was mediastinal (sub-carinal node SUV 8.89; 2.6 cm right paratracheal node SUV 17.49) and upper abdominal retroperitoneal hypermetabolic adenopathy (9 mm aortocaval node SUV 9.2). There were 2 liver metastasis (2.3 cm left lobe SUV 6.19; 2.2 cm segment VIII SUV 5.18). There are multifocal bone metastasis (C2 vertebral body SUV 9.0; 1.6 cm left sacral ala lesion SUV of 8.6).  Head MRI on 03/31/2016 was indeterminate for early metastatic  disease to the brain. There was a small focus of gyral enhancement in the anterior right frontal lobe likely is post ischemic, while a small posterior right cerebellar enhancing lesion was more suspicious for small brain metastasis. There were multiple small primarily subacute appearing infarcts  in the bilateral MCA and left PICA territories. There was no associated hemorrhage or mass effect. There was osseous metastatic disease in the cervical spine.   Head MRI on 05/12/2016 revealed expected interval evolution of bilateral cerebral in cerebellar infarcts.  There was resolution of right frontal and right cerebellar enhancement consistent with subacute infarcts.  There was a new 3 mm focus of gyral enhancement in the left occipital lobe and a possible new 4 mm enhancing lesion in the left cerebellum (? vascular enhancement and pulsation artifact versus a tiny metastasis).    Bone scan on 04/07/2016 revealed a subtle distal right femur lesion which might be a small metastasis.  Bone scan is felt to be an unreliable modality for evaluation of osseous metastatic disease as the bone metastases seen on the recent PET and MRI were not evident.  He has marked leukocytosis with hypereosinophilia. Initial WBC was 72,300 with 53% eosinophils. Bone marrow on 03/29/2016 revealed marked increase in marrow eosinophils (approximate 30%). There was no diagnostic morphologic evidence of a myeloproliferative or lymphoid neoplasm. Flow cytometry revealed significant increase in eosinophils (54%). There was relative decreased myeloid cells with no significant immunophenotypic abnormalities or increase in blasts. There was no lymphoid abnormalities or evidence of clonality. FISH studies were negative for myeloproliferative neoplasms with eosinophilia (PDGFRA, PDGFRB, FGFR1). BCR-ABL was negative on 03/28/2016.  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 Left lower  extremity duplex on 03/25/2016 revealed no evidence of DVT. Discomfort improved on Neurontin 300 mg a day and continues to improve with declining eosinophilia.  He began radiation on 04/11/2016.  He is s/p 3 weeks of carboplatin and Taxol (04/17/2016 - 05/01/2016).  He took a break on 05/09/2016.  Symptomatically, he is feeling beter.  He continues to lose weight.  Exam is stable.  Code status is DNR/DNI.  Plan: 1.  Labs today:  CBC with diff, CMP, Mg. 2.  No chemotherapy today per patient. 3.  Discuss patient's thoughts about therapy.  He would like to continue radiation and chemotherapy next week. 4.  Discuss patient's thoughts about Keytruda.  He is undecided.  Information provided. 5.  Discuss head MRI on 05/12/2016.  Discuss plan for follow-up head MRI. 6.  Discuss weight and caloric intake.  Patient does not want an appetite stimulant. 7.  Refill hydrocodone 7.5/acetaminphen 325 mg 1 tablet every 4 hours prn pain; dis: # 30. 8.  RTC 05/22/2016 for MD assessment, labs (CBC with diff, CMP, Mg), and cycle #4 carboplatin and Taxol.   Lequita Asal, MD  05/18/2016

## 2016-06-06 ENCOUNTER — Inpatient Hospital Stay: Payer: Medicare Other | Admitting: Hematology and Oncology

## 2016-06-06 ENCOUNTER — Other Ambulatory Visit: Payer: Self-pay | Admitting: Hematology and Oncology

## 2016-06-06 ENCOUNTER — Inpatient Hospital Stay: Payer: Medicare Other

## 2016-06-08 ENCOUNTER — Inpatient Hospital Stay: Payer: Medicare Other

## 2016-06-08 ENCOUNTER — Encounter: Payer: Self-pay | Admitting: Hematology and Oncology

## 2016-06-08 ENCOUNTER — Ambulatory Visit
Admission: RE | Admit: 2016-06-08 | Discharge: 2016-06-08 | Disposition: A | Discharge status: Home / Self Care | Payer: Medicare Other | Source: Ambulatory Visit | Attending: Hematology and Oncology | Admitting: Hematology and Oncology

## 2016-06-08 ENCOUNTER — Inpatient Hospital Stay (HOSPITAL_BASED_OUTPATIENT_CLINIC_OR_DEPARTMENT_OTHER): Payer: Medicare Other | Admitting: Hematology and Oncology

## 2016-06-08 ENCOUNTER — Other Ambulatory Visit: Payer: Self-pay | Admitting: Hematology and Oncology

## 2016-06-08 VITALS — BP 100/63 | HR 119 | Temp 100.9°F | Resp 21 | Ht 71.0 in | Wt 128.9 lb

## 2016-06-08 DIAGNOSIS — R509 Fever, unspecified: Secondary | ICD-10-CM | POA: Diagnosis not present

## 2016-06-08 DIAGNOSIS — Z801 Family history of malignant neoplasm of trachea, bronchus and lung: Secondary | ICD-10-CM

## 2016-06-08 DIAGNOSIS — Z808 Family history of malignant neoplasm of other organs or systems: Secondary | ICD-10-CM

## 2016-06-08 DIAGNOSIS — C7951 Secondary malignant neoplasm of bone: Secondary | ICD-10-CM

## 2016-06-08 DIAGNOSIS — R630 Anorexia: Secondary | ICD-10-CM

## 2016-06-08 DIAGNOSIS — R590 Localized enlarged lymph nodes: Secondary | ICD-10-CM

## 2016-06-08 DIAGNOSIS — I639 Cerebral infarction, unspecified: Secondary | ICD-10-CM

## 2016-06-08 DIAGNOSIS — J189 Pneumonia, unspecified organism: Secondary | ICD-10-CM

## 2016-06-08 DIAGNOSIS — Z923 Personal history of irradiation: Secondary | ICD-10-CM

## 2016-06-08 DIAGNOSIS — Z809 Family history of malignant neoplasm, unspecified: Secondary | ICD-10-CM

## 2016-06-08 DIAGNOSIS — R059 Cough, unspecified: Secondary | ICD-10-CM

## 2016-06-08 DIAGNOSIS — R05 Cough: Secondary | ICD-10-CM | POA: Diagnosis not present

## 2016-06-08 DIAGNOSIS — J181 Lobar pneumonia, unspecified organism: Principal | ICD-10-CM

## 2016-06-08 DIAGNOSIS — Z5111 Encounter for antineoplastic chemotherapy: Secondary | ICD-10-CM | POA: Diagnosis not present

## 2016-06-08 DIAGNOSIS — R918 Other nonspecific abnormal finding of lung field: Secondary | ICD-10-CM | POA: Insufficient documentation

## 2016-06-08 DIAGNOSIS — C3411 Malignant neoplasm of upper lobe, right bronchus or lung: Secondary | ICD-10-CM

## 2016-06-08 DIAGNOSIS — R042 Hemoptysis: Secondary | ICD-10-CM | POA: Diagnosis not present

## 2016-06-08 DIAGNOSIS — Z79899 Other long term (current) drug therapy: Secondary | ICD-10-CM

## 2016-06-08 DIAGNOSIS — G629 Polyneuropathy, unspecified: Secondary | ICD-10-CM

## 2016-06-08 DIAGNOSIS — J690 Pneumonitis due to inhalation of food and vomit: Secondary | ICD-10-CM | POA: Insufficient documentation

## 2016-06-08 DIAGNOSIS — D721 Eosinophilia: Secondary | ICD-10-CM

## 2016-06-08 DIAGNOSIS — M069 Rheumatoid arthritis, unspecified: Secondary | ICD-10-CM

## 2016-06-08 DIAGNOSIS — Z66 Do not resuscitate: Secondary | ICD-10-CM

## 2016-06-08 DIAGNOSIS — Z87891 Personal history of nicotine dependence: Secondary | ICD-10-CM

## 2016-06-08 DIAGNOSIS — D479 Neoplasm of uncertain behavior of lymphoid, hematopoietic and related tissue, unspecified: Secondary | ICD-10-CM

## 2016-06-08 LAB — URINALYSIS COMPLETE WITH MICROSCOPIC (ARMC ONLY)
Bilirubin Urine: NEGATIVE
Glucose, UA: NEGATIVE mg/dL
Hgb urine dipstick: NEGATIVE
Ketones, ur: NEGATIVE mg/dL
Leukocytes, UA: NEGATIVE
Nitrite: NEGATIVE
Protein, ur: 30 mg/dL — AB
Specific Gravity, Urine: 1.02 (ref 1.005–1.030)
pH: 5 (ref 5.0–8.0)

## 2016-06-08 LAB — COMPREHENSIVE METABOLIC PANEL
ALT: 30 U/L (ref 17–63)
AST: 42 U/L — ABNORMAL HIGH (ref 15–41)
Albumin: 3.2 g/dL — ABNORMAL LOW (ref 3.5–5.0)
Alkaline Phosphatase: 75 U/L (ref 38–126)
Anion gap: 7 (ref 5–15)
BUN: 13 mg/dL (ref 6–20)
CO2: 27 mmol/L (ref 22–32)
Calcium: 8.5 mg/dL — ABNORMAL LOW (ref 8.9–10.3)
Chloride: 102 mmol/L (ref 101–111)
Creatinine, Ser: 0.63 mg/dL (ref 0.61–1.24)
GFR calc Af Amer: >60 mL/min (ref >60)
GFR calc non Af Amer: >60 mL/min (ref >60)
Glucose, Bld: 109 mg/dL — ABNORMAL HIGH (ref 65–99)
Potassium: 4.2 mmol/L (ref 3.5–5.1)
Sodium: 136 mmol/L (ref 135–145)
Total Bilirubin: 0.8 mg/dL (ref 0.3–1.2)
Total Protein: 7.7 g/dL (ref 6.5–8.1)

## 2016-06-08 LAB — CBC WITH DIFFERENTIAL/PLATELET
Basophils Absolute: 0.1 10*3/uL (ref 0–0.1)
Basophils Relative: 2 %
Eosinophils Absolute: 1.1 10*3/uL — ABNORMAL HIGH (ref 0–0.7)
Eosinophils Relative: 17 %
HCT: 34.5 % — ABNORMAL LOW (ref 40.0–52.0)
Hemoglobin: 11.7 g/dL — ABNORMAL LOW (ref 13.0–18.0)
Lymphocytes Relative: 11 %
Lymphs Abs: 0.7 10*3/uL — ABNORMAL LOW (ref 1.0–3.6)
MCH: 32 pg (ref 26.0–34.0)
MCHC: 33.9 g/dL (ref 32.0–36.0)
MCV: 94.4 fL (ref 80.0–100.0)
Monocytes Absolute: 1.8 10*3/uL — ABNORMAL HIGH (ref 0.2–1.0)
Monocytes Relative: 28 %
Neutro Abs: 2.8 10*3/uL (ref 1.4–6.5)
Neutrophils Relative %: 42 %
Platelets: 220 10*3/uL (ref 150–440)
RBC: 3.65 MIL/uL — ABNORMAL LOW (ref 4.40–5.90)
RDW: 19.3 % — ABNORMAL HIGH (ref 11.5–14.5)
WBC: 6.5 10*3/uL (ref 3.8–10.6)

## 2016-06-08 LAB — TSH: TSH: 2.93 u[IU]/mL (ref 0.350–4.500)

## 2016-06-08 LAB — MAGNESIUM: Magnesium: 1.8 mg/dL (ref 1.7–2.4)

## 2016-06-08 MED ORDER — LEVOFLOXACIN 500 MG PO TABS: 500.0000 mg | ORAL_TABLET | Freq: Every day | ORAL | Status: DC

## 2016-06-08 NOTE — Progress Notes (Signed)
[facility] day:  06/08/2016  Chief Complaint: Gerald [name] is a 65 y.o. male with metastatic lung cancer and associated hypereosinophilia who is seen for assessment prior to initiation of Keytruda.  HPI:  The patient was last seen in the medical oncology clinic on 05/22/2016.  At that time, he received week #4 carboplatin and Taxol with radiation.  He completed radiation on 05/23/2016.  Symptomatically, patient notes 3 days of chills.  He is coughing more and sneezing.  Cough is nonproductive with sometimes clear or white sputum. He denies any hemoptysis.  His daughter was recently diagnosed with pneumonia.  He notes a little bit of nausea yesterday.  Appetite is poor.  He is taking his Neurontin at night for his neuropathy.     Past Medical History  Diagnosis Date  . Arthritis     Rheumatoid arthritis  . Pneumothorax     spontaneous  . Renal disorder     as a child  . Mass of lung   . Collagen vascular disease ([place])     RA    Past Surgical History  Procedure Laterality Date  . Ureter repair as a child    . Flexible bronchoscopy N/A 03/30/2016    Procedure: FLEXIBLE BRONCHOSCOPY;  Surgeon: Vilinda Boehringer, MD;  Location: [facility] ORS;  Service: Cardiopulmonary;  Laterality: N/A;    Family History  Problem Relation Age of Onset  . Brain cancer Mother   . Lung cancer Maternal Uncle   . Cancer Sister     Metastatic cancer- unknown primary    Social History:  reports that he quit smoking about 16 years ago. His smoking use included Cigarettes. He has a 13 pack-year smoking history. He does not have any smokeless tobacco history on file. He reports that he drinks alcohol. He reports that he does not use illicit drugs.  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  He will be getting a refrigerator soon.  He lives in [place].  Contact number is [phone].  The patient is accompanied by his  daughter today.  Allergies:  Allergies  Allergen Reactions  . Nsaids Other (See Comments)    Pt is unable to take this class of medications.    . Sulfa Antibiotics Other (See Comments)    Reaction:  GI upset     Current Medications: Current Outpatient Prescriptions  Medication Sig Dispense Refill  . atorvastatin (LIPITOR) 40 MG tablet Take 1 tablet (40 mg total) by mouth daily. 30 tablet 0  . gabapentin (NEURONTIN) 300 MG capsule Take 1 capsule (300 mg total) by mouth at bedtime. 30 capsule 2  . HYDROcodone-acetaminophen (NORCO) 7.5-325 MG tablet Take 1 tablet by mouth every 4 (four) hours as needed for moderate pain (Every 4-6 hours as needed for pain). 30 tablet 0  . ondansetron (ZOFRAN) 8 MG tablet Take 1 tablet (8 mg total) by mouth 2 (two) times daily as needed for nausea or vomiting. (Patient not taking: Reported on 06/08/2016) 20 tablet 0  . sucralfate (CARAFATE) 1 g tablet Take 1 tablet (1 g total) by mouth 3 (three) times daily. Dissolve in 2-3 tbsp warm water, swish and swallow. (Patient not taking: Reported on 06/08/2016) 90 tablet 3   No current facility-administered medications for this visit.    Review of Systems:  GENERAL:  Fatigue.  Chills, but no fevers or sweats.  Weight stable. PERFORMANCE STATUS (ECOG):  1 HEENT: No visual changes, sore throat, mouth sores or tenderness. Lungs:  Shortness of breath on exertion.  Cough, more (clear or white).  Ni hemoptysis. Cardiac:  No chest pain, palpitations, orthopnea, or PND.  Sleeps in a recliner. GI:  Appetite poor.  No nausea, vomiting, diarrhea, constipation, melena or hematochezia. GU:  No urgency, frequency, dysuria, or hematuria. Musculoskeletal:  Pain in right knee.  No back pain.  No muscle tenderness. Extremities:  No pain or swelling. Skin:  No rashes or skin changes. Neuro:  Little numbness in left great toe, improved on Neurontin (night only).  No headache, weakness, balance or coordination issues. Endocrine:  No  diabetes, thyroid issues, hot flashes or night sweats. Psych:  No mood changes, depression or anxiety. Pain:  Right knee pain.  Right sided chest discomfort.  Review of systems:  All other systems reviewed and found to be negative.  Physical Exam: Blood pressure 100/63, pulse 119, temperature 100.9 F (38.3 C), temperature source Tympanic, resp. rate 21, height 5' 11" (1.803 m), weight 128 lb 13.7 oz (58.45 kg). GENERAL:  Thin gentleman sitting comfortably in the exam room in no acute distress. MENTAL STATUS:  Alert and oriented to person, place and time. HEAD:  Wearing a  camo cap.  Long gray hair in pony tail.  Scruffy gray beard.  Temporal wasting.  Normocephalic, atraumatic, face symmetric, no Cushingoid features. EYES:  Blue eyes.  Pupils equal round and reactive to light and accomodation.  No conjunctivitis or scleral icterus. ENT:  Oropharynx clear without lesion.  Tongue normal. Mucous membranes moist.  RESPIRATORY:  Clear to auscultation without rales, wheezes or rhonchi. CARDIOVASCULAR:  Regular rate and rhythm without murmur, rub or gallop. ABDOMEN:  Soft, non-tender, with active bowel sounds, and no hepatosplenomegaly.  No masses. SKIN:  No rashes, ulcers or lesions. EXTREMITIES: No edema, no skin discoloration or tenderness.  No palpable cords. LYMPH NODES: No palpable cervical, supraclavicular, axillary or inguinal adenopathy  NEUROLOGICAL: Unremarkable. PSYCH:  Appropriate.    Appointment on 06/08/2016  Component Date Value Ref Range Status  . WBC 06/08/2016 6.5  3.8 - 10.6 K/uL Final  . RBC 06/08/2016 3.65* 4.40 - 5.90 MIL/uL Final  . Hemoglobin 06/08/2016 11.7* 13.0 - 18.0 g/dL Final  . HCT 06/08/2016 34.5* 40.0 - 52.0 % Final  . MCV 06/08/2016 94.4  80.0 - 100.0 fL Final  . MCH 06/08/2016 32.0  26.0 - 34.0 pg Final  . MCHC 06/08/2016 33.9  32.0 - 36.0 g/dL Final  . RDW 06/08/2016 19.3* 11.5 - 14.5 % Final  . Platelets 06/08/2016 220  150 - 440 K/uL Final  .  Neutrophils Relative % 06/08/2016 42   Final  . Neutro Abs 06/08/2016 2.8  1.4 - 6.5 K/uL Final  . Lymphocytes Relative 06/08/2016 11   Final  . Lymphs Abs 06/08/2016 0.7* 1.0 - 3.6 K/uL Final  . Monocytes Relative 06/08/2016 28   Final  . Monocytes Absolute 06/08/2016 1.8* 0.2 - 1.0 K/uL Final  . Eosinophils Relative 06/08/2016 17   Final  . Eosinophils Absolute 06/08/2016 1.1* 0 - 0.7 K/uL Final  . Basophils Relative 06/08/2016 2   Final  . Basophils Absolute 06/08/2016 0.1  0 - 0.1 K/uL Final  . Sodium 06/08/2016 136  135 - 145 mmol/L Final  . Potassium 06/08/2016 4.2  3.5 - 5.1 mmol/L Final  . Chloride 06/08/2016 102  101 - 111 mmol/L Final  . CO2 06/08/2016 27  22 - 32 mmol/L Final  .  Glucose, Bld 06/08/2016 109* 65 - 99 mg/dL Final  . BUN 06/08/2016 13  6 - 20 mg/dL Final  . Creatinine, Ser 06/08/2016 0.63  0.61 - 1.24 mg/dL Final  . Calcium 06/08/2016 8.5* 8.9 - 10.3 mg/dL Final  . Total Protein 06/08/2016 7.7  6.5 - 8.1 g/dL Final  . Albumin 06/08/2016 3.2* 3.5 - 5.0 g/dL Final  . AST 06/08/2016 42* 15 - 41 U/L Final  . ALT 06/08/2016 30  17 - 63 U/L Final  . Alkaline Phosphatase 06/08/2016 75  38 - 126 U/L Final  . Total Bilirubin 06/08/2016 0.8  0.3 - 1.2 mg/dL Final  . GFR calc non Af Amer 06/08/2016 >60  >60 mL/min Final  . GFR calc Af Amer 06/08/2016 >60  >60 mL/min Final   Comment: (NOTE) The eGFR has been calculated using the CKD EPI equation. This calculation has not been validated in all clinical situations. eGFR's persistently <60 mL/min signify possible Chronic Kidney Disease.   . Anion gap 06/08/2016 7  5 - 15 Final  . Magnesium 06/08/2016 1.8  1.7 - 2.4 mg/dL Final    Assessment:  Berlie Hatchel is a 65 y.o. male with stage IV non-small cell lung cancer and associated leukocytosis with eosinophilia. He presented with a 30 pound weight loss, shortness of breath, cough, and left leg discomfort. He has a 13-15 pack year smoking history.  Chest CT angiogram on  03/27/2016 revealed an 8 cm mass in the medial right upper lobe. The mass abutted the mediastinum and possibly invaded the left atrium. There was associated widespread thoracic adenopathy. There was a 2.6 cm enhancing lesion in the medial segment of the left hepatic lobe.  Bronchoscopy on 03/30/2016 revealed an endobronchial lesion in the RUL anterior segment with 95% occlusion. There was extrinsic compression in the right middle lobe secondary to posterior mass effect. Pathology confirmed non-small cell lung cancer, favor adenocarcinoma.  PD-L1 testing revealed high expression (> 50%).  CEA was 2.3 on 03/27/2016.  PET scan on 04/05/2016 revealed intense hypermetabolism (SUV 17.95) associated with a 7.5 cm central perihilar right lung lesion. There was mediastinal (sub-carinal node SUV 8.89; 2.6 cm right paratracheal node SUV 17.49) and upper abdominal retroperitoneal hypermetabolic adenopathy (9 mm aortocaval node SUV 9.2). There were 2 liver metastasis (2.3 cm left lobe SUV 6.19; 2.2 cm segment VIII SUV 5.18). There are multifocal bone metastasis (C2 vertebral body SUV 9.0; 1.6 cm left sacral ala lesion SUV of 8.6).   Head MRI on 03/31/2016 was indeterminate for early metastatic disease to the brain. There was a small focus of gyral enhancement in the anterior right frontal lobe likely is post ischemic, while a small posterior right cerebellar enhancing lesion was more suspicious for small brain metastasis. There were multiple small primarily subacute appearing infarcts in the bilateral MCA and left PICA territories.   Head MRI on 05/12/2016 revealed expected interval evolution of bilateral cerebral in cerebellar infarcts. There was resolution of right frontal and right cerebellar enhancement consistent with subacute infarcts. There was a new 3 mm focus of gyral enhancement in the left occipital lobe and a possible new 4 mm enhancing lesion in the left cerebellum (? vascular enhancement versus  tiny metastasis).   Bone scan on 04/07/2016 revealed a subtle distal right femur lesion which might be a small metastasis.  Bone scan is felt to be an unreliable modality for evaluation of osseous metastatic disease as the bone metastases seen on the recent PET and MRI were not  evident.  He receives [place] monthly (last 05/22/2016).  He has marked leukocytosis with hypereosinophilia. Initial WBC was 72,300 with 53% eosinophils. Bone marrow on 03/29/2016 revealed marked increase in marrow eosinophils (approximate 30%). There was no diagnostic morphologic evidence of a myeloproliferative or lymphoid neoplasm. Flow cytometry revealed significant increase in eosinophils (54%). There was relative decreased myeloid cells with no significant immunophenotypic abnormalities or increase in blasts. There was no lymphoid abnormalities or evidence of clonality. FISH studies were negative for myeloproliferative neoplasms with eosinophilia (PDGFRA, PDGFRB, FGFR1). BCR-ABL was negative on 03/28/2016.  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  Left lower extremity duplex on 03/25/2016 revealed no evidence of DVT. Discomfort improved on Neurontin 300 mg a day and continues to improve with declining eosinophilia.  He received 41.4 Gy from 04/11/2016 - 05/23/2016.  He received 4 weeks of concurrent carboplatin and Taxol (04/17/2016 - 05/22/2016).  Symptomatically, he has had chills and increased cough  Hemoptysis has resolved.  Weight is stable.  Exam is stable.  Code status is DNR/DNI.  Plan: 1.  Labs today:  CBC with diff, CMP, Mg, TSH. 2.  Discuss plan for Keytruda.  Side effects reviewed including pneumonitis, colitis, nephritis, and endocrinopathies.  Patient consented to treatment. Discuss postponing treatment today secondary to infection. 3.  Fever work-up:  Blood cultures x 2, UA and culture, CXR. 4.  Postpone Keytruda. 5.  Patient to return after  CXR. 6.  Discuss neuropathy.  No change in medication. 7.  RTC in 1 week for MD assessment, labs (CBC with diff, CMP), and Keytruda.  Addendum:  CXR today revealed a new apical infiltrate possibly related to a post-obstructive pneumonia.  Findings were discussed with the patient.  A prescription for Levaquin was provided.    Lequita Asal, MD  06/08/2016, 9:19 AM

## 2016-06-09 LAB — CEA: CEA: 3.2 ng/mL (ref 0.0–4.7)

## 2016-06-09 LAB — URINE CULTURE: Culture: NO GROWTH

## 2016-06-13 LAB — CULTURE, BLOOD (ROUTINE X 2)
Culture: NO GROWTH
Culture: NO GROWTH

## 2016-06-15 ENCOUNTER — Encounter: Payer: Self-pay | Admitting: Hematology and Oncology

## 2016-06-15 ENCOUNTER — Other Ambulatory Visit: Payer: Self-pay | Admitting: Hematology and Oncology

## 2016-06-15 ENCOUNTER — Telehealth: Payer: Self-pay

## 2016-06-15 ENCOUNTER — Ambulatory Visit
Admission: RE | Admit: 2016-06-15 | Discharge: 2016-06-15 | Disposition: A | Discharge status: Home / Self Care | Payer: Medicare Other | Source: Ambulatory Visit | Attending: Hematology and Oncology | Admitting: Hematology and Oncology

## 2016-06-15 ENCOUNTER — Inpatient Hospital Stay (HOSPITAL_BASED_OUTPATIENT_CLINIC_OR_DEPARTMENT_OTHER): Payer: Medicare Other | Admitting: Hematology and Oncology

## 2016-06-15 ENCOUNTER — Inpatient Hospital Stay: Payer: Medicare Other

## 2016-06-15 ENCOUNTER — Other Ambulatory Visit: Payer: Self-pay

## 2016-06-15 ENCOUNTER — Inpatient Hospital Stay: Payer: Medicare Other | Attending: Hematology and Oncology

## 2016-06-15 VITALS — BP 104/62 | HR 124 | Temp 99.1°F | Ht 71.5 in | Wt 125.9 lb

## 2016-06-15 DIAGNOSIS — R918 Other nonspecific abnormal finding of lung field: Secondary | ICD-10-CM | POA: Diagnosis not present

## 2016-06-15 DIAGNOSIS — I998 Other disorder of circulatory system: Secondary | ICD-10-CM | POA: Insufficient documentation

## 2016-06-15 DIAGNOSIS — I491 Atrial premature depolarization: Secondary | ICD-10-CM | POA: Insufficient documentation

## 2016-06-15 DIAGNOSIS — C7951 Secondary malignant neoplasm of bone: Secondary | ICD-10-CM | POA: Insufficient documentation

## 2016-06-15 DIAGNOSIS — K769 Liver disease, unspecified: Secondary | ICD-10-CM | POA: Insufficient documentation

## 2016-06-15 DIAGNOSIS — Z87891 Personal history of nicotine dependence: Secondary | ICD-10-CM | POA: Insufficient documentation

## 2016-06-15 DIAGNOSIS — R000 Tachycardia, unspecified: Secondary | ICD-10-CM

## 2016-06-15 DIAGNOSIS — Z808 Family history of malignant neoplasm of other organs or systems: Secondary | ICD-10-CM | POA: Diagnosis not present

## 2016-06-15 DIAGNOSIS — Z8701 Personal history of pneumonia (recurrent): Secondary | ICD-10-CM | POA: Insufficient documentation

## 2016-06-15 DIAGNOSIS — G939 Disorder of brain, unspecified: Secondary | ICD-10-CM | POA: Insufficient documentation

## 2016-06-15 DIAGNOSIS — D72829 Elevated white blood cell count, unspecified: Secondary | ICD-10-CM | POA: Insufficient documentation

## 2016-06-15 DIAGNOSIS — C3411 Malignant neoplasm of upper lobe, right bronchus or lung: Secondary | ICD-10-CM | POA: Diagnosis not present

## 2016-06-15 DIAGNOSIS — C787 Secondary malignant neoplasm of liver and intrahepatic bile duct: Secondary | ICD-10-CM | POA: Insufficient documentation

## 2016-06-15 DIAGNOSIS — R590 Localized enlarged lymph nodes: Secondary | ICD-10-CM

## 2016-06-15 DIAGNOSIS — I517 Cardiomegaly: Secondary | ICD-10-CM | POA: Insufficient documentation

## 2016-06-15 DIAGNOSIS — Z809 Family history of malignant neoplasm, unspecified: Secondary | ICD-10-CM

## 2016-06-15 DIAGNOSIS — J189 Pneumonia, unspecified organism: Secondary | ICD-10-CM | POA: Diagnosis not present

## 2016-06-15 DIAGNOSIS — N2889 Other specified disorders of kidney and ureter: Secondary | ICD-10-CM | POA: Diagnosis not present

## 2016-06-15 DIAGNOSIS — Z79899 Other long term (current) drug therapy: Secondary | ICD-10-CM | POA: Diagnosis not present

## 2016-06-15 DIAGNOSIS — R509 Fever, unspecified: Secondary | ICD-10-CM | POA: Diagnosis not present

## 2016-06-15 DIAGNOSIS — D721 Eosinophilia: Secondary | ICD-10-CM | POA: Diagnosis not present

## 2016-06-15 DIAGNOSIS — Z66 Do not resuscitate: Secondary | ICD-10-CM

## 2016-06-15 DIAGNOSIS — Z5112 Encounter for antineoplastic immunotherapy: Secondary | ICD-10-CM | POA: Insufficient documentation

## 2016-06-15 DIAGNOSIS — Z77090 Contact with and (suspected) exposure to asbestos: Secondary | ICD-10-CM

## 2016-06-15 DIAGNOSIS — M069 Rheumatoid arthritis, unspecified: Secondary | ICD-10-CM | POA: Insufficient documentation

## 2016-06-15 DIAGNOSIS — Z9221 Personal history of antineoplastic chemotherapy: Secondary | ICD-10-CM | POA: Insufficient documentation

## 2016-06-15 DIAGNOSIS — Z801 Family history of malignant neoplasm of trachea, bronchus and lung: Secondary | ICD-10-CM | POA: Insufficient documentation

## 2016-06-15 DIAGNOSIS — G629 Polyneuropathy, unspecified: Secondary | ICD-10-CM | POA: Insufficient documentation

## 2016-06-15 DIAGNOSIS — J690 Pneumonitis due to inhalation of food and vomit: Secondary | ICD-10-CM | POA: Insufficient documentation

## 2016-06-15 DIAGNOSIS — R0600 Dyspnea, unspecified: Secondary | ICD-10-CM | POA: Diagnosis not present

## 2016-06-15 DIAGNOSIS — R05 Cough: Secondary | ICD-10-CM | POA: Diagnosis not present

## 2016-06-15 DIAGNOSIS — J181 Lobar pneumonia, unspecified organism: Secondary | ICD-10-CM

## 2016-06-15 DIAGNOSIS — I493 Ventricular premature depolarization: Secondary | ICD-10-CM | POA: Insufficient documentation

## 2016-06-15 DIAGNOSIS — R634 Abnormal weight loss: Secondary | ICD-10-CM

## 2016-06-15 LAB — COMPREHENSIVE METABOLIC PANEL
ALT: 17 U/L (ref 17–63)
AST: 27 U/L (ref 15–41)
Albumin: 2.6 g/dL — ABNORMAL LOW (ref 3.5–5.0)
Alkaline Phosphatase: 55 U/L (ref 38–126)
Anion gap: 6 (ref 5–15)
BUN: 14 mg/dL (ref 6–20)
CO2: 24 mmol/L (ref 22–32)
Calcium: 8 mg/dL — ABNORMAL LOW (ref 8.9–10.3)
Chloride: 105 mmol/L (ref 101–111)
Creatinine, Ser: 0.75 mg/dL (ref 0.61–1.24)
GFR calc Af Amer: >60 mL/min (ref >60)
GFR calc non Af Amer: >60 mL/min (ref >60)
Glucose, Bld: 166 mg/dL — ABNORMAL HIGH (ref 65–99)
Potassium: 4.1 mmol/L (ref 3.5–5.1)
Sodium: 135 mmol/L (ref 135–145)
Total Bilirubin: 0.7 mg/dL (ref 0.3–1.2)
Total Protein: 6.9 g/dL (ref 6.5–8.1)

## 2016-06-15 LAB — CBC WITH DIFFERENTIAL/PLATELET
Basophils Absolute: 0.1 10*3/uL (ref 0–0.1)
Basophils Relative: 1 %
Eosinophils Absolute: 1 10*3/uL — ABNORMAL HIGH (ref 0–0.7)
Eosinophils Relative: 8 %
HCT: 32.6 % — ABNORMAL LOW (ref 40.0–52.0)
Hemoglobin: 11 g/dL — ABNORMAL LOW (ref 13.0–18.0)
Lymphocytes Relative: 4 %
Lymphs Abs: 0.5 10*3/uL — ABNORMAL LOW (ref 1.0–3.6)
MCH: 31.7 pg (ref 26.0–34.0)
MCHC: 33.6 g/dL (ref 32.0–36.0)
MCV: 94.4 fL (ref 80.0–100.0)
Monocytes Absolute: 1.6 10*3/uL — ABNORMAL HIGH (ref 0.2–1.0)
Monocytes Relative: 11 %
Neutro Abs: 10.4 10*3/uL — ABNORMAL HIGH (ref 1.4–6.5)
Neutrophils Relative %: 76 %
Platelets: 187 10*3/uL (ref 150–440)
RBC: 3.46 MIL/uL — ABNORMAL LOW (ref 4.40–5.90)
RDW: 18.6 % — ABNORMAL HIGH (ref 11.5–14.5)
WBC: 13.6 10*3/uL — ABNORMAL HIGH (ref 3.8–10.6)

## 2016-06-15 MED ORDER — AMOXICILLIN-POT CLAVULANATE 500-125 MG PO TABS: 1.0000 | ORAL_TABLET | Freq: Two times a day (BID) | ORAL | Status: DC

## 2016-06-15 NOTE — Telephone Encounter (Signed)
Patient called to state that he had a fever and was wondering if he should come in and see Dr. Because he probably would not have treatment.  Left message on phone that stated patient should still keep scheduled appointment so that labs could be drawn and he could be seen by Dr.

## 2016-06-15 NOTE — Progress Notes (Signed)
[facility] day:  06/15/2016  Chief Complaint: Gerald [name] is a 64 y.o. male with metastatic lung cancer and associated hypereosinophilia who is seen for assessment prior to initiation of Keytruda.  HPI:  The patient was last seen in the medical oncology clinic on 06/08/2016.  At that time, he had chills and increased cough.  He underwent a fever work-up.  CXR revealed a new apical infiltrate possibly related to a post-obstructive pneumonia.  He was started on Levaquin.  Blood and urine cultures were negative.  Symptomatically, he has continued to have a low grade fever.    Temperate has ranged from 99 to 100.  He describes a dry cough.  He feels weak.  He has been eating better for the past 3 days.  He describes almost exclusively eating vegetables, and "here and there meat".   Past Medical History  Diagnosis Date  . Arthritis     Rheumatoid arthritis  . Pneumothorax     spontaneous  . Renal disorder     as a child  . Mass of lung   . Collagen vascular disease ([place])     RA    Past Surgical History  Procedure Laterality Date  . Ureter repair as a child    . Flexible bronchoscopy N/A 03/30/2016    Procedure: FLEXIBLE BRONCHOSCOPY;  Surgeon: Vilinda Boehringer, MD;  Location: [facility] ORS;  Service: Cardiopulmonary;  Laterality: N/A;    Family History  Problem Relation Age of Onset  . Brain cancer Mother   . Lung cancer Maternal Uncle   . Cancer Sister     Metastatic cancer- unknown primary    Social History:  reports that he quit smoking about 16 years ago. His smoking use included Cigarettes. He has a 13 pack-year smoking history. He does not have any smokeless tobacco history on file. He reports that he drinks alcohol. He reports that he does not use illicit drugs.  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  He will be getting a refrigerator soon.  He lives in [place].  Contact number is  [phone].  The patient is accompanied by his daughter today.  Allergies:  Allergies  Allergen Reactions  . Nsaids Other (See Comments)    Pt is unable to take this class of medications.    . Sulfa Antibiotics Other (See Comments)    Reaction:  GI upset     Current Medications: Current Outpatient Prescriptions  Medication Sig Dispense Refill  . atorvastatin (LIPITOR) 40 MG tablet Take 1 tablet (40 mg total) by mouth daily. 30 tablet 0  . gabapentin (NEURONTIN) 300 MG capsule Take 1 capsule (300 mg total) by mouth at bedtime. 30 capsule 2  . HYDROcodone-acetaminophen (NORCO) 7.5-325 MG tablet Take 1 tablet by mouth every 4 (four) hours as needed for moderate pain (Every 4-6 hours as needed for pain). 30 tablet 0  . levofloxacin (LEVAQUIN) 500 MG tablet Take 1 tablet (500 mg total) by mouth daily. 10 tablet 0  . ondansetron (ZOFRAN) 8 MG tablet Take 1 tablet (8 mg total) by mouth 2 (two) times daily as needed for nausea or vomiting. (Patient not taking: Reported on 06/08/2016) 20 tablet 0  . sucralfate (CARAFATE) 1 g tablet Take 1 tablet (1 g total) by mouth 3 (three) times daily. Dissolve in 2-3 tbsp warm water, swish and swallow. (Patient not taking: Reported on 06/08/2016) 90 tablet  3   No current facility-administered medications for this visit.    Review of Systems:  GENERAL:  Fatigue.  Weak.  Low grade fever (99-100).  No sweats.  Weight down 3 pounds. PERFORMANCE STATUS (ECOG):  1 HEENT: No visual changes, sore throat, mouth sores or tenderness. Lungs:  Shortness of breath on exertion.  Cough, more (clear or white).  Ni hemoptysis. Cardiac:  No chest pain, palpitations, orthopnea, or PND.  Sleeps in a recliner. GI:  Eating better.  No nausea, vomiting, diarrhea, constipation, melena or hematochezia. GU:  No urgency, frequency, dysuria, or hematuria. Musculoskeletal:  Pain in right knee.  No back pain.  No muscle tenderness. Extremities:  No pain or swelling. Skin:  No rashes  or skin changes. Neuro:  No headache, weakness, balance or coordination issues. Endocrine:  No diabetes, thyroid issues, hot flashes or night sweats. Psych:  No mood changes, depression or anxiety. Pain:  Right knee pain.  Right sided chest discomfort.  Review of systems:  All other systems reviewed and found to be negative.  Physical Exam: Blood pressure 104/62, pulse 124, temperature 99.1 F (37.3 C), temperature source Oral, height 5' 11.5" (1.816 m), weight 125 lb 14.1 oz (57.1 kg). GENERAL:  Thin fatigued appearing gentleman sitting comfortably in the exam room in no acute distress. MENTAL STATUS:  Alert and oriented to person, place and time. HEAD:  Wearing a  camo cap.  Long gray hair in pony tail.  Scruffy gray beard.  Temporal wasting.  Normocephalic, atraumatic, face symmetric, no Cushingoid features. EYES:  Blue eyes.  Pupils equal round and reactive to light and accomodation.  No conjunctivitis or scleral icterus. ENT:  Oropharynx clear without lesion.  Tongue normal. Mucous membranes moist.  RESPIRATORY:  Crackles right upper lobe on deep breathing.  Otherwise, clear to auscultation without wheezes or rhonchi. CARDIOVASCULAR:  Regular rate and rhythm without murmur, rub or gallop. ABDOMEN:  Soft, non-tender, with active bowel sounds, and no hepatosplenomegaly.  No masses. SKIN:  No rashes, ulcers or lesions. EXTREMITIES: No edema, no skin discoloration or tenderness.  No palpable cords. LYMPH NODES: No palpable cervical, supraclavicular, axillary or inguinal adenopathy  NEUROLOGICAL: Unremarkable. PSYCH:  Appropriate.    Orders Only on 06/15/2016  Component Date Value Ref Range Status  . WBC 06/15/2016 13.6* 3.8 - 10.6 K/uL Final  . RBC 06/15/2016 3.46* 4.40 - 5.90 MIL/uL Final  . Hemoglobin 06/15/2016 11.0* 13.0 - 18.0 g/dL Final  . HCT 06/15/2016 32.6* 40.0 - 52.0 % Final  . MCV 06/15/2016 94.4  80.0 - 100.0 fL Final  . MCH 06/15/2016 31.7  26.0 - 34.0 pg Final  . MCHC  06/15/2016 33.6  32.0 - 36.0 g/dL Final  . RDW 06/15/2016 18.6* 11.5 - 14.5 % Final  . Platelets 06/15/2016 187  150 - 440 K/uL Final  . Neutrophils Relative % 06/15/2016 76   Final  . Neutro Abs 06/15/2016 10.4* 1.4 - 6.5 K/uL Final  . Lymphocytes Relative 06/15/2016 4   Final  . Lymphs Abs 06/15/2016 0.5* 1.0 - 3.6 K/uL Final  . Monocytes Relative 06/15/2016 11   Final  . Monocytes Absolute 06/15/2016 1.6* 0.2 - 1.0 K/uL Final  . Eosinophils Relative 06/15/2016 8   Final  . Eosinophils Absolute 06/15/2016 1.0* 0 - 0.7 K/uL Final  . Basophils Relative 06/15/2016 1   Final  . Basophils Absolute 06/15/2016 0.1  0 - 0.1 K/uL Final  . Sodium 06/15/2016 135  135 - 145 mmol/L Final  .  Potassium 06/15/2016 4.1  3.5 - 5.1 mmol/L Final  . Chloride 06/15/2016 105  101 - 111 mmol/L Final  . CO2 06/15/2016 24  22 - 32 mmol/L Final  . Glucose, Bld 06/15/2016 166* 65 - 99 mg/dL Final  . BUN 06/15/2016 14  6 - 20 mg/dL Final  . Creatinine, Ser 06/15/2016 0.75  0.61 - 1.24 mg/dL Final  . Calcium 06/15/2016 8.0* 8.9 - 10.3 mg/dL Final  . Total Protein 06/15/2016 6.9  6.5 - 8.1 g/dL Final  . Albumin 06/15/2016 2.6* 3.5 - 5.0 g/dL Final  . AST 06/15/2016 27  15 - 41 U/L Final  . ALT 06/15/2016 17  17 - 63 U/L Final  . Alkaline Phosphatase 06/15/2016 55  38 - 126 U/L Final  . Total Bilirubin 06/15/2016 0.7  0.3 - 1.2 mg/dL Final  . GFR calc non Af Amer 06/15/2016 >60  >60 mL/min Final  . GFR calc Af Amer 06/15/2016 >60  >60 mL/min Final   Comment: (NOTE) The eGFR has been calculated using the CKD EPI equation. This calculation has not been validated in all clinical situations. eGFR's persistently <60 mL/min signify possible Chronic Kidney Disease.   . Anion gap 06/15/2016 6  5 - 15 Final    Assessment:  Gerald [name] is a 65 y.o. male with stage IV non-small cell lung cancer and associated leukocytosis with eosinophilia. He presented with a 30 pound weight loss, shortness of breath, cough, and  left leg discomfort. He has a 13-15 pack year smoking history.  Chest CT angiogram on 03/27/2016 revealed an 8 cm mass in the medial right upper lobe. The mass abutted the mediastinum and possibly invaded the left atrium. There was associated widespread thoracic adenopathy. There was a 2.6 cm enhancing lesion in the medial segment of the left hepatic lobe.  Bronchoscopy on 03/30/2016 revealed an endobronchial lesion in the RUL anterior segment with 95% occlusion. There was extrinsic compression in the right middle lobe secondary to posterior mass effect. Pathology confirmed non-small cell lung cancer, favor adenocarcinoma.  PD-L1 testing revealed high expression (> 50%).  CEA was 2.3 on 03/27/2016.  PET scan on 04/05/2016 revealed intense hypermetabolism (SUV 17.95) associated with a 7.5 cm central perihilar right lung lesion. There was mediastinal (sub-carinal node SUV 8.89; 2.6 cm right paratracheal node SUV 17.49) and upper abdominal retroperitoneal hypermetabolic adenopathy (9 mm aortocaval node SUV 9.2). There were 2 liver metastasis (2.3 cm left lobe SUV 6.19; 2.2 cm segment VIII SUV 5.18). There are multifocal bone metastasis (C2 vertebral body SUV 9.0; 1.6 cm left sacral ala lesion SUV of 8.6).   Head MRI on 03/31/2016 was indeterminate for early metastatic disease to the brain. There was a small focus of gyral enhancement in the anterior right frontal lobe likely is post ischemic, while a small posterior right cerebellar enhancing lesion was more suspicious for small brain metastasis. There were multiple small primarily subacute appearing infarcts in the bilateral MCA and left PICA territories.   Head MRI on 05/12/2016 revealed expected interval evolution of bilateral cerebral in cerebellar infarcts. There was resolution of right frontal and right cerebellar enhancement consistent with subacute infarcts. There was a new 3 mm focus of gyral enhancement in the left occipital lobe and a  possible new 4 mm enhancing lesion in the left cerebellum (? vascular enhancement versus tiny metastasis).   Bone scan on 04/07/2016 revealed a subtle distal right femur lesion which might be a small metastasis.  Bone scan is felt to  be an unreliable modality for evaluation of osseous metastatic disease as the bone metastases seen on the recent PET and MRI were not evident.  He receives [place] monthly (last 05/22/2016).  He has marked leukocytosis with hypereosinophilia. Initial WBC was 72,300 with 53% eosinophils. Bone marrow on 03/29/2016 revealed marked increase in marrow eosinophils (approximate 30%). There was no diagnostic morphologic evidence of a myeloproliferative or lymphoid neoplasm. Flow cytometry revealed significant increase in eosinophils (54%). There was relative decreased myeloid cells with no significant immunophenotypic abnormalities or increase in blasts. There was no lymphoid abnormalities or evidence of clonality. FISH studies were negative for myeloproliferative neoplasms with eosinophilia (PDGFRA, PDGFRB, FGFR1). BCR-ABL was negative on 03/28/2016.  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  Left lower extremity duplex on 03/25/2016 revealed no evidence of DVT. Discomfort improved on Neurontin 300 mg a day and continues to improve with declining eosinophilia.  He received 41.4 Gy from 04/11/2016 - 05/23/2016.  He received 4 weeks of concurrent carboplatin and Taxol (04/17/2016 - 05/22/2016).  He was diagnosed with a right apical pneumonia on 06/08/2016.  He has been on Levaquin x 7 days.     Symptomatically, he feels weak.  He has had persistent low grade fevers.  Cough is dry.  He has lost 3 pounds.  Exam reveals tachycardic.  Code status is DNR/DNI.  Plan: 1.  Labs today:  CBC with diff, CMP. 2.  No treatment today secondary to persistent fever. 3.  CXR (PA and lateral)- f/u apical infiltrate. 4.  EKG today-  tachycardia. 5.  Contact Dr. Antony Haste.  Patient to be seen in follow-up by Dr. Stevenson Clinch. 6.  Rx:  Augmentin 500 mg po q 12 hours.  Begin today. 7.  Complete course of Levaquin. 8.  RTC in 1 week for MD assessment, labs (CBC with diff, CMP), and +/- initiation of Keytruda.   Addendum:   CXR today revealed worsening postobstructive changes in the right upper lobe and right hilar region.  Schedule chest CT.  EKG revealed sinus tachycardia with PACs and aberrant conduction.   Lequita Asal, MD  06/15/2016, 2:22 PM

## 2016-06-15 NOTE — Progress Notes (Signed)
Patient here for pre treatment check and follow up. Patient has been running a low grade fever for about a week. He is currently on ABT for lung infection. Appetite improving.

## 2016-06-15 NOTE — Patient Instructions (Signed)
Megestrol oral suspension What is this medicine? MEGESTROL (me JES trol) suspension improves appetite and helps cause weight gain. It is used in conditions that cause significant loss of appetite and weight, such as AIDS. This medicine may be used for other purposes; ask your health care provider or pharmacist if you have questions. What should I tell my health care provider before I take this medicine? They need to know if you have any of these conditions: -adrenal gland problems -history of blood clots of the legs, lungs, or other parts of the body -diabetes -kidney disease -liver disease -an unusual or allergic reaction to megestrol, other medicines, foods, dyes, or preservatives -pregnant or trying to get pregnant -breast-feeding How should I use this medicine? Take this medicine by mouth. Follow the directions on the prescription label. Shake well before using. Use a specially marked spoon or container to measure your medicine. Ask your pharmacist if you do not have one. Household spoons are not accurate. Take your medicine at regular intervals. Do not take your medicine more often than directed. Do not stop taking except on your doctor's advice. Contact your pediatrician or health care provider regarding the use of this medicine in children. Special care may be needed. Overdosage: If you think you have taken too much of this medicine contact a poison control center or emergency room at once. NOTE: This medicine is only for you. Do not share this medicine with others. What if I miss a dose? If you miss or forget a single dose, continue with your next regularly scheduled dose on the next day. Do not take double or extra doses. What may interact with this medicine? Do not take this medicine with any of the following medications: -dofetilide This medicine may also interact with the following  medications: -carbamazepine -indinavir -phenobarbital -phenytoin -primidone -rifampin -warfarin This list may not describe all possible interactions. Give your health care provider a list of all the medicines, herbs, non-prescription drugs, or dietary supplements you use. Also tell them if you smoke, drink alcohol, or use illegal drugs. Some items may interact with your medicine. What should I watch for while using this medicine? Visit your doctor or health care professional for regular checks on your progress. It may take up to 3 weeks to first notice an improved appetite. It may take up to 12 weeks to notice weight gain. If you are a male of child-bearing age, use an effective method of birth control while you are taking this medicine. This medicine should not be used by females who are pregnant or breast-feeding. There is a potential for serious side effects to an unborn child or to an infant. Talk to your health care professional or pharmacist for more information. This medicine may affect blood sugar levels. If you have diabetes, check with your doctor or health care professional before you change your diet or the dose of your diabetic medicine. What side effects may I notice from receiving this medicine? Side effects that you should report to your doctor or health care professional as soon as possible: -allergic reactions like skin rash, itching or hives, swelling of the face, lips, or tongue -difficulty breathing or shortness of breath -chest pain -dizziness -fluid retention -increased blood pressure -leg pain or swelling -nausea, vomiting -weakness Side effects that usually do not require medical attention (report to your doctor or health care professional if they continue or are bothersome): -breakthrough menstrual bleeding -diarrhea -gas -hot flashes or flushing -sexual difficulties in men This list may  not describe all possible side effects. Call your doctor for medical  advice about side effects. You may report side effects to FDA at 1-800-FDA-1088. Where should I keep my medicine? Keep out of the reach of children. Store at room temperature between 15 and 25 degrees C (59 and 77 degrees F). Keep tightly closed. Protect from heat. Throw away any unused medicine after the expiration date. NOTE: This sheet is a summary. It may not cover all possible information. If you have questions about this medicine, talk to your doctor, pharmacist, or health care provider.    2016, [employer]vier/[employer] Standard. (2008-06-15 15:58:31)

## 2016-06-16 ENCOUNTER — Other Ambulatory Visit: Payer: Self-pay | Admitting: Hematology and Oncology

## 2016-06-16 ENCOUNTER — Ambulatory Visit: Admission: RE | Admit: 2016-06-16 | Payer: Medicare Other | Source: Ambulatory Visit

## 2016-06-16 ENCOUNTER — Ambulatory Visit
Admission: RE | Admit: 2016-06-16 | Discharge: 2016-06-16 | Disposition: A | Discharge status: Home / Self Care | Payer: Medicare Other | Source: Ambulatory Visit | Attending: Hematology and Oncology | Admitting: Hematology and Oncology

## 2016-06-16 ENCOUNTER — Encounter: Payer: Self-pay | Admitting: Pulmonary Disease

## 2016-06-16 ENCOUNTER — Ambulatory Visit (INDEPENDENT_AMBULATORY_CARE_PROVIDER_SITE_OTHER): Payer: Medicare Other | Admitting: Pulmonary Disease

## 2016-06-16 ENCOUNTER — Other Ambulatory Visit: Payer: Self-pay

## 2016-06-16 VITALS — BP 100/50 | HR 114 | Ht 71.0 in | Wt 128.4 lb

## 2016-06-16 DIAGNOSIS — J189 Pneumonia, unspecified organism: Secondary | ICD-10-CM | POA: Diagnosis not present

## 2016-06-16 DIAGNOSIS — I2510 Atherosclerotic heart disease of native coronary artery without angina pectoris: Secondary | ICD-10-CM | POA: Insufficient documentation

## 2016-06-16 DIAGNOSIS — I639 Cerebral infarction, unspecified: Secondary | ICD-10-CM

## 2016-06-16 DIAGNOSIS — C3411 Malignant neoplasm of upper lobe, right bronchus or lung: Secondary | ICD-10-CM

## 2016-06-16 DIAGNOSIS — J439 Emphysema, unspecified: Secondary | ICD-10-CM | POA: Insufficient documentation

## 2016-06-16 DIAGNOSIS — R918 Other nonspecific abnormal finding of lung field: Secondary | ICD-10-CM

## 2016-06-16 HISTORY — DX: Malignant (primary) neoplasm, unspecified: C80.1

## 2016-06-16 MED ORDER — IOPAMIDOL (ISOVUE-300) INJECTION 61%
75.0000 mL | Freq: Once | INTRAVENOUS | Status: AC | PRN
  Administered 2016-06-16: 75 mL via INTRAVENOUS

## 2016-06-16 NOTE — Patient Instructions (Signed)
Agree with Augmentin ordered by Dr Mike Gip Agree with CT scan of chest ordered by Dr Mike Gip Follow up with Dr Stevenson Clinch in 2-3 weeks to review findings of CT scan and discuss further evaluation if needed

## 2016-06-20 ENCOUNTER — Encounter: Payer: Self-pay | Admitting: Pulmonary Disease

## 2016-06-20 NOTE — Progress Notes (Signed)
ACUTE PULMONARY OFFICE VISIT  ACUTE PROBLEM: Suspected PNA  CHRONIC PULMONARY PROBLEMS: Metastatic non small cell lung cancer diagnosed 03/2016  SUBJ: Seen by Dr Mike Gip 06/15/16 with fever and increased RUL infiltrate. Dr Mike Gip spoke with Dr Stevenson Clinch who arranged this follow up. Dr Mike Gip has prescribed Levofloxacin and pt is scheduled for CT chest later on this day. Denies CP, hemoptysis, LE edema and calf tenderness   OBJ: Filed Vitals:   06/16/16 1104  BP: 100/50  Pulse: 114  Height: '5\' 11"'$  (1.803 m)  Weight: 128 lb 6.4 oz (58.242 kg)  SpO2: 92%   NAD Cachectic, disheveled HEENT WNL No JVD, LAN No wheezes or other adventitious sounds RRR s M NABS, soft  CXR: R hilar mass. Increased RUL infiltrate   IMPRESSION: 1) Metastatic non small cell lung cancer 2) Suspected RUL PNA - possible post obstructive. Possible lymphangitic spread of cancer but acute symptoms including fever suggests PNA  PLAN:   Agree with Abx as prescribed Agree with CT chest as ordered ROV 3 weeks with Dr Stevenson Clinch  I have reviewed this patient's medical problems, current medications and therapies and prior pulmonary office notes in evaluation and formulation of the above assessment and plan   Merton Border, MD PCCM service Mobile [phone] Pager [phone] 06/20/2016

## 2016-06-21 ENCOUNTER — Encounter: Payer: Self-pay | Admitting: Radiation Oncology

## 2016-06-21 ENCOUNTER — Ambulatory Visit
Admission: RE | Admit: 2016-06-21 | Discharge: 2016-06-21 | Disposition: A | Discharge status: Home / Self Care | Payer: Medicare Other | Source: Ambulatory Visit | Attending: Radiation Oncology | Admitting: Radiation Oncology

## 2016-06-21 VITALS — BP 88/61 | HR 62 | Temp 96.6°F | Resp 20 | Wt 126.2 lb

## 2016-06-21 DIAGNOSIS — Z923 Personal history of irradiation: Secondary | ICD-10-CM | POA: Diagnosis not present

## 2016-06-21 DIAGNOSIS — C3411 Malignant neoplasm of upper lobe, right bronchus or lung: Secondary | ICD-10-CM | POA: Diagnosis not present

## 2016-06-21 DIAGNOSIS — R918 Other nonspecific abnormal finding of lung field: Secondary | ICD-10-CM | POA: Insufficient documentation

## 2016-06-21 DIAGNOSIS — Z9221 Personal history of antineoplastic chemotherapy: Secondary | ICD-10-CM | POA: Insufficient documentation

## 2016-06-21 NOTE — Progress Notes (Signed)
Radiation Oncology Follow up Note  Name: Gerald [name] [facility]   Date:   06/21/2016 MRN:  [id number] DOB: 01-20-1951    This 65 y.o. male presents to the clinic today for follow-up for at least stage IIIB non-small cell lung cancer of the right upper lobe with SVC.  REFERRING PROVIDER: No ref. provider found  HPI: Patient is a 65 year old male now out 1 month having completed palliative radiation therapy to his right upper lobe for early SVC. He was treated with concurrent carboplatinum and Taxol with palliative dose of radiation therapy. He is seen today one month out and is doing poor. He is scheduled to begin [facility] although his recent CT scan showed possible aspiration pneumonia of the right lung. His hemoptysis is completely resolved he does have slight increased cough.. His recent CT scan of the chest July 7 showed multifocal airspace opacities within the right lung suspicious for postobstructive and/or aspiration pneumonia. There was marked improvement in the right upper lobe mass and hilar adenopathy. He is doing poorly continues to lose weight. Is currently on amoxicillin for his possible aspiration pneumonia.  COMPLICATIONS OF TREATMENT: none  FOLLOW UP COMPLIANCE: keeps appointments   PHYSICAL EXAM:  BP 88/61 mmHg  Pulse 62  Temp(Src) 96.6 F (35.9 C)  Resp 20  Wt 126 lb 3.4 oz (57.25 kg) Thin connective male in NAD. No change in neurologic status. Crude visual fields are within normal range. Cranial nerves II through XII are grossly intact. Lungs are clear. Well-developed well-nourished patient in NAD. HEENT reveals PERLA, EOMI, discs not visualized.  Oral cavity is clear. No oral mucosal lesions are identified. Neck is clear without evidence of cervical or supraclavicular adenopathy. Lungs are clear to A&P. Cardiac examination is essentially unremarkable with regular rate and rhythm without murmur rub or thrill. Abdomen is benign with no organomegaly or masses noted. Motor sensory and  DTR levels are equal and symmetric in the upper and lower extremities. Cranial nerves II through XII are grossly intact. Proprioception is intact. No peripheral adenopathy or edema is identified. No motor or sensory levels are noted. Crude visual fields are within normal range.  RADIOLOGY RESULTS: Recent CT scan is reviewed compatible with the above-stated findings.  PLAN: Present time he is declining. He is under care of medical oncology. Plan is to treat with antibiotics improve his possible pneumonitis and start [place]. At this time of asked to see him back in 3-4 months for follow-up. I would be happy to reevaluate her at any time should further palliative treatment be indicated. I believe we've achieved a good response with palliative radiation to his chest along with concurrent chemotherapy.  I would like to take this opportunity to thank you for allowing me to participate in the care of your patient.Armstead Peaks., MD

## 2016-06-23 ENCOUNTER — Other Ambulatory Visit: Payer: Self-pay | Admitting: Hematology and Oncology

## 2016-06-23 ENCOUNTER — Inpatient Hospital Stay: Payer: Medicare Other

## 2016-06-23 ENCOUNTER — Encounter: Payer: Self-pay | Admitting: Hematology and Oncology

## 2016-06-23 ENCOUNTER — Inpatient Hospital Stay (HOSPITAL_BASED_OUTPATIENT_CLINIC_OR_DEPARTMENT_OTHER): Payer: Medicare Other | Admitting: Hematology and Oncology

## 2016-06-23 VITALS — BP 87/57 | HR 108 | Temp 98.1°F | Resp 18 | Wt 127.4 lb

## 2016-06-23 DIAGNOSIS — Z808 Family history of malignant neoplasm of other organs or systems: Secondary | ICD-10-CM

## 2016-06-23 DIAGNOSIS — Z66 Do not resuscitate: Secondary | ICD-10-CM

## 2016-06-23 DIAGNOSIS — G939 Disorder of brain, unspecified: Secondary | ICD-10-CM

## 2016-06-23 DIAGNOSIS — Z5112 Encounter for antineoplastic immunotherapy: Secondary | ICD-10-CM | POA: Diagnosis not present

## 2016-06-23 DIAGNOSIS — C3411 Malignant neoplasm of upper lobe, right bronchus or lung: Secondary | ICD-10-CM | POA: Diagnosis not present

## 2016-06-23 DIAGNOSIS — Z77090 Contact with and (suspected) exposure to asbestos: Secondary | ICD-10-CM

## 2016-06-23 DIAGNOSIS — C7951 Secondary malignant neoplasm of bone: Secondary | ICD-10-CM | POA: Diagnosis not present

## 2016-06-23 DIAGNOSIS — Z801 Family history of malignant neoplasm of trachea, bronchus and lung: Secondary | ICD-10-CM

## 2016-06-23 DIAGNOSIS — Z87891 Personal history of nicotine dependence: Secondary | ICD-10-CM

## 2016-06-23 DIAGNOSIS — I998 Other disorder of circulatory system: Secondary | ICD-10-CM

## 2016-06-23 DIAGNOSIS — Z79899 Other long term (current) drug therapy: Secondary | ICD-10-CM

## 2016-06-23 DIAGNOSIS — J690 Pneumonitis due to inhalation of food and vomit: Secondary | ICD-10-CM | POA: Diagnosis not present

## 2016-06-23 DIAGNOSIS — R000 Tachycardia, unspecified: Secondary | ICD-10-CM

## 2016-06-23 DIAGNOSIS — G629 Polyneuropathy, unspecified: Secondary | ICD-10-CM | POA: Diagnosis not present

## 2016-06-23 DIAGNOSIS — C787 Secondary malignant neoplasm of liver and intrahepatic bile duct: Secondary | ICD-10-CM

## 2016-06-23 DIAGNOSIS — D721 Eosinophilia: Secondary | ICD-10-CM

## 2016-06-23 DIAGNOSIS — M069 Rheumatoid arthritis, unspecified: Secondary | ICD-10-CM

## 2016-06-23 DIAGNOSIS — R509 Fever, unspecified: Secondary | ICD-10-CM

## 2016-06-23 DIAGNOSIS — R590 Localized enlarged lymph nodes: Secondary | ICD-10-CM

## 2016-06-23 DIAGNOSIS — Z809 Family history of malignant neoplasm, unspecified: Secondary | ICD-10-CM

## 2016-06-23 DIAGNOSIS — D72829 Elevated white blood cell count, unspecified: Secondary | ICD-10-CM

## 2016-06-23 DIAGNOSIS — Z9221 Personal history of antineoplastic chemotherapy: Secondary | ICD-10-CM

## 2016-06-23 DIAGNOSIS — N2889 Other specified disorders of kidney and ureter: Secondary | ICD-10-CM

## 2016-06-23 DIAGNOSIS — K769 Liver disease, unspecified: Secondary | ICD-10-CM

## 2016-06-23 DIAGNOSIS — Z8701 Personal history of pneumonia (recurrent): Secondary | ICD-10-CM

## 2016-06-23 LAB — CBC WITH DIFFERENTIAL/PLATELET
Basophils Absolute: 0.2 10*3/uL — ABNORMAL HIGH (ref 0–0.1)
Basophils Relative: 2 %
Eosinophils Absolute: 0.8 10*3/uL — ABNORMAL HIGH (ref 0–0.7)
Eosinophils Relative: 9 %
HCT: 31.5 % — ABNORMAL LOW (ref 40.0–52.0)
Hemoglobin: 10.6 g/dL — ABNORMAL LOW (ref 13.0–18.0)
Lymphocytes Relative: 9 %
Lymphs Abs: 0.8 10*3/uL — ABNORMAL LOW (ref 1.0–3.6)
MCH: 31.9 pg (ref 26.0–34.0)
MCHC: 33.5 g/dL (ref 32.0–36.0)
MCV: 95 fL (ref 80.0–100.0)
Monocytes Absolute: 1 10*3/uL (ref 0.2–1.0)
Monocytes Relative: 13 %
Neutro Abs: 5.4 10*3/uL (ref 1.4–6.5)
Neutrophils Relative %: 67 %
Platelets: 326 10*3/uL (ref 150–440)
RBC: 3.31 MIL/uL — ABNORMAL LOW (ref 4.40–5.90)
RDW: 19.1 % — ABNORMAL HIGH (ref 11.5–14.5)
WBC: 8.1 10*3/uL (ref 3.8–10.6)

## 2016-06-23 LAB — COMPREHENSIVE METABOLIC PANEL
ALT: 22 U/L (ref 17–63)
AST: 39 U/L (ref 15–41)
Albumin: 2.3 g/dL — ABNORMAL LOW (ref 3.5–5.0)
Alkaline Phosphatase: 89 U/L (ref 38–126)
Anion gap: 2 — ABNORMAL LOW (ref 5–15)
BUN: 11 mg/dL (ref 6–20)
CO2: 25 mmol/L (ref 22–32)
Calcium: 8 mg/dL — ABNORMAL LOW (ref 8.9–10.3)
Chloride: 108 mmol/L (ref 101–111)
Creatinine, Ser: 0.62 mg/dL (ref 0.61–1.24)
GFR calc Af Amer: >60 mL/min (ref >60)
GFR calc non Af Amer: >60 mL/min (ref >60)
Glucose, Bld: 113 mg/dL — ABNORMAL HIGH (ref 65–99)
Potassium: 4 mmol/L (ref 3.5–5.1)
Sodium: 135 mmol/L (ref 135–145)
Total Bilirubin: 0.7 mg/dL (ref 0.3–1.2)
Total Protein: 6.9 g/dL (ref 6.5–8.1)

## 2016-06-23 MED ORDER — DENOSUMAB 120 MG/1.7ML ~~LOC~~ SOLN
120.0000 mg | Freq: Once | SUBCUTANEOUS | Status: AC
  Administered 2016-06-23: 120 mg via SUBCUTANEOUS
  Filled 2016-06-23: qty 1.7

## 2016-06-23 NOTE — Progress Notes (Signed)
Patient is here for follow up, he notes a headache and pain on the right side of his rib cage. Does get SOB with exertion.

## 2016-06-23 NOTE — Progress Notes (Signed)
[facility] day:  06/23/2016  Chief Complaint: Gerald [name] is a 65 y.o. male with metastatic lung cancer and associated hypereosinophilia who is seen for assessment prior to initiation of Keytruda.  HPI:  The patient was last seen in the medical oncology clinic on 06/15/2016.  At that time,  he felt weak.  He had persistent low grade fevers despite being on Levaquin for a week..  Cough was dry.  CXR on 06/15/2016 revealed worsening postobstructive changes in the RUL and right perihilar region.  He was started on Levaquin grams by mouth twice a day.  Chest CT was ordered.  He was to follow-up with pulmonary medicine.  Chest CT on 06/16/2016 revealed multifocal airspace opacities within the right lung which have progressed since the prior CT, suspicious for postobstructive and/or aspiration pneumonia. There was a cavitary component in the right upper lobe posteriorly.  The dominant right upper lobe mass and the hilar adenopathy had improved.  He saw Dr. Cheral Marker on 06/20/2016.  No changes were made in his treatment. He was scheduled for follow-up with Dr. Stevenson Clinch in 3 weeks.  He did not begin his Augmentin until 06/19/2016.  He continued to have low-grade fevers until 06/20/2016.  Symptomatically, he feels a little better. He denies any chills. Cough is dry.  He remembers choking on a piece of roast prior to his pneumonia.  His appetite has improved as he is feeling better.   Past Medical History  Diagnosis Date  . Arthritis     Rheumatoid arthritis  . Pneumothorax     spontaneous  . Renal disorder     as a child  . Mass of lung   . Collagen vascular disease (HCC)     RA  . Cancer [facility])     lung ca     Past Surgical History  Procedure Laterality Date  . Ureter repair as a child    . Flexible bronchoscopy N/A 03/30/2016    Procedure: FLEXIBLE BRONCHOSCOPY;  Surgeon: Vilinda Boehringer, MD;  Location: [facility] ORS;  Service: Cardiopulmonary;   Laterality: N/A;    Family History  Problem Relation Age of Onset  . Brain cancer Mother   . Lung cancer Maternal Uncle   . Cancer Sister     Metastatic cancer- unknown primary    Social History:  reports that he quit smoking about 16 years ago. His smoking use included Cigarettes. He has a 13 pack-year smoking history. He does not have any smokeless tobacco history on file. He reports that he drinks alcohol. He reports that he does not use illicit drugs.  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  He will be getting a refrigerator soon.  He lives in [place].  Contact number is [phone].  The patient is alone today.  Allergies:  Allergies  Allergen Reactions  . Nsaids Other (See Comments)    Pt is unable to take this class of medications.    . Sulfa Antibiotics Other (See Comments)    Reaction:  GI upset     Current Medications: Current Outpatient Prescriptions  Medication Sig Dispense Refill  . amoxicillin-clavulanate (AUGMENTIN) 500-125 MG tablet Take 1 tablet (500 mg total) by mouth 2 (two) times daily. 20 tablet 0  . atorvastatin (LIPITOR) 40 MG tablet Take 1 tablet (40 mg total) by mouth daily. 30 tablet 0  . gabapentin (NEURONTIN) 300 MG capsule Take  1 capsule (300 mg total) by mouth at bedtime. 30 capsule 2  . HYDROcodone-acetaminophen (NORCO) 7.5-325 MG tablet Take 1 tablet by mouth every 4 (four) hours as needed for moderate pain (Every 4-6 hours as needed for pain). 30 tablet 0  . levofloxacin (LEVAQUIN) 500 MG tablet Take 1 tablet (500 mg total) by mouth daily. 10 tablet 0  . ondansetron (ZOFRAN) 8 MG tablet Take 1 tablet (8 mg total) by mouth 2 (two) times daily as needed for nausea or vomiting. 20 tablet 0  . sucralfate (CARAFATE) 1 g tablet Take 1 tablet (1 g total) by mouth 3 (three) times daily. Dissolve in 2-3 tbsp warm water, swish and swallow. 90 tablet 3   No current facility-administered medications for  this visit.    Review of Systems:  GENERAL:  Feeling better.  No fever since 06/20/2016.  No chills or sweats.  Weight up 2 pounds. PERFORMANCE STATUS (ECOG):  2 HEENT: No visual changes, sore throat, mouth sores or tenderness. Lungs:  Shortness of breath on exertion.  Dry cough.  No hemoptysis. Cardiac:  No chest pain, palpitations, orthopnea, or PND.  Sleeps in a recliner. GI:  Appetite improved.  No nausea, vomiting, diarrhea, constipation, melena or hematochezia. GU:  No urgency, frequency, dysuria, or hematuria. Musculoskeletal:  Pain in right knee.  No back pain.  No muscle tenderness. Extremities:  No pain or swelling. Skin:  No rashes or skin changes. Neuro:  No headache, weakness, balance or coordination issues. Endocrine:  No diabetes, thyroid issues, hot flashes or night sweats. Psych:  No mood changes, depression or anxiety. Pain:  Right knee pain.  Right sided chest discomfort.  Review of systems:  All other systems reviewed and found to be negative.  Physical Exam: Blood pressure 87/57, pulse 108, temperature 98.1 F (36.7 C), temperature source Tympanic, resp. rate 18, weight 127 lb 6.8 oz (57.8 kg). GENERAL:  Thin gentleman sitting comfortably in the exam room in no acute distress. MENTAL STATUS:  Alert and oriented to person, place and time. HEAD:  Wearing a  camo cap.  Long gray hair in pony tail.  Scruffy gray beard.  Temporal wasting.  Normocephalic, atraumatic, face symmetric, no Cushingoid features. EYES:  Blue eyes.  Pupils equal round and reactive to light and accomodation.  No conjunctivitis or scleral icterus. ENT:  Oropharynx clear without lesion.  Tongue normal. Mucous membranes moist.  RESPIRATORY:  Clear to auscultation without rales, wheezes or rhonchi. CARDIOVASCULAR:  Regular rate and rhythm without murmur, rub or gallop. ABDOMEN:  Soft, non-tender, with active bowel sounds, and no hepatosplenomegaly.  No masses. SKIN:  No rashes, ulcers or  lesions. EXTREMITIES: No edema, no skin discoloration or tenderness.  No palpable cords. LYMPH NODES: No palpable cervical, supraclavicular, axillary or inguinal adenopathy  NEUROLOGICAL: Unremarkable. PSYCH:  Appropriate.    Appointment on 06/23/2016  Component Date Value Ref Range Status  . WBC 06/23/2016 8.1  3.8 - 10.6 K/uL Final  . RBC 06/23/2016 3.31* 4.40 - 5.90 MIL/uL Final  . Hemoglobin 06/23/2016 10.6* 13.0 - 18.0 g/dL Final  . HCT 06/23/2016 31.5* 40.0 - 52.0 % Final  . MCV 06/23/2016 95.0  80.0 - 100.0 fL Final  . MCH 06/23/2016 31.9  26.0 - 34.0 pg Final  . MCHC 06/23/2016 33.5  32.0 - 36.0 g/dL Final  . RDW 06/23/2016 19.1* 11.5 - 14.5 % Final  . Platelets 06/23/2016 326  150 - 440 K/uL Final  . Neutrophils Relative % 06/23/2016 67  Final  . Neutro Abs 06/23/2016 5.4  1.4 - 6.5 K/uL Final  . Lymphocytes Relative 06/23/2016 9   Final  . Lymphs Abs 06/23/2016 0.8* 1.0 - 3.6 K/uL Final  . Monocytes Relative 06/23/2016 13   Final  . Monocytes Absolute 06/23/2016 1.0  0.2 - 1.0 K/uL Final  . Eosinophils Relative 06/23/2016 9   Final  . Eosinophils Absolute 06/23/2016 0.8* 0 - 0.7 K/uL Final  . Basophils Relative 06/23/2016 2   Final  . Basophils Absolute 06/23/2016 0.2* 0 - 0.1 K/uL Final  . Sodium 06/23/2016 135  135 - 145 mmol/L Final   LYTES REPEATED   . Potassium 06/23/2016 4.0  3.5 - 5.1 mmol/L Final  . Chloride 06/23/2016 108  101 - 111 mmol/L Final  . CO2 06/23/2016 25  22 - 32 mmol/L Final  . Glucose, Bld 06/23/2016 113* 65 - 99 mg/dL Final  . BUN 06/23/2016 11  6 - 20 mg/dL Final  . Creatinine, Ser 06/23/2016 0.62  0.61 - 1.24 mg/dL Final  . Calcium 06/23/2016 8.0* 8.9 - 10.3 mg/dL Final  . Total Protein 06/23/2016 6.9  6.5 - 8.1 g/dL Final  . Albumin 06/23/2016 2.3* 3.5 - 5.0 g/dL Final  . AST 06/23/2016 39  15 - 41 U/L Final  . ALT 06/23/2016 22  17 - 63 U/L Final  . Alkaline Phosphatase 06/23/2016 89  38 - 126 U/L Final  . Total Bilirubin 06/23/2016 0.7   0.3 - 1.2 mg/dL Final  . GFR calc non Af Amer 06/23/2016 >60  >60 mL/min Final  . GFR calc Af Amer 06/23/2016 >60  >60 mL/min Final   Comment: (NOTE) The eGFR has been calculated using the CKD EPI equation. This calculation has not been validated in all clinical situations. eGFR's persistently <60 mL/min signify possible Chronic Kidney Disease.   . Anion gap 06/23/2016 2* 5 - 15 Final    Assessment:  Gerald [name] is a 65 y.o. male with stage IV non-small cell lung cancer and associated leukocytosis with eosinophilia. He presented with a 30 pound weight loss, shortness of breath, cough, and left leg discomfort. He has a 13-15 pack year smoking history.  Chest CT angiogram on 03/27/2016 revealed an 8 cm mass in the medial right upper lobe. The mass abutted the mediastinum and possibly invaded the left atrium. There was associated widespread thoracic adenopathy. There was a 2.6 cm enhancing lesion in the medial segment of the left hepatic lobe.  Bronchoscopy on 03/30/2016 revealed an endobronchial lesion in the RUL anterior segment with 95% occlusion. There was extrinsic compression in the right middle lobe secondary to posterior mass effect. Pathology confirmed non-small cell lung cancer, favor adenocarcinoma.  PD-L1 testing revealed high expression (> 50%).  CEA was 2.3 on 03/27/2016.  PET scan on 04/05/2016 revealed intense hypermetabolism (SUV 17.95) associated with a 7.5 cm central perihilar right lung lesion. There was mediastinal (sub-carinal node SUV 8.89; 2.6 cm right paratracheal node SUV 17.49) and upper abdominal retroperitoneal hypermetabolic adenopathy (9 mm aortocaval node SUV 9.2). There were 2 liver metastasis (2.3 cm left lobe SUV 6.19; 2.2 cm segment VIII SUV 5.18). There are multifocal bone metastasis (C2 vertebral body SUV 9.0; 1.6 cm left sacral ala lesion SUV of 8.6).   Head MRI on 03/31/2016 was indeterminate for early metastatic disease to the brain. There was a  small focus of gyral enhancement in the anterior right frontal lobe likely is post ischemic, while a small posterior right cerebellar enhancing lesion  was more suspicious for small brain metastasis. There were multiple small primarily subacute appearing infarcts in the bilateral MCA and left PICA territories.   Head MRI on 05/12/2016 revealed expected interval evolution of bilateral cerebral in cerebellar infarcts. There was resolution of right frontal and right cerebellar enhancement consistent with subacute infarcts. There was a new 3 mm focus of gyral enhancement in the left occipital lobe and a possible new 4 mm enhancing lesion in the left cerebellum (? vascular enhancement versus tiny metastasis).   Bone scan on 04/07/2016 revealed a subtle distal right femur lesion which might be a small metastasis.  Bone scan is felt to be an unreliable modality for evaluation of osseous metastatic disease as the bone metastases seen on the recent PET and MRI were not evident.  He receives [place] monthly (last 05/22/2016).  He has marked leukocytosis with hypereosinophilia. Initial WBC was 72,300 with 53% eosinophils. Bone marrow on 03/29/2016 revealed marked increase in marrow eosinophils (approximate 30%). There was no diagnostic morphologic evidence of a myeloproliferative or lymphoid neoplasm. Flow cytometry revealed significant increase in eosinophils (54%). There was relative decreased myeloid cells with no significant immunophenotypic abnormalities or increase in blasts. There was no lymphoid abnormalities or evidence of clonality. FISH studies were negative for myeloproliferative neoplasms with eosinophilia (PDGFRA, PDGFRB, FGFR1). BCR-ABL was negative on 03/28/2016.  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  Left lower extremity duplex on 03/25/2016 revealed no evidence of DVT. Discomfort improved on Neurontin 300 mg a day and continues to  improve with declining eosinophilia.  He received 41.4 Gy from 04/11/2016 - 05/23/2016.  He received 4 weeks of concurrent carboplatin and Taxol (04/17/2016 - 05/22/2016).  He was diagnosed with aspiration pneumonia.  Chest CT on 06/16/2016 revealed multifocal airspace opacities within the right lung which progressed since the prior CT, suspicious for postobstructive and/or aspiration pneumonia. There was a cavitary component in the right upper lobe posteriorly.  The dominant right upper lobe mass and the hilar adenopathy had improved.  He received 10 days of Levaquin.  He is currently on day 5 of Augmentin (began 06/19/2016).  Symptomatically, he feels better.  His fever resolved on 06/20/2016.  He has gained weight.  Exam is stable.  Code status is DNR/DNI.  Plan: 1.  Labs today:  CBC with diff, CMP. 2.  Review images with patient.  Discuss probable aspiration event (patient remembers choking on roast).  Discuss completion of antibiotics prior to initiation of therapy.  Discuss improving adenopathy. 3.  Complete Augmentin course. 4.  Xgeva today. 5.  CXR on 07/03/2016 prior to appt- f/u pneumonia 6.  RTC on 07/03/2016 for MD assess, labs (CBC with diff, CMP, Mg) and cycle #1 Rivka Barbara, MD  06/23/2016, 2:29 PM

## 2016-06-26 ENCOUNTER — Ambulatory Visit: Payer: Medicare Other

## 2016-06-26 ENCOUNTER — Other Ambulatory Visit: Payer: Self-pay | Admitting: *Deleted

## 2016-06-26 DIAGNOSIS — C7951 Secondary malignant neoplasm of bone: Secondary | ICD-10-CM

## 2016-06-26 DIAGNOSIS — C3411 Malignant neoplasm of upper lobe, right bronchus or lung: Secondary | ICD-10-CM

## 2016-07-01 ENCOUNTER — Other Ambulatory Visit: Payer: Self-pay | Admitting: Hematology and Oncology

## 2016-07-03 ENCOUNTER — Ambulatory Visit
Admission: RE | Admit: 2016-07-03 | Discharge: 2016-07-03 | Disposition: A | Discharge status: Home / Self Care | Payer: Medicare Other | Source: Ambulatory Visit | Attending: Hematology and Oncology | Admitting: Hematology and Oncology

## 2016-07-03 ENCOUNTER — Inpatient Hospital Stay (HOSPITAL_BASED_OUTPATIENT_CLINIC_OR_DEPARTMENT_OTHER): Payer: Medicare Other | Admitting: Hematology and Oncology

## 2016-07-03 ENCOUNTER — Other Ambulatory Visit: Payer: Self-pay | Admitting: *Deleted

## 2016-07-03 ENCOUNTER — Inpatient Hospital Stay: Payer: Medicare Other

## 2016-07-03 VITALS — BP 108/65 | HR 118 | Temp 99.5°F | Resp 18 | Wt 123.5 lb

## 2016-07-03 DIAGNOSIS — Z5112 Encounter for antineoplastic immunotherapy: Secondary | ICD-10-CM | POA: Diagnosis not present

## 2016-07-03 DIAGNOSIS — C787 Secondary malignant neoplasm of liver and intrahepatic bile duct: Secondary | ICD-10-CM | POA: Diagnosis not present

## 2016-07-03 DIAGNOSIS — R000 Tachycardia, unspecified: Secondary | ICD-10-CM

## 2016-07-03 DIAGNOSIS — C3411 Malignant neoplasm of upper lobe, right bronchus or lung: Secondary | ICD-10-CM | POA: Diagnosis not present

## 2016-07-03 DIAGNOSIS — D72829 Elevated white blood cell count, unspecified: Secondary | ICD-10-CM

## 2016-07-03 DIAGNOSIS — C7951 Secondary malignant neoplasm of bone: Secondary | ICD-10-CM

## 2016-07-03 DIAGNOSIS — R918 Other nonspecific abnormal finding of lung field: Secondary | ICD-10-CM | POA: Insufficient documentation

## 2016-07-03 DIAGNOSIS — Z8701 Personal history of pneumonia (recurrent): Secondary | ICD-10-CM

## 2016-07-03 DIAGNOSIS — J690 Pneumonitis due to inhalation of food and vomit: Secondary | ICD-10-CM

## 2016-07-03 DIAGNOSIS — R509 Fever, unspecified: Secondary | ICD-10-CM

## 2016-07-03 DIAGNOSIS — Z77090 Contact with and (suspected) exposure to asbestos: Secondary | ICD-10-CM

## 2016-07-03 DIAGNOSIS — N2889 Other specified disorders of kidney and ureter: Secondary | ICD-10-CM

## 2016-07-03 DIAGNOSIS — Z809 Family history of malignant neoplasm, unspecified: Secondary | ICD-10-CM

## 2016-07-03 DIAGNOSIS — R079 Chest pain, unspecified: Secondary | ICD-10-CM | POA: Diagnosis not present

## 2016-07-03 DIAGNOSIS — Z9221 Personal history of antineoplastic chemotherapy: Secondary | ICD-10-CM

## 2016-07-03 DIAGNOSIS — Z808 Family history of malignant neoplasm of other organs or systems: Secondary | ICD-10-CM

## 2016-07-03 DIAGNOSIS — G629 Polyneuropathy, unspecified: Secondary | ICD-10-CM | POA: Diagnosis not present

## 2016-07-03 DIAGNOSIS — D721 Eosinophilia: Secondary | ICD-10-CM

## 2016-07-03 DIAGNOSIS — Z801 Family history of malignant neoplasm of trachea, bronchus and lung: Secondary | ICD-10-CM

## 2016-07-03 DIAGNOSIS — M069 Rheumatoid arthritis, unspecified: Secondary | ICD-10-CM

## 2016-07-03 DIAGNOSIS — K769 Liver disease, unspecified: Secondary | ICD-10-CM

## 2016-07-03 DIAGNOSIS — I998 Other disorder of circulatory system: Secondary | ICD-10-CM

## 2016-07-03 DIAGNOSIS — R590 Localized enlarged lymph nodes: Secondary | ICD-10-CM

## 2016-07-03 DIAGNOSIS — Z66 Do not resuscitate: Secondary | ICD-10-CM

## 2016-07-03 DIAGNOSIS — G939 Disorder of brain, unspecified: Secondary | ICD-10-CM

## 2016-07-03 DIAGNOSIS — Z79899 Other long term (current) drug therapy: Secondary | ICD-10-CM

## 2016-07-03 DIAGNOSIS — Z87891 Personal history of nicotine dependence: Secondary | ICD-10-CM

## 2016-07-03 LAB — COMPREHENSIVE METABOLIC PANEL
ALT: 24 U/L (ref 17–63)
AST: 44 U/L — ABNORMAL HIGH (ref 15–41)
Albumin: 2.6 g/dL — ABNORMAL LOW (ref 3.5–5.0)
Alkaline Phosphatase: 92 U/L (ref 38–126)
Anion gap: 8 (ref 5–15)
BUN: 13 mg/dL (ref 6–20)
CO2: 21 mmol/L — ABNORMAL LOW (ref 22–32)
Calcium: 7.9 mg/dL — ABNORMAL LOW (ref 8.9–10.3)
Chloride: 107 mmol/L (ref 101–111)
Creatinine, Ser: 0.66 mg/dL (ref 0.61–1.24)
GFR calc Af Amer: >60 mL/min (ref >60)
GFR calc non Af Amer: >60 mL/min (ref >60)
Glucose, Bld: 98 mg/dL (ref 65–99)
Potassium: 3.7 mmol/L (ref 3.5–5.1)
Sodium: 136 mmol/L (ref 135–145)
Total Bilirubin: 0.7 mg/dL (ref 0.3–1.2)
Total Protein: 7.5 g/dL (ref 6.5–8.1)

## 2016-07-03 LAB — CBC WITH DIFFERENTIAL/PLATELET
Basophils Absolute: 0.1 10*3/uL (ref 0–0.1)
Basophils Relative: 2 %
Eosinophils Absolute: 0.7 10*3/uL (ref 0–0.7)
Eosinophils Relative: 12 %
HCT: 33.6 % — ABNORMAL LOW (ref 40.0–52.0)
Hemoglobin: 10.9 g/dL — ABNORMAL LOW (ref 13.0–18.0)
Lymphocytes Relative: 16 %
Lymphs Abs: 0.9 10*3/uL — ABNORMAL LOW (ref 1.0–3.6)
MCH: 31.6 pg (ref 26.0–34.0)
MCHC: 32.6 g/dL (ref 32.0–36.0)
MCV: 97.1 fL (ref 80.0–100.0)
Monocytes Absolute: 1.1 10*3/uL — ABNORMAL HIGH (ref 0.2–1.0)
Monocytes Relative: 19 %
Neutro Abs: 3 10*3/uL (ref 1.4–6.5)
Neutrophils Relative %: 51 %
Platelets: 318 10*3/uL (ref 150–440)
RBC: 3.46 MIL/uL — ABNORMAL LOW (ref 4.40–5.90)
RDW: 20.4 % — ABNORMAL HIGH (ref 11.5–14.5)
WBC: 5.8 10*3/uL (ref 3.8–10.6)

## 2016-07-03 LAB — MAGNESIUM: Magnesium: 1.7 mg/dL (ref 1.7–2.4)

## 2016-07-03 MED ORDER — SODIUM CHLORIDE 0.9 % IV SOLN
Freq: Once | INTRAVENOUS | Status: DC | PRN
  Filled 2016-07-03: qty 1000

## 2016-07-03 MED ORDER — EPINEPHRINE HCL 0.1 MG/ML IJ SOSY
0.2500 mg | PREFILLED_SYRINGE | Freq: Once | INTRAMUSCULAR | Status: DC | PRN
Start: 2016-07-03 — End: 2016-07-03
  Filled 2016-07-03: qty 10

## 2016-07-03 MED ORDER — ALBUTEROL SULFATE (2.5 MG/3ML) 0.083% IN NEBU: 2.5000 mg | INHALATION_SOLUTION | Freq: Once | RESPIRATORY_TRACT | Status: DC | PRN

## 2016-07-03 MED ORDER — GABAPENTIN 300 MG PO CAPS: 300.0000 mg | ORAL_CAPSULE | Freq: Every day | ORAL | 2 refills | Status: DC

## 2016-07-03 MED ORDER — FAMOTIDINE IN NACL 20-0.9 MG/50ML-% IV SOLN: 20.0000 mg | Freq: Once | INTRAVENOUS | Status: DC | PRN

## 2016-07-03 MED ORDER — EPINEPHRINE HCL 1 MG/ML IJ SOLN
0.5000 mg | Freq: Once | INTRAMUSCULAR | Status: AC | PRN
  Filled 2016-07-03: qty 1

## 2016-07-03 MED ORDER — HYDROCODONE-ACETAMINOPHEN 7.5-325 MG PO TABS: 1.0000 | ORAL_TABLET | ORAL | 0 refills | Status: DC | PRN

## 2016-07-03 MED ORDER — HEPARIN SOD (PORK) LOCK FLUSH 100 UNIT/ML IV SOLN: 250.0000 [IU] | Freq: Once | INTRAVENOUS | Status: AC | PRN

## 2016-07-03 MED ORDER — METHYLPREDNISOLONE SODIUM SUCC 125 MG IJ SOLR: 125.0000 mg | Freq: Once | INTRAMUSCULAR | Status: DC | PRN

## 2016-07-03 MED ORDER — EPINEPHRINE HCL 0.1 MG/ML IJ SOSY
0.2500 mg | PREFILLED_SYRINGE | Freq: Once | INTRAMUSCULAR | Status: DC | PRN
  Filled 2016-07-03: qty 10

## 2016-07-03 MED ORDER — SODIUM CHLORIDE 0.9% FLUSH
3.0000 mL | INTRAVENOUS | Status: DC | PRN
  Filled 2016-07-03: qty 3

## 2016-07-03 MED ORDER — DIPHENHYDRAMINE HCL 50 MG/ML IJ SOLN: 50.0000 mg | Freq: Once | INTRAMUSCULAR | Status: AC | PRN

## 2016-07-03 MED ORDER — SODIUM CHLORIDE 0.9 % IV SOLN
200.0000 mg | Freq: Once | INTRAVENOUS | Status: AC
  Administered 2016-07-03: 200 mg via INTRAVENOUS
  Filled 2016-07-03: qty 8

## 2016-07-03 MED ORDER — HEPARIN SOD (PORK) LOCK FLUSH 100 UNIT/ML IV SOLN: 500.0000 [IU] | Freq: Once | INTRAVENOUS | Status: DC | PRN

## 2016-07-03 MED ORDER — SODIUM CHLORIDE 0.9 % IV SOLN
Freq: Once | INTRAVENOUS | Status: AC
  Administered 2016-07-03: 16:00:00 via INTRAVENOUS
  Filled 2016-07-03: qty 1000

## 2016-07-03 MED ORDER — ALTEPLASE 2 MG IJ SOLR: 2.0000 mg | Freq: Once | INTRAMUSCULAR | Status: DC | PRN

## 2016-07-03 MED ORDER — DIPHENHYDRAMINE HCL 50 MG/ML IJ SOLN: 25.0000 mg | Freq: Once | INTRAMUSCULAR | Status: DC | PRN

## 2016-07-03 MED ORDER — SODIUM CHLORIDE 0.9% FLUSH
10.0000 mL | INTRAVENOUS | Status: DC | PRN
  Filled 2016-07-03: qty 10

## 2016-07-03 NOTE — Progress Notes (Signed)
While pt was in clinic hiss daughter came over from infusion area and sais he needed refill of hydorcodone and gabapentin and I got verbal approval from [facility] and sent gabapentin electronically and printed rx for pain med and [place] signed and I gave refill to pt.

## 2016-07-03 NOTE — Progress Notes (Signed)
Patient is here for follow up, no complaints. He just finished two rounds of ABX and feels better but has a low grade fever today.

## 2016-07-03 NOTE — Progress Notes (Signed)
[facility] day:  07/03/16  Chief Complaint: Gerald [name] is a 65 y.o. male with metastatic lung cancer and associated hypereosinophilia who is seen for assessment prior to initiation of Keytruda.  HPI:  The patient was last seen in the medical oncology clinic on 06/23/2016.  At that time, he was feeling better.  His fever had resolved.  He was gaining weight.  Exam was stable. He received Xgeva.  CXR (PA and lateral) today reveals persistent right perihilar mass. There is persistent increased interstitial  density surrounding the mass consistent with postobstructive atelectasis. There has been interval improvement in the appearance of the density in the right upper lobe.  During the interim, he has done well.  He states that he feels "ok".  He completed Augmentin.  He stats that he "still gets short of breath getting up and doing things".  He cut the grass this weekend (rode his riding lawnmower).  He states that he is eating a lot more.  Overall, he feels a lot better.   Past Medical History:  Diagnosis Date  . Arthritis    Rheumatoid arthritis  . Cancer ([place])    lung ca   . Collagen vascular disease ([facility])    RA  . Mass of lung   . Pneumothorax    spontaneous  . Renal disorder    as a child    Past Surgical History:  Procedure Laterality Date  . FLEXIBLE BRONCHOSCOPY N/A 03/30/2016   Procedure: FLEXIBLE BRONCHOSCOPY;  Surgeon: Vilinda Boehringer, MD;  Location: [facility] ORS;  Service: Cardiopulmonary;  Laterality: N/A;  . Ureter repair as a child      Family History  Problem Relation Age of Onset  . Brain cancer Mother   . Lung cancer Maternal Uncle   . Cancer Sister     Metastatic cancer- unknown primary    Social History:  reports that he quit smoking about 16 years ago. His smoking use included Cigarettes. He has a 13.00 pack-year smoking history. He does not have any smokeless tobacco history on file. He reports that he drinks  alcohol. He reports that he does not use drugs.  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  He will be getting a refrigerator soon.  He lives in [place].  Contact number is [phone].  The patient is accompanied by his daughter today.  Allergies:  Allergies  Allergen Reactions  . Nsaids Other (See Comments)    Pt is unable to take this class of medications.    . Sulfa Antibiotics Other (See Comments)    Reaction:  GI upset     Current Medications: Current Outpatient Prescriptions  Medication Sig Dispense Refill  . amoxicillin-clavulanate (AUGMENTIN) 500-125 MG tablet Take 1 tablet (500 mg total) by mouth 2 (two) times daily. 20 tablet 0  . atorvastatin (LIPITOR) 40 MG tablet Take 1 tablet (40 mg total) by mouth daily. 30 tablet 0  . gabapentin (NEURONTIN) 300 MG capsule Take 1 capsule (300 mg total) by mouth at bedtime. 30 capsule 2  . HYDROcodone-acetaminophen (NORCO) 7.5-325 MG tablet Take 1 tablet by mouth every 4 (four) hours as needed for moderate pain (Every 4-6 hours as needed for pain). 30 tablet 0  . levofloxacin (LEVAQUIN) 500 MG tablet Take 1 tablet (500 mg total) by mouth daily. 10 tablet 0  . ondansetron (ZOFRAN) 8 MG tablet Take 1 tablet (8 mg  total) by mouth 2 (two) times daily as needed for nausea or vomiting. 20 tablet 0  . sucralfate (CARAFATE) 1 g tablet Take 1 tablet (1 g total) by mouth 3 (three) times daily. Dissolve in 2-3 tbsp warm water, swish and swallow. 90 tablet 3   No current facility-administered medications for this visit.     Review of Systems:  GENERAL:  Feels better. No fever, chills or sweats.  Weight down 4 pounds. PERFORMANCE STATUS (ECOG):  2 HEENT: No visual changes, sore throat, mouth sores or tenderness. Lungs:  Shortness of breath on exertion (improved).  Dry cough.  No hemoptysis. Cardiac:  No chest pain, palpitations, orthopnea, or PND.  Sleeps in a recliner. GI:  Appetite good.  No  nausea, vomiting, diarrhea, constipation, melena or hematochezia. GU:  No urgency, frequency, dysuria, or hematuria. Musculoskeletal:  Pain in right knee.  No back pain.  No muscle tenderness. Extremities:  No pain or swelling. Skin:  No rashes or skin changes. Neuro:  No headache, weakness, balance or coordination issues. Endocrine:  No diabetes, thyroid issues, hot flashes or night sweats. Psych:  No mood changes, depression or anxiety. Pain:  Right knee pain.  Right sided chest discomfort.  Review of systems:  All other systems reviewed and found to be negative.  Physical Exam: Blood pressure 108/65, pulse (!) 118, temperature 99.5 F (37.5 C), temperature source Tympanic, resp. rate 18, weight 123 lb 7.3 oz (56 kg), SpO2 96 %. GENERAL:  Thin gentleman sitting comfortably in the exam room in no acute distress. MENTAL STATUS:  Alert and oriented to person, place and time. HEAD:  Wearing a  camo cap.  Long gray hair in pony tail.  Scruffy gray beard.  Temporal wasting.  Normocephalic, atraumatic, face symmetric, no Cushingoid features. EYES:  Blue eyes.  Pupils equal round and reactive to light and accomodation.  No conjunctivitis or scleral icterus. ENT:  Oropharynx clear without lesion.  Tongue normal. Mucous membranes moist.  RESPIRATORY:  Clear to auscultation without rales, wheezes or rhonchi. CARDIOVASCULAR:  Regular rate and rhythm without murmur, rub or gallop. ABDOMEN:  Soft, non-tender, with active bowel sounds, and no hepatosplenomegaly.  No masses. SKIN:  No rashes, ulcers or lesions. EXTREMITIES: No edema, no skin discoloration or tenderness.  No palpable cords. LYMPH NODES: No palpable cervical, supraclavicular, axillary or inguinal adenopathy  NEUROLOGICAL: Unremarkable. PSYCH:  Appropriate.    Appointment on 07/03/2016  Component Date Value Ref Range Status  . WBC 07/03/2016 5.8  3.8 - 10.6 K/uL Final  . RBC 07/03/2016 3.46* 4.40 - 5.90 MIL/uL Final  . Hemoglobin  07/03/2016 10.9* 13.0 - 18.0 g/dL Final  . HCT 07/03/2016 33.6* 40.0 - 52.0 % Final  . MCV 07/03/2016 97.1  80.0 - 100.0 fL Final  . MCH 07/03/2016 31.6  26.0 - 34.0 pg Final  . MCHC 07/03/2016 32.6  32.0 - 36.0 g/dL Final  . RDW 07/03/2016 20.4* 11.5 - 14.5 % Final  . Platelets 07/03/2016 318  150 - 440 K/uL Final  . Neutrophils Relative % 07/03/2016 51  % Final  . Neutro Abs 07/03/2016 3.0  1.4 - 6.5 K/uL Final  . Lymphocytes Relative 07/03/2016 16  % Final  . Lymphs Abs 07/03/2016 0.9* 1.0 - 3.6 K/uL Final  . Monocytes Relative 07/03/2016 19  % Final  . Monocytes Absolute 07/03/2016 1.1* 0.2 - 1.0 K/uL Final  . Eosinophils Relative 07/03/2016 12  % Final  . Eosinophils Absolute 07/03/2016 0.7  0 - 0.7 K/uL  Final  . Basophils Relative 07/03/2016 2  % Final  . Basophils Absolute 07/03/2016 0.1  0 - 0.1 K/uL Final  . Sodium 07/03/2016 136  135 - 145 mmol/L Final  . Potassium 07/03/2016 3.7  3.5 - 5.1 mmol/L Final  . Chloride 07/03/2016 107  101 - 111 mmol/L Final  . CO2 07/03/2016 21* 22 - 32 mmol/L Final  . Glucose, Bld 07/03/2016 98  65 - 99 mg/dL Final  . BUN 07/03/2016 13  6 - 20 mg/dL Final  . Creatinine, Ser 07/03/2016 0.66  0.61 - 1.24 mg/dL Final  . Calcium 07/03/2016 7.9* 8.9 - 10.3 mg/dL Final  . Total Protein 07/03/2016 7.5  6.5 - 8.1 g/dL Final  . Albumin 07/03/2016 2.6* 3.5 - 5.0 g/dL Final  . AST 07/03/2016 44* 15 - 41 U/L Final  . ALT 07/03/2016 24  17 - 63 U/L Final  . Alkaline Phosphatase 07/03/2016 92  38 - 126 U/L Final  . Total Bilirubin 07/03/2016 0.7  0.3 - 1.2 mg/dL Final  . GFR calc non Af Amer 07/03/2016 >60  >60 mL/min Final  . GFR calc Af Amer 07/03/2016 >60  >60 mL/min Final   Comment: (NOTE) The eGFR has been calculated using the CKD EPI equation. This calculation has not been validated in all clinical situations. eGFR's persistently <60 mL/min signify possible Chronic Kidney Disease.   . Anion gap 07/03/2016 8  5 - 15 Final  . Magnesium 07/03/2016  1.7  1.7 - 2.4 mg/dL Final    Assessment:  Gerald [name] is a 65 y.o. male with stage IV non-small cell lung cancer and associated leukocytosis with eosinophilia. He presented with a 30 pound weight loss, shortness of breath, cough, and left leg discomfort. He has a 13-15 pack year smoking history.  Chest CT angiogram on 03/27/2016 revealed an 8 cm mass in the medial right upper lobe. The mass abutted the mediastinum and possibly invaded the left atrium. There was associated widespread thoracic adenopathy. There was a 2.6 cm enhancing lesion in the medial segment of the left hepatic lobe.  Bronchoscopy on 03/30/2016 revealed an endobronchial lesion in the RUL anterior segment with 95% occlusion. There was extrinsic compression in the right middle lobe secondary to posterior mass effect. Pathology confirmed non-small cell lung cancer, favor adenocarcinoma.  PD-L1 testing revealed high expression (> 50%).  CEA was 2.3 on 03/27/2016.  PET scan on 04/05/2016 revealed intense hypermetabolism (SUV 17.95) associated with a 7.5 cm central perihilar right lung lesion. There was mediastinal (sub-carinal node SUV 8.89; 2.6 cm right paratracheal node SUV 17.49) and upper abdominal retroperitoneal hypermetabolic adenopathy (9 mm aortocaval node SUV 9.2). There were 2 liver metastasis (2.3 cm left lobe SUV 6.19; 2.2 cm segment VIII SUV 5.18). There are multifocal bone metastasis (C2 vertebral body SUV 9.0; 1.6 cm left sacral ala lesion SUV of 8.6).   Head MRI on 03/31/2016 was indeterminate for early metastatic disease to the brain. There was a small focus of gyral enhancement in the anterior right frontal lobe likely is post ischemic, while a small posterior right cerebellar enhancing lesion was more suspicious for small brain metastasis. There were multiple small primarily subacute appearing infarcts in the bilateral MCA and left PICA territories.   Head MRI on 05/12/2016 revealed expected interval  evolution of bilateral cerebral in cerebellar infarcts. There was resolution of right frontal and right cerebellar enhancement consistent with subacute infarcts. There was a new 3 mm focus of gyral enhancement in the left  occipital lobe and a possible new 4 mm enhancing lesion in the left cerebellum (? vascular enhancement versus tiny metastasis).   Bone scan on 04/07/2016 revealed a subtle distal right femur lesion which might be a small metastasis.  Bone scan is felt to be an unreliable modality for evaluation of osseous metastatic disease as the bone metastases seen on the recent PET and MRI were not evident.  He receives [place] monthly (last 06/23/2016).  He has marked leukocytosis with hypereosinophilia. Initial WBC was 72,300 with 53% eosinophils. Bone marrow on 03/29/2016 revealed marked increase in marrow eosinophils (approximate 30%). There was no diagnostic morphologic evidence of a myeloproliferative or lymphoid neoplasm. Flow cytometry revealed significant increase in eosinophils (54%). There was relative decreased myeloid cells with no significant immunophenotypic abnormalities or increase in blasts. There was no lymphoid abnormalities or evidence of clonality. FISH studies were negative for myeloproliferative neoplasms with eosinophilia (PDGFRA, PDGFRB, FGFR1). BCR-ABL was negative on 03/28/2016.  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  Left lower extremity duplex on 03/25/2016 revealed no evidence of DVT. Discomfort improved on Neurontin 300 mg a day and continues to improve with declining eosinophilia.  He received 41.4 Gy from 04/11/2016 - 05/23/2016.  He received 4 weeks of concurrent carboplatin and Taxol (04/17/2016 - 05/22/2016).  He was diagnosed with aspiration pneumonia.  Chest CT on 06/16/2016 revealed multifocal airspace opacities within the right lung which progressed since the prior CT, suspicious for  postobstructive and/or aspiration pneumonia. There was a cavitary component in the right upper lobe posteriorly.  The dominant right upper lobe mass and the hilar adenopathy had improved.  He received 10 days of Levaquin.  He completed a course of Augmentin.  Symptomatically, he feels better.  Exam is stable.  Code status is DNR/DNI.  Plan: 1.  Labs today:  CBC with diff, CMP, Mg. 2.  Review CXR today-done. 3.  Review treatment plan.  Side effects of Keytruda reviewed.  Patient consented to treatment. 4.  Rx:  Neurontin and Lortab. 5.  RTC in 3 weeks for MD assess and labs (CBC with diff, CMP, Mg, TSH) + Keytruda.   Lequita Asal, MD  07/03/2016, 3:04 PM

## 2016-07-04 ENCOUNTER — Other Ambulatory Visit: Payer: Self-pay | Admitting: *Deleted

## 2016-07-04 ENCOUNTER — Other Ambulatory Visit: Payer: Medicare Other

## 2016-07-04 DIAGNOSIS — C3411 Malignant neoplasm of upper lobe, right bronchus or lung: Secondary | ICD-10-CM

## 2016-07-04 DIAGNOSIS — C7951 Secondary malignant neoplasm of bone: Secondary | ICD-10-CM

## 2016-07-11 ENCOUNTER — Ambulatory Visit (INDEPENDENT_AMBULATORY_CARE_PROVIDER_SITE_OTHER): Payer: Medicare Other | Admitting: Internal Medicine

## 2016-07-11 ENCOUNTER — Encounter: Payer: Self-pay | Admitting: Internal Medicine

## 2016-07-11 DIAGNOSIS — I639 Cerebral infarction, unspecified: Secondary | ICD-10-CM | POA: Diagnosis not present

## 2016-07-11 DIAGNOSIS — R222 Localized swelling, mass and lump, trunk: Secondary | ICD-10-CM | POA: Diagnosis not present

## 2016-07-11 DIAGNOSIS — C3411 Malignant neoplasm of upper lobe, right bronchus or lung: Secondary | ICD-10-CM | POA: Diagnosis not present

## 2016-07-11 DIAGNOSIS — J189 Pneumonia, unspecified organism: Secondary | ICD-10-CM | POA: Diagnosis not present

## 2016-07-11 DIAGNOSIS — R0600 Dyspnea, unspecified: Secondary | ICD-10-CM | POA: Diagnosis not present

## 2016-07-11 DIAGNOSIS — C3490 Malignant neoplasm of unspecified part of unspecified bronchus or lung: Secondary | ICD-10-CM | POA: Insufficient documentation

## 2016-07-11 DIAGNOSIS — R918 Other nonspecific abnormal finding of lung field: Secondary | ICD-10-CM

## 2016-07-11 DIAGNOSIS — J181 Lobar pneumonia, unspecified organism: Secondary | ICD-10-CM

## 2016-07-11 NOTE — Assessment & Plan Note (Signed)
Multifactorial: Right hilar mass, lung cancer, COPD, recent pneumonia  Recent completed one week of Levaquin with significant improvement in breathing and cough. I suspect he will have underlying baseline dyspnea given the size of his right hilar mass, along with radiation therapy and a form of chemotherapy. We'll follow up in 3 months, we'll check spirometry at that visit.

## 2016-07-11 NOTE — Assessment & Plan Note (Signed)
Right upper lobe postobstructive pneumonia with consultation of cavitary lesion now. Overall significant clinical improvement, no further coughing or shortness of breath or fever or chills. Postobstructive pneumonia and will be a recurrence for this patient given the size of his hilar mass along with his treatment regimen for his cancer. I have educated the patient on aspiration precautions, along with noxious substance avoidance, in hopes of reducing further episodes of pneumonia.  Plan: -Post obstructive pneumonia now with improvement along with a component of aspiration pneumonia now with improvement. He has completed a course of Levaquin, no further antibiotics needed at this time -He will require a follow-up CT scan probably in about 2-3 months, but surveillance schedule pending on treatment regimen and we will leave follow-up CT scheduled to oncologists decision.

## 2016-07-11 NOTE — Assessment & Plan Note (Signed)
Right hilar mass along with right-sided cavitary lung lesion and postobstructive pneumonia. Patient with a history of metastatic non-small cell cancer diagnosed in April 2017, currently following with oncology. Currently completed a cycle of radiation therapy and now is receiving a biologic regiment called Xgeva.  Plan: - 56 with surveillance imaging along with treatment regimen as outlined by oncology and radiation oncology

## 2016-07-11 NOTE — Progress Notes (Signed)
ACUTE PULMONARY OFFICE VISIT  ACUTE PROBLEM: Suspected PNA  CHRONIC PULMONARY PROBLEMS: Metastatic non small cell lung cancer diagnosed 03/2016  SUBJ: Patient presents today for a follow-up visit of her recent URI, he has a history of metastatic non-small cell cancer. CT chest done on 7/7 showed trace right-sided pleural effusion, decrease in the right perihilar mass, progressive patchy opacities throughout the right lung, cavitary component posteriorly in the right upper lobe measuring 3.1 x 2.5 cm. Was placed on Levaquin for 1 week, and also had a recent chest x-ray repeated. Findings of his x-ray and his CAT scan suggests a possible aspiration pneumonia, patient recalls possibly choking on a piece of roast prior to his symptoms at the beginning of July.  OBJ: Vitals:   07/11/16 1111  BP: 124/66  Pulse: (!) 114  SpO2: 97%  Weight: 124 lb 3.2 oz (56.3 kg)  Height: '5\' 11"'$  (1.803 m)   NAD Cachectic, disheveled HEENT WNL No JVD, LAN No wheezes or other adventitious sounds RRR s M NABS, soft  CT Chest 06/16/16 CT CHEST WITH CONTRAST  TECHNIQUE: Multidetector CT imaging of the chest was performed during intravenous contrast administration.  CONTRAST:  7m ISOVUE-300 IOPAMIDOL (ISOVUE-300) INJECTION 61%  COMPARISON:  Chest CT 03/27/2016.  PET-CT 04/05/2016  FINDINGS: Cardiovascular: No acute vascular findings are seen. There is persistent encasement and attenuation of the right pulmonary arteries by right perihilar mass. Coronary artery calcifications are present. The heart size is normal. There is no pericardial effusion.  Mediastinum/Nodes: The dominant right hilar mass has decreased in size, measuring 6.7 x 3.1 cm on image 29 (previously 8.0 x 4.7 cm). This mass demonstrates central low density consistent with necrosis. There has been improvement in the other enlarged mediastinal lymph nodes as well. High right paratracheal node measuring 1.6 cm on image 18  previously measured approximately 1.9 cm. A low right paratracheal node measuring 1.9 cm on image 24 previously measured 2.3 cm. No progressive adenopathy seen. The thyroid gland, trachea and esophagus demonstrate no significant findings.  Lungs/Pleura: There is trace pleural fluid on the right without definite malignant features. As above, the dominant right perihilar upper lobe mass has decreased in size. There are progressive patchy airspace opacities throughout the right lung. There is a cavitary component posteriorly in the right upper lobe measuring approximately 3.1 x 2.5 cm on image 38. Depending components in the right lower lobe could be secondary to aspiration. The left lung is clear. Moderate underlying emphysematous changes are noted.  Upper abdomen: Due to technical differences, accurate comparisons between the hepatic metastases is difficult. At least 2 lesions are again noted, measuring 2.8 cm in the right lobe on image 54 and 3.1 cm in the left lobe on image 59. Sequela of probable splenic infarcts are again noted. There is scarring in the upper pole the left kidney. No adrenal mass.  Musculoskeletal/Chest wall: There is no chest wall mass or suspicious osseous finding.  IMPRESSION: 1. There are multifocal airspace opacities within the right lung which have progressed since the prior CT, suspicious for postobstructive and/or aspiration pneumonia. There is a cavitary component in the right upper lobe posteriorly. 2. The dominant right upper lobe mass and the hilar adenopathy have improved. Accurate evaluation of the hepatic metastatic disease is limited. 3. Underlying emphysema and coronary artery disease noted.  CXR 07/03/16 CHEST  2 VIEW COMPARISON:  Chest x-ray of June 15, 2016 and chest CT scan of June 16, 2016. FINDINGS: A known large right perihilar mass is  stable. There is interstitial density in the parenchyma surrounding this that is not  significantly changed. Density in the right upper lobe is less conspicuous however. There is a trace of pleural fluid on the right. The left lung is well-expanded and clear. The heart and pulmonary vascularity are normal. The trachea is midline. The bony thorax exhibits no acute abnormality. IMPRESSION: Persistent right perihilar mass. Persistent increased interstitial density surrounding the mass consistent with postobstructive atelectasis. Interval improvement in the appearance of the density in the right upper lobe.  Assessment and Plan: 65 year old male past medical history of lung cancer, non-small cell, right hilar mass, seen in consultation follow-up for postobstructive and aspiration pneumonia Pneumonia Right upper lobe postobstructive pneumonia with consultation of cavitary lesion now. Overall significant clinical improvement, no further coughing or shortness of breath or fever or chills. Postobstructive pneumonia and will be a recurrence for this patient given the size of his hilar mass along with his treatment regimen for his cancer. I have educated the patient on aspiration precautions, along with noxious substance avoidance, in hopes of reducing further episodes of pneumonia.  Plan: -Post obstructive pneumonia now with improvement along with a component of aspiration pneumonia now with improvement. He has completed a course of Levaquin, no further antibiotics needed at this time -He will require a follow-up CT scan probably in about 2-3 months, but surveillance schedule pending on treatment regimen and we will leave follow-up CT scheduled to oncologists decision.  Lung cancer ([place]) Right hilar mass along with right-sided cavitary lung lesion and postobstructive pneumonia. Patient with a history of metastatic non-small cell cancer diagnosed in April 2017, currently following with oncology. Currently completed a cycle of radiation therapy and now is receiving a biologic regiment  called Xgeva.  Plan: - 36 with surveillance imaging along with treatment regimen as outlined by oncology and radiation oncology  Dyspnea Multifactorial: Right hilar mass, lung cancer, COPD, recent pneumonia  Recent completed one week of Levaquin with significant improvement in breathing and cough. I suspect he will have underlying baseline dyspnea given the size of his right hilar mass, along with radiation therapy and a form of chemotherapy. We'll follow up in 3 months, we'll check spirometry at that visit.  Right Hilar mass Non-small cell lung cancer currently receiving chemotherapy and radiation therapy, there is reduction in the size of the mass compared to previous CAT scan in April. Surveillance CT scheduled in about 2-3 months, definitive schedule CT to be done by oncologist; as pending treatment options patient may require a pet CT     I have reviewed this patient's medical problems, current medications and therapies and prior pulmonary office notes in evaluation and formulation of the above assessment and plan   Vilinda Boehringer, MD [facility] Pager [phone] (please enter 7-digits) On Call Pager - [phone] (please enter 7-digits) Clinic - [phone]  07/11/2016

## 2016-07-11 NOTE — Assessment & Plan Note (Signed)
Non-small cell lung cancer currently receiving chemotherapy and radiation therapy, there is reduction in the size of the mass compared to previous CAT scan in April. Surveillance CT scheduled in about 2-3 months, definitive schedule CT to be done by oncologist; as pending treatment options patient may require a pet CT

## 2016-07-11 NOTE — Patient Instructions (Signed)
Follow up with Dr. Stevenson Clinch in:3 months - cont with aspiration precautions - small frequent meals - avoid all forms of tobacco - Your oncologist with determine the surveillance CT scan schedule

## 2016-07-25 ENCOUNTER — Inpatient Hospital Stay: Payer: Medicare Other

## 2016-07-25 ENCOUNTER — Encounter: Payer: Self-pay | Admitting: Hematology and Oncology

## 2016-07-25 ENCOUNTER — Other Ambulatory Visit: Payer: Self-pay | Admitting: Hematology and Oncology

## 2016-07-25 ENCOUNTER — Inpatient Hospital Stay: Payer: Medicare Other | Admitting: Hematology and Oncology

## 2016-07-25 DIAGNOSIS — C3411 Malignant neoplasm of upper lobe, right bronchus or lung: Secondary | ICD-10-CM

## 2016-08-02 ENCOUNTER — Other Ambulatory Visit: Payer: Self-pay | Admitting: Hematology and Oncology

## 2016-08-02 NOTE — Progress Notes (Signed)
[facility] day:  08/03/16  Chief Complaint: Gerald [name] is a 65 y.o. male with metastatic lung cancer and associated hypereosinophilia who is seen for assessment prior to cycle #2 Keytruda.  HPI:  The patient was last seen in the medical oncology clinic on 07/03/2016.  At that time, he he was feeling better.  He received cycle #1 Keytruda.  He tolerated his treatment well.  He missed his appointment last week as his truck broke down. Symptomatically, he denies any "real problems". His daughter notes his energy level is much better.  He is fixing his own food. He has doing more activities.  He is listening to music again.  He describes headaches in the evening after 8 PM.  He denies any visual changes, focal weakness or numbness.   Past Medical History:  Diagnosis Date  . Arthritis    Rheumatoid arthritis  . Cancer ([place])    lung ca   . Collagen vascular disease (HCC)    RA  . Mass of lung   . Pneumothorax    spontaneous  . Renal disorder    as a child    Past Surgical History:  Procedure Laterality Date  . FLEXIBLE BRONCHOSCOPY N/A 03/30/2016   Procedure: FLEXIBLE BRONCHOSCOPY;  Surgeon: Vilinda Boehringer, MD;  Location: [facility] ORS;  Service: Cardiopulmonary;  Laterality: N/A;  . Ureter repair as a child      Family History  Problem Relation Age of Onset  . Brain cancer Mother   . Lung cancer Maternal Uncle   . Cancer Sister     Metastatic cancer- unknown primary    Social History:  reports that he quit smoking about 16 years ago. His smoking use included Cigarettes. He has a 13.00 pack-year smoking history. He does not have any smokeless tobacco history on file. He reports that he drinks alcohol. He reports that he does not use drugs.  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  He will be getting a refrigerator soon.  He lives in [place].  Contact number is [phone].  The  patient is accompanied by his daughter today.  Allergies:  Allergies  Allergen Reactions  . Nsaids Other (See Comments)    Pt is unable to take this class of medications.    . Sulfa Antibiotics Other (See Comments)    Reaction:  GI upset     Current Medications: Current Outpatient Prescriptions  Medication Sig Dispense Refill  . gabapentin (NEURONTIN) 300 MG capsule Take 1 capsule (300 mg total) by mouth at bedtime. 30 capsule 2  . HYDROcodone-acetaminophen (NORCO) 7.5-325 MG tablet Take 1 tablet by mouth every 4 (four) hours as needed for moderate pain (Every 4-6 hours as needed for pain). 30 tablet 0  . ondansetron (ZOFRAN) 8 MG tablet Take 1 tablet (8 mg total) by mouth 2 (two) times daily as needed for nausea or vomiting. 20 tablet 0  . sucralfate (CARAFATE) 1 g tablet Take 1 tablet (1 g total) by mouth 3 (three) times daily. Dissolve in 2-3 tbsp warm water, swish and swallow. 90 tablet 3   No current facility-administered medications for this visit.     Review of Systems:  GENERAL:  Feels good. No fever, chills or sweats.  Weight up 8 pounds. PERFORMANCE STATUS (ECOG):  2 HEENT: No visual changes, sore throat, mouth sores or tenderness. Lungs:  Shortness of breath on exertion (improved).  Dry cough.  No hemoptysis. Cardiac:  No chest pain, palpitations, orthopnea, or PND.  Sleeps in a recliner. GI:  Appetite good.  Eating well.  No nausea, vomiting, diarrhea, constipation, melena or hematochezia. GU:  No urgency, frequency, dysuria, or hematuria. Musculoskeletal:  Pain in right knee.  No back pain.  No muscle tenderness. Extremities:  No pain or swelling. Skin:  No rashes or skin changes. Neuro:  Headache at night.  No focal weakness, balance or coordination issues. Endocrine:  No diabetes, thyroid issues, hot flashes or night sweats. Psych:  No mood changes, depression or anxiety. Pain:  Right knee pain.  Review of systems:  All other systems reviewed and found to be  negative.  Physical Exam: Blood pressure 102/60, pulse 87, temperature (!) 96.9 F (36.1 C), temperature source Tympanic, resp. rate 18, weight 131 lb 13.4 oz (59.8 kg). GENERAL:  Thin gentleman sitting comfortably in the exam room in no acute distress. MENTAL STATUS:  Alert and oriented to person, place and time. HEAD:  Wearing a  camo cap.  Long gray hair in pony tail.  Goatee.  Temporal wasting.  Normocephalic, atraumatic, face symmetric, no Cushingoid features. EYES:  Blue eyes.  Pupils equal round and reactive to light and accomodation.  No conjunctivitis or scleral icterus. ENT:  Oropharynx clear without lesion.  Tongue normal. Mucous membranes moist.  RESPIRATORY:  Clear to auscultation without rales, wheezes or rhonchi. CARDIOVASCULAR:  Regular rate and rhythm without murmur, rub or gallop. ABDOMEN:  Soft, non-tender, with active bowel sounds, and no hepatosplenomegaly.  No masses. SKIN:  Mild facial erythema.  No rashes, ulcers or lesions. EXTREMITIES: No edema, no skin discoloration or tenderness.  No palpable cords. LYMPH NODES: No palpable cervical, supraclavicular, axillary or inguinal adenopathy  NEUROLOGICAL: Unremarkable. PSYCH:  Appropriate.    Appointment on 08/03/2016  Component Date Value Ref Range Status  . WBC 08/03/2016 6.4  3.8 - 10.6 K/uL Final  . RBC 08/03/2016 3.44* 4.40 - 5.90 MIL/uL Final  . Hemoglobin 08/03/2016 11.4* 13.0 - 18.0 g/dL Final  . HCT 08/03/2016 33.5* 40.0 - 52.0 % Final  . MCV 08/03/2016 97.3  80.0 - 100.0 fL Final  . MCH 08/03/2016 33.1  26.0 - 34.0 pg Final  . MCHC 08/03/2016 34.1  32.0 - 36.0 g/dL Final  . RDW 08/03/2016 15.8* 11.5 - 14.5 % Final  . Platelets 08/03/2016 219  150 - 440 K/uL Final  . Neutrophils Relative % 08/03/2016 45  % Final  . Neutro Abs 08/03/2016 2.9  1.4 - 6.5 K/uL Final  . Lymphocytes Relative 08/03/2016 20  % Final  . Lymphs Abs 08/03/2016 1.3  1.0 - 3.6 K/uL Final  . Monocytes Relative 08/03/2016 20  % Final   . Monocytes Absolute 08/03/2016 1.3* 0.2 - 1.0 K/uL Final  . Eosinophils Relative 08/03/2016 13  % Final  . Eosinophils Absolute 08/03/2016 0.8* 0 - 0.7 K/uL Final  . Basophils Relative 08/03/2016 2  % Final  . Basophils Absolute 08/03/2016 0.1  0 - 0.1 K/uL Final  . Sodium 08/03/2016 136  135 - 145 mmol/L Final  . Potassium 08/03/2016 3.9  3.5 - 5.1 mmol/L Final  . Chloride 08/03/2016 104  101 - 111 mmol/L Final  . CO2 08/03/2016 28  22 - 32 mmol/L Final  . Glucose, Bld 08/03/2016 96  65 - 99 mg/dL Final  . BUN 08/03/2016 13  6 - 20 mg/dL Final  . Creatinine, Ser 08/03/2016 0.65  0.61 - 1.24 mg/dL Final  .  Calcium 08/03/2016 9.1  8.9 - 10.3 mg/dL Final  . Total Protein 08/03/2016 7.6  6.5 - 8.1 g/dL Final  . Albumin 08/03/2016 3.2* 3.5 - 5.0 g/dL Final  . AST 08/03/2016 40  15 - 41 U/L Final  . ALT 08/03/2016 28  17 - 63 U/L Final  . Alkaline Phosphatase 08/03/2016 77  38 - 126 U/L Final  . Total Bilirubin 08/03/2016 0.4  0.3 - 1.2 mg/dL Final  . GFR calc non Af Amer 08/03/2016 >60  >60 mL/min Final  . GFR calc Af Amer 08/03/2016 >60  >60 mL/min Final   Comment: (NOTE) The eGFR has been calculated using the CKD EPI equation. This calculation has not been validated in all clinical situations. eGFR's persistently <60 mL/min signify possible Chronic Kidney Disease.   . Anion gap 08/03/2016 4* 5 - 15 Final  . Magnesium 08/03/2016 1.8  1.7 - 2.4 mg/dL Final    Assessment:  Mayur Duman is a 65 y.o. male with stage IV non-small cell lung cancer and associated leukocytosis with eosinophilia. He presented with a 30 pound weight loss, shortness of breath, cough, and left leg discomfort. He has a 13-15 pack year smoking history.  Chest CT angiogram on 03/27/2016 revealed an 8 cm mass in the medial right upper lobe. The mass abutted the mediastinum and possibly invaded the left atrium. There was associated widespread thoracic adenopathy. There was a 2.6 cm enhancing lesion in the  medial segment of the left hepatic lobe.  Bronchoscopy on 03/30/2016 revealed an endobronchial lesion in the RUL anterior segment with 95% occlusion. There was extrinsic compression in the right middle lobe secondary to posterior mass effect. Pathology confirmed non-small cell lung cancer, favor adenocarcinoma.  PD-L1 testing revealed high expression (> 50%).  CEA was 2.3 on 03/27/2016.  PET scan on 04/05/2016 revealed intense hypermetabolism (SUV 17.95) associated with a 7.5 cm central perihilar right lung lesion. There was mediastinal (sub-carinal node SUV 8.89; 2.6 cm right paratracheal node SUV 17.49) and upper abdominal retroperitoneal hypermetabolic adenopathy (9 mm aortocaval node SUV 9.2). There were 2 liver metastasis (2.3 cm left lobe SUV 6.19; 2.2 cm segment VIII SUV 5.18). There are multifocal bone metastasis (C2 vertebral body SUV 9.0; 1.6 cm left sacral ala lesion SUV of 8.6).   Head MRI on 03/31/2016 was indeterminate for early metastatic disease to the brain. There was a small focus of gyral enhancement in the anterior right frontal lobe likely is post ischemic, while a small posterior right cerebellar enhancing lesion was more suspicious for small brain metastasis. There were multiple small primarily subacute appearing infarcts in the bilateral MCA and left PICA territories.   Head MRI on 05/12/2016 revealed expected interval evolution of bilateral cerebral in cerebellar infarcts. There was resolution of right frontal and right cerebellar enhancement consistent with subacute infarcts. There was a new 3 mm focus of gyral enhancement in the left occipital lobe and a possible new 4 mm enhancing lesion in the left cerebellum (? vascular enhancement versus tiny metastasis).   Bone scan on 04/07/2016 revealed a subtle distal right femur lesion which might be a small metastasis.  Bone scan is felt to be an unreliable modality for evaluation of osseous metastatic disease as the bone  metastases seen on the recent PET and MRI were not evident.  He receives [place] monthly (last 06/23/2016).  He has marked leukocytosis with hypereosinophilia. Initial WBC was 72,300 with 53% eosinophils. Bone marrow on 03/29/2016 revealed marked increase in marrow eosinophils (approximate  30%). There was no diagnostic morphologic evidence of a myeloproliferative or lymphoid neoplasm. Flow cytometry revealed significant increase in eosinophils (54%). There was relative decreased myeloid cells with no significant immunophenotypic abnormalities or increase in blasts. There was no lymphoid abnormalities or evidence of clonality. FISH studies were negative for myeloproliferative neoplasms with eosinophilia (PDGFRA, PDGFRB, FGFR1). BCR-ABL was negative on 03/28/2016.  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  Left lower extremity duplex on 03/25/2016 revealed no evidence of DVT. Discomfort improved on Neurontin 300 mg a day and continues to improve with declining eosinophilia.  He received 41.4 Gy from 04/11/2016 - 05/23/2016.  He received 4 weeks of concurrent carboplatin and Taxol (04/17/2016 - 05/22/2016).  He began [place] on 07/03/2016  He was diagnosed with aspiration pneumonia.  Chest CT on 06/16/2016 revealed multifocal airspace opacities within the right lung which progressed since the prior CT, suspicious for postobstructive and/or aspiration pneumonia. There was a cavitary component in the right upper lobe posteriorly.  The dominant right upper lobe mass and the hilar adenopathy had improved.  He received 10 days of Levaquin.  He completed a course of Augmentin.  Symptomatically, he feels better.  He is gaining weight.  He has an evening headache.  Exam reveals mild facial erythema.  Code status is DNR/DNI.  Plan: 1.  Labs today:  CBC with diff, CMP, Mg, TSH. 2.  Discuss headache.  Discuss plan for follow-up head MRI. 3.  Discuss  facial erythema.  Distribution similar to razor irritation, but may be due to [facility].  Patient to call if worsening rash for initiation of topical steroids.  4.  Cycle #2 Keytruda. 5.  Xgeva today. 6.  Head MRI: headache; f/u lesions. 7.  RTC in 3 weeks for MD assess, labs (CBC with diff, CMP, Mg), and Keytruda.   Lequita Asal, MD  08/03/2016, 9:45 AM

## 2016-08-03 ENCOUNTER — Other Ambulatory Visit: Payer: Self-pay | Admitting: *Deleted

## 2016-08-03 ENCOUNTER — Inpatient Hospital Stay: Payer: Medicare Other | Attending: Hematology and Oncology

## 2016-08-03 ENCOUNTER — Inpatient Hospital Stay: Payer: Medicare Other

## 2016-08-03 ENCOUNTER — Encounter (INDEPENDENT_AMBULATORY_CARE_PROVIDER_SITE_OTHER): Payer: Self-pay

## 2016-08-03 ENCOUNTER — Inpatient Hospital Stay (HOSPITAL_BASED_OUTPATIENT_CLINIC_OR_DEPARTMENT_OTHER): Payer: Medicare Other | Admitting: Hematology and Oncology

## 2016-08-03 ENCOUNTER — Encounter: Payer: Self-pay | Admitting: Hematology and Oncology

## 2016-08-03 VITALS — BP 102/60 | HR 87 | Temp 96.9°F | Resp 18 | Wt 131.8 lb

## 2016-08-03 DIAGNOSIS — D721 Eosinophilia: Secondary | ICD-10-CM | POA: Diagnosis not present

## 2016-08-03 DIAGNOSIS — Z66 Do not resuscitate: Secondary | ICD-10-CM | POA: Diagnosis not present

## 2016-08-03 DIAGNOSIS — C7951 Secondary malignant neoplasm of bone: Secondary | ICD-10-CM | POA: Diagnosis not present

## 2016-08-03 DIAGNOSIS — Z8701 Personal history of pneumonia (recurrent): Secondary | ICD-10-CM

## 2016-08-03 DIAGNOSIS — Z809 Family history of malignant neoplasm, unspecified: Secondary | ICD-10-CM

## 2016-08-03 DIAGNOSIS — C3411 Malignant neoplasm of upper lobe, right bronchus or lung: Secondary | ICD-10-CM

## 2016-08-03 DIAGNOSIS — R51 Headache: Secondary | ICD-10-CM | POA: Insufficient documentation

## 2016-08-03 DIAGNOSIS — L539 Erythematous condition, unspecified: Secondary | ICD-10-CM | POA: Diagnosis not present

## 2016-08-03 DIAGNOSIS — Z77090 Contact with and (suspected) exposure to asbestos: Secondary | ICD-10-CM | POA: Diagnosis not present

## 2016-08-03 DIAGNOSIS — Z79899 Other long term (current) drug therapy: Secondary | ICD-10-CM

## 2016-08-03 DIAGNOSIS — D72829 Elevated white blood cell count, unspecified: Secondary | ICD-10-CM | POA: Diagnosis not present

## 2016-08-03 DIAGNOSIS — C787 Secondary malignant neoplasm of liver and intrahepatic bile duct: Secondary | ICD-10-CM

## 2016-08-03 DIAGNOSIS — Z801 Family history of malignant neoplasm of trachea, bronchus and lung: Secondary | ICD-10-CM | POA: Insufficient documentation

## 2016-08-03 DIAGNOSIS — I998 Other disorder of circulatory system: Secondary | ICD-10-CM | POA: Diagnosis not present

## 2016-08-03 DIAGNOSIS — M069 Rheumatoid arthritis, unspecified: Secondary | ICD-10-CM | POA: Diagnosis not present

## 2016-08-03 DIAGNOSIS — R590 Localized enlarged lymph nodes: Secondary | ICD-10-CM

## 2016-08-03 DIAGNOSIS — I639 Cerebral infarction, unspecified: Secondary | ICD-10-CM

## 2016-08-03 DIAGNOSIS — Z87891 Personal history of nicotine dependence: Secondary | ICD-10-CM | POA: Diagnosis not present

## 2016-08-03 DIAGNOSIS — Z5112 Encounter for antineoplastic immunotherapy: Secondary | ICD-10-CM | POA: Diagnosis not present

## 2016-08-03 DIAGNOSIS — G939 Disorder of brain, unspecified: Secondary | ICD-10-CM

## 2016-08-03 LAB — COMPREHENSIVE METABOLIC PANEL
ALT: 28 U/L (ref 17–63)
AST: 40 U/L (ref 15–41)
Albumin: 3.2 g/dL — ABNORMAL LOW (ref 3.5–5.0)
Alkaline Phosphatase: 77 U/L (ref 38–126)
Anion gap: 4 — ABNORMAL LOW (ref 5–15)
BUN: 13 mg/dL (ref 6–20)
CO2: 28 mmol/L (ref 22–32)
Calcium: 9.1 mg/dL (ref 8.9–10.3)
Chloride: 104 mmol/L (ref 101–111)
Creatinine, Ser: 0.65 mg/dL (ref 0.61–1.24)
GFR calc Af Amer: >60 mL/min (ref >60)
GFR calc non Af Amer: >60 mL/min (ref >60)
Glucose, Bld: 96 mg/dL (ref 65–99)
Potassium: 3.9 mmol/L (ref 3.5–5.1)
Sodium: 136 mmol/L (ref 135–145)
Total Bilirubin: 0.4 mg/dL (ref 0.3–1.2)
Total Protein: 7.6 g/dL (ref 6.5–8.1)

## 2016-08-03 LAB — CBC WITH DIFFERENTIAL/PLATELET
Basophils Absolute: 0.1 10*3/uL (ref 0–0.1)
Basophils Relative: 2 %
Eosinophils Absolute: 0.8 10*3/uL — ABNORMAL HIGH (ref 0–0.7)
Eosinophils Relative: 13 %
HCT: 33.5 % — ABNORMAL LOW (ref 40.0–52.0)
Hemoglobin: 11.4 g/dL — ABNORMAL LOW (ref 13.0–18.0)
Lymphocytes Relative: 20 %
Lymphs Abs: 1.3 10*3/uL (ref 1.0–3.6)
MCH: 33.1 pg (ref 26.0–34.0)
MCHC: 34.1 g/dL (ref 32.0–36.0)
MCV: 97.3 fL (ref 80.0–100.0)
Monocytes Absolute: 1.3 10*3/uL — ABNORMAL HIGH (ref 0.2–1.0)
Monocytes Relative: 20 %
Neutro Abs: 2.9 10*3/uL (ref 1.4–6.5)
Neutrophils Relative %: 45 %
Platelets: 219 10*3/uL (ref 150–440)
RBC: 3.44 MIL/uL — ABNORMAL LOW (ref 4.40–5.90)
RDW: 15.8 % — ABNORMAL HIGH (ref 11.5–14.5)
WBC: 6.4 10*3/uL (ref 3.8–10.6)

## 2016-08-03 LAB — MAGNESIUM: Magnesium: 1.8 mg/dL (ref 1.7–2.4)

## 2016-08-03 LAB — TSH: TSH: 2.821 u[IU]/mL (ref 0.350–4.500)

## 2016-08-03 MED ORDER — SODIUM CHLORIDE 0.9 % IV SOLN
200.0000 mg | Freq: Once | INTRAVENOUS | Status: AC
  Administered 2016-08-03: 200 mg via INTRAVENOUS
  Filled 2016-08-03: qty 8

## 2016-08-03 MED ORDER — SODIUM CHLORIDE 0.9 % IV SOLN
Freq: Once | INTRAVENOUS | Status: AC
  Administered 2016-08-03: 11:00:00 via INTRAVENOUS
  Filled 2016-08-03: qty 1000

## 2016-08-03 MED ORDER — DENOSUMAB 120 MG/1.7ML ~~LOC~~ SOLN
120.0000 mg | Freq: Once | SUBCUTANEOUS | Status: AC
  Administered 2016-08-03: 120 mg via SUBCUTANEOUS
  Filled 2016-08-03: qty 1.7

## 2016-08-03 NOTE — Progress Notes (Signed)
Patient is here today for follow up, he mentions he has frequent headaches they can be dull and achy but sometimes they are sharp and stabbing.

## 2016-08-05 ENCOUNTER — Encounter: Payer: Self-pay | Admitting: Hematology and Oncology

## 2016-08-10 ENCOUNTER — Ambulatory Visit: Payer: Medicare Other

## 2016-08-16 ENCOUNTER — Ambulatory Visit
Admission: RE | Admit: 2016-08-16 | Discharge: 2016-08-16 | Disposition: A | Discharge status: Home / Self Care | Payer: Medicare Other | Source: Ambulatory Visit | Attending: Hematology and Oncology | Admitting: Hematology and Oncology

## 2016-08-16 DIAGNOSIS — G939 Disorder of brain, unspecified: Secondary | ICD-10-CM | POA: Diagnosis not present

## 2016-08-16 DIAGNOSIS — C3490 Malignant neoplasm of unspecified part of unspecified bronchus or lung: Secondary | ICD-10-CM | POA: Diagnosis not present

## 2016-08-16 DIAGNOSIS — C3411 Malignant neoplasm of upper lobe, right bronchus or lung: Secondary | ICD-10-CM | POA: Diagnosis not present

## 2016-08-16 DIAGNOSIS — Z8673 Personal history of transient ischemic attack (TIA), and cerebral infarction without residual deficits: Secondary | ICD-10-CM | POA: Diagnosis not present

## 2016-08-16 DIAGNOSIS — R937 Abnormal findings on diagnostic imaging of other parts of musculoskeletal system: Secondary | ICD-10-CM | POA: Diagnosis not present

## 2016-08-16 DIAGNOSIS — I639 Cerebral infarction, unspecified: Secondary | ICD-10-CM | POA: Diagnosis present

## 2016-08-16 MED ORDER — GADOBENATE DIMEGLUMINE 529 MG/ML IV SOLN
15.0000 mL | Freq: Once | INTRAVENOUS | Status: AC | PRN
  Administered 2016-08-16: 12 mL via INTRAVENOUS

## 2016-08-24 ENCOUNTER — Inpatient Hospital Stay: Payer: Medicare Other

## 2016-08-24 ENCOUNTER — Other Ambulatory Visit: Payer: Self-pay | Admitting: Hematology and Oncology

## 2016-08-24 ENCOUNTER — Inpatient Hospital Stay: Payer: Medicare Other | Admitting: Hematology and Oncology

## 2016-08-24 NOTE — Progress Notes (Unsigned)
[facility] day:  08/24/16  Chief Complaint: Gerald [name] is a 65 y.o. male with metastatic lung cancer and associated hypereosinophilia who is seen for assessment prior to cycle #3 Keytruda.  HPI:  The patient was last seen in the medical oncology clinic on 08/03/2016.  At that time, he felt better.  He was gaining weight.  He described evening headaches.  Exam revealed mild facial erythema.  He received Xgeva and cycle #2 Keytruda.  Head MRI on 08/16/2016 revealed multiple areas of improving enhancement and restricted diffusion compatible with resolving subacute infarcts.  There was no evidence of metastatic disease or acute infarction  There was heterogeneous bone marrow signal at C2 and C3 showing interval improvement likely due to treated metastatic disease.    Past Medical History:  Diagnosis Date  . Arthritis    Rheumatoid arthritis  . Cancer ([place])    lung ca   . Collagen vascular disease ([facility])    RA  . Mass of lung   . Pneumothorax    spontaneous  . Renal disorder    as a child    Past Surgical History:  Procedure Laterality Date  . FLEXIBLE BRONCHOSCOPY N/A 03/30/2016   Procedure: FLEXIBLE BRONCHOSCOPY;  Surgeon: Vilinda Boehringer, MD;  Location: [facility] ORS;  Service: Cardiopulmonary;  Laterality: N/A;  . Ureter repair as a child      Family History  Problem Relation Age of Onset  . Brain cancer Mother   . Lung cancer Maternal Uncle   . Cancer Sister     Metastatic cancer- unknown primary    Social History:  reports that he quit smoking about 16 years ago. His smoking use included Cigarettes. He has a 13.00 pack-year smoking history. He does not have any smokeless tobacco history on file. He reports that he drinks alcohol. He reports that he does not use drugs.  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  He will be getting a refrigerator soon.  He lives in [place].  Contact  number is [phone].  The patient is accompanied by his daughter today.  Allergies:  Allergies  Allergen Reactions  . Nsaids Other (See Comments)    Pt is unable to take this class of medications.    . Sulfa Antibiotics Other (See Comments)    Reaction:  GI upset     Current Medications: Current Outpatient Prescriptions  Medication Sig Dispense Refill  . gabapentin (NEURONTIN) 300 MG capsule Take 1 capsule (300 mg total) by mouth at bedtime. 30 capsule 2  . HYDROcodone-acetaminophen (NORCO) 7.5-325 MG tablet Take 1 tablet by mouth every 4 (four) hours as needed for moderate pain (Every 4-6 hours as needed for pain). 30 tablet 0  . ondansetron (ZOFRAN) 8 MG tablet Take 1 tablet (8 mg total) by mouth 2 (two) times daily as needed for nausea or vomiting. 20 tablet 0  . sucralfate (CARAFATE) 1 g tablet Take 1 tablet (1 g total) by mouth 3 (three) times daily. Dissolve in 2-3 tbsp warm water, swish and swallow. 90 tablet 3   No current facility-administered medications for this visit.     Review of Systems:  GENERAL:  Feels good. No fever, chills or sweats.  Weight up 8 pounds. PERFORMANCE STATUS (ECOG):  2 HEENT: No visual changes, sore throat, mouth sores or tenderness. Lungs:  Shortness of breath on exertion (improved).  Dry cough.  No  hemoptysis. Cardiac:  No chest pain, palpitations, orthopnea, or PND.  Sleeps in a recliner. GI:  Appetite good.  Eating well.  No nausea, vomiting, diarrhea, constipation, melena or hematochezia. GU:  No urgency, frequency, dysuria, or hematuria. Musculoskeletal:  Pain in right knee.  No back pain.  No muscle tenderness. Extremities:  No pain or swelling. Skin:  No rashes or skin changes. Neuro:  Headache at night.  No focal weakness, balance or coordination issues. Endocrine:  No diabetes, thyroid issues, hot flashes or night sweats. Psych:  No mood changes, depression or anxiety. Pain:  Right knee pain.  Review of systems:  All other systems  reviewed and found to be negative.  Physical Exam: There were no vitals taken for this visit. GENERAL:  Thin gentleman sitting comfortably in the exam room in no acute distress. MENTAL STATUS:  Alert and oriented to person, place and time. HEAD:  Wearing a  camo cap.  Long gray hair in pony tail.  Goatee.  Temporal wasting.  Normocephalic, atraumatic, face symmetric, no Cushingoid features. EYES:  Blue eyes.  Pupils equal round and reactive to light and accomodation.  No conjunctivitis or scleral icterus. ENT:  Oropharynx clear without lesion.  Tongue normal. Mucous membranes moist.  RESPIRATORY:  Clear to auscultation without rales, wheezes or rhonchi. CARDIOVASCULAR:  Regular rate and rhythm without murmur, rub or gallop. ABDOMEN:  Soft, non-tender, with active bowel sounds, and no hepatosplenomegaly.  No masses. SKIN:  Mild facial erythema.  No rashes, ulcers or lesions. EXTREMITIES: No edema, no skin discoloration or tenderness.  No palpable cords. LYMPH NODES: No palpable cervical, supraclavicular, axillary or inguinal adenopathy  NEUROLOGICAL: Unremarkable. PSYCH:  Appropriate.    No visits with results within 3 Day(s) from this visit.  Latest known visit with results is:  Appointment on 08/03/2016  Component Date Value Ref Range Status  . WBC 08/03/2016 6.4  3.8 - 10.6 K/uL Final  . RBC 08/03/2016 3.44* 4.40 - 5.90 MIL/uL Final  . Hemoglobin 08/03/2016 11.4* 13.0 - 18.0 g/dL Final  . HCT 08/03/2016 33.5* 40.0 - 52.0 % Final  . MCV 08/03/2016 97.3  80.0 - 100.0 fL Final  . MCH 08/03/2016 33.1  26.0 - 34.0 pg Final  . MCHC 08/03/2016 34.1  32.0 - 36.0 g/dL Final  . RDW 08/03/2016 15.8* 11.5 - 14.5 % Final  . Platelets 08/03/2016 219  150 - 440 K/uL Final  . Neutrophils Relative % 08/03/2016 45  % Final  . Neutro Abs 08/03/2016 2.9  1.4 - 6.5 K/uL Final  . Lymphocytes Relative 08/03/2016 20  % Final  . Lymphs Abs 08/03/2016 1.3  1.0 - 3.6 K/uL Final  . Monocytes Relative  08/03/2016 20  % Final  . Monocytes Absolute 08/03/2016 1.3* 0.2 - 1.0 K/uL Final  . Eosinophils Relative 08/03/2016 13  % Final  . Eosinophils Absolute 08/03/2016 0.8* 0 - 0.7 K/uL Final  . Basophils Relative 08/03/2016 2  % Final  . Basophils Absolute 08/03/2016 0.1  0 - 0.1 K/uL Final  . Sodium 08/03/2016 136  135 - 145 mmol/L Final  . Potassium 08/03/2016 3.9  3.5 - 5.1 mmol/L Final  . Chloride 08/03/2016 104  101 - 111 mmol/L Final  . CO2 08/03/2016 28  22 - 32 mmol/L Final  . Glucose, Bld 08/03/2016 96  65 - 99 mg/dL Final  . BUN 08/03/2016 13  6 - 20 mg/dL Final  . Creatinine, Ser 08/03/2016 0.65  0.61 - 1.24 mg/dL Final  .  Calcium 08/03/2016 9.1  8.9 - 10.3 mg/dL Final  . Total Protein 08/03/2016 7.6  6.5 - 8.1 g/dL Final  . Albumin 08/03/2016 3.2* 3.5 - 5.0 g/dL Final  . AST 08/03/2016 40  15 - 41 U/L Final  . ALT 08/03/2016 28  17 - 63 U/L Final  . Alkaline Phosphatase 08/03/2016 77  38 - 126 U/L Final  . Total Bilirubin 08/03/2016 0.4  0.3 - 1.2 mg/dL Final  . GFR calc non Af Amer 08/03/2016 >60  >60 mL/min Final  . GFR calc Af Amer 08/03/2016 >60  >60 mL/min Final   Comment: (NOTE) The eGFR has been calculated using the CKD EPI equation. This calculation has not been validated in all clinical situations. eGFR's persistently <60 mL/min signify possible Chronic Kidney Disease.   . Anion gap 08/03/2016 4* 5 - 15 Final  . TSH 08/03/2016 2.821  0.350 - 4.500 uIU/mL Final  . Magnesium 08/03/2016 1.8  1.7 - 2.4 mg/dL Final    Assessment:  Deshone Lyssy is a 65 y.o. male with stage IV non-small cell lung cancer and associated leukocytosis with eosinophilia. He presented with a 30 pound weight loss, shortness of breath, cough, and left leg discomfort. He has a 13-15 pack year smoking history.  Chest CT angiogram on 03/27/2016 revealed an 8 cm mass in the medial right upper lobe. The mass abutted the mediastinum and possibly invaded the left atrium. There was associated  widespread thoracic adenopathy. There was a 2.6 cm enhancing lesion in the medial segment of the left hepatic lobe.  Bronchoscopy on 03/30/2016 revealed an endobronchial lesion in the RUL anterior segment with 95% occlusion. There was extrinsic compression in the right middle lobe secondary to posterior mass effect. Pathology confirmed non-small cell lung cancer, favor adenocarcinoma.  PD-L1 testing revealed high expression (> 50%).  CEA was 2.3 on 03/27/2016.  PET scan on 04/05/2016 revealed intense hypermetabolism (SUV 17.95) associated with a 7.5 cm central perihilar right lung lesion. There was mediastinal (sub-carinal node SUV 8.89; 2.6 cm right paratracheal node SUV 17.49) and upper abdominal retroperitoneal hypermetabolic adenopathy (9 mm aortocaval node SUV 9.2). There were 2 liver metastasis (2.3 cm left lobe SUV 6.19; 2.2 cm segment VIII SUV 5.18). There are multifocal bone metastasis (C2 vertebral body SUV 9.0; 1.6 cm left sacral ala lesion SUV of 8.6).   Head MRI on 03/31/2016 was indeterminate for early metastatic disease to the brain. There was a small focus of gyral enhancement in the anterior right frontal lobe likely is post ischemic, while a small posterior right cerebellar enhancing lesion was more suspicious for small brain metastasis. There were multiple small primarily subacute appearing infarcts in the bilateral MCA and left PICA territories.   Head MRI on 05/12/2016 revealed expected interval evolution of bilateral cerebral in cerebellar infarcts. There was resolution of right frontal and right cerebellar enhancement consistent with subacute infarcts. There was a new 3 mm focus of gyral enhancement in the left occipital lobe and a possible new 4 mm enhancing lesion in the left cerebellum (? vascular enhancement versus tiny metastasis).   Head MRI on 08/16/2016 revealed multiple areas of improving enhancement and restricted diffusion compatible with resolving subacute  infarcts.  There was no evidence of metastatic disease or acute infarction   Bone scan on 04/07/2016 revealed a subtle distal right femur lesion which might be a small metastasis.  Bone scan is felt to be an unreliable modality for evaluation of osseous metastatic disease as the bone metastases  seen on the recent PET and MRI were not evident.  He receives [place] monthly (last 08/03/2016).  He has marked leukocytosis with hypereosinophilia. Initial WBC was 72,300 with 53% eosinophils. Bone marrow on 03/29/2016 revealed marked increase in marrow eosinophils (approximate 30%). There was no diagnostic morphologic evidence of a myeloproliferative or lymphoid neoplasm. Flow cytometry revealed significant increase in eosinophils (54%). There was relative decreased myeloid cells with no significant immunophenotypic abnormalities or increase in blasts. There was no lymphoid abnormalities or evidence of clonality. FISH studies were negative for myeloproliferative neoplasms with eosinophilia (PDGFRA, PDGFRB, FGFR1). BCR-ABL was negative on 03/28/2016.  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  Left lower extremity duplex on 03/25/2016 revealed no evidence of DVT. Discomfort improved on Neurontin 300 mg a day and continues to improve with declining eosinophilia.  He received 41.4 Gy from 04/11/2016 - 05/23/2016.  He received 4 weeks of concurrent carboplatin and Taxol (04/17/2016 - 05/22/2016).  He has received 2 cycles of Keytruda (07/03/2016 - 08/03/2016).  He was diagnosed with aspiration pneumonia.  Chest CT on 06/16/2016 revealed multifocal airspace opacities within the right lung which had progressed since the prior CT, suspicious for postobstructive and/or aspiration pneumonia. There was a cavitary component in the right upper lobe posteriorly.  The dominant right upper lobe mass and the hilar adenopathy had improved.  He received 10 days of  Levaquin.  He completed a course of Augmentin.  Symptomatically, he feels better.  He is gaining weight.  He has an evening headache.  Exam reveals mild facial erythema.  Code status is DNR/DNI.  Plan: 1.  Labs today:  CBC with diff, CMP, Mg,. 2.  Review head MRI. 3.  Cycle #3 Keytruda. 4.  RTC in 3 weeks for MD assess, labs (CBC with diff, CMP, Mg), Xgeva, and cycle #4 Keytruda.   Lequita Asal, MD  08/24/2016, 3:39 AM

## 2016-08-31 ENCOUNTER — Inpatient Hospital Stay: Payer: Medicare Other | Attending: Hematology and Oncology

## 2016-08-31 ENCOUNTER — Other Ambulatory Visit: Payer: Self-pay | Admitting: Hematology and Oncology

## 2016-08-31 ENCOUNTER — Other Ambulatory Visit: Payer: Self-pay | Admitting: *Deleted

## 2016-08-31 ENCOUNTER — Inpatient Hospital Stay: Payer: Medicare Other

## 2016-08-31 ENCOUNTER — Inpatient Hospital Stay (HOSPITAL_BASED_OUTPATIENT_CLINIC_OR_DEPARTMENT_OTHER): Payer: Medicare Other | Admitting: Hematology and Oncology

## 2016-08-31 ENCOUNTER — Encounter: Payer: Self-pay | Admitting: Hematology and Oncology

## 2016-08-31 VITALS — Temp 96.5°F | Resp 20

## 2016-08-31 VITALS — BP 90/52 | HR 99 | Temp 96.0°F | Resp 18 | Wt 131.8 lb

## 2016-08-31 DIAGNOSIS — G629 Polyneuropathy, unspecified: Secondary | ICD-10-CM | POA: Diagnosis not present

## 2016-08-31 DIAGNOSIS — R05 Cough: Secondary | ICD-10-CM | POA: Diagnosis not present

## 2016-08-31 DIAGNOSIS — M069 Rheumatoid arthritis, unspecified: Secondary | ICD-10-CM

## 2016-08-31 DIAGNOSIS — Z5112 Encounter for antineoplastic immunotherapy: Secondary | ICD-10-CM | POA: Insufficient documentation

## 2016-08-31 DIAGNOSIS — Z809 Family history of malignant neoplasm, unspecified: Secondary | ICD-10-CM | POA: Diagnosis not present

## 2016-08-31 DIAGNOSIS — K769 Liver disease, unspecified: Secondary | ICD-10-CM

## 2016-08-31 DIAGNOSIS — I739 Peripheral vascular disease, unspecified: Secondary | ICD-10-CM | POA: Insufficient documentation

## 2016-08-31 DIAGNOSIS — D72829 Elevated white blood cell count, unspecified: Secondary | ICD-10-CM | POA: Insufficient documentation

## 2016-08-31 DIAGNOSIS — Z801 Family history of malignant neoplasm of trachea, bronchus and lung: Secondary | ICD-10-CM | POA: Insufficient documentation

## 2016-08-31 DIAGNOSIS — J690 Pneumonitis due to inhalation of food and vomit: Secondary | ICD-10-CM | POA: Diagnosis not present

## 2016-08-31 DIAGNOSIS — C7951 Secondary malignant neoplasm of bone: Secondary | ICD-10-CM

## 2016-08-31 DIAGNOSIS — C3411 Malignant neoplasm of upper lobe, right bronchus or lung: Secondary | ICD-10-CM

## 2016-08-31 DIAGNOSIS — I998 Other disorder of circulatory system: Secondary | ICD-10-CM | POA: Insufficient documentation

## 2016-08-31 DIAGNOSIS — D721 Eosinophilia: Secondary | ICD-10-CM | POA: Diagnosis not present

## 2016-08-31 DIAGNOSIS — Z87891 Personal history of nicotine dependence: Secondary | ICD-10-CM | POA: Insufficient documentation

## 2016-08-31 DIAGNOSIS — Z87448 Personal history of other diseases of urinary system: Secondary | ICD-10-CM | POA: Insufficient documentation

## 2016-08-31 DIAGNOSIS — Z66 Do not resuscitate: Secondary | ICD-10-CM | POA: Insufficient documentation

## 2016-08-31 DIAGNOSIS — Z79899 Other long term (current) drug therapy: Secondary | ICD-10-CM

## 2016-08-31 DIAGNOSIS — Z808 Family history of malignant neoplasm of other organs or systems: Secondary | ICD-10-CM

## 2016-08-31 DIAGNOSIS — I639 Cerebral infarction, unspecified: Secondary | ICD-10-CM

## 2016-08-31 DIAGNOSIS — R51 Headache: Secondary | ICD-10-CM | POA: Diagnosis not present

## 2016-08-31 DIAGNOSIS — G893 Neoplasm related pain (acute) (chronic): Secondary | ICD-10-CM

## 2016-08-31 LAB — COMPREHENSIVE METABOLIC PANEL
ALT: 41 U/L (ref 17–63)
AST: 51 U/L — ABNORMAL HIGH (ref 15–41)
Albumin: 3.8 g/dL (ref 3.5–5.0)
Alkaline Phosphatase: 75 U/L (ref 38–126)
Anion gap: 6 (ref 5–15)
BUN: 11 mg/dL (ref 6–20)
CO2: 27 mmol/L (ref 22–32)
Calcium: 9.2 mg/dL (ref 8.9–10.3)
Chloride: 101 mmol/L (ref 101–111)
Creatinine, Ser: 0.64 mg/dL (ref 0.61–1.24)
GFR calc Af Amer: >60 mL/min (ref >60)
GFR calc non Af Amer: >60 mL/min (ref >60)
Glucose, Bld: 96 mg/dL (ref 65–99)
Potassium: 4.1 mmol/L (ref 3.5–5.1)
Sodium: 134 mmol/L — ABNORMAL LOW (ref 135–145)
Total Bilirubin: 0.6 mg/dL (ref 0.3–1.2)
Total Protein: 8.4 g/dL — ABNORMAL HIGH (ref 6.5–8.1)

## 2016-08-31 LAB — CBC WITH DIFFERENTIAL/PLATELET
Basophils Absolute: 0.1 10*3/uL (ref 0–0.1)
Basophils Relative: 3 %
Eosinophils Absolute: 0.5 10*3/uL (ref 0–0.7)
Eosinophils Relative: 10 %
HCT: 38.3 % — ABNORMAL LOW (ref 40.0–52.0)
Hemoglobin: 12.9 g/dL — ABNORMAL LOW (ref 13.0–18.0)
Lymphocytes Relative: 25 %
Lymphs Abs: 1.4 10*3/uL (ref 1.0–3.6)
MCH: 32.6 pg (ref 26.0–34.0)
MCHC: 33.6 g/dL (ref 32.0–36.0)
MCV: 96.8 fL (ref 80.0–100.0)
Monocytes Absolute: 1.1 10*3/uL — ABNORMAL HIGH (ref 0.2–1.0)
Monocytes Relative: 20 %
Neutro Abs: 2.4 10*3/uL (ref 1.4–6.5)
Neutrophils Relative %: 42 %
Platelets: 237 10*3/uL (ref 150–440)
RBC: 3.96 MIL/uL — ABNORMAL LOW (ref 4.40–5.90)
RDW: 13.5 % (ref 11.5–14.5)
WBC: 5.5 10*3/uL (ref 3.8–10.6)

## 2016-08-31 LAB — MAGNESIUM: Magnesium: 1.8 mg/dL (ref 1.7–2.4)

## 2016-08-31 MED ORDER — DIPHENHYDRAMINE HCL 50 MG/ML IJ SOLN: 25.0000 mg | Freq: Once | INTRAMUSCULAR | Status: DC | PRN

## 2016-08-31 MED ORDER — PEMBROLIZUMAB CHEMO INJECTION 100 MG/4ML
200.0000 mg | Freq: Once | INTRAVENOUS | Status: AC
  Administered 2016-08-31: 200 mg via INTRAVENOUS
  Filled 2016-08-31: qty 8

## 2016-08-31 MED ORDER — SODIUM CHLORIDE 0.9 % IV SOLN
Freq: Once | INTRAVENOUS | Status: AC
  Administered 2016-08-31: 10:00:00 via INTRAVENOUS
  Filled 2016-08-31: qty 1000

## 2016-08-31 MED ORDER — DENOSUMAB 120 MG/1.7ML ~~LOC~~ SOLN
120.0000 mg | Freq: Once | SUBCUTANEOUS | Status: AC
  Administered 2016-08-31: 120 mg via SUBCUTANEOUS
  Filled 2016-08-31: qty 1.7

## 2016-08-31 MED ORDER — SODIUM CHLORIDE 0.9 % IV SOLN
Freq: Once | INTRAVENOUS | Status: DC | PRN
  Filled 2016-08-31: qty 1000

## 2016-08-31 MED ORDER — GABAPENTIN 300 MG PO CAPS: 300.0000 mg | ORAL_CAPSULE | Freq: Every day | ORAL | 2 refills | Status: DC

## 2016-08-31 MED ORDER — OXYCODONE-ACETAMINOPHEN 5-325 MG PO TABS: 1.0000 | ORAL_TABLET | Freq: Four times a day (QID) | ORAL | 0 refills | Status: DC | PRN

## 2016-08-31 MED ORDER — EPINEPHRINE HCL 0.1 MG/ML IJ SOLN: 0.2500 mg | Freq: Once | INTRAMUSCULAR | Status: DC | PRN

## 2016-08-31 MED ORDER — METHYLPREDNISOLONE SODIUM SUCC 125 MG IJ SOLR: 125.0000 mg | Freq: Once | INTRAMUSCULAR | Status: DC | PRN

## 2016-08-31 MED ORDER — ALBUTEROL SULFATE (2.5 MG/3ML) 0.083% IN NEBU: 2.5000 mg | INHALATION_SOLUTION | Freq: Once | RESPIRATORY_TRACT | Status: DC | PRN

## 2016-08-31 MED ORDER — DIPHENHYDRAMINE HCL 50 MG/ML IJ SOLN
50.0000 mg | Freq: Once | INTRAMUSCULAR | Status: DC | PRN
Start: 2016-08-31 — End: 2016-08-31
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
## 2016-08-31 NOTE — Progress Notes (Signed)
Patient states he continues to cough. States he has pain in his chest area when he coughs.    Requesting refill forGabapentin.  Wants to discuss new pain medication. BP today 93/62 HR 96 - Sitting.  BP 90/52 HR 99 Sitting.  Patient states he is drinking more juice and coffee than water.  He is asymptomatic.

## 2016-09-02 ENCOUNTER — Other Ambulatory Visit: Payer: Self-pay | Admitting: Hematology and Oncology

## 2016-09-02 ENCOUNTER — Encounter: Payer: Self-pay | Admitting: Hematology and Oncology

## 2016-09-02 DIAGNOSIS — C3411 Malignant neoplasm of upper lobe, right bronchus or lung: Secondary | ICD-10-CM

## 2016-09-02 DIAGNOSIS — C7951 Secondary malignant neoplasm of bone: Secondary | ICD-10-CM

## 2016-09-14 ENCOUNTER — Other Ambulatory Visit: Payer: Medicare Other

## 2016-09-14 ENCOUNTER — Ambulatory Visit: Payer: Medicare Other | Admitting: Hematology and Oncology

## 2016-09-14 ENCOUNTER — Ambulatory Visit: Payer: Medicare Other

## 2016-09-20 ENCOUNTER — Inpatient Hospital Stay (HOSPITAL_BASED_OUTPATIENT_CLINIC_OR_DEPARTMENT_OTHER): Payer: Medicare Other | Admitting: Internal Medicine

## 2016-09-20 ENCOUNTER — Inpatient Hospital Stay: Payer: Medicare Other | Attending: Internal Medicine

## 2016-09-20 ENCOUNTER — Inpatient Hospital Stay: Payer: Medicare Other

## 2016-09-20 VITALS — BP 99/60 | HR 72 | Temp 97.0°F | Ht 71.5 in | Wt 133.2 lb

## 2016-09-20 DIAGNOSIS — Z79899 Other long term (current) drug therapy: Secondary | ICD-10-CM | POA: Diagnosis not present

## 2016-09-20 DIAGNOSIS — Z8709 Personal history of other diseases of the respiratory system: Secondary | ICD-10-CM | POA: Insufficient documentation

## 2016-09-20 DIAGNOSIS — R21 Rash and other nonspecific skin eruption: Secondary | ICD-10-CM | POA: Diagnosis not present

## 2016-09-20 DIAGNOSIS — N289 Disorder of kidney and ureter, unspecified: Secondary | ICD-10-CM | POA: Diagnosis not present

## 2016-09-20 DIAGNOSIS — Z801 Family history of malignant neoplasm of trachea, bronchus and lung: Secondary | ICD-10-CM | POA: Insufficient documentation

## 2016-09-20 DIAGNOSIS — M069 Rheumatoid arthritis, unspecified: Secondary | ICD-10-CM | POA: Diagnosis not present

## 2016-09-20 DIAGNOSIS — I998 Other disorder of circulatory system: Secondary | ICD-10-CM | POA: Insufficient documentation

## 2016-09-20 DIAGNOSIS — Z5112 Encounter for antineoplastic immunotherapy: Secondary | ICD-10-CM | POA: Insufficient documentation

## 2016-09-20 DIAGNOSIS — C7951 Secondary malignant neoplasm of bone: Secondary | ICD-10-CM | POA: Insufficient documentation

## 2016-09-20 DIAGNOSIS — Z808 Family history of malignant neoplasm of other organs or systems: Secondary | ICD-10-CM

## 2016-09-20 DIAGNOSIS — Z87891 Personal history of nicotine dependence: Secondary | ICD-10-CM

## 2016-09-20 DIAGNOSIS — C3411 Malignant neoplasm of upper lobe, right bronchus or lung: Secondary | ICD-10-CM

## 2016-09-20 DIAGNOSIS — G63 Polyneuropathy in diseases classified elsewhere: Secondary | ICD-10-CM

## 2016-09-20 DIAGNOSIS — G629 Polyneuropathy, unspecified: Secondary | ICD-10-CM | POA: Diagnosis not present

## 2016-09-20 LAB — CBC WITH DIFFERENTIAL/PLATELET
Basophils Absolute: 0.1 10*3/uL (ref 0–0.1)
Basophils Relative: 2 %
Eosinophils Absolute: 0.4 10*3/uL (ref 0–0.7)
Eosinophils Relative: 7 %
HCT: 37.4 % — ABNORMAL LOW (ref 40.0–52.0)
Hemoglobin: 12.7 g/dL — ABNORMAL LOW (ref 13.0–18.0)
Lymphocytes Relative: 20 %
Lymphs Abs: 1.2 10*3/uL (ref 1.0–3.6)
MCH: 32.4 pg (ref 26.0–34.0)
MCHC: 34 g/dL (ref 32.0–36.0)
MCV: 95.4 fL (ref 80.0–100.0)
Monocytes Absolute: 1 10*3/uL (ref 0.2–1.0)
Monocytes Relative: 17 %
Neutro Abs: 3.3 10*3/uL (ref 1.4–6.5)
Neutrophils Relative %: 54 %
Platelets: 187 10*3/uL (ref 150–440)
RBC: 3.92 MIL/uL — ABNORMAL LOW (ref 4.40–5.90)
RDW: 12.7 % (ref 11.5–14.5)
WBC: 6.1 10*3/uL (ref 3.8–10.6)

## 2016-09-20 LAB — COMPREHENSIVE METABOLIC PANEL
ALT: 45 U/L (ref 17–63)
AST: 61 U/L — ABNORMAL HIGH (ref 15–41)
Albumin: 3.6 g/dL (ref 3.5–5.0)
Alkaline Phosphatase: 64 U/L (ref 38–126)
Anion gap: 8 (ref 5–15)
BUN: 12 mg/dL (ref 6–20)
CO2: 23 mmol/L (ref 22–32)
Calcium: 8.6 mg/dL — ABNORMAL LOW (ref 8.9–10.3)
Chloride: 104 mmol/L (ref 101–111)
Creatinine, Ser: 0.71 mg/dL (ref 0.61–1.24)
GFR calc Af Amer: >60 mL/min (ref >60)
GFR calc non Af Amer: >60 mL/min (ref >60)
Glucose, Bld: 99 mg/dL (ref 65–99)
Potassium: 3.8 mmol/L (ref 3.5–5.1)
Sodium: 135 mmol/L (ref 135–145)
Total Bilirubin: 0.6 mg/dL (ref 0.3–1.2)
Total Protein: 7.6 g/dL (ref 6.5–8.1)

## 2016-09-20 LAB — TSH: TSH: 1.898 u[IU]/mL (ref 0.350–4.500)

## 2016-09-20 MED ORDER — CALCIUM CARBONATE-VITAMIN D 500-200 MG-UNIT PO TABS: 1.0000 | ORAL_TABLET | Freq: Two times a day (BID) | ORAL | 0 refills | Status: DC

## 2016-09-20 MED ORDER — DENOSUMAB 120 MG/1.7ML ~~LOC~~ SOLN
120.0000 mg | Freq: Once | SUBCUTANEOUS | Status: AC
  Administered 2016-09-20: 120 mg via SUBCUTANEOUS
  Filled 2016-09-20: qty 1.7

## 2016-09-20 MED ORDER — PEMBROLIZUMAB CHEMO INJECTION 100 MG/4ML
200.0000 mg | Freq: Once | INTRAVENOUS | Status: AC
  Administered 2016-09-20: 200 mg via INTRAVENOUS
  Filled 2016-09-20: qty 8

## 2016-09-20 MED ORDER — SODIUM CHLORIDE 0.9 % IV SOLN
Freq: Once | INTRAVENOUS | Status: AC
  Administered 2016-09-20: 15:00:00 via INTRAVENOUS
  Filled 2016-09-20: qty 1000

## 2016-09-20 NOTE — Progress Notes (Signed)
Patient here for pre treatment check. Patient states he is experiencing constant lower back the pain is relieved by one pain medication a day. Appetite good, no sleep problems, bowels normal.

## 2016-09-21 ENCOUNTER — Ambulatory Visit: Payer: Medicare Other

## 2016-09-21 ENCOUNTER — Other Ambulatory Visit: Payer: Medicare Other

## 2016-09-21 ENCOUNTER — Ambulatory Visit: Payer: Medicare Other | Admitting: Hematology and Oncology

## 2016-09-21 NOTE — Assessment & Plan Note (Signed)
No new bone pains Calcium normal prescribed supplemental calcium and vitamin D Xgeva today as well for bone mets- confirm schedule change for Xgeva form 4 weeks to q 3 weeeks to fall on the same day as [place].

## 2016-09-21 NOTE — Assessment & Plan Note (Signed)
Mr. Majano is doing well. He has gained appetite and weight and has had no Sob at rest or cough He did notice a rash restricted to the face. It is red and scaly and not itchy. No diarrhea  Excellent QOL without much sideffects form Keytruda. Last TSH was wnl in 07/2016 Cbc and cmp are stable Magnesium wnl  Mild facial skin eruption, advised to apply aquaphor and avoid sunlight. If rash persists, may consider doxycycline Proceed with Keytruda today as planned   He was scheduled for CT scans last week, but they were'nt done, we will reschedule them for prior to next visit.  Clinically lung cancer appears to be responding well to current therpay

## 2016-09-21 NOTE — Progress Notes (Signed)
[facility]  [phone] [phone]  ID: Gerald [name] OB: [birth year]  MR#: [id number]  IZT#:[id number]  Patient Care Team: No Pcp Per Patient as PCP - General (General Practice) Noreene Filbert, MD as Referring Physician (Radiation Oncology) Vilinda Boehringer, MD as Consulting Physician (Internal Medicine)  CHIEF COMPLAINT: follow up for right lung cancer- due for treatment today   INTERVAL HISTORY: Gerald [name] is doing well. He has gained appetite and weight and has had no Sob at rest or cough He did notice a rash restricted to the face. It is red and scaly and not itchy. No diarrhea  Excellent QOL without much sideffects form Keytruda Improved cramping pain in the legs felt to be from neuropathy. No new paresthesias or pain.  REVIEW OF SYSTEMS:   ROS  As per HPI. Otherwise, a complete review of systems is negative.  PAST MEDICAL HISTORY: Past Medical History:  Diagnosis Date  . Arthritis    Rheumatoid arthritis  . Cancer ([place])    lung ca   . Collagen vascular disease (HCC)    RA  . Mass of lung   . Pneumothorax    spontaneous  . Renal disorder    as a child    PAST SURGICAL HISTORY: Past Surgical History:  Procedure Laterality Date  . FLEXIBLE BRONCHOSCOPY N/A 03/30/2016   Procedure: FLEXIBLE BRONCHOSCOPY;  Surgeon: Vilinda Boehringer, MD;  Location: [facility] ORS;  Service: Cardiopulmonary;  Laterality: N/A;  . Ureter repair as a child      FAMILY HISTORY: Family History  Problem Relation Age of Onset  . Brain cancer Mother   . Lung cancer Maternal Uncle   . Cancer Sister     Metastatic cancer- unknown primary    ADVANCED DIRECTIVES (Y/N):  N  HEALTH MAINTENANCE: Social History  Substance Use Topics  . Smoking status: Former Smoker    Packs/day: 0.50    Years: 26.00    Types: Cigarettes    Quit date: 06/08/2000  . Smokeless tobacco: Never Used     Comment: Quit about 5 years ago  . Alcohol use 0.0 oz/week     Comment:  occasionally beer     Colonoscopy:  PAP:  Bone density:  Lipid panel:  Allergies  Allergen Reactions  . Nsaids Other (See Comments)    Pt is unable to take this class of medications.    . Sulfa Antibiotics Other (See Comments)    Reaction:  GI upset     Current Outpatient Prescriptions  Medication Sig Dispense Refill  . gabapentin (NEURONTIN) 300 MG capsule Take 1 capsule (300 mg total) by mouth at bedtime. 30 capsule 2  . HYDROcodone-acetaminophen (NORCO) 7.5-325 MG tablet Take 1 tablet by mouth every 4 (four) hours as needed for moderate pain (Every 4-6 hours as needed for pain). 30 tablet 0  . ondansetron (ZOFRAN) 8 MG tablet Take 1 tablet (8 mg total) by mouth 2 (two) times daily as needed for nausea or vomiting. 20 tablet 0  . oxyCODONE-acetaminophen (PERCOCET/ROXICET) 5-325 MG tablet Take 1 tablet by mouth every 6 (six) hours as needed for severe pain. 30 tablet 0  . sucralfate (CARAFATE) 1 g tablet Take 1 tablet (1 g total) by mouth 3 (three) times daily. Dissolve in 2-3 tbsp warm water, swish and swallow. 90 tablet 3  . calcium-vitamin D (OSCAL WITH D) 500-200 MG-UNIT tablet Take 1 tablet by mouth 2 (two) times daily. 180 tablet 0   No current facility-administered medications for this visit.  OBJECTIVE: Vitals:   09/20/16 1347  BP: 99/60  Pulse: 72  Temp: 97 F (36.1 C)     Body mass index is 18.31 kg/m.   1.75 meters squared  ECOG FS:0 - Asymptomatic  Physical Exam  Constitutional: He is oriented to person, place, and time. He appears well-developed and well-nourished.  HENT:  Head: Normocephalic and atraumatic.  Nose: Nose normal.  Eyes: Conjunctivae and EOM are normal. Pupils are equal, round, and reactive to light.  Neck: Normal range of motion. Neck supple.  Cardiovascular: Normal rate and regular rhythm.   Pulmonary/Chest: Effort normal and breath sounds normal.  Abdominal: Soft. Bowel sounds are normal.  Musculoskeletal: Normal range of motion. He  exhibits no edema, tenderness or deformity.  Neurological: He is alert and oriented to person, place, and time.  Skin: Skin is warm. Rash noted. No erythema.  eryhtematpous facial rash noted  Psychiatric: He has a normal mood and affect. His behavior is normal. Judgment and thought content normal.     LAB RESULTS:  Lab Results  Component Value Date   NA 135 09/20/2016   K 3.8 09/20/2016   CL 104 09/20/2016   CO2 23 09/20/2016   GLUCOSE 99 09/20/2016   BUN 12 09/20/2016   CREATININE 0.71 09/20/2016   CALCIUM 8.6 (L) 09/20/2016   PROT 7.6 09/20/2016   ALBUMIN 3.6 09/20/2016   AST 61 (H) 09/20/2016   ALT 45 09/20/2016   ALKPHOS 64 09/20/2016   BILITOT 0.6 09/20/2016   GFRNONAA >60 09/20/2016   GFRAA >60 09/20/2016    Lab Results  Component Value Date   WBC 6.1 09/20/2016   NEUTROABS 3.3 09/20/2016   HGB 12.7 (L) 09/20/2016   HCT 37.4 (L) 09/20/2016   MCV 95.4 09/20/2016   PLT 187 09/20/2016     STUDIES: No results found.  ASSESSMENT:  Primary cancer of right upper lobe of lung ([place]) Gerald [name] is doing well. He has gained appetite and weight and has had no Sob at rest or cough He did notice a rash restricted to the face. It is red and scaly and not itchy. No diarrhea  Excellent QOL without much sideffects form Keytruda. Last TSH was wnl in 07/2016 Cbc and cmp are stable Magnesium wnl  Mild facial skin eruption, advised to apply aquaphor and avoid sunlight. If rash persists, may consider doxycycline Proceed with Keytruda today as planned   He was scheduled for CT scans last week, but they were'nt done, we will reschedule them for prior to next visit.  Clinically lung cancer appears to be responding well to current therpay    Bone metastasis ([place]) No new bone pains Calcium normal prescribed supplemental calcium and vitamin D Xgeva today as well for bone mets- confirm schedule change for Xgeva form 4 weeks to q 3 weeeks to fall on the same day as  [place].    Peripheral neuropathy (HCC) Improved cramping pain in the legs felt to be from neuropathy. No new paresthesias or pain. He reports neuropathy being present prior to onset of chemotherapy, therefore, I suspect this could be paraneoplastic in origin rather than drug- induced.     PLAN:    Patient expressed understanding and was in agreement with this plan. He also understands that He can call clinic at any time with any questions, concerns, or complaints.  Orders Placed This Encounter  Procedures  . TREATMENT CONDITIONS    Patient should have CBC & CMP within 7 days prior to chemotherapy administration. NOTIFY  MD IF: ANC < 1500, Hemoglobin < 8, PLT < 100,000,  Total Bili > 1.5, Creatinine > 1.5, ALT & AST > 80 or if patient has unstable vital signs: Temperature > 38.5, SBP > 180 or < 90, RR > 30 or HR > 100.    No Follow-up on file. Primary cancer of right upper lobe of lung ([place])   Staging form: Lung, AJCC 7th Edition   - Clinical stage from 03/30/2016: Stage IV (T4, N3, M1b) - Signed by Lequita Asal, MD on 04/09/2016   - Pathologic: No stage assigned - Unsigned   - Pathologic: No stage assigned - Michaela Corner, MD   09/21/2016 2:38 PM

## 2016-09-21 NOTE — Assessment & Plan Note (Addendum)
Improved cramping pain in the legs felt to be from neuropathy. No new paresthesias or pain. He reports neuropathy being present prior to onset of chemotherapy, therefore, I suspect this could be paraneoplastic in origin rather than drug- induced.

## 2016-09-25 ENCOUNTER — Telehealth: Payer: Self-pay | Admitting: *Deleted

## 2016-09-25 NOTE — Telephone Encounter (Signed)
Called to let pt know that his calcium was a little low last week and Dr. Mike Gip wanted to know if he was taking calcium twice a day and he states he wan't earlier in the week last week but he started back at end of  The week.  Told him that with him getting xgeva it can dec. calicum level so we want him to increase to 1 calcium tid and then recheck level this Friday and he agreeable to lab check at 2:30. And adding another calcium chew to his regimen.

## 2016-09-29 ENCOUNTER — Inpatient Hospital Stay: Payer: Medicare Other

## 2016-09-29 DIAGNOSIS — Z5112 Encounter for antineoplastic immunotherapy: Secondary | ICD-10-CM | POA: Diagnosis not present

## 2016-09-29 DIAGNOSIS — G629 Polyneuropathy, unspecified: Secondary | ICD-10-CM | POA: Diagnosis not present

## 2016-09-29 DIAGNOSIS — C7951 Secondary malignant neoplasm of bone: Secondary | ICD-10-CM | POA: Diagnosis not present

## 2016-09-29 DIAGNOSIS — Z79899 Other long term (current) drug therapy: Secondary | ICD-10-CM | POA: Diagnosis not present

## 2016-09-29 DIAGNOSIS — R21 Rash and other nonspecific skin eruption: Secondary | ICD-10-CM | POA: Diagnosis not present

## 2016-09-29 DIAGNOSIS — C3411 Malignant neoplasm of upper lobe, right bronchus or lung: Secondary | ICD-10-CM | POA: Diagnosis not present

## 2016-09-29 LAB — CALCIUM: Calcium: 9.3 mg/dL (ref 8.9–10.3)

## 2016-10-11 ENCOUNTER — Inpatient Hospital Stay: Payer: Medicare Other

## 2016-10-11 ENCOUNTER — Other Ambulatory Visit: Payer: Self-pay | Admitting: *Deleted

## 2016-10-11 DIAGNOSIS — Z85118 Personal history of other malignant neoplasm of bronchus and lung: Secondary | ICD-10-CM

## 2016-10-12 ENCOUNTER — Inpatient Hospital Stay (HOSPITAL_BASED_OUTPATIENT_CLINIC_OR_DEPARTMENT_OTHER): Payer: Medicare Other | Admitting: Hematology and Oncology

## 2016-10-12 ENCOUNTER — Inpatient Hospital Stay: Payer: Medicare Other

## 2016-10-12 ENCOUNTER — Encounter: Payer: Self-pay | Admitting: Hematology and Oncology

## 2016-10-12 ENCOUNTER — Inpatient Hospital Stay: Payer: Medicare Other | Attending: Internal Medicine

## 2016-10-12 ENCOUNTER — Other Ambulatory Visit: Payer: Self-pay | Admitting: Hematology and Oncology

## 2016-10-12 VITALS — BP 119/61 | HR 87 | Temp 97.7°F | Resp 18 | Wt 129.2 lb

## 2016-10-12 DIAGNOSIS — I998 Other disorder of circulatory system: Secondary | ICD-10-CM | POA: Diagnosis not present

## 2016-10-12 DIAGNOSIS — Z87448 Personal history of other diseases of urinary system: Secondary | ICD-10-CM | POA: Insufficient documentation

## 2016-10-12 DIAGNOSIS — Z801 Family history of malignant neoplasm of trachea, bronchus and lung: Secondary | ICD-10-CM | POA: Diagnosis not present

## 2016-10-12 DIAGNOSIS — M069 Rheumatoid arthritis, unspecified: Secondary | ICD-10-CM | POA: Insufficient documentation

## 2016-10-12 DIAGNOSIS — K769 Liver disease, unspecified: Secondary | ICD-10-CM | POA: Diagnosis not present

## 2016-10-12 DIAGNOSIS — Z8701 Personal history of pneumonia (recurrent): Secondary | ICD-10-CM | POA: Diagnosis not present

## 2016-10-12 DIAGNOSIS — C3411 Malignant neoplasm of upper lobe, right bronchus or lung: Secondary | ICD-10-CM

## 2016-10-12 DIAGNOSIS — Z66 Do not resuscitate: Secondary | ICD-10-CM

## 2016-10-12 DIAGNOSIS — D721 Eosinophilia: Secondary | ICD-10-CM | POA: Diagnosis not present

## 2016-10-12 DIAGNOSIS — Z79899 Other long term (current) drug therapy: Secondary | ICD-10-CM | POA: Diagnosis not present

## 2016-10-12 DIAGNOSIS — R21 Rash and other nonspecific skin eruption: Secondary | ICD-10-CM | POA: Diagnosis not present

## 2016-10-12 DIAGNOSIS — C7951 Secondary malignant neoplasm of bone: Secondary | ICD-10-CM

## 2016-10-12 DIAGNOSIS — Z87891 Personal history of nicotine dependence: Secondary | ICD-10-CM

## 2016-10-12 DIAGNOSIS — R5383 Other fatigue: Secondary | ICD-10-CM

## 2016-10-12 DIAGNOSIS — M129 Arthropathy, unspecified: Secondary | ICD-10-CM

## 2016-10-12 DIAGNOSIS — Z85118 Personal history of other malignant neoplasm of bronchus and lung: Secondary | ICD-10-CM

## 2016-10-12 DIAGNOSIS — Z77090 Contact with and (suspected) exposure to asbestos: Secondary | ICD-10-CM

## 2016-10-12 DIAGNOSIS — Z8673 Personal history of transient ischemic attack (TIA), and cerebral infarction without residual deficits: Secondary | ICD-10-CM | POA: Insufficient documentation

## 2016-10-12 DIAGNOSIS — G629 Polyneuropathy, unspecified: Secondary | ICD-10-CM

## 2016-10-12 DIAGNOSIS — D72829 Elevated white blood cell count, unspecified: Secondary | ICD-10-CM | POA: Diagnosis not present

## 2016-10-12 DIAGNOSIS — J069 Acute upper respiratory infection, unspecified: Secondary | ICD-10-CM | POA: Diagnosis not present

## 2016-10-12 DIAGNOSIS — R590 Localized enlarged lymph nodes: Secondary | ICD-10-CM | POA: Insufficient documentation

## 2016-10-12 DIAGNOSIS — Z808 Family history of malignant neoplasm of other organs or systems: Secondary | ICD-10-CM

## 2016-10-12 DIAGNOSIS — Z5112 Encounter for antineoplastic immunotherapy: Secondary | ICD-10-CM | POA: Insufficient documentation

## 2016-10-12 DIAGNOSIS — R51 Headache: Secondary | ICD-10-CM

## 2016-10-12 DIAGNOSIS — C787 Secondary malignant neoplasm of liver and intrahepatic bile duct: Secondary | ICD-10-CM | POA: Diagnosis not present

## 2016-10-12 LAB — CBC WITH DIFFERENTIAL/PLATELET
Basophils Absolute: 0.1 10*3/uL (ref 0–0.1)
Basophils Relative: 2 %
Eosinophils Absolute: 0.4 10*3/uL (ref 0–0.7)
Eosinophils Relative: 6 %
HCT: 41.5 % (ref 40.0–52.0)
Hemoglobin: 13.9 g/dL (ref 13.0–18.0)
Lymphocytes Relative: 24 %
Lymphs Abs: 1.6 10*3/uL (ref 1.0–3.6)
MCH: 31.9 pg (ref 26.0–34.0)
MCHC: 33.5 g/dL (ref 32.0–36.0)
MCV: 95.2 fL (ref 80.0–100.0)
Monocytes Absolute: 1.1 10*3/uL — ABNORMAL HIGH (ref 0.2–1.0)
Monocytes Relative: 16 %
Neutro Abs: 3.7 10*3/uL (ref 1.4–6.5)
Neutrophils Relative %: 52 %
Platelets: 195 10*3/uL (ref 150–440)
RBC: 4.36 MIL/uL — ABNORMAL LOW (ref 4.40–5.90)
RDW: 13.1 % (ref 11.5–14.5)
WBC: 6.9 10*3/uL (ref 3.8–10.6)

## 2016-10-12 LAB — TSH: TSH: 1.653 u[IU]/mL (ref 0.350–4.500)

## 2016-10-12 LAB — COMPREHENSIVE METABOLIC PANEL
ALT: 48 U/L (ref 17–63)
AST: 64 U/L — ABNORMAL HIGH (ref 15–41)
Albumin: 4 g/dL (ref 3.5–5.0)
Alkaline Phosphatase: 67 U/L (ref 38–126)
Anion gap: 7 (ref 5–15)
BUN: 19 mg/dL (ref 6–20)
CO2: 27 mmol/L (ref 22–32)
Calcium: 9.5 mg/dL (ref 8.9–10.3)
Chloride: 103 mmol/L (ref 101–111)
Creatinine, Ser: 0.72 mg/dL (ref 0.61–1.24)
GFR calc Af Amer: >60 mL/min (ref >60)
GFR calc non Af Amer: >60 mL/min (ref >60)
Glucose, Bld: 117 mg/dL — ABNORMAL HIGH (ref 65–99)
Potassium: 4.1 mmol/L (ref 3.5–5.1)
Sodium: 137 mmol/L (ref 135–145)
Total Bilirubin: 0.6 mg/dL (ref 0.3–1.2)
Total Protein: 8.3 g/dL — ABNORMAL HIGH (ref 6.5–8.1)

## 2016-10-12 MED ORDER — SODIUM CHLORIDE 0.9 % IV SOLN
200.0000 mg | Freq: Once | INTRAVENOUS | Status: AC
  Administered 2016-10-12: 200 mg via INTRAVENOUS
  Filled 2016-10-12: qty 8

## 2016-10-12 MED ORDER — SODIUM CHLORIDE 0.9 % IV SOLN
Freq: Once | INTRAVENOUS | Status: AC
  Administered 2016-10-12: 16:00:00 via INTRAVENOUS
  Filled 2016-10-12: qty 1000

## 2016-10-12 NOTE — Progress Notes (Signed)
Patient states when he coughs he still has pain at the area where his mass is on the right side.  Once in a while gets a headache.

## 2016-10-12 NOTE — Progress Notes (Signed)
[facility] day:  10/12/16  Chief Complaint: Gerald [name] is a 65 y.o. male with metastatic lung cancer and associated hypereosinophilia who is seen for assessment prior to cycle #5 Keytruda.  HPI:  The patient was last seen by me in the medical oncology clinic on 08/31/2016.  At that time, he had a chronic mild cough.  Weight was stable.  Head MRI revealed multiple areas of resolving subacute infarcts.  There was no evidence of metastatic disease or acute infarction.  He received cycle #3 Keytruda and monthly Xgeva.  He was seen by Dr. Sherrine Maples in my absence on 09/20/2016.  He noted cramping in his legs felt due to neuropathy.  He had gained weight.  He denied any shortness of breath or cough. He had a mild facial skin eruption and was advised to apply Aquaphor and avoid sunlight.  Restaging studies were rescheduled.  He received cycle #4 Keytruda.  He received Xgeva.  Calcium was 8.6.  His oral calcium was increased. Repeat check on 09/29/2016 was 9.3.  Symptomatically, he notes 2 days of being lethargic after each infusion. He states that the rash on his face is slight.  His neck is a little red today as he used an Copy.  He notes a little neuropathy in the top of his left foot up to his ankle. He has an occasional headache.   Past Medical History:  Diagnosis Date  . Arthritis    Rheumatoid arthritis  . Cancer ([place])    lung ca   . Collagen vascular disease ([facility])    RA  . Mass of lung   . Pneumothorax    spontaneous  . Renal disorder    as a child    Past Surgical History:  Procedure Laterality Date  . FLEXIBLE BRONCHOSCOPY N/A 03/30/2016   Procedure: FLEXIBLE BRONCHOSCOPY;  Surgeon: Vilinda Boehringer, MD;  Location: [facility] ORS;  Service: Cardiopulmonary;  Laterality: N/A;  . Ureter repair as a child      Family History  Problem Relation Age of Onset  . Brain cancer Mother   . Lung cancer Maternal Uncle   . Cancer Sister      Metastatic cancer- unknown primary    Social History:  reports that he quit smoking about 16 years ago. His smoking use included Cigarettes. He has a 13.00 pack-year smoking history. He has never used smokeless tobacco. He reports that he drinks alcohol. He reports that he does not use drugs.  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  He will be getting a refrigerator soon.  He lives in [place].  Contact number is [phone].  The patient is accompanied by his daughter today.  Allergies:  Allergies  Allergen Reactions  . Nsaids Other (See Comments)    Pt is unable to take this class of medications.    . Sulfa Antibiotics Other (See Comments)    Reaction:  GI upset     Current Medications: Current Outpatient Prescriptions  Medication Sig Dispense Refill  . calcium-vitamin D (OSCAL WITH D) 500-200 MG-UNIT tablet Take 1 tablet by mouth 2 (two) times daily. 180 tablet 0  . gabapentin (NEURONTIN) 300 MG capsule Take 1 capsule (300 mg total) by mouth at bedtime. 30 capsule 2  . HYDROcodone-acetaminophen (NORCO) 7.5-325 MG tablet Take 1 tablet by mouth every 4 (four) hours as needed for moderate pain (Every 4-6 hours as  needed for pain). 30 tablet 0  . oxyCODONE-acetaminophen (PERCOCET/ROXICET) 5-325 MG tablet Take 1 tablet by mouth every 6 (six) hours as needed for severe pain. 30 tablet 0  . ondansetron (ZOFRAN) 8 MG tablet Take 1 tablet (8 mg total) by mouth 2 (two) times daily as needed for nausea or vomiting. (Patient not taking: Reported on 10/12/2016) 20 tablet 0  . sucralfate (CARAFATE) 1 g tablet Take 1 tablet (1 g total) by mouth 3 (three) times daily. Dissolve in 2-3 tbsp warm water, swish and swallow. (Patient not taking: Reported on 10/12/2016) 90 tablet 3   No current facility-administered medications for this visit.     Review of Systems:  GENERAL:  Feels "good". No fever, chills or sweats.  Weight down 4  pounds. PERFORMANCE STATUS (ECOG):  2 HEENT: No visual changes, sore throat, mouth sores or tenderness. Lungs:  No shortness of breath.  Dry cough.  No hemoptysis. Cardiac:  No chest pain, palpitations, orthopnea, or PND.  Sleeps in a recliner. GI:  Appetite good.  No nausea, vomiting, diarrhea, constipation, melena or hematochezia. GU:  No urgency, frequency, dysuria, or hematuria. Musculoskeletal:  Pain in right knee.  No back pain.  No muscle tenderness. Extremities:  No pain or swelling. Skin:  No rashes or skin changes. 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  No headache, focal weakness, balance or coordination issues. Endocrine:  No diabetes, thyroid issues, hot flashes or night sweats. Psych:  No mood changes, depression or anxiety. Pain:  No pain.  Review of systems:  All other systems reviewed and found to be negative.  Physical Exam: Blood pressure 119/61, pulse 87, temperature 97.7 F (36.5 C), temperature source Tympanic, resp. rate 18, weight 129 lb 3 oz (58.6 kg). GENERAL:  Thin gentleman sitting comfortably in the exam room in no acute distress. MENTAL STATUS:  Alert and oriented to person, place and time. HEAD:  Wearing a camo cap.  Long gray hair in pony tail.  Goatee.  Temporal wasting.  Normocephalic, atraumatic, face symmetric, no Cushingoid features. EYES:  Blue eyes.  Pupils equal round and reactive to light and accomodation.  No conjunctivitis or scleral icterus. ENT:  Oropharynx clear without lesion.  Tongue normal. Mucous membranes moist.  RESPIRATORY:  Clear to auscultation without rales, wheezes or rhonchi. CARDIOVASCULAR:  Regular rate and rhythm without murmur, rub or gallop. ABDOMEN:  Soft, non-tender, with active bowel sounds, and no hepatosplenomegaly.  No masses. SKIN:  Razor irritation on face.  No rashes, ulcers or lesions. EXTREMITIES: No edema, no skin discoloration or tenderness.  No palpable  cords. LYMPH NODES: No palpable cervical, supraclavicular, axillary or inguinal adenopathy  NEUROLOGICAL: Unremarkable. PSYCH:  Appropriate.    Appointment on 10/12/2016  Component Date Value Ref Range Status  . WBC 10/12/2016 6.9  3.8 - 10.6 K/uL Final  . RBC 10/12/2016 4.36* 4.40 - 5.90 MIL/uL Final  . Hemoglobin 10/12/2016 13.9  13.0 - 18.0 g/dL Final  . HCT 10/12/2016 41.5  40.0 - 52.0 % Final  . MCV 10/12/2016 95.2  80.0 - 100.0 fL Final  . MCH 10/12/2016 31.9  26.0 - 34.0 pg Final  . MCHC 10/12/2016 33.5  32.0 - 36.0 g/dL Final  . RDW 10/12/2016 13.1  11.5 - 14.5 % Final  . Platelets 10/12/2016 195  150 - 440 K/uL Final  . Neutrophils Relative % 10/12/2016 52  % Final  . Neutro Abs 10/12/2016 3.7  1.4 -  6.5 K/uL Final  . Lymphocytes Relative 10/12/2016 24  % Final  . Lymphs Abs 10/12/2016 1.6  1.0 - 3.6 K/uL Final  . Monocytes Relative 10/12/2016 16  % Final  . Monocytes Absolute 10/12/2016 1.1* 0.2 - 1.0 K/uL Final  . Eosinophils Relative 10/12/2016 6  % Final  . Eosinophils Absolute 10/12/2016 0.4  0 - 0.7 K/uL Final  . Basophils Relative 10/12/2016 2  % Final  . Basophils Absolute 10/12/2016 0.1  0 - 0.1 K/uL Final  . Sodium 10/12/2016 137  135 - 145 mmol/L Final  . Potassium 10/12/2016 4.1  3.5 - 5.1 mmol/L Final  . Chloride 10/12/2016 103  101 - 111 mmol/L Final  . CO2 10/12/2016 27  22 - 32 mmol/L Final  . Glucose, Bld 10/12/2016 117* 65 - 99 mg/dL Final  . BUN 10/12/2016 19  6 - 20 mg/dL Final  . Creatinine, Ser 10/12/2016 0.72  0.61 - 1.24 mg/dL Final  . Calcium 10/12/2016 9.5  8.9 - 10.3 mg/dL Final  . Total Protein 10/12/2016 8.3* 6.5 - 8.1 g/dL Final  . Albumin 10/12/2016 4.0  3.5 - 5.0 g/dL Final  . AST 10/12/2016 64* 15 - 41 U/L Final  . ALT 10/12/2016 48  17 - 63 U/L Final  . Alkaline Phosphatase 10/12/2016 67  38 - 126 U/L Final  . Total Bilirubin 10/12/2016 0.6  0.3 - 1.2 mg/dL Final  . GFR calc non Af Amer 10/12/2016 >60  >60 mL/min Final  . GFR calc  Af Amer 10/12/2016 >60  >60 mL/min Final   Comment: (NOTE) The eGFR has been calculated using the CKD EPI equation. This calculation has not been validated in all clinical situations. eGFR's persistently <60 mL/min signify possible Chronic Kidney Disease.   . Anion gap 10/12/2016 7  5 - 15 Final    Assessment:  Gerald [name] is a 65 y.o. male with stage IV non-small cell lung cancer and associated leukocytosis with eosinophilia. He presented with a 30 pound weight loss, shortness of breath, cough, and left leg discomfort. He has a 13-15 pack year smoking history.  Chest CT angiogram on 03/27/2016 revealed an 8 cm mass in the medial right upper lobe. The mass abutted the mediastinum and possibly invaded the left atrium. There was associated widespread thoracic adenopathy. There was a 2.6 cm enhancing lesion in the medial segment of the left hepatic lobe.  Bronchoscopy on 03/30/2016 revealed an endobronchial lesion in the RUL anterior segment with 95% occlusion. There was extrinsic compression in the right middle lobe secondary to posterior mass effect. Pathology confirmed non-small cell lung cancer, favor adenocarcinoma.  PD-L1 testing revealed high expression (> 50%).  CEA was 2.3 on 03/27/2016.  PET scan on 04/05/2016 revealed intense hypermetabolism (SUV 17.95) associated with a 7.5 cm central perihilar right lung lesion. There was mediastinal (sub-carinal node SUV 8.89; 2.6 cm right paratracheal node SUV 17.49) and upper abdominal retroperitoneal hypermetabolic adenopathy (9 mm aortocaval node SUV 9.2). There were 2 liver metastasis (2.3 cm left lobe SUV 6.19; 2.2 cm segment VIII SUV 5.18). There are multifocal bone metastasis (C2 vertebral body SUV 9.0; 1.6 cm left sacral ala lesion SUV of 8.6).   Head MRI on 03/31/2016 was indeterminate for early metastatic disease to the brain. There was a small focus of gyral enhancement in the anterior right frontal lobe likely is post ischemic,  while a small posterior right cerebellar enhancing lesion was more suspicious for small brain metastasis. There were multiple small primarily  subacute appearing infarcts in the bilateral MCA and left PICA territories.   Head MRI on 05/12/2016 revealed expected interval evolution of bilateral cerebral in cerebellar infarcts. There was resolution of right frontal and right cerebellar enhancement consistent with subacute infarcts. There was a new 3 mm focus of gyral enhancement in the left occipital lobe and a possible new 4 mm enhancing lesion in the left cerebellum (? vascular enhancement versus tiny metastasis).   Head MRI on 08/16/2016 revealed multiple areas of improving enhancement and restricted diffusion compatible with resolving subacute infarcts.  There was no evidence of metastatic disease or acute infarction   Bone scan on 04/07/2016 revealed a subtle distal right femur lesion which might be a small metastasis.  Bone scan is felt to be an unreliable modality for evaluation of osseous metastatic disease as the bone metastases seen on the recent PET and MRI were not evident.  He receives [place] monthly (last 09/20/2016).  He has marked leukocytosis with hypereosinophilia. Initial WBC was 72,300 with 53% eosinophils. Bone marrow on 03/29/2016 revealed marked increase in marrow eosinophils (approximate 30%). There was no diagnostic morphologic evidence of a myeloproliferative or lymphoid neoplasm. Flow cytometry revealed significant increase in eosinophils (54%). There was relative decreased myeloid cells with no significant immunophenotypic abnormalities or increase in blasts. There was no lymphoid abnormalities or evidence of clonality. FISH studies were negative for myeloproliferative neoplasms with eosinophilia (PDGFRA, PDGFRB, FGFR1). BCR-ABL was negative on 03/28/2016.  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  Left lower  extremity duplex on 03/25/2016 revealed no evidence of DVT. Discomfort improved on Neurontin 300 mg a day and continues to improve with declining eosinophilia.  He received 41.4 Gy from 04/11/2016 - 05/23/2016.  He received 4 weeks of concurrent carboplatin and Taxol (04/17/2016 - 05/22/2016).  He has received 4 cycles of Keytruda (07/03/2016 - 09/20/2016).  He was diagnosed with aspiration pneumonia.  Chest CT on 06/16/2016 revealed multifocal airspace opacities within the right lung which had progressed since the prior CT, suspicious for postobstructive and/or aspiration pneumonia. There was a cavitary component in the right upper lobe posteriorly.  The dominant right upper lobe mass and the hilar adenopathy had improved.  He received 10 days of Levaquin.  He completed a course of Augmentin.  Symptomatically, he feels good.  He has a mild neuropathy in his left foot.  Weight is stable.  Code status is DNR/DNI.  Plan: 1.  Labs today:  CBC with diff, CMP, TSH. 2.  Cycle #5 Keytruda. 3.  Discuss plans for [facility] every other treatment. 4.  Discuss missed restaging studies. 5.  Reschedule CT scans prior to next cycle. 6.  RTC in 3 weeks for for MD assessment, labs (CBC with diff, CMP, Mg, TSH), Xgeva, and cycle #6 Keytruda.   Lequita Asal, MD  10/12/2016, 2:50 PM

## 2016-10-16 ENCOUNTER — Other Ambulatory Visit: Payer: Self-pay | Admitting: *Deleted

## 2016-10-16 DIAGNOSIS — C3411 Malignant neoplasm of upper lobe, right bronchus or lung: Secondary | ICD-10-CM

## 2016-10-16 DIAGNOSIS — C7951 Secondary malignant neoplasm of bone: Secondary | ICD-10-CM

## 2016-10-19 ENCOUNTER — Ambulatory Visit
Admission: RE | Admit: 2016-10-19 | Discharge: 2016-10-19 | Disposition: A | Discharge status: Home / Self Care | Payer: Medicare Other | Source: Ambulatory Visit | Attending: Hematology and Oncology | Admitting: Hematology and Oncology

## 2016-10-19 DIAGNOSIS — C787 Secondary malignant neoplasm of liver and intrahepatic bile duct: Secondary | ICD-10-CM | POA: Diagnosis not present

## 2016-10-19 DIAGNOSIS — J189 Pneumonia, unspecified organism: Secondary | ICD-10-CM | POA: Insufficient documentation

## 2016-10-19 DIAGNOSIS — C3490 Malignant neoplasm of unspecified part of unspecified bronchus or lung: Secondary | ICD-10-CM | POA: Diagnosis not present

## 2016-10-19 DIAGNOSIS — R935 Abnormal findings on diagnostic imaging of other abdominal regions, including retroperitoneum: Secondary | ICD-10-CM | POA: Diagnosis not present

## 2016-10-19 DIAGNOSIS — C3411 Malignant neoplasm of upper lobe, right bronchus or lung: Secondary | ICD-10-CM | POA: Insufficient documentation

## 2016-10-19 MED ORDER — IOPAMIDOL (ISOVUE-300) INJECTION 61%: 100.0000 mL | Freq: Once | INTRAVENOUS | Status: DC | PRN

## 2016-10-19 MED ORDER — IOPAMIDOL (ISOVUE-300) INJECTION 61%
100.0000 mL | Freq: Once | INTRAVENOUS | Status: AC | PRN
  Administered 2016-10-19: 100 mL via INTRAVENOUS

## 2016-10-30 ENCOUNTER — Ambulatory Visit
Admission: RE | Admit: 2016-10-30 | Discharge: 2016-10-30 | Disposition: A | Discharge status: Home / Self Care | Payer: Medicare Other | Source: Ambulatory Visit | Attending: Radiation Oncology | Admitting: Radiation Oncology

## 2016-10-30 ENCOUNTER — Encounter: Payer: Self-pay | Admitting: Radiation Oncology

## 2016-10-30 VITALS — BP 134/69 | HR 106 | Temp 97.1°F | Resp 22 | Wt 132.2 lb

## 2016-10-30 DIAGNOSIS — Z923 Personal history of irradiation: Secondary | ICD-10-CM | POA: Insufficient documentation

## 2016-10-30 DIAGNOSIS — C787 Secondary malignant neoplasm of liver and intrahepatic bile duct: Secondary | ICD-10-CM | POA: Insufficient documentation

## 2016-10-30 DIAGNOSIS — C3411 Malignant neoplasm of upper lobe, right bronchus or lung: Secondary | ICD-10-CM | POA: Diagnosis present

## 2016-10-30 DIAGNOSIS — Z87891 Personal history of nicotine dependence: Secondary | ICD-10-CM | POA: Diagnosis not present

## 2016-10-30 DIAGNOSIS — Z9221 Personal history of antineoplastic chemotherapy: Secondary | ICD-10-CM | POA: Diagnosis not present

## 2016-10-30 DIAGNOSIS — R590 Localized enlarged lymph nodes: Secondary | ICD-10-CM | POA: Insufficient documentation

## 2016-10-30 DIAGNOSIS — C7951 Secondary malignant neoplasm of bone: Secondary | ICD-10-CM | POA: Insufficient documentation

## 2016-10-30 NOTE — Progress Notes (Signed)
Radiation Oncology Follow up Note  Name: Gerald [name] [facility]   Date:   10/30/2016 MRN:  [id number] DOB: 1951-11-13    This 65 y.o. male presents to the clinic today for 5 month follow-up status post radiation therapy for stage IV non-small cell lung cancer of the right upper lobe with SVC.  REFERRING PROVIDER: No ref. provider found  HPI: Patient is a 65 year old male now seen out 5 months having completed radiation therapy to his chest for at that time at least stage IIIB non-small cell lung cancer of the right upper lobe with SVC. He received concurrent chemotherapy with carboplatinum and Taxol and radiation therapy. He is seen today 5 months out is doing fairly well. He currently is on Keytruda tolerating that well with good response.Marland Kitchen His most recent CT scan performed November 9 shows radiation changes in the right lung with improvement in the entire chest. He does unfortunately have progression of hepatic metastasis. He also has right perihilar mass improving and as well thoracic lymphadenopathy improving. He specifically denies cough shortness of breath or significant dyspnea on exertion.  COMPLICATIONS OF TREATMENT: none  FOLLOW UP COMPLIANCE: keeps appointments   PHYSICAL EXAM:  BP 134/69   Pulse (!) 106   Temp 97.1 F (36.2 C)   Resp (!) 22   Wt 132 lb 2.7 oz (59.9 kg)   BMI 18.18 kg/m  Thin slightly cachectic male in NAD. Well-developed well-nourished patient in NAD. HEENT reveals PERLA, EOMI, discs not visualized.  Oral cavity is clear. No oral mucosal lesions are identified. Neck is clear without evidence of cervical or supraclavicular adenopathy. Lungs are clear to A&P. Cardiac examination is essentially unremarkable with regular rate and rhythm without murmur rub or thrill. Abdomen is benign with no organomegaly or masses noted. Motor sensory and DTR levels are equal and symmetric in the upper and lower extremities. Cranial nerves II through XII are grossly intact. Proprioception  is intact. No peripheral adenopathy or edema is identified. No motor or sensory levels are noted. Crude visual fields are within normal range.  RADIOLOGY RESULTS: Most recent CT scan is reviewed and compared to prior studies  PLAN: Present time patient is doing well 5 months out from combined modality treatment. He now has stage IV disease with aggression in his liver. He seems to be responding and tolerating Keytruda well. He continues close follow-up care with medical oncology. I have asked to see him back in 6 months for follow-up. He does have known bone metastasis I've asked him to notify medical oncology should these become painful as we may consider palliative radiation to these areas.  I would like to take this opportunity to thank you for allowing me to participate in the care of your patient.Armstead Peaks., MD

## 2016-11-07 ENCOUNTER — Inpatient Hospital Stay: Payer: Medicare Other

## 2016-11-07 ENCOUNTER — Encounter: Payer: Self-pay | Admitting: Hematology and Oncology

## 2016-11-07 ENCOUNTER — Telehealth: Payer: Self-pay | Admitting: *Deleted

## 2016-11-07 ENCOUNTER — Inpatient Hospital Stay (HOSPITAL_BASED_OUTPATIENT_CLINIC_OR_DEPARTMENT_OTHER): Payer: Medicare Other | Admitting: Hematology and Oncology

## 2016-11-07 VITALS — BP 105/67 | HR 94 | Temp 96.6°F | Resp 18 | Wt 132.7 lb

## 2016-11-07 DIAGNOSIS — G629 Polyneuropathy, unspecified: Secondary | ICD-10-CM

## 2016-11-07 DIAGNOSIS — C3411 Malignant neoplasm of upper lobe, right bronchus or lung: Secondary | ICD-10-CM

## 2016-11-07 DIAGNOSIS — R5383 Other fatigue: Secondary | ICD-10-CM

## 2016-11-07 DIAGNOSIS — C787 Secondary malignant neoplasm of liver and intrahepatic bile duct: Secondary | ICD-10-CM | POA: Diagnosis not present

## 2016-11-07 DIAGNOSIS — R590 Localized enlarged lymph nodes: Secondary | ICD-10-CM

## 2016-11-07 DIAGNOSIS — D721 Eosinophilia: Secondary | ICD-10-CM

## 2016-11-07 DIAGNOSIS — Z8673 Personal history of transient ischemic attack (TIA), and cerebral infarction without residual deficits: Secondary | ICD-10-CM

## 2016-11-07 DIAGNOSIS — Z801 Family history of malignant neoplasm of trachea, bronchus and lung: Secondary | ICD-10-CM

## 2016-11-07 DIAGNOSIS — J069 Acute upper respiratory infection, unspecified: Secondary | ICD-10-CM

## 2016-11-07 DIAGNOSIS — Z5112 Encounter for antineoplastic immunotherapy: Secondary | ICD-10-CM | POA: Diagnosis not present

## 2016-11-07 DIAGNOSIS — Z66 Do not resuscitate: Secondary | ICD-10-CM

## 2016-11-07 DIAGNOSIS — R51 Headache: Secondary | ICD-10-CM

## 2016-11-07 DIAGNOSIS — Z79899 Other long term (current) drug therapy: Secondary | ICD-10-CM

## 2016-11-07 DIAGNOSIS — D72829 Elevated white blood cell count, unspecified: Secondary | ICD-10-CM

## 2016-11-07 DIAGNOSIS — Z8701 Personal history of pneumonia (recurrent): Secondary | ICD-10-CM

## 2016-11-07 DIAGNOSIS — C7951 Secondary malignant neoplasm of bone: Secondary | ICD-10-CM

## 2016-11-07 DIAGNOSIS — G893 Neoplasm related pain (acute) (chronic): Secondary | ICD-10-CM

## 2016-11-07 DIAGNOSIS — R21 Rash and other nonspecific skin eruption: Secondary | ICD-10-CM

## 2016-11-07 DIAGNOSIS — M069 Rheumatoid arthritis, unspecified: Secondary | ICD-10-CM

## 2016-11-07 DIAGNOSIS — Z87448 Personal history of other diseases of urinary system: Secondary | ICD-10-CM

## 2016-11-07 DIAGNOSIS — Z808 Family history of malignant neoplasm of other organs or systems: Secondary | ICD-10-CM

## 2016-11-07 DIAGNOSIS — Z77090 Contact with and (suspected) exposure to asbestos: Secondary | ICD-10-CM

## 2016-11-07 DIAGNOSIS — Z87891 Personal history of nicotine dependence: Secondary | ICD-10-CM

## 2016-11-07 DIAGNOSIS — G63 Polyneuropathy in diseases classified elsewhere: Secondary | ICD-10-CM

## 2016-11-07 DIAGNOSIS — I998 Other disorder of circulatory system: Secondary | ICD-10-CM

## 2016-11-07 DIAGNOSIS — M129 Arthropathy, unspecified: Secondary | ICD-10-CM

## 2016-11-07 LAB — CBC WITH DIFFERENTIAL/PLATELET
Basophils Absolute: 0.1 10*3/uL (ref 0–0.1)
Basophils Relative: 1 %
Eosinophils Absolute: 0.4 10*3/uL (ref 0–0.7)
Eosinophils Relative: 7 %
HCT: 39.9 % — ABNORMAL LOW (ref 40.0–52.0)
Hemoglobin: 13.4 g/dL (ref 13.0–18.0)
Lymphocytes Relative: 23 %
Lymphs Abs: 1.5 10*3/uL (ref 1.0–3.6)
MCH: 31.5 pg (ref 26.0–34.0)
MCHC: 33.6 g/dL (ref 32.0–36.0)
MCV: 93.7 fL (ref 80.0–100.0)
Monocytes Absolute: 1.1 10*3/uL — ABNORMAL HIGH (ref 0.2–1.0)
Monocytes Relative: 17 %
Neutro Abs: 3.3 10*3/uL (ref 1.4–6.5)
Neutrophils Relative %: 52 %
Platelets: 195 10*3/uL (ref 150–440)
RBC: 4.25 MIL/uL — ABNORMAL LOW (ref 4.40–5.90)
RDW: 13.5 % (ref 11.5–14.5)
WBC: 6.4 10*3/uL (ref 3.8–10.6)

## 2016-11-07 LAB — COMPREHENSIVE METABOLIC PANEL
ALT: 61 U/L (ref 17–63)
AST: 77 U/L — ABNORMAL HIGH (ref 15–41)
Albumin: 3.8 g/dL (ref 3.5–5.0)
Alkaline Phosphatase: 61 U/L (ref 38–126)
Anion gap: 5 (ref 5–15)
BUN: 15 mg/dL (ref 6–20)
CO2: 30 mmol/L (ref 22–32)
Calcium: 9.4 mg/dL (ref 8.9–10.3)
Chloride: 100 mmol/L — ABNORMAL LOW (ref 101–111)
Creatinine, Ser: 0.73 mg/dL (ref 0.61–1.24)
GFR calc Af Amer: >60 mL/min (ref >60)
GFR calc non Af Amer: >60 mL/min (ref >60)
Glucose, Bld: 128 mg/dL — ABNORMAL HIGH (ref 65–99)
Potassium: 4.1 mmol/L (ref 3.5–5.1)
Sodium: 135 mmol/L (ref 135–145)
Total Bilirubin: 0.6 mg/dL (ref 0.3–1.2)
Total Protein: 8 g/dL (ref 6.5–8.1)

## 2016-11-07 LAB — MAGNESIUM: Magnesium: 1.8 mg/dL (ref 1.7–2.4)

## 2016-11-07 LAB — TSH: TSH: 2.05 u[IU]/mL (ref 0.350–4.500)

## 2016-11-07 MED ORDER — OXYCODONE-ACETAMINOPHEN 5-325 MG PO TABS: 1.0000 | ORAL_TABLET | Freq: Four times a day (QID) | ORAL | 0 refills | Status: DC | PRN

## 2016-11-07 MED ORDER — GABAPENTIN 300 MG PO CAPS: 300.0000 mg | ORAL_CAPSULE | Freq: Every day | ORAL | 2 refills | Status: DC

## 2016-11-07 NOTE — Progress Notes (Signed)
Patient c/o cough, congestion and sore throat.  States he had fever earlier today over 100 degrees.  Temp now 96.6 tympanic and 98.2 oral.  Patient requesting refill for oxycodone.

## 2016-11-07 NOTE — Telephone Encounter (Signed)
Faxed the [facility] to [facility] today

## 2016-11-07 NOTE — Progress Notes (Signed)
[facility] day:  11/07/16  Chief Complaint: Gerald [name] is a 65 y.o. male with metastatic lung cancer and associated hypereosinophilia who is seen for review of interval restaging scans and assessment prior to cycle #6 Keytruda.  HPI:  The patient was last seen by me in the medical oncology clinic on 10/12/2016.  At that time, he felt good.  He had a mild neuropathy in his left foot.  Weight was stable.  He received cycle #5 Keytruda.  Chest, abdomen, and pelvic CT scan on 10/19/2016 revealed radiation changes in the right lung.  The superimposed right lung pneumonia had improved.  The 6.4 x 2.4 cm right perihilar mass had decreased.  There was improving thoracic lymphadenopathy.  There was progression of hepatic metastases, measuring up to 4.7 cm.  Specifically, there was a 3.9 x 3.7 cm metastasis in segment 8 (previously 2.5 x 2.8 cm) and a 4.7 x 4.0 cm metastasis in segment 4B (previously 3.1 x 2.6 cm).  Clinically, he notes a scratchy throat and a stuffy nose.  He had a low-grade temperature.  Symptoms began on 11/03/2016.   Past Medical History:  Diagnosis Date  . Arthritis    Rheumatoid arthritis  . Cancer ([place])    lung ca   . Collagen vascular disease ([facility])    RA  . Mass of lung   . Pneumothorax    spontaneous  . Renal disorder    as a child    Past Surgical History:  Procedure Laterality Date  . FLEXIBLE BRONCHOSCOPY N/A 03/30/2016   Procedure: FLEXIBLE BRONCHOSCOPY;  Surgeon: Vilinda Boehringer, MD;  Location: [facility] ORS;  Service: Cardiopulmonary;  Laterality: N/A;  . Ureter repair as a child      Family History  Problem Relation Age of Onset  . Brain cancer Mother   . Lung cancer Maternal Uncle   . Cancer Sister     Metastatic cancer- unknown primary    Social History:  reports that he quit smoking about 16 years ago. His smoking use included Cigarettes. He has a 13.00 pack-year smoking history. He has never used smokeless  tobacco. He reports that he drinks alcohol. He reports that he does not use drugs.  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  He will be getting a refrigerator soon.  He lives in [place].  Contact number is [phone].  The patient is alone today.  Allergies:  Allergies  Allergen Reactions  . Nsaids Other (See Comments)    Pt is unable to take this class of medications.    . Sulfa Antibiotics Other (See Comments)    Reaction:  GI upset     Current Medications: Current Outpatient Prescriptions  Medication Sig Dispense Refill  . calcium-vitamin D (OSCAL WITH D) 500-200 MG-UNIT tablet Take 1 tablet by mouth 2 (two) times daily. 180 tablet 0  . gabapentin (NEURONTIN) 300 MG capsule Take 1 capsule (300 mg total) by mouth at bedtime. 30 capsule 2  . HYDROcodone-acetaminophen (NORCO) 7.5-325 MG tablet Take 1 tablet by mouth every 4 (four) hours as needed for moderate pain (Every 4-6 hours as needed for pain). 30 tablet 0  . oxyCODONE-acetaminophen (PERCOCET/ROXICET) 5-325 MG tablet Take 1 tablet by mouth every 6 (six) hours as needed for severe pain. 30 tablet 0  . ondansetron (ZOFRAN) 8 MG tablet Take 1 tablet (8 mg total) by mouth 2 (two) times  daily as needed for nausea or vomiting. (Patient not taking: Reported on 11/07/2016) 20 tablet 0  . sucralfate (CARAFATE) 1 g tablet Take 1 tablet (1 g total) by mouth 3 (three) times daily. Dissolve in 2-3 tbsp warm water, swish and swallow. (Patient not taking: Reported on 11/07/2016) 90 tablet 3   No current facility-administered medications for this visit.     Review of Systems:  GENERAL:  Feels "ok". No fever, chills or sweats.  Weight up 3 pounds. PERFORMANCE STATUS (ECOG):  2 HEENT: Runny nose.  Scratchy throat.  No visual changes, mouth sores or tenderness. Lungs:  No shortness of breath.  Cough.  No hemoptysis. Cardiac:  No chest pain, palpitations, orthopnea, or PND.  Sleeps in a  recliner. GI:  No nausea, vomiting, diarrhea, constipation, melena or hematochezia. GU:  No urgency, frequency, dysuria, or hematuria. Musculoskeletal:  Pain in right knee.  No back pain.  No muscle tenderness. Extremities:  No pain or swelling. Skin:  No rashes or skin changes. 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  No headache, focal weakness, balance or coordination issues. Endocrine:  No diabetes, thyroid issues, hot flashes or night sweats. Psych:  No mood changes, depression or anxiety. Pain:  No pain.  Review of systems:  All other systems reviewed and found to be negative.  Physical Exam: Blood pressure 105/67, pulse 94, temperature (!) 96.6 F (35.9 C), temperature source Tympanic, resp. rate 18, weight 132 lb 11.5 oz (60.2 kg). GENERAL:  Thin gentleman sitting comfortably in the exam room wearing a face mask in no acute distress. MENTAL STATUS:  Alert and oriented to person, place and time. HEAD:  Wearing a camo cap.  Long gray hair in pony tail.  Goatee.  Temporal wasting.  Normocephalic, atraumatic, face symmetric, no Cushingoid features. EYES:  Blue eyes.  Pupils equal round and reactive to light and accomodation.  No conjunctivitis or scleral icterus. ENT:  Oropharynx clear without lesion.  Tongue normal. Mucous membranes moist.  RESPIRATORY:  Clear to auscultation without rales, wheezes or rhonchi. CARDIOVASCULAR:  Regular rate and rhythm without murmur, rub or gallop. ABDOMEN:  Soft, non-tender, with active bowel sounds, and no hepatosplenomegaly.  No masses. SKIN:  No rashes, ulcers or lesions. EXTREMITIES: No edema, no skin discoloration or tenderness.  No palpable cords. LYMPH NODES: No palpable cervical, supraclavicular, axillary or inguinal adenopathy  NEUROLOGICAL: Unremarkable. PSYCH:  Appropriate.    Appointment on 11/07/2016  Component Date Value Ref Range Status  . WBC 11/07/2016 6.4  3.8 - 10.6 K/uL Final  .  RBC 11/07/2016 4.25* 4.40 - 5.90 MIL/uL Final  . Hemoglobin 11/07/2016 13.4  13.0 - 18.0 g/dL Final  . HCT 11/07/2016 39.9* 40.0 - 52.0 % Final  . MCV 11/07/2016 93.7  80.0 - 100.0 fL Final  . MCH 11/07/2016 31.5  26.0 - 34.0 pg Final  . MCHC 11/07/2016 33.6  32.0 - 36.0 g/dL Final  . RDW 11/07/2016 13.5  11.5 - 14.5 % Final  . Platelets 11/07/2016 195  150 - 440 K/uL Final  . Neutrophils Relative % 11/07/2016 52  % Final  . Neutro Abs 11/07/2016 3.3  1.4 - 6.5 K/uL Final  . Lymphocytes Relative 11/07/2016 23  % Final  . Lymphs Abs 11/07/2016 1.5  1.0 - 3.6 K/uL Final  . Monocytes Relative 11/07/2016 17  % Final  . Monocytes Absolute 11/07/2016 1.1* 0.2 - 1.0 K/uL Final  . Eosinophils Relative 11/07/2016  7  % Final  . Eosinophils Absolute 11/07/2016 0.4  0 - 0.7 K/uL Final  . Basophils Relative 11/07/2016 1  % Final  . Basophils Absolute 11/07/2016 0.1  0 - 0.1 K/uL Final  . Sodium 11/07/2016 135  135 - 145 mmol/L Final  . Potassium 11/07/2016 4.1  3.5 - 5.1 mmol/L Final  . Chloride 11/07/2016 100* 101 - 111 mmol/L Final  . CO2 11/07/2016 30  22 - 32 mmol/L Final  . Glucose, Bld 11/07/2016 128* 65 - 99 mg/dL Final  . BUN 11/07/2016 15  6 - 20 mg/dL Final  . Creatinine, Ser 11/07/2016 0.73  0.61 - 1.24 mg/dL Final  . Calcium 11/07/2016 9.4  8.9 - 10.3 mg/dL Final  . Total Protein 11/07/2016 8.0  6.5 - 8.1 g/dL Final  . Albumin 11/07/2016 3.8  3.5 - 5.0 g/dL Final  . AST 11/07/2016 77* 15 - 41 U/L Final  . ALT 11/07/2016 61  17 - 63 U/L Final  . Alkaline Phosphatase 11/07/2016 61  38 - 126 U/L Final  . Total Bilirubin 11/07/2016 0.6  0.3 - 1.2 mg/dL Final  . GFR calc non Af Amer 11/07/2016 >60  >60 mL/min Final  . GFR calc Af Amer 11/07/2016 >60  >60 mL/min Final   Comment: (NOTE) The eGFR has been calculated using the CKD EPI equation. This calculation has not been validated in all clinical situations. eGFR's persistently <60 mL/min signify possible Chronic Kidney Disease.   .  Anion gap 11/07/2016 5  5 - 15 Final  . Magnesium 11/07/2016 1.8  1.7 - 2.4 mg/dL Final    Assessment:  Gerald [name] is a 65 y.o. male with stage IV non-small cell lung cancer and associated leukocytosis with eosinophilia. He presented with a 30 pound weight loss, shortness of breath, cough, and left leg discomfort. He has a 13-15 pack year smoking history.  Chest CT angiogram on 03/27/2016 revealed an 8 cm mass in the medial right upper lobe. The mass abutted the mediastinum and possibly invaded the left atrium. There was associated widespread thoracic adenopathy. There was a 2.6 cm enhancing lesion in the medial segment of the left hepatic lobe.  Bronchoscopy on 03/30/2016 revealed an endobronchial lesion in the RUL anterior segment with 95% occlusion. There was extrinsic compression in the right middle lobe secondary to posterior mass effect. Pathology confirmed non-small cell lung cancer, favor adenocarcinoma.  PD-L1 testing revealed high expression (> 50%).  EGFR, ALK, and ROS1 were negative.  CEA was 2.3 on 03/27/2016.  PET scan on 04/05/2016 revealed intense hypermetabolism (SUV 17.95) associated with a 7.5 cm central perihilar right lung lesion. There was mediastinal (sub-carinal node SUV 8.89; 2.6 cm right paratracheal node SUV 17.49) and upper abdominal retroperitoneal hypermetabolic adenopathy (9 mm aortocaval node SUV 9.2). There were 2 liver metastasis (2.3 cm left lobe SUV 6.19; 2.2 cm segment VIII SUV 5.18). There are multifocal bone metastasis (C2 vertebral body SUV 9.0; 1.6 cm left sacral ala lesion SUV of 8.6).   Head MRI on 03/31/2016 was indeterminate for early metastatic disease to the brain. There was a small focus of gyral enhancement in the anterior right frontal lobe likely is post ischemic, while a small posterior right cerebellar enhancing lesion was more suspicious for small brain metastasis. There were multiple small primarily subacute appearing infarcts in the  bilateral MCA and left PICA territories.   Head MRI on 05/12/2016 revealed expected interval evolution of bilateral cerebral in cerebellar infarcts. There was resolution of  right frontal and right cerebellar enhancement consistent with subacute infarcts. There was a new 3 mm focus of gyral enhancement in the left occipital lobe and a possible new 4 mm enhancing lesion in the left cerebellum (? vascular enhancement versus tiny metastasis).   Head MRI on 08/16/2016 revealed multiple areas of improving enhancement and restricted diffusion compatible with resolving subacute infarcts.  There was no evidence of metastatic disease or acute infarction   Bone scan on 04/07/2016 revealed a subtle distal right femur lesion which might be a small metastasis.  Bone scan is felt to be an unreliable modality for evaluation of osseous metastatic disease as the bone metastases seen on the recent PET and MRI were not evident.  He receives [place] monthly (last 09/20/2016).  He had marked leukocytosis with hypereosinophilia. Initial WBC was 72,300 with 53% eosinophils. Bone marrow on 03/29/2016 revealed marked increase in marrow eosinophils (approximate 30%). There was no diagnostic morphologic evidence of a myeloproliferative or lymphoid neoplasm. Flow cytometry revealed significant increase in eosinophils (54%). There was relative decreased myeloid cells with no significant immunophenotypic abnormalities or increase in blasts. There was no lymphoid abnormalities or evidence of clonality. FISH studies were negative for myeloproliferative neoplasms with eosinophilia (PDGFRA, PDGFRB, FGFR1). BCR-ABL was negative on 03/28/2016.  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  Left lower extremity duplex on 03/25/2016 revealed no evidence of DVT. Discomfort improved on Neurontin 300 mg a day and continues to improve with declining eosinophilia.  He received 41.4 Gy  from 04/11/2016 - 05/23/2016.  He received 4 weeks of concurrent carboplatin and Taxol (04/17/2016 - 05/22/2016).    He has received 5 cycles of Keytruda (07/03/2016 - 10/12/2016).  He has tolerated treatment well.  He was diagnosed with aspiration pneumonia.  Chest CT on 06/16/2016 revealed multifocal airspace opacities within the right lung which had progressed since the prior CT, suspicious for postobstructive and/or aspiration pneumonia. There was a cavitary component in the right upper lobe posteriorly.  The dominant right upper lobe mass and the hilar adenopathy had improved.  He received 10 days of Levaquin.  He completed a course of Augmentin.  Chest, abdomen, and pelvic CT scan on 10/19/2016 revealed radiation changes in the right lung.  The superimposed right lung pneumonia had improved.  The 6.4 x 2.4 cm right perihilar mass had decreased.  There was improving thoracic lymphadenopathy.  There was progression of hepatic metastases, measuring up to 4.7 cm.  Specifically, there was a 3.9 x 3.7 cm metastasis in segment 8 (previously 2.5 x 2.8 cm) and a 4.7 x 4.0 cm metastasis in segment 4B (previously 3.1 x 2.6 cm).  Symptomatically, he has an upper respiratory tract infection.  Code status is DNR/DNI.  Plan: 1.  Labs today:  CBC with diff, CMP, Mg, TSH. 2.  Review scans.  Discussed scans suggesting a mixed response.  Discuss concern for progressive liver lesions.  Films reviewed with radiology.  Discuss plan for PET scan to confirm/refute progression in liver. 3.  Foundation One testing. 4.  Postpone Keytruda today. 5.  PET scan: restaging. 6.  Refill gabapentin and Percocet 5/325. 7.  RTC after PET scan for decision regarding direction of therapy.   Lequita Asal, MD  11/07/2016, 2:06 PM

## 2016-11-07 NOTE — Telephone Encounter (Addendum)
Called to report that he has a fever 100 and cold, scratchy throat, stuffy nose. Has an appt at 115 for lab, md, chemo. Please advise as he does not want to infect others.

## 2016-11-07 NOTE — Telephone Encounter (Signed)
Keep lab md appt will cancel chemo per Dr Mike Gip. Patient advised and he will put on a mask when he gets here

## 2016-11-09 ENCOUNTER — Ambulatory Visit: Payer: Medicare Other

## 2016-11-10 ENCOUNTER — Inpatient Hospital Stay: Payer: Medicare Other | Admitting: Hematology and Oncology

## 2016-11-10 NOTE — Progress Notes (Deleted)
[facility] day:  11/10/16  Chief Complaint: Kreed Kauffman is a 65 y.o. male with metastatic lung cancer and associated hypereosinophilia who is seen for review of interval PET scan and discussion regarding direction of therapy.  HPI:  The patient was last seen by me in the medical oncology clinic on 11/07/2016.  At that time, CT scans revealed improving disease in the chest and apparent progressive liver metastasis.  Liver metastasis appeared necrotic.  Decision was made to pursue PET scan prior to a potential change in therapy.   Past Medical History:  Diagnosis Date  . Arthritis    Rheumatoid arthritis  . Cancer ([place])    lung ca   . Collagen vascular disease ([facility])    RA  . Mass of lung   . Pneumothorax    spontaneous  . Renal disorder    as a child    Past Surgical History:  Procedure Laterality Date  . FLEXIBLE BRONCHOSCOPY N/A 03/30/2016   Procedure: FLEXIBLE BRONCHOSCOPY;  Surgeon: Vilinda Boehringer, MD;  Location: [facility] ORS;  Service: Cardiopulmonary;  Laterality: N/A;  . Ureter repair as a child      Family History  Problem Relation Age of Onset  . Brain cancer Mother   . Lung cancer Maternal Uncle   . Cancer Sister     Metastatic cancer- unknown primary    Social History:  reports that he quit smoking about 16 years ago. His smoking use included Cigarettes. He has a 13.00 pack-year smoking history. He has never used smokeless tobacco. He reports that he drinks alcohol. He reports that he does not use drugs.  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  He will be getting a refrigerator soon.  He lives in [place].  Contact number is [phone].  The patient is accompanied by his daughter today.  Allergies:  Allergies  Allergen Reactions  . Nsaids Other (See Comments)    Pt is unable to take this class of medications.    . Sulfa Antibiotics Other (See Comments)   Reaction:  GI upset     Current Medications: Current Outpatient Prescriptions  Medication Sig Dispense Refill  . calcium-vitamin D (OSCAL WITH D) 500-200 MG-UNIT tablet Take 1 tablet by mouth 2 (two) times daily. 180 tablet 0  . gabapentin (NEURONTIN) 300 MG capsule Take 1 capsule (300 mg total) by mouth at bedtime. 30 capsule 2  . HYDROcodone-acetaminophen (NORCO) 7.5-325 MG tablet Take 1 tablet by mouth every 4 (four) hours as needed for moderate pain (Every 4-6 hours as needed for pain). 30 tablet 0  . ondansetron (ZOFRAN) 8 MG tablet Take 1 tablet (8 mg total) by mouth 2 (two) times daily as needed for nausea or vomiting. (Patient not taking: Reported on 11/07/2016) 20 tablet 0  . oxyCODONE-acetaminophen (PERCOCET/ROXICET) 5-325 MG tablet Take 1 tablet by mouth every 6 (six) hours as needed for severe pain. 30 tablet 0  . sucralfate (CARAFATE) 1 g tablet Take 1 tablet (1 g total) by mouth 3 (three) times daily. Dissolve in 2-3 tbsp warm water, swish and swallow. (Patient not taking: Reported on 11/07/2016) 90 tablet 3   No current facility-administered medications for this visit.     Review of Systems:  GENERAL:  Feels "good". No fever, chills or sweats.  Weight down 4 pounds. PERFORMANCE STATUS (ECOG):  2 HEENT: No visual changes, sore throat, mouth sores or  tenderness. Lungs:  No shortness of breath.  Dry cough.  No hemoptysis. Cardiac:  No chest pain, palpitations, orthopnea, or PND.  Sleeps in a recliner. GI:  Appetite good.  No nausea, vomiting, diarrhea, constipation, melena or hematochezia. GU:  No urgency, frequency, dysuria, or hematuria. Musculoskeletal:  Pain in right knee.  No back pain.  No muscle tenderness. Extremities:  No pain or swelling. Skin:  No rashes or skin changes. 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  No headache, focal weakness, balance or coordination issues. Endocrine:  No diabetes, thyroid issues, hot  flashes or night sweats. Psych:  No mood changes, depression or anxiety. Pain:  No pain.  Review of systems:  All other systems reviewed and found to be negative.  Physical Exam: There were no vitals taken for this visit. GENERAL:  Thin gentleman sitting comfortably in the exam room in no acute distress. MENTAL STATUS:  Alert and oriented to person, place and time. HEAD:  Wearing a camo cap.  Long gray hair in pony tail.  Goatee.  Temporal wasting.  Normocephalic, atraumatic, face symmetric, no Cushingoid features. EYES:  Blue eyes.  Pupils equal round and reactive to light and accomodation.  No conjunctivitis or scleral icterus. ENT:  Oropharynx clear without lesion.  Tongue normal. Mucous membranes moist.  RESPIRATORY:  Clear to auscultation without rales, wheezes or rhonchi. CARDIOVASCULAR:  Regular rate and rhythm without murmur, rub or gallop. ABDOMEN:  Soft, non-tender, with active bowel sounds, and no hepatosplenomegaly.  No masses. SKIN:  Razor irritation on face.  No rashes, ulcers or lesions. EXTREMITIES: No edema, no skin discoloration or tenderness.  No palpable cords. LYMPH NODES: No palpable cervical, supraclavicular, axillary or inguinal adenopathy  NEUROLOGICAL: Unremarkable. PSYCH:  Appropriate.    No visits with results within 3 Day(s) from this visit.  Latest known visit with results is:  Appointment on 11/07/2016  Component Date Value Ref Range Status  . WBC 11/07/2016 6.4  3.8 - 10.6 K/uL Final  . RBC 11/07/2016 4.25* 4.40 - 5.90 MIL/uL Final  . Hemoglobin 11/07/2016 13.4  13.0 - 18.0 g/dL Final  . HCT 11/07/2016 39.9* 40.0 - 52.0 % Final  . MCV 11/07/2016 93.7  80.0 - 100.0 fL Final  . MCH 11/07/2016 31.5  26.0 - 34.0 pg Final  . MCHC 11/07/2016 33.6  32.0 - 36.0 g/dL Final  . RDW 11/07/2016 13.5  11.5 - 14.5 % Final  . Platelets 11/07/2016 195  150 - 440 K/uL Final  . Neutrophils Relative % 11/07/2016 52  % Final  . Neutro Abs 11/07/2016 3.3  1.4 - 6.5 K/uL  Final  . Lymphocytes Relative 11/07/2016 23  % Final  . Lymphs Abs 11/07/2016 1.5  1.0 - 3.6 K/uL Final  . Monocytes Relative 11/07/2016 17  % Final  . Monocytes Absolute 11/07/2016 1.1* 0.2 - 1.0 K/uL Final  . Eosinophils Relative 11/07/2016 7  % Final  . Eosinophils Absolute 11/07/2016 0.4  0 - 0.7 K/uL Final  . Basophils Relative 11/07/2016 1  % Final  . Basophils Absolute 11/07/2016 0.1  0 - 0.1 K/uL Final  . Sodium 11/07/2016 135  135 - 145 mmol/L Final  . Potassium 11/07/2016 4.1  3.5 - 5.1 mmol/L Final  . Chloride 11/07/2016 100* 101 - 111 mmol/L Final  . CO2 11/07/2016 30  22 - 32 mmol/L Final  . Glucose, Bld 11/07/2016 128* 65 - 99 mg/dL Final  . BUN 11/07/2016  15  6 - 20 mg/dL Final  . Creatinine, Ser 11/07/2016 0.73  0.61 - 1.24 mg/dL Final  . Calcium 11/07/2016 9.4  8.9 - 10.3 mg/dL Final  . Total Protein 11/07/2016 8.0  6.5 - 8.1 g/dL Final  . Albumin 11/07/2016 3.8  3.5 - 5.0 g/dL Final  . AST 11/07/2016 77* 15 - 41 U/L Final  . ALT 11/07/2016 61  17 - 63 U/L Final  . Alkaline Phosphatase 11/07/2016 61  38 - 126 U/L Final  . Total Bilirubin 11/07/2016 0.6  0.3 - 1.2 mg/dL Final  . GFR calc non Af Amer 11/07/2016 >60  >60 mL/min Final  . GFR calc Af Amer 11/07/2016 >60  >60 mL/min Final   Comment: (NOTE) The eGFR has been calculated using the CKD EPI equation. This calculation has not been validated in all clinical situations. eGFR's persistently <60 mL/min signify possible Chronic Kidney Disease.   . Anion gap 11/07/2016 5  5 - 15 Final  . Magnesium 11/07/2016 1.8  1.7 - 2.4 mg/dL Final  . TSH 11/07/2016 2.050  0.350 - 4.500 uIU/mL Final    Assessment:  Paxon Propes is a 65 y.o. male with stage IV non-small cell lung cancer and associated leukocytosis with eosinophilia. He presented with a 30 pound weight loss, shortness of breath, cough, and left leg discomfort. He has a 13-15 pack year smoking history.  Chest CT angiogram on 03/27/2016 revealed an 8 cm mass  in the medial right upper lobe. The mass abutted the mediastinum and possibly invaded the left atrium. There was associated widespread thoracic adenopathy. There was a 2.6 cm enhancing lesion in the medial segment of the left hepatic lobe.  Bronchoscopy on 03/30/2016 revealed an endobronchial lesion in the RUL anterior segment with 95% occlusion. There was extrinsic compression in the right middle lobe secondary to posterior mass effect. Pathology confirmed non-small cell lung cancer, favor adenocarcinoma.  PD-L1 testing revealed high expression (> 50%).  EGFR, ALK, and ROS1 were negative.  CEA was 2.3 on 03/27/2016.  PET scan on 04/05/2016 revealed intense hypermetabolism (SUV 17.95) associated with a 7.5 cm central perihilar right lung lesion. There was mediastinal (sub-carinal node SUV 8.89; 2.6 cm right paratracheal node SUV 17.49) and upper abdominal retroperitoneal hypermetabolic adenopathy (9 mm aortocaval node SUV 9.2). There were 2 liver metastasis (2.3 cm left lobe SUV 6.19; 2.2 cm segment VIII SUV 5.18). There are multifocal bone metastasis (C2 vertebral body SUV 9.0; 1.6 cm left sacral ala lesion SUV of 8.6).   Head MRI on 03/31/2016 was indeterminate for early metastatic disease to the brain. There was a small focus of gyral enhancement in the anterior right frontal lobe likely is post ischemic, while a small posterior right cerebellar enhancing lesion was more suspicious for small brain metastasis. There were multiple small primarily subacute appearing infarcts in the bilateral MCA and left PICA territories.   Head MRI on 05/12/2016 revealed expected interval evolution of bilateral cerebral in cerebellar infarcts. There was resolution of right frontal and right cerebellar enhancement consistent with subacute infarcts. There was a new 3 mm focus of gyral enhancement in the left occipital lobe and a possible new 4 mm enhancing lesion in the left cerebellum (? vascular enhancement versus  tiny metastasis).   Head MRI on 08/16/2016 revealed multiple areas of improving enhancement and restricted diffusion compatible with resolving subacute infarcts.  There was no evidence of metastatic disease or acute infarction   Bone scan on 04/07/2016 revealed a subtle distal  right femur lesion which might be a small metastasis.  Bone scan is felt to be an unreliable modality for evaluation of osseous metastatic disease as the bone metastases seen on the recent PET and MRI were not evident.  He receives [place] monthly (last 09/20/2016).  He had marked leukocytosis with hypereosinophilia. Initial WBC was 72,300 with 53% eosinophils. Bone marrow on 03/29/2016 revealed marked increase in marrow eosinophils (approximate 30%). There was no diagnostic morphologic evidence of a myeloproliferative or lymphoid neoplasm. Flow cytometry revealed significant increase in eosinophils (54%). There was relative decreased myeloid cells with no significant immunophenotypic abnormalities or increase in blasts. There was no lymphoid abnormalities or evidence of clonality. FISH studies were negative for myeloproliferative neoplasms with eosinophilia (PDGFRA, PDGFRB, FGFR1). BCR-ABL was negative on 03/28/2016.  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  Left lower extremity duplex on 03/25/2016 revealed no evidence of DVT. Discomfort improved on Neurontin 300 mg a day and continues to improve with declining eosinophilia.  He received 41.4 Gy from 04/11/2016 - 05/23/2016.  He received 4 weeks of concurrent carboplatin and Taxol (04/17/2016 - 05/22/2016).  He has received 5 cycles of Keytruda (07/03/2016 - 10/12/2016).  He was diagnosed with aspiration pneumonia.  Chest CT on 06/16/2016 revealed multifocal airspace opacities within the right lung which had progressed since the prior CT, suspicious for postobstructive and/or aspiration pneumonia. There was a  cavitary component in the right upper lobe posteriorly.  The dominant right upper lobe mass and the hilar adenopathy had improved.  He received 10 days of Levaquin.  He completed a course of Augmentin.  Symptomatically, he feels good.  He has a mild neuropathy in his left foot.  Weight is stable.  Code status is DNR/DNI.  Plan: 1.  Labs today:  CBC with diff, CMP, Mg, TSH.  2.  Cycle #5 Keytruda. 3.  Discuss plans for [facility] every other treatment. 4.  Discuss missed restaging studies. 5.  Reschedule CT scans prior to next cycle. 6.  RTC in 3 weeks for for MD assessment, labs (CBC with diff, CMP, Mg, TSH), Xgeva, and cycle #6 Keytruda.   Lequita Asal, MD  11/10/2016, 3:56 AM

## 2016-11-14 ENCOUNTER — Ambulatory Visit
Admission: RE | Admit: 2016-11-14 | Discharge: 2016-11-14 | Disposition: A | Discharge status: Home / Self Care | Payer: Medicare Other | Source: Ambulatory Visit | Attending: Hematology and Oncology | Admitting: Hematology and Oncology

## 2016-11-14 DIAGNOSIS — R160 Hepatomegaly, not elsewhere classified: Secondary | ICD-10-CM | POA: Insufficient documentation

## 2016-11-14 DIAGNOSIS — C7951 Secondary malignant neoplasm of bone: Secondary | ICD-10-CM | POA: Diagnosis not present

## 2016-11-14 DIAGNOSIS — J0100 Acute maxillary sinusitis, unspecified: Secondary | ICD-10-CM | POA: Insufficient documentation

## 2016-11-14 DIAGNOSIS — C3411 Malignant neoplasm of upper lobe, right bronchus or lung: Secondary | ICD-10-CM | POA: Insufficient documentation

## 2016-11-14 LAB — GLUCOSE, CAPILLARY: Glucose-Capillary: 82 mg/dL (ref 65–99)

## 2016-11-14 MED ORDER — FLUDEOXYGLUCOSE F - 18 (FDG) INJECTION
13.1500 | Freq: Once | INTRAVENOUS | Status: AC | PRN
  Administered 2016-11-14: 13.15 via INTRAVENOUS

## 2016-11-15 ENCOUNTER — Other Ambulatory Visit: Payer: Self-pay | Admitting: Hematology and Oncology

## 2016-11-16 ENCOUNTER — Inpatient Hospital Stay: Payer: Medicare Other

## 2016-11-16 ENCOUNTER — Inpatient Hospital Stay: Payer: Medicare Other | Attending: Hematology and Oncology | Admitting: Hematology and Oncology

## 2016-11-16 ENCOUNTER — Other Ambulatory Visit: Payer: Self-pay | Admitting: Hematology and Oncology

## 2016-11-16 VITALS — BP 124/73 | HR 85 | Temp 97.3°F | Resp 18 | Wt 133.4 lb

## 2016-11-16 DIAGNOSIS — C787 Secondary malignant neoplasm of liver and intrahepatic bile duct: Secondary | ICD-10-CM | POA: Diagnosis not present

## 2016-11-16 DIAGNOSIS — G939 Disorder of brain, unspecified: Secondary | ICD-10-CM | POA: Diagnosis not present

## 2016-11-16 DIAGNOSIS — D721 Eosinophilia: Secondary | ICD-10-CM | POA: Diagnosis not present

## 2016-11-16 DIAGNOSIS — Z79899 Other long term (current) drug therapy: Secondary | ICD-10-CM | POA: Diagnosis not present

## 2016-11-16 DIAGNOSIS — Z8701 Personal history of pneumonia (recurrent): Secondary | ICD-10-CM | POA: Insufficient documentation

## 2016-11-16 DIAGNOSIS — E538 Deficiency of other specified B group vitamins: Secondary | ICD-10-CM | POA: Diagnosis not present

## 2016-11-16 DIAGNOSIS — C3411 Malignant neoplasm of upper lobe, right bronchus or lung: Secondary | ICD-10-CM | POA: Diagnosis not present

## 2016-11-16 DIAGNOSIS — M069 Rheumatoid arthritis, unspecified: Secondary | ICD-10-CM | POA: Diagnosis not present

## 2016-11-16 DIAGNOSIS — J069 Acute upper respiratory infection, unspecified: Secondary | ICD-10-CM | POA: Diagnosis not present

## 2016-11-16 DIAGNOSIS — I998 Other disorder of circulatory system: Secondary | ICD-10-CM

## 2016-11-16 DIAGNOSIS — C7951 Secondary malignant neoplasm of bone: Secondary | ICD-10-CM

## 2016-11-16 DIAGNOSIS — G629 Polyneuropathy, unspecified: Secondary | ICD-10-CM | POA: Insufficient documentation

## 2016-11-16 DIAGNOSIS — Z87891 Personal history of nicotine dependence: Secondary | ICD-10-CM | POA: Insufficient documentation

## 2016-11-16 DIAGNOSIS — Z66 Do not resuscitate: Secondary | ICD-10-CM | POA: Diagnosis not present

## 2016-11-16 DIAGNOSIS — Z5112 Encounter for antineoplastic immunotherapy: Secondary | ICD-10-CM | POA: Insufficient documentation

## 2016-11-16 MED ORDER — CYANOCOBALAMIN 1000 MCG/ML IJ SOLN
1000.0000 ug | Freq: Once | INTRAMUSCULAR | Status: AC
  Administered 2016-11-16: 1000 ug via INTRAMUSCULAR
  Filled 2016-11-16: qty 1

## 2016-11-16 MED ORDER — FOLIC ACID 1 MG PO TABS: 1.0000 mg | ORAL_TABLET | Freq: Every day | ORAL | 3 refills | Status: DC

## 2016-11-16 NOTE — Progress Notes (Signed)
Patient on plan of care prior to pathways. 

## 2016-11-16 NOTE — Progress Notes (Signed)
[name] day:  11/16/2016  Chief Complaint: Gerald [name] is a 65 y.o. male with metastatic lung cancer and associated hypereosinophilia who is seen for review of interval PET scan and discussion regarding direction of therapy.  HPI:  The patient was last seen by me in the medical oncology clinic on 11/07/2016.  At that time, CT scans revealed improving disease in the chest and apparent progressive liver metastasis.  Liver metastasis appeared necrotic.  Decision was made to pursue PET scan prior to a potential change in therapy.  PET scan on 11/14/2016 revealed two liver masses. Both were significantly more hypermetabolic than on 61/68/3729 and have enlarged compared to the prior PET-CT.  The segment 8 lesion was centrally necrotic but with high activity along its periphery (7.4), and about the same size as it was on 10/19/2016. The lateral segment left hepatic lobe lesion had enlarged (5.9 x 5.3 cm) compared to 10/19/16 (4.1 x 4.4 cm).  The bony metastatic lesions were no longer hypermetabolic and have resolved.  There were post therapy and postoperative findings in the right lung, with a considerable amount of suspected radiation pneumonitis and radiation fibrosis, but no compelling evidence of residual metastatic disease in the lung or in the mediastinum.  Symptomatically, he notes a little congestion.  He denies any fever.   Past Medical History:  Diagnosis Date  . Arthritis    Rheumatoid arthritis  . Cancer ([place])    lung ca   . Collagen vascular disease ([facility])    RA  . Mass of lung   . Pneumothorax    spontaneous  . Renal disorder    as a child    Past Surgical History:  Procedure Laterality Date  . FLEXIBLE BRONCHOSCOPY N/A 03/30/2016   Procedure: FLEXIBLE BRONCHOSCOPY;  Surgeon: Vilinda Boehringer, MD;  Location: [facility] ORS;  Service: Cardiopulmonary;  Laterality: N/A;  . Ureter repair as a child      Family History  Problem Relation  Age of Onset  . Brain cancer Mother   . Lung cancer Maternal Uncle   . Cancer Sister     Metastatic cancer- unknown primary    Social History:  reports that he quit smoking about 16 years ago. His smoking use included Cigarettes. He has a 13.00 pack-year smoking history. He has never used smokeless tobacco. He reports that he drinks alcohol. He reports that he does not use drugs.  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  He will be getting a refrigerator soon.  He lives in [place].  Contact number is [phone].  The patient is alone today.  Allergies:  Allergies  Allergen Reactions  . Nsaids Other (See Comments)    Pt is unable to take this class of medications.    . Sulfa Antibiotics Other (See Comments)    Reaction:  GI upset     Current Medications: Current Outpatient Prescriptions  Medication Sig Dispense Refill  . calcium-vitamin D (OSCAL WITH D) 500-200 MG-UNIT tablet Take 1 tablet by mouth 2 (two) times daily. 180 tablet 0  . gabapentin (NEURONTIN) 300 MG capsule Take 1 capsule (300 mg total) by mouth at bedtime. 30 capsule 2  . HYDROcodone-acetaminophen (NORCO) 7.5-325 MG tablet Take 1 tablet by mouth every 4 (four) hours as needed for moderate pain (Every 4-6 hours as needed for pain). 30 tablet 0  . oxyCODONE-acetaminophen (PERCOCET/ROXICET) 5-325 MG tablet Take  1 tablet by mouth every 6 (six) hours as needed for severe pain. 30 tablet 0  . folic acid (FOLVITE) 1 MG tablet Take 1 tablet (1 mg total) by mouth daily. 30 tablet 3  . ondansetron (ZOFRAN) 8 MG tablet Take 1 tablet (8 mg total) by mouth 2 (two) times daily as needed for nausea or vomiting. (Patient not taking: Reported on 11/16/2016) 20 tablet 0  . sucralfate (CARAFATE) 1 g tablet Take 1 tablet (1 g total) by mouth 3 (three) times daily. Dissolve in 2-3 tbsp warm water, swish and swallow. (Patient not taking: Reported on 11/16/2016) 90 tablet 3   No current  facility-administered medications for this visit.     Review of Systems:  GENERAL:  Feels "a little better". No fever, chills or sweats.  Weight up 1 pound. PERFORMANCE STATUS (ECOG):  2 HEENT: No visual changes, sore throat, mouth sores or tenderness. Lungs:  Little congestion and cough.  No shortness of breath. No hemoptysis. Cardiac:  No chest pain, palpitations, orthopnea, or PND.  Sleeps in a recliner. GI:  Appetite good.  No nausea, vomiting, diarrhea, constipation, melena or hematochezia. GU:  No urgency, frequency, dysuria, or hematuria. Musculoskeletal:  Pain in right knee.  No back pain.  No muscle tenderness. Extremities:  No pain or swelling. Skin:  No rashes or skin changes. 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  No headache, focal weakness, balance or coordination issues. Endocrine:  No diabetes, thyroid issues, hot flashes or night sweats. Psych:  No mood changes, depression or anxiety. Pain:  No pain.  Review of systems:  All other systems reviewed and found to be negative.  Physical Exam: Blood pressure 124/73, pulse 85, temperature 97.3 F (36.3 C), temperature source Tympanic, resp. rate 18, weight 133 lb 6.1 oz (60.5 kg). GENERAL:  Thin gentleman sitting comfortably in the exam room in no acute distress. MENTAL STATUS:  Alert and oriented to person, place and time. HEAD:  Wearing a camo cap.  Long gray hair in pony tail.  Goatee.  Temporal wasting.  Normocephalic, atraumatic, face symmetric, no Cushingoid features. EYES:  Blue eyes.  No conjunctivitis or scleral icterus. RESPIRATORY:  Clear to auscultation without rales, wheezes or rhonchi. CARDIOVASCULAR:  Regular rate and rhythm without murmur, rub or gallop. NEUROLOGICAL: Unremarkable. PSYCH:  Appropriate.    Hospital Outpatient Visit on 11/14/2016  Component Date Value Ref Range Status  . Glucose-Capillary 11/14/2016 82  65 - 99 mg/dL Final    Assessment:   Gerald [name] is a 65 y.o. male with stage IV adenocarcinoma of the lung and associated leukocytosis with eosinophilia. He presented with a 30 pound weight loss, shortness of breath, cough, and left leg discomfort. He has a 13-15 pack year smoking history.  Chest CT angiogram on 03/27/2016 revealed an 8 cm mass in the medial right upper lobe. The mass abutted the mediastinum and possibly invaded the left atrium. There was associated widespread thoracic adenopathy. There was a 2.6 cm enhancing lesion in the medial segment of the left hepatic lobe.  Bronchoscopy on 03/30/2016 revealed an endobronchial lesion in the RUL anterior segment with 95% occlusion. There was extrinsic compression in the right middle lobe secondary to posterior mass effect. Pathology confirmed non-small cell lung cancer, favor adenocarcinoma.  PD-L1 testing revealed high expression (> 50%).  EGFR, ALK, and ROS1 were negative.  CEA was 2.3 on 03/27/2016.  PET scan on 04/05/2016 revealed intense hypermetabolism (SUV  17.95) associated with a 7.5 cm central perihilar right lung lesion. There was mediastinal (sub-carinal node SUV 8.89; 2.6 cm right paratracheal node SUV 17.49) and upper abdominal retroperitoneal hypermetabolic adenopathy (9 mm aortocaval node SUV 9.2). There were 2 liver metastasis (2.3 cm left lobe SUV 6.19; 2.2 cm segment VIII SUV 5.18). There are multifocal bone metastasis (C2 vertebral body SUV 9.0; 1.6 cm left sacral ala lesion SUV of 8.6).   Head MRI on 03/31/2016 was indeterminate for early metastatic disease to the brain. There was a small focus of gyral enhancement in the anterior right frontal lobe likely is post ischemic, while a small posterior right cerebellar enhancing lesion was more suspicious for small brain metastasis. There were multiple small primarily subacute appearing infarcts in the bilateral MCA and left PICA territories.   Head MRI on 05/12/2016 revealed expected interval evolution of  bilateral cerebral in cerebellar infarcts. There was resolution of right frontal and right cerebellar enhancement consistent with subacute infarcts. There was a new 3 mm focus of gyral enhancement in the left occipital lobe and a possible new 4 mm enhancing lesion in the left cerebellum (? vascular enhancement versus tiny metastasis).   Head MRI on 08/16/2016 revealed multiple areas of improving enhancement and restricted diffusion compatible with resolving subacute infarcts.  There was no evidence of metastatic disease or acute infarction   Bone scan on 04/07/2016 revealed a subtle distal right femur lesion which might be a small metastasis.  Bone scan is felt to be an unreliable modality for evaluation of osseous metastatic disease as the bone metastases seen on the recent PET and MRI were not evident.  He receives [place] monthly (last 09/20/2016).  He had marked leukocytosis with hypereosinophilia. Initial WBC was 72,300 with 53% eosinophils. Bone marrow on 03/29/2016 revealed marked increase in marrow eosinophils (approximate 30%). There was no diagnostic morphologic evidence of a myeloproliferative or lymphoid neoplasm. Flow cytometry revealed significant increase in eosinophils (54%). There was relative decreased myeloid cells with no significant immunophenotypic abnormalities or increase in blasts. There was no lymphoid abnormalities or evidence of clonality. FISH studies were negative for myeloproliferative neoplasms with eosinophilia (PDGFRA, PDGFRB, FGFR1). BCR-ABL was negative on 03/28/2016.  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  Left lower extremity duplex on 03/25/2016 revealed no evidence of DVT. Discomfort improved on Neurontin 300 mg a day and continues to improve with declining eosinophilia.  He received 41.4 Gy from 04/11/2016 - 05/23/2016.  He received 4 weeks of concurrent carboplatin and Taxol (04/17/2016 -  05/22/2016).    He has received 5 cycles of Keytruda (07/03/2016 - 10/12/2016).  He has tolerated treatment well.  He was diagnosed with aspiration pneumonia.  Chest CT on 06/16/2016 revealed multifocal airspace opacities within the right lung which had progressed since the prior CT, suspicious for postobstructive and/or aspiration pneumonia. There was a cavitary component in the right upper lobe posteriorly.  The dominant right upper lobe mass and the hilar adenopathy had improved.  He received 10 days of Levaquin.  He completed a course of Augmentin.  Chest, abdomen, and pelvic CT scan on 10/19/2016 revealed radiation changes in the right lung.  The superimposed right lung pneumonia had improved.  The 6.4 x 2.4 cm right perihilar mass had decreased.  There was improving thoracic lymphadenopathy.  There was progression of hepatic metastases, measuring up to 4.7 cm.  Specifically, there was a 3.9 x 3.7 cm metastasis in segment 8 (previously 2.5 x  2.8 cm) and a 4.7 x 4.0 cm metastasis in segment 4B (previously 3.1 x 2.6 cm).  PET scan on 11/14/2016 revealed 2 liver masses. Both were significantly more hypermetabolic than on 18/86/7737 and have enlarged compared to the prior PET-CT.  The segment 8 lesion was centrally necrotic but with high activity along its periphery (7.4), and about the same size as it was on 10/19/2016. The lateral segment left hepatic lobe lesion had enlarged (5.9 x 5.3 cm) compared to 10/19/16 (4.1 x 4.4 cm).  The bony metastatic lesions were no longer hypermetabolic and have resolved.  There were post therapy and postoperative findings in the right lung, with a considerable amount of suspected radiation pneumonitis and radiation fibrosis.  Symptomatically, he has a resolving upper respiratory tract infection.  He has a mild neuropathy in his left foot.  Weight is stable.  Code status is DNR/DNI.  Plan: 1.  Review PET scan and progressive liver disease.  Metastatic bone disease has  resolved.  Disease in chest is not active.   Discuss plan for follow-up with Dr. Aniceto Boss at [facility].  Discussion regarding tentative plan for the addition of carboplatin and Alimta to [facility].  Discuss side effects of treatment.  Discuss initiation of V66 and folic acid.  Discuss Decadron the day before and after treatment. 2.  Preauth carboplatin, Alimta, and Keytruda. 3.  B12 today. 4.  Rx:  folic acid 1 mg a day. 5.  RTC on 11/24/2016 for MD assessmnet, labs (CBC with diff, CMP, CEA, Mg), and Keytruda, carboplatin, and Alimta.  Addendum:  Spoke to Dr. Aniceto Boss.  Scans reviewed.  Encouraged by resolution of bone disease and improvement in chest.  Discussed plans for 2 more cycles of Keytruda, reimaging, and if progression, referral to [facility] for possible clinical trial enrollment.  Patient is agreeable.  Patient will return on 11/22/2016 for cycle #6 Keytruda.   Lequita Asal, MD  11/16/2016

## 2016-11-16 NOTE — Progress Notes (Signed)
Patient states he still has a lot of congestion.  No new diagnosis since last visit.

## 2016-11-16 NOTE — Progress Notes (Signed)
START ON PATHWAY REGIMEN - Non-Small Cell Lung  [id number]: Carboplatin AUC=5 + Pemetrexed 500 mg/m2 + Bevacizumab 15 mg/kg q21 Days x 4 Cycles   A cycle is every 21 days:     Carboplatin (Paraplatin(R)) AUC=5 in 250 mL NS IV over 1 hour Dose Mod: None     Pemetrexed (Alimta(R)) 500 mg/m2 in 100 mL NS IV over 10 minutes, manufacturer recommends not administering to patients with CrCl < 45 mL/min Dose Mod: None     Bevacizumab (Avastin(R)) 15 mg/kg in 100 mL NS IV over 90 minutes first infusion, 60 minutes second infusion and 30 minutes all subsequent infusions if tolerated Dose Mod: None Additional Orders: * All AUC calculations intended to be used in [employer] formula Note: Patient to receive the following prior to the initiation of therapy: 1) Dexamethasone 4 mg orally twice daily x 6 doses.  First dose 24 hours before chemotherapy. 2) Folic acid >= 041 mcg orally daily.  First dose at least 5 days prior to the first dose of pemetrexed. 3) Vitamin B12 1,000 mcg intramuscularly every 9 weeks.  First dose at least 5 days prior to the first dose of pemetrexed.  **Always confirm dose/schedule in your pharmacy ordering system**    Patient Characteristics: Stage IV Metastatic, Non Squamous, Second Line - Chemotherapy/Immunotherapy, PS = 0, 1, Prior PD-1/PD-L1 Inhibitor Check here if patient was staged using an edition prior to AJCC Staging - 8th Edition (i.e., prior to December 11, 2016)? false AJCC T Category: Staged < 8th Ed. Current Disease Status: Distant Metastases AJCC N Category: Staged < 8th Ed. AJCC M Category: M1c AJCC 8 Stage Grouping: IVB Histology: Non Squamous Cell ROS1 Rearrangement Status: Negative T790M Mutation Status: Not Applicable - EGFR Mutation Negative/Unknown Other Mutations/Biomarkers: No Other Actionable Mutations PD-L1 Expression Status: PD-L1 Positive >= 50% (TPS) Chemotherapy/Immunotherapy LOT: Second Line Chemotherapy/Immunotherapy Molecular Targeted Therapy: Not  Appropriate ALK Translocation Status: Negative Would you be surprised if this patient died  in the next year? I would be surprised if this patient died in the next year EGFR Mutation Status: Negative/Wild Type BRAF [id number] Mutation Status: Did Not Order Test Performance Status: PS = 0, 1 Immunotherapy Candidate Status: Not a Candidate for Immunotherapy Prior Immunotherapy Status: Prior PD-1/PD-L1 Inhibitor  Intent of Therapy: Non-Curative / Palliative Intent, Discussed with Patient

## 2016-11-17 ENCOUNTER — Telehealth: Payer: Self-pay | Admitting: *Deleted

## 2016-11-17 NOTE — Telephone Encounter (Signed)
Called pt to tell him that I got a message from [place] that she spoke to you already about.  She said that she spoke to Dr. Leonor Liv and he states to do 2 more regimen of keytruda and then rescan and if still same results on scan then he would e referred to [facility] for [place]. Clinical trial.  He states that this is exactly what she said.  She wants your Beryle Flock as soon as possible.  We have opening next week 12/12 at 1:15 for labs and 1:30 for infusion.  He is agreeable to this.  I have also made his next appt for 12/12/16 and I will put it in the mail for him and he is agreeable to this.  Also I told him that when he comes on Tuesday he can get a print out if he wants or just wait for it in the mail.  Just be sure that he comes next tues. And he said he will.

## 2016-11-21 ENCOUNTER — Inpatient Hospital Stay: Payer: Medicare Other

## 2016-11-22 ENCOUNTER — Other Ambulatory Visit: Payer: Self-pay | Admitting: Hematology and Oncology

## 2016-11-22 ENCOUNTER — Inpatient Hospital Stay: Payer: Medicare Other

## 2016-11-22 VITALS — BP 113/70 | HR 86 | Temp 96.6°F | Resp 18

## 2016-11-22 DIAGNOSIS — J069 Acute upper respiratory infection, unspecified: Secondary | ICD-10-CM | POA: Diagnosis not present

## 2016-11-22 DIAGNOSIS — Z79899 Other long term (current) drug therapy: Secondary | ICD-10-CM | POA: Diagnosis not present

## 2016-11-22 DIAGNOSIS — C3411 Malignant neoplasm of upper lobe, right bronchus or lung: Secondary | ICD-10-CM | POA: Diagnosis not present

## 2016-11-22 DIAGNOSIS — E538 Deficiency of other specified B group vitamins: Secondary | ICD-10-CM | POA: Diagnosis not present

## 2016-11-22 DIAGNOSIS — Z5112 Encounter for antineoplastic immunotherapy: Secondary | ICD-10-CM | POA: Diagnosis not present

## 2016-11-22 DIAGNOSIS — C7951 Secondary malignant neoplasm of bone: Secondary | ICD-10-CM

## 2016-11-22 DIAGNOSIS — C787 Secondary malignant neoplasm of liver and intrahepatic bile duct: Secondary | ICD-10-CM | POA: Diagnosis not present

## 2016-11-22 LAB — CBC WITH DIFFERENTIAL/PLATELET
Basophils Absolute: 0.1 10*3/uL (ref 0–0.1)
Basophils Relative: 2 %
Eosinophils Absolute: 0.2 10*3/uL (ref 0–0.7)
Eosinophils Relative: 2 %
HCT: 38.3 % — ABNORMAL LOW (ref 40.0–52.0)
Hemoglobin: 12.8 g/dL — ABNORMAL LOW (ref 13.0–18.0)
Lymphocytes Relative: 18 %
Lymphs Abs: 1.3 10*3/uL (ref 1.0–3.6)
MCH: 31.5 pg (ref 26.0–34.0)
MCHC: 33.3 g/dL (ref 32.0–36.0)
MCV: 94.6 fL (ref 80.0–100.0)
Monocytes Absolute: 1 10*3/uL (ref 0.2–1.0)
Monocytes Relative: 14 %
Neutro Abs: 4.7 10*3/uL (ref 1.4–6.5)
Neutrophils Relative %: 64 %
Platelets: 204 10*3/uL (ref 150–440)
RBC: 4.05 MIL/uL — ABNORMAL LOW (ref 4.40–5.90)
RDW: 13.4 % (ref 11.5–14.5)
WBC: 7.3 10*3/uL (ref 3.8–10.6)

## 2016-11-22 LAB — COMPREHENSIVE METABOLIC PANEL
ALT: 57 U/L (ref 17–63)
AST: 74 U/L — ABNORMAL HIGH (ref 15–41)
Albumin: 3.8 g/dL (ref 3.5–5.0)
Alkaline Phosphatase: 62 U/L (ref 38–126)
Anion gap: 7 (ref 5–15)
BUN: 17 mg/dL (ref 6–20)
CO2: 26 mmol/L (ref 22–32)
Calcium: 9.3 mg/dL (ref 8.9–10.3)
Chloride: 102 mmol/L (ref 101–111)
Creatinine, Ser: 0.74 mg/dL (ref 0.61–1.24)
GFR calc Af Amer: >60 mL/min (ref >60)
GFR calc non Af Amer: >60 mL/min (ref >60)
Glucose, Bld: 99 mg/dL (ref 65–99)
Potassium: 4.4 mmol/L (ref 3.5–5.1)
Sodium: 135 mmol/L (ref 135–145)
Total Bilirubin: 0.7 mg/dL (ref 0.3–1.2)
Total Protein: 8.1 g/dL (ref 6.5–8.1)

## 2016-11-22 LAB — MAGNESIUM: Magnesium: 1.8 mg/dL (ref 1.7–2.4)

## 2016-11-22 MED ORDER — SODIUM CHLORIDE 0.9 % IV SOLN
Freq: Once | INTRAVENOUS | Status: AC
  Administered 2016-11-22: 15:00:00 via INTRAVENOUS
  Filled 2016-11-22: qty 1000

## 2016-11-22 MED ORDER — PEMBROLIZUMAB CHEMO INJECTION 100 MG/4ML
200.0000 mg | Freq: Once | INTRAVENOUS | Status: AC
  Administered 2016-11-22: 200 mg via INTRAVENOUS
  Filled 2016-11-22: qty 8

## 2016-11-23 LAB — CEA: CEA: 2.8 ng/mL (ref 0.0–4.7)

## 2016-11-24 ENCOUNTER — Ambulatory Visit: Payer: Medicare Other

## 2016-11-24 ENCOUNTER — Other Ambulatory Visit: Payer: Medicare Other

## 2016-11-24 ENCOUNTER — Ambulatory Visit: Payer: Medicare Other | Admitting: Hematology and Oncology

## 2016-12-05 ENCOUNTER — Encounter: Payer: Self-pay | Admitting: Hematology and Oncology

## 2016-12-10 ENCOUNTER — Encounter: Payer: Self-pay | Admitting: Hematology and Oncology

## 2016-12-10 ENCOUNTER — Other Ambulatory Visit: Payer: Self-pay | Admitting: Hematology and Oncology

## 2016-12-10 DIAGNOSIS — C787 Secondary malignant neoplasm of liver and intrahepatic bile duct: Secondary | ICD-10-CM | POA: Insufficient documentation

## 2016-12-10 NOTE — Progress Notes (Signed)
Gerald [name] Clinic day:  12/12/2016   Chief Complaint: Gerald [name] is a 65 y.o. male with metastatic lung cancer and associated hypereosinophilia who is seen for assessment prior to cycle #7 Keytruda.  HPI:  The patient was last seen by me in the medical oncology clinic on 11/16/2016.  At that time, PET scan revealed resolution of bone lesions and improvement in disease in his chest.  There were 2 liver lesions.  Both were significantly more hypermetabolic than on 70/35/0093.  The segment 8 lesion was centrally necrotic but with high activity along its periphery (7.4), and about the same size as it was on 10/19/2016. The lateral segment left hepatic lobe lesion had enlarged (5.9 x 5.3 cm) compared to 10/19/16 (4.1 x 4.4 cm).    Preliminary discussion was for the addition of carboplatin and Alimta to [facility].  I spoke to Dr. Aniceto Boss at [facility].  We discussed plans for 2 more cycles of Keytruda, reimaging, and if progression, referral to [facility] for possible clinical trial enrollment.  He received cycle #6 Keytruda on 11/22/2016.  Symptomatically, he is feeling "ok".  He has been active.  He has shortness of breath on exertion.  He has had a little right upper quadrant pain.  He denies any nausea or vomiting.   Past Medical History:  Diagnosis Date  . Arthritis    Rheumatoid arthritis  . Cancer ([place])    lung ca   . Collagen vascular disease ([facility])    RA  . Mass of lung   . Pneumothorax    spontaneous  . Renal disorder    as a child    Past Surgical History:  Procedure Laterality Date  . FLEXIBLE BRONCHOSCOPY N/A 03/30/2016   Procedure: FLEXIBLE BRONCHOSCOPY;  Surgeon: Vilinda Boehringer, MD;  Location: [facility] ORS;  Service: Cardiopulmonary;  Laterality: N/A;  . Ureter repair as a child      Family History  Problem Relation Age of Onset  . Brain cancer Mother   . Lung cancer Maternal Uncle   . Cancer Sister     Metastatic cancer- unknown primary     Social History:  reports that he quit smoking about 16 years ago. His smoking use included Cigarettes. He has a 13.00 pack-year smoking history. He has never used smokeless tobacco. He reports that he drinks alcohol. He reports that he does not use drugs.  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  He will be getting a refrigerator soon.  He lives in [place].  Contact number is [phone].  He has been driving.  The patient is alone today.  Allergies:  Allergies  Allergen Reactions  . Nsaids Other (See Comments)    Pt is unable to take this class of medications.    . Sulfa Antibiotics Other (See Comments)    Reaction:  GI upset     Current Medications: Current Outpatient Prescriptions  Medication Sig Dispense Refill  . calcium-vitamin D (OSCAL WITH D) 500-200 MG-UNIT tablet Take 1 tablet by mouth 2 (two) times daily. 818 tablet 0  . folic acid (FOLVITE) 1 MG tablet Take 1 tablet (1 mg total) by mouth daily. 30 tablet 3  . gabapentin (NEURONTIN) 300 MG capsule Take 1 capsule (300 mg total) by mouth at bedtime. 30 capsule 2  . HYDROcodone-acetaminophen (NORCO) 7.5-325 MG tablet Take 1 tablet by mouth every 4 (four) hours as needed for moderate  pain (Every 4-6 hours as needed for pain). 30 tablet 0  . oxyCODONE-acetaminophen (PERCOCET/ROXICET) 5-325 MG tablet Take 1 tablet by mouth every 6 (six) hours as needed for severe pain. 30 tablet 0  . sucralfate (CARAFATE) 1 g tablet Take 1 tablet (1 g total) by mouth 3 (three) times daily. Dissolve in 2-3 tbsp warm water, swish and swallow. 90 tablet 3  . ondansetron (ZOFRAN) 8 MG tablet Take 1 tablet (8 mg total) by mouth 2 (two) times daily as needed for nausea or vomiting. (Patient not taking: Reported on 12/12/2016) 20 tablet 0  . oxyCODONE (OXY IR/ROXICODONE) 5 MG immediate release tablet Take 1 tablet (5 mg total) by mouth every 4 (four) hours as needed for severe pain. 30 tablet 0   No  current facility-administered medications for this visit.     Review of Systems:  GENERAL:  Feels "ok". No fever, chills or sweats.  Weight up 1 pound. PERFORMANCE STATUS (ECOG):  2 HEENT: No visual changes, sore throat, mouth sores or tenderness. Lungs:  Shortness of breath on exertion.  No cough.  No hemoptysis. Cardiac:  No chest pain, palpitations, orthopnea, or PND.  Sleeps in a recliner. GI:  Appetite good.  Intermittent RUQ pain.  No nausea, vomiting, diarrhea, constipation, melena or hematochezia. GU:  No urgency, frequency, dysuria, or hematuria. Musculoskeletal:  Right knee pain.  No back pain.  No muscle tenderness. Extremities:  No pain or swelling. Skin:  No rashes or skin changes. 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  No headache, focal weakness, balance or coordination issues. Endocrine:  No diabetes, thyroid issues, hot flashes or night sweats. Psych:  No mood changes, depression or anxiety. Pain:  No pain.  Review of systems:  All other systems reviewed and found to be negative.  Physical Exam: Blood pressure 109/69, pulse 90, temperature (!) 95.1 F (35.1 C), resp. rate 18, weight 134 lb 4.2 oz (60.9 kg). GENERAL:  Thin gentleman sitting comfortably in the exam room in no acute distress. MENTAL STATUS:  Alert and oriented to person, place and time. HEAD:  Wearing a camo cap.  Long gray hair in pony tail.  Goatee.  Temporal wasting.  Normocephalic, atraumatic, face symmetric, no Cushingoid features. EYES:  Blue eyes.  Pupils equal round and reactive to light and accomodation.  No conjunctivitis or scleral icterus. ENT:  Oropharynx clear without lesion.  Tongue normal. Mucous membranes moist.  RESPIRATORY:  Clear to auscultation without rales, wheezes or rhonchi. CARDIOVASCULAR:  Regular rate and rhythm without murmur, rub or gallop. ABDOMEN:  Soft, non-tender in RUQ.  No guarding or rebound tenderness.  Ative bowel sounds  and no hepatosplenomegaly.  No masses. SKIN:  No rashes, ulcers or lesions. EXTREMITIES: No edema, no skin discoloration or tenderness.  No palpable cords. LYMPH NODES: No palpable cervical, supraclavicular, axillary or inguinal adenopathy  NEUROLOGICAL: Unremarkable. PSYCH:  Appropriate.    Clinical Support on 12/12/2016  Component Date Value Ref Range Status  . WBC 12/12/2016 6.1  3.8 - 10.6 K/uL Final  . RBC 12/12/2016 4.06* 4.40 - 5.90 MIL/uL Final  . Hemoglobin 12/12/2016 13.0  13.0 - 18.0 g/dL Final  . HCT 12/12/2016 38.7* 40.0 - 52.0 % Final  . MCV 12/12/2016 95.4  80.0 - 100.0 fL Final  . MCH 12/12/2016 32.2  26.0 - 34.0 pg Final  . MCHC 12/12/2016 33.7  32.0 - 36.0 g/dL Final  . RDW 12/12/2016 14.2  11.5 -  14.5 % Final  . Platelets 12/12/2016 201  150 - 440 K/uL Final  . Neutrophils Relative % 12/12/2016 50  % Final  . Neutro Abs 12/12/2016 3.1  1.4 - 6.5 K/uL Final  . Lymphocytes Relative 12/12/2016 26  % Final  . Lymphs Abs 12/12/2016 1.6  1.0 - 3.6 K/uL Final  . Monocytes Relative 12/12/2016 15  % Final  . Monocytes Absolute 12/12/2016 0.9  0.2 - 1.0 K/uL Final  . Eosinophils Relative 12/12/2016 8  % Final  . Eosinophils Absolute 12/12/2016 0.5  0 - 0.7 K/uL Final  . Basophils Relative 12/12/2016 1  % Final  . Basophils Absolute 12/12/2016 0.1  0 - 0.1 K/uL Final  . Sodium 12/12/2016 138  135 - 145 mmol/L Final  . Potassium 12/12/2016 3.9  3.5 - 5.1 mmol/L Final  . Chloride 12/12/2016 105  101 - 111 mmol/L Final  . CO2 12/12/2016 25  22 - 32 mmol/L Final  . Glucose, Bld 12/12/2016 127* 65 - 99 mg/dL Final  . BUN 12/12/2016 21* 6 - 20 mg/dL Final  . Creatinine, Ser 12/12/2016 0.87  0.61 - 1.24 mg/dL Final  . Calcium 12/12/2016 9.1  8.9 - 10.3 mg/dL Final  . Total Protein 12/12/2016 8.5* 6.5 - 8.1 g/dL Final  . Albumin 12/12/2016 3.9  3.5 - 5.0 g/dL Final  . AST 12/12/2016 69* 15 - 41 U/L Final  . ALT 12/12/2016 49  17 - 63 U/L Final  . Alkaline Phosphatase  12/12/2016 74  38 - 126 U/L Final  . Total Bilirubin 12/12/2016 0.8  0.3 - 1.2 mg/dL Final  . GFR calc non Af Amer 12/12/2016 >60  >60 mL/min Final  . GFR calc Af Amer 12/12/2016 >60  >60 mL/min Final   Comment: (NOTE) The eGFR has been calculated using the CKD EPI equation. This calculation has not been validated in all clinical situations. eGFR's persistently <60 mL/min signify possible Chronic Kidney Disease.   . Anion gap 12/12/2016 8  5 - 15 Final  . Magnesium 12/12/2016 1.8  1.7 - 2.4 mg/dL Final    Assessment:  Gerald [name] is a 65 y.o. male with stage IV adenocarcinoma of the lung and associated leukocytosis with eosinophilia. He presented with a 30 pound weight loss, shortness of breath, cough, and left leg discomfort. He has a 13-15 pack year smoking history.  Chest CT angiogram on 03/27/2016 revealed an 8 cm mass in the medial right upper lobe. The mass abutted the mediastinum and possibly invaded the left atrium. There was associated widespread thoracic adenopathy. There was a 2.6 cm enhancing lesion in the medial segment of the left hepatic lobe.  Bronchoscopy on 03/30/2016 revealed an endobronchial lesion in the RUL anterior segment with 95% occlusion. There was extrinsic compression in the right middle lobe secondary to posterior mass effect. Pathology confirmed non-small cell lung cancer, favor adenocarcinoma.  PD-L1 testing revealed high expression (> 50%).  EGFR, ALK, and ROS1 were negative.  CEA was 2.3 on 03/27/2016.  PET scan on 04/05/2016 revealed intense hypermetabolism (SUV 17.95) associated with a 7.5 cm central perihilar right lung lesion. There was mediastinal (sub-carinal node SUV 8.89; 2.6 cm right paratracheal node SUV 17.49) and upper abdominal retroperitoneal hypermetabolic adenopathy (9 mm aortocaval node SUV 9.2). There were 2 liver metastasis (2.3 cm left lobe SUV 6.19; 2.2 cm segment VIII SUV 5.18). There are multifocal bone metastasis (C2 vertebral  body SUV 9.0; 1.6 cm left sacral ala lesion SUV of 8.6).  Head MRI on 03/31/2016 was indeterminate for early metastatic disease to the brain. There was a small focus of gyral enhancement in the anterior right frontal lobe likely is post ischemic, while a small posterior right cerebellar enhancing lesion was more suspicious for small brain metastasis. There were multiple small primarily subacute appearing infarcts in the bilateral MCA and left PICA territories.   Head MRI on 05/12/2016 revealed expected interval evolution of bilateral cerebral in cerebellar infarcts. There was resolution of right frontal and right cerebellar enhancement consistent with subacute infarcts. There was a new 3 mm focus of gyral enhancement in the left occipital lobe and a possible new 4 mm enhancing lesion in the left cerebellum (? vascular enhancement versus tiny metastasis).   Head MRI on 08/16/2016 revealed multiple areas of improving enhancement and restricted diffusion compatible with resolving subacute infarcts.  There was no evidence of metastatic disease or acute infarction   Bone scan on 04/07/2016 revealed a subtle distal right femur lesion which might be a small metastasis.  Bone scan is felt to be an unreliable modality for evaluation of osseous metastatic disease as the bone metastases seen on the recent PET and MRI were not evident.  He receives [place] monthly (last 09/20/2016).  He had marked leukocytosis with hypereosinophilia. Initial WBC was 72,300 with 53% eosinophils. Bone marrow on 03/29/2016 revealed marked increase in marrow eosinophils (approximate 30%). There was no diagnostic morphologic evidence of a myeloproliferative or lymphoid neoplasm. Flow cytometry revealed significant increase in eosinophils (54%). There was relative decreased myeloid cells with no significant immunophenotypic abnormalities or increase in blasts. There was no lymphoid abnormalities or evidence of clonality. FISH  studies were negative for myeloproliferative neoplasms with eosinophilia (PDGFRA, PDGFRB, FGFR1). BCR-ABL was negative on 03/28/2016.  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  Left lower extremity duplex on 03/25/2016 revealed no evidence of DVT. Discomfort improved on Neurontin 300 mg a day and continues to improve with declining eosinophilia.  He received 41.4 Gy from 04/11/2016 - 05/23/2016.  He received 4 weeks of concurrent carboplatin and Taxol (04/17/2016 - 05/22/2016).    He has received 6 cycles of Keytruda (07/03/2016 - 10/12/2016; 11/22/2016).  He has tolerated treatment well.  He was diagnosed with aspiration pneumonia.  Chest CT on 06/16/2016 revealed multifocal airspace opacities within the right lung which had progressed since the prior CT, suspicious for postobstructive and/or aspiration pneumonia. There was a cavitary component in the right upper lobe posteriorly.  The dominant right upper lobe mass and the hilar adenopathy had improved.  He received 10 days of Levaquin.  He completed a course of Augmentin.  Chest, abdomen, and pelvic CT scan on 10/19/2016 revealed radiation changes in the right lung.  The superimposed right lung pneumonia had improved.  The 6.4 x 2.4 cm right perihilar mass had decreased.  There was improving thoracic lymphadenopathy.  There was progression of hepatic metastases, measuring up to 4.7 cm.  Specifically, there was a 3.9 x 3.7 cm metastasis in segment 8 (previously 2.5 x 2.8 cm) and a 4.7 x 4.0 cm metastasis in segment 4B (previously 3.1 x 2.6 cm).  PET scan on 11/14/2016 revealed 2 liver masses. Both were significantly more hypermetabolic than on 82/95/6213 and have enlarged compared to the prior PET-CT.  The segment 8 lesion was centrally necrotic but with high activity along its periphery (7.4), and about the same size as it was on 10/19/2016. The lateral segment left hepatic lobe lesion  had  enlarged (5.9 x 5.3 cm) compared to 10/19/16 (4.1 x 4.4 cm).  The bony metastatic lesions were no longer hypermetabolic and have resolved.  There were post therapy and postoperative findings in the right lung, with a considerable amount of suspected radiation pneumonitis and radiation fibrosis.  Symptomatically, he is doing well.  He has intermittent right upper quadrant pain.  Weight is up 1 pound.  Code status is DNR/DNI.  Plan: 1.  Labs today:  CBC with diff, CMP, Mg, TSH. 2.  Cycle #7 Keytruda. 3.  Rx:  oxycodone 5 mg po q 4-6 hrs prn [pain; dis: #30. 4.  Schedule chest, abdomen, and pelvic CT scan in 2 1/2 weeks. 5.  RTC in 3 weeks for MD assessment, labs (CBC with diff, CMP, Mg), review of scans, and +/- cycle #8 Keytruda.   Lequita Asal, MD  12/12/2016, 9:13 AM

## 2016-12-12 ENCOUNTER — Inpatient Hospital Stay: Payer: Medicare Other | Admitting: *Deleted

## 2016-12-12 ENCOUNTER — Inpatient Hospital Stay: Payer: Medicare Other

## 2016-12-12 ENCOUNTER — Inpatient Hospital Stay: Payer: Medicare Other | Admitting: Hematology and Oncology

## 2016-12-12 ENCOUNTER — Inpatient Hospital Stay: Payer: Medicare Other | Attending: Hematology and Oncology | Admitting: Hematology and Oncology

## 2016-12-12 VITALS — BP 109/69 | HR 90 | Temp 95.1°F | Resp 18 | Wt 134.3 lb

## 2016-12-12 DIAGNOSIS — Z8701 Personal history of pneumonia (recurrent): Secondary | ICD-10-CM

## 2016-12-12 DIAGNOSIS — Z808 Family history of malignant neoplasm of other organs or systems: Secondary | ICD-10-CM

## 2016-12-12 DIAGNOSIS — Z801 Family history of malignant neoplasm of trachea, bronchus and lung: Secondary | ICD-10-CM

## 2016-12-12 DIAGNOSIS — C3411 Malignant neoplasm of upper lobe, right bronchus or lung: Secondary | ICD-10-CM | POA: Insufficient documentation

## 2016-12-12 DIAGNOSIS — Z809 Family history of malignant neoplasm, unspecified: Secondary | ICD-10-CM

## 2016-12-12 DIAGNOSIS — I998 Other disorder of circulatory system: Secondary | ICD-10-CM | POA: Diagnosis not present

## 2016-12-12 DIAGNOSIS — Z87448 Personal history of other diseases of urinary system: Secondary | ICD-10-CM

## 2016-12-12 DIAGNOSIS — Z79899 Other long term (current) drug therapy: Secondary | ICD-10-CM

## 2016-12-12 DIAGNOSIS — C787 Secondary malignant neoplasm of liver and intrahepatic bile duct: Secondary | ICD-10-CM

## 2016-12-12 DIAGNOSIS — Z66 Do not resuscitate: Secondary | ICD-10-CM

## 2016-12-12 DIAGNOSIS — Z5112 Encounter for antineoplastic immunotherapy: Secondary | ICD-10-CM | POA: Diagnosis not present

## 2016-12-12 DIAGNOSIS — Z77090 Contact with and (suspected) exposure to asbestos: Secondary | ICD-10-CM | POA: Diagnosis not present

## 2016-12-12 DIAGNOSIS — R1011 Right upper quadrant pain: Secondary | ICD-10-CM | POA: Diagnosis not present

## 2016-12-12 DIAGNOSIS — C7951 Secondary malignant neoplasm of bone: Secondary | ICD-10-CM

## 2016-12-12 DIAGNOSIS — M069 Rheumatoid arthritis, unspecified: Secondary | ICD-10-CM

## 2016-12-12 DIAGNOSIS — G939 Disorder of brain, unspecified: Secondary | ICD-10-CM | POA: Diagnosis not present

## 2016-12-12 DIAGNOSIS — D72829 Elevated white blood cell count, unspecified: Secondary | ICD-10-CM

## 2016-12-12 DIAGNOSIS — Z87891 Personal history of nicotine dependence: Secondary | ICD-10-CM

## 2016-12-12 DIAGNOSIS — G893 Neoplasm related pain (acute) (chronic): Secondary | ICD-10-CM

## 2016-12-12 DIAGNOSIS — Z23 Encounter for immunization: Secondary | ICD-10-CM

## 2016-12-12 DIAGNOSIS — D721 Eosinophilia: Secondary | ICD-10-CM

## 2016-12-12 LAB — CBC WITH DIFFERENTIAL/PLATELET
Basophils Absolute: 0.1 10*3/uL (ref 0–0.1)
Basophils Relative: 1 %
Eosinophils Absolute: 0.5 10*3/uL (ref 0–0.7)
Eosinophils Relative: 8 %
HCT: 38.7 % — ABNORMAL LOW (ref 40.0–52.0)
Hemoglobin: 13 g/dL (ref 13.0–18.0)
Lymphocytes Relative: 26 %
Lymphs Abs: 1.6 10*3/uL (ref 1.0–3.6)
MCH: 32.2 pg (ref 26.0–34.0)
MCHC: 33.7 g/dL (ref 32.0–36.0)
MCV: 95.4 fL (ref 80.0–100.0)
Monocytes Absolute: 0.9 10*3/uL (ref 0.2–1.0)
Monocytes Relative: 15 %
Neutro Abs: 3.1 10*3/uL (ref 1.4–6.5)
Neutrophils Relative %: 50 %
Platelets: 201 10*3/uL (ref 150–440)
RBC: 4.06 MIL/uL — ABNORMAL LOW (ref 4.40–5.90)
RDW: 14.2 % (ref 11.5–14.5)
WBC: 6.1 10*3/uL (ref 3.8–10.6)

## 2016-12-12 LAB — COMPREHENSIVE METABOLIC PANEL
ALT: 49 U/L (ref 17–63)
AST: 69 U/L — ABNORMAL HIGH (ref 15–41)
Albumin: 3.9 g/dL (ref 3.5–5.0)
Alkaline Phosphatase: 74 U/L (ref 38–126)
Anion gap: 8 (ref 5–15)
BUN: 21 mg/dL — ABNORMAL HIGH (ref 6–20)
CO2: 25 mmol/L (ref 22–32)
Calcium: 9.1 mg/dL (ref 8.9–10.3)
Chloride: 105 mmol/L (ref 101–111)
Creatinine, Ser: 0.87 mg/dL (ref 0.61–1.24)
GFR calc Af Amer: >60 mL/min (ref >60)
GFR calc non Af Amer: >60 mL/min (ref >60)
Glucose, Bld: 127 mg/dL — ABNORMAL HIGH (ref 65–99)
Potassium: 3.9 mmol/L (ref 3.5–5.1)
Sodium: 138 mmol/L (ref 135–145)
Total Bilirubin: 0.8 mg/dL (ref 0.3–1.2)
Total Protein: 8.5 g/dL — ABNORMAL HIGH (ref 6.5–8.1)

## 2016-12-12 LAB — MAGNESIUM: Magnesium: 1.8 mg/dL (ref 1.7–2.4)

## 2016-12-12 MED ORDER — SODIUM CHLORIDE 0.9 % IV SOLN
200.0000 mg | Freq: Once | INTRAVENOUS | Status: AC
  Administered 2016-12-12: 200 mg via INTRAVENOUS
  Filled 2016-12-12: qty 8

## 2016-12-12 MED ORDER — INFLUENZA VAC SPLIT QUAD 0.5 ML IM SUSY
0.5000 mL | PREFILLED_SYRINGE | Freq: Once | INTRAMUSCULAR | Status: AC
  Administered 2016-12-12: 0.5 mL via INTRAMUSCULAR
  Filled 2016-12-12: qty 0.5

## 2016-12-12 MED ORDER — SODIUM CHLORIDE 0.9 % IV SOLN
Freq: Once | INTRAVENOUS | Status: AC
  Administered 2016-12-12: 10:00:00 via INTRAVENOUS
  Filled 2016-12-12: qty 1000

## 2016-12-12 MED ORDER — OXYCODONE HCL 5 MG PO TABS: 5.0000 mg | ORAL_TABLET | ORAL | 0 refills | Status: DC | PRN

## 2016-12-12 NOTE — Progress Notes (Signed)
Patient states when he is up moving around or driving he has noticed he has some increased pain in his liver area.  He also has exertional SOB.  Further states he wants to discuss decreasing his acetaminophen with his next pain medication prescription.

## 2016-12-28 ENCOUNTER — Ambulatory Visit: Admission: RE | Admit: 2016-12-28 | Payer: Medicare Other | Source: Ambulatory Visit

## 2016-12-29 ENCOUNTER — Ambulatory Visit
Admission: RE | Admit: 2016-12-29 | Discharge: 2016-12-29 | Disposition: A | Discharge status: Home / Self Care | Payer: Medicare Other | Source: Ambulatory Visit | Attending: Hematology and Oncology | Admitting: Hematology and Oncology

## 2016-12-29 DIAGNOSIS — C3411 Malignant neoplasm of upper lobe, right bronchus or lung: Secondary | ICD-10-CM | POA: Insufficient documentation

## 2016-12-29 DIAGNOSIS — N280 Ischemia and infarction of kidney: Secondary | ICD-10-CM | POA: Insufficient documentation

## 2016-12-29 DIAGNOSIS — I2510 Atherosclerotic heart disease of native coronary artery without angina pectoris: Secondary | ICD-10-CM | POA: Diagnosis not present

## 2016-12-29 DIAGNOSIS — C787 Secondary malignant neoplasm of liver and intrahepatic bile duct: Secondary | ICD-10-CM | POA: Insufficient documentation

## 2016-12-29 DIAGNOSIS — I700 Atherosclerosis of aorta: Secondary | ICD-10-CM | POA: Diagnosis not present

## 2016-12-29 DIAGNOSIS — K5900 Constipation, unspecified: Secondary | ICD-10-CM | POA: Diagnosis not present

## 2016-12-29 DIAGNOSIS — G893 Neoplasm related pain (acute) (chronic): Secondary | ICD-10-CM | POA: Diagnosis not present

## 2016-12-29 DIAGNOSIS — C7951 Secondary malignant neoplasm of bone: Secondary | ICD-10-CM | POA: Insufficient documentation

## 2016-12-29 DIAGNOSIS — C419 Malignant neoplasm of bone and articular cartilage, unspecified: Secondary | ICD-10-CM | POA: Diagnosis not present

## 2016-12-29 DIAGNOSIS — J90 Pleural effusion, not elsewhere classified: Secondary | ICD-10-CM | POA: Insufficient documentation

## 2016-12-29 MED ORDER — IOPAMIDOL (ISOVUE-300) INJECTION 61%
100.0000 mL | Freq: Once | INTRAVENOUS | Status: AC | PRN
  Administered 2016-12-29: 100 mL via INTRAVENOUS

## 2017-01-02 ENCOUNTER — Other Ambulatory Visit: Payer: Self-pay | Admitting: Hematology and Oncology

## 2017-01-02 ENCOUNTER — Inpatient Hospital Stay: Payer: Medicare Other

## 2017-01-02 ENCOUNTER — Inpatient Hospital Stay (HOSPITAL_BASED_OUTPATIENT_CLINIC_OR_DEPARTMENT_OTHER): Payer: Medicare Other | Admitting: Hematology and Oncology

## 2017-01-02 ENCOUNTER — Encounter: Payer: Self-pay | Admitting: Hematology and Oncology

## 2017-01-02 VITALS — BP 104/65 | HR 92 | Temp 95.3°F | Resp 18 | Wt 135.1 lb

## 2017-01-02 DIAGNOSIS — G939 Disorder of brain, unspecified: Secondary | ICD-10-CM

## 2017-01-02 DIAGNOSIS — I998 Other disorder of circulatory system: Secondary | ICD-10-CM

## 2017-01-02 DIAGNOSIS — D72829 Elevated white blood cell count, unspecified: Secondary | ICD-10-CM

## 2017-01-02 DIAGNOSIS — C787 Secondary malignant neoplasm of liver and intrahepatic bile duct: Secondary | ICD-10-CM

## 2017-01-02 DIAGNOSIS — M069 Rheumatoid arthritis, unspecified: Secondary | ICD-10-CM | POA: Diagnosis not present

## 2017-01-02 DIAGNOSIS — C7951 Secondary malignant neoplasm of bone: Secondary | ICD-10-CM

## 2017-01-02 DIAGNOSIS — Z87891 Personal history of nicotine dependence: Secondary | ICD-10-CM | POA: Diagnosis not present

## 2017-01-02 DIAGNOSIS — C3411 Malignant neoplasm of upper lobe, right bronchus or lung: Secondary | ICD-10-CM

## 2017-01-02 DIAGNOSIS — D721 Eosinophilia: Secondary | ICD-10-CM

## 2017-01-02 DIAGNOSIS — R1011 Right upper quadrant pain: Secondary | ICD-10-CM

## 2017-01-02 DIAGNOSIS — Z808 Family history of malignant neoplasm of other organs or systems: Secondary | ICD-10-CM

## 2017-01-02 DIAGNOSIS — Z77090 Contact with and (suspected) exposure to asbestos: Secondary | ICD-10-CM

## 2017-01-02 DIAGNOSIS — Z87448 Personal history of other diseases of urinary system: Secondary | ICD-10-CM

## 2017-01-02 DIAGNOSIS — G893 Neoplasm related pain (acute) (chronic): Secondary | ICD-10-CM

## 2017-01-02 DIAGNOSIS — Z809 Family history of malignant neoplasm, unspecified: Secondary | ICD-10-CM

## 2017-01-02 DIAGNOSIS — Z5112 Encounter for antineoplastic immunotherapy: Secondary | ICD-10-CM | POA: Insufficient documentation

## 2017-01-02 DIAGNOSIS — Z801 Family history of malignant neoplasm of trachea, bronchus and lung: Secondary | ICD-10-CM

## 2017-01-02 DIAGNOSIS — Z79899 Other long term (current) drug therapy: Secondary | ICD-10-CM

## 2017-01-02 DIAGNOSIS — Z8701 Personal history of pneumonia (recurrent): Secondary | ICD-10-CM

## 2017-01-02 DIAGNOSIS — Z66 Do not resuscitate: Secondary | ICD-10-CM

## 2017-01-02 LAB — COMPREHENSIVE METABOLIC PANEL
ALT: 48 U/L (ref 17–63)
AST: 62 U/L — ABNORMAL HIGH (ref 15–41)
Albumin: 4 g/dL (ref 3.5–5.0)
Alkaline Phosphatase: 71 U/L (ref 38–126)
Anion gap: 5 (ref 5–15)
BUN: 20 mg/dL (ref 6–20)
CO2: 27 mmol/L (ref 22–32)
Calcium: 9.2 mg/dL (ref 8.9–10.3)
Chloride: 105 mmol/L (ref 101–111)
Creatinine, Ser: 0.79 mg/dL (ref 0.61–1.24)
GFR calc Af Amer: >60 mL/min (ref >60)
GFR calc non Af Amer: >60 mL/min (ref >60)
Glucose, Bld: 102 mg/dL — ABNORMAL HIGH (ref 65–99)
Potassium: 4.4 mmol/L (ref 3.5–5.1)
Sodium: 137 mmol/L (ref 135–145)
Total Bilirubin: 0.5 mg/dL (ref 0.3–1.2)
Total Protein: 8.4 g/dL — ABNORMAL HIGH (ref 6.5–8.1)

## 2017-01-02 LAB — CBC WITH DIFFERENTIAL/PLATELET
Basophils Absolute: 0.2 10*3/uL — ABNORMAL HIGH (ref 0–0.1)
Basophils Relative: 3 %
Eosinophils Absolute: 0.4 10*3/uL (ref 0–0.7)
Eosinophils Relative: 7 %
HCT: 39.4 % — ABNORMAL LOW (ref 40.0–52.0)
Hemoglobin: 13.4 g/dL (ref 13.0–18.0)
Lymphocytes Relative: 26 %
Lymphs Abs: 1.7 10*3/uL (ref 1.0–3.6)
MCH: 32.5 pg (ref 26.0–34.0)
MCHC: 34 g/dL (ref 32.0–36.0)
MCV: 95.7 fL (ref 80.0–100.0)
Monocytes Absolute: 1 10*3/uL (ref 0.2–1.0)
Monocytes Relative: 15 %
Neutro Abs: 3.3 10*3/uL (ref 1.4–6.5)
Neutrophils Relative %: 49 %
Platelets: 201 10*3/uL (ref 150–440)
RBC: 4.12 MIL/uL — ABNORMAL LOW (ref 4.40–5.90)
RDW: 14 % (ref 11.5–14.5)
WBC: 6.5 10*3/uL (ref 3.8–10.6)

## 2017-01-02 LAB — TSH: TSH: 4.009 u[IU]/mL (ref 0.350–4.500)

## 2017-01-02 LAB — MAGNESIUM: Magnesium: 1.7 mg/dL (ref 1.7–2.4)

## 2017-01-02 MED ORDER — SODIUM CHLORIDE 0.9 % IV SOLN
200.0000 mg | Freq: Once | INTRAVENOUS | Status: AC
  Administered 2017-01-02: 200 mg via INTRAVENOUS
  Filled 2017-01-02: qty 8

## 2017-01-02 MED ORDER — SODIUM CHLORIDE 0.9 % IV SOLN
Freq: Once | INTRAVENOUS | Status: AC
  Administered 2017-01-02: 10:00:00 via INTRAVENOUS
  Filled 2017-01-02: qty 1000

## 2017-01-02 MED ORDER — DENOSUMAB 120 MG/1.7ML ~~LOC~~ SOLN
120.0000 mg | Freq: Once | SUBCUTANEOUS | Status: AC
  Administered 2017-01-02: 120 mg via SUBCUTANEOUS
  Filled 2017-01-02: qty 1.7

## 2017-01-02 NOTE — Progress Notes (Signed)
[facility] day:  01/02/2017   Chief Complaint: Gerald [name] is a 66 y.o. male with metastatic lung cancer and associated hypereosinophilia who is seen for review of interval restaging studies and assessment prior to cycle #8 Keytruda.  HPI:  The patient was last seen in the medical oncology clinic on 12/12/2016.  At that time, he had intermittent right upper quadrant pain.  He was active.  He gained a pound.  He received cycle #7 Keytruda.  Restaging studies were scheduled.  Chest, abdomen, and pelvic CT scan on 12/29/2016 revealed mild reduction in size and central necrosis of the hepatic metastatic lesions.  There was continued post therapy related findings along the right hilar region. Dominant right perihilar mass was reduced in thickness compared to 10/19/2016. The small loculated right pleural effusion posteriorly at about this level was also reduced in size.  Comparing to the prior hypermetabolic left pelvic bony metastatic lesions shown on prior PET-CT of 04/05/2016, no current CT abnormality was seen in these locations.  Symptomatically, he has done well.  He notes a "twinge from time to time".  He has been active stacking fire wood.  He changed his alternator on the car.   Past Medical History:  Diagnosis Date  . Arthritis    Rheumatoid arthritis  . Cancer ([place])    lung ca   . Collagen vascular disease ([facility])    RA  . Mass of lung   . Pneumothorax    spontaneous  . Renal disorder    as a child    Past Surgical History:  Procedure Laterality Date  . FLEXIBLE BRONCHOSCOPY N/A 03/30/2016   Procedure: FLEXIBLE BRONCHOSCOPY;  Surgeon: Vilinda Boehringer, MD;  Location: [facility] ORS;  Service: Cardiopulmonary;  Laterality: N/A;  . Ureter repair as a child      Family History  Problem Relation Age of Onset  . Brain cancer Mother   . Lung cancer Maternal Uncle   . Cancer Sister     Metastatic cancer- unknown primary    Social History:   reports that he quit smoking about 16 years ago. His smoking use included Cigarettes. He has a 13.00 pack-year smoking history. He has never used smokeless tobacco. He reports that he drinks alcohol. He reports that he does not use drugs.  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  He will be getting a refrigerator soon.  He lives in [place].  Contact number is [phone].  The patient is alone today.  Allergies:  Allergies  Allergen Reactions  . Nsaids Other (See Comments)    Pt is unable to take this class of medications.    . Sulfa Antibiotics Other (See Comments)    Reaction:  GI upset     Current Medications: Current Outpatient Prescriptions  Medication Sig Dispense Refill  . calcium-vitamin D (OSCAL WITH D) 500-200 MG-UNIT tablet Take 1 tablet by mouth 2 (two) times daily. 621 tablet 0  . folic acid (FOLVITE) 1 MG tablet Take 1 tablet (1 mg total) by mouth daily. 30 tablet 3  . gabapentin (NEURONTIN) 300 MG capsule Take 1 capsule (300 mg total) by mouth at bedtime. 30 capsule 2  . HYDROcodone-acetaminophen (NORCO) 7.5-325 MG tablet Take 1 tablet by mouth every 4 (four) hours as needed for moderate pain (Every 4-6 hours as needed for pain). 30 tablet 0  . oxyCODONE (OXY IR/ROXICODONE) 5 MG immediate  release tablet Take 1 tablet (5 mg total) by mouth every 4 (four) hours as needed for severe pain. 30 tablet 0  . oxyCODONE-acetaminophen (PERCOCET/ROXICET) 5-325 MG tablet Take 1 tablet by mouth every 6 (six) hours as needed for severe pain. 30 tablet 0  . sucralfate (CARAFATE) 1 g tablet Take 1 tablet (1 g total) by mouth 3 (three) times daily. Dissolve in 2-3 tbsp warm water, swish and swallow. 90 tablet 3  . ondansetron (ZOFRAN) 8 MG tablet Take 1 tablet (8 mg total) by mouth 2 (two) times daily as needed for nausea or vomiting. (Patient not taking: Reported on 12/12/2016) 20 tablet 0   No current facility-administered medications for  this visit.     Review of Systems:  GENERAL:  Feels "pretty good". No fever, chills or sweats.  Weight up 1 pound. PERFORMANCE STATUS (ECOG):  2 HEENT: No visual changes, sore throat, mouth sores or tenderness. Lungs: No shortness of breath or cough. No hemoptysis. Cardiac:  No chest pain, palpitations, orthopnea, or PND.  Sleeps in a recliner. GI:  Appetite good.  Twinge in RUQ, time to time.  No nausea, vomiting, diarrhea, constipation, melena or hematochezia. GU:  No urgency, frequency, dysuria, or hematuria. Musculoskeletal:  Chronic right knee pain.  No back pain.  No muscle tenderness. Extremities:  No pain or swelling. Skin:  No rashes or skin changes. 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  No headache, focal weakness, balance or coordination issues. Endocrine:  No diabetes, thyroid issues, hot flashes or night sweats. Psych:  No mood changes, depression or anxiety. Pain:  Rare twinge in RUQ.  Review of systems:  All other systems reviewed and found to be negative.  Physical Exam: Blood pressure 104/65, pulse 92, temperature (!) 95.3 F (35.2 C), temperature source Tympanic, resp. rate 18, weight 135 lb 2.3 oz (61.3 kg). GENERAL:  Thin gentleman sitting comfortably in the exam room in no acute distress. MENTAL STATUS:  Alert and oriented to person, place and time. HEAD:Wearing a camo cap. Long gray hair in pony tail. [name]. Temporal wasting. Normocephalic, atraumatic, face symmetric, no Cushingoid features. EYES:Blue eyes. Pupils equal round and reactive to light and accomodation. No conjunctivitis or scleral icterus. JXB:JYNWGNFAOZ clear without lesion. Tonguenormal. Mucous membranes moist. RESPIRATORY:Clear to auscultationwithout rales, wheezes or rhonchi. CARDIOVASCULAR:Regular rate andrhythmwithout murmur, rub or gallop. ABDOMEN:Soft, non-tender in RUQ.  No guarding or rebound tenderness.  Ative  bowel sounds and no hepatosplenomegaly. No masses. SKIN: No rashes, ulcers or lesions. EXTREMITIES: No edema, no skin discoloration or tenderness. No palpable cords. LYMPHNODES: No palpable cervical, supraclavicular, axillary or inguinal adenopathy  NEUROLOGICAL: Unremarkable. PSYCH: Appropriate.    Appointment on 01/02/2017  Component Date Value Ref Range Status  . WBC 01/02/2017 6.5  3.8 - 10.6 K/uL Final  . RBC 01/02/2017 4.12* 4.40 - 5.90 MIL/uL Final  . Hemoglobin 01/02/2017 13.4  13.0 - 18.0 g/dL Final  . HCT 01/02/2017 39.4* 40.0 - 52.0 % Final  . MCV 01/02/2017 95.7  80.0 - 100.0 fL Final  . MCH 01/02/2017 32.5  26.0 - 34.0 pg Final  . MCHC 01/02/2017 34.0  32.0 - 36.0 g/dL Final  . RDW 01/02/2017 14.0  11.5 - 14.5 % Final  . Platelets 01/02/2017 201  150 - 440 K/uL Final  . Neutrophils Relative % 01/02/2017 49  % Final  . Neutro Abs 01/02/2017 3.3  1.4 - 6.5 K/uL Final  . Lymphocytes Relative 01/02/2017  26  % Final  . Lymphs Abs 01/02/2017 1.7  1.0 - 3.6 K/uL Final  . Monocytes Relative 01/02/2017 15  % Final  . Monocytes Absolute 01/02/2017 1.0  0.2 - 1.0 K/uL Final  . Eosinophils Relative 01/02/2017 7  % Final  . Eosinophils Absolute 01/02/2017 0.4  0 - 0.7 K/uL Final  . Basophils Relative 01/02/2017 3  % Final  . Basophils Absolute 01/02/2017 0.2* 0 - 0.1 K/uL Final  . Sodium 01/02/2017 137  135 - 145 mmol/L Final  . Potassium 01/02/2017 4.4  3.5 - 5.1 mmol/L Final  . Chloride 01/02/2017 105  101 - 111 mmol/L Final  . CO2 01/02/2017 27  22 - 32 mmol/L Final  . Glucose, Bld 01/02/2017 102* 65 - 99 mg/dL Final  . BUN 50/02/6010 20  6 - 20 mg/dL Final  . Creatinine, Ser 01/02/2017 0.79  0.61 - 1.24 mg/dL Final  . Calcium 26/86/4859 9.2  8.9 - 10.3 mg/dL Final  . Total Protein 01/02/2017 8.4* 6.5 - 8.1 g/dL Final  . Albumin 84/88/5841 4.0  3.5 - 5.0 g/dL Final  . AST 86/72/9267 62* 15 - 41 U/L Final  . ALT 01/02/2017 48  17 - 63 U/L Final  . Alkaline Phosphatase  01/02/2017 71  38 - 126 U/L Final  . Total Bilirubin 01/02/2017 0.5  0.3 - 1.2 mg/dL Final  . GFR calc non Af Amer 01/02/2017 >60  >60 mL/min Final  . GFR calc Af Amer 01/02/2017 >60  >60 mL/min Final   Comment: (NOTE) The eGFR has been calculated using the CKD EPI equation. This calculation has not been validated in all clinical situations. eGFR's persistently <60 mL/min signify possible Chronic Kidney Disease.   . Anion gap 01/02/2017 5  5 - 15 Final  . Magnesium 01/02/2017 1.7  1.7 - 2.4 mg/dL Final  . TSH 28/20/5410 4.009  0.350 - 4.500 uIU/mL Final    Assessment:  Jermany Rimel is a 66 y.o. male with stage IV adenocarcinoma of the lung and associated leukocytosis with eosinophilia. He presented with a 30 pound weight loss, shortness of breath, cough, and left leg discomfort. He has a 13-15 pack year smoking history.  Chest CT angiogram on 03/27/2016 revealed an 8 cm mass in the medial right upper lobe. The mass abutted the mediastinum and possibly invaded the left atrium. There was associated widespread thoracic adenopathy. There was a 2.6 cm enhancing lesion in the medial segment of the left hepatic lobe.  Bronchoscopy on 03/30/2016 revealed an endobronchial lesion in the RUL anterior segment with 95% occlusion. There was extrinsic compression in the right middle lobe secondary to posterior mass effect. Pathology confirmed non-small cell lung cancer, favor adenocarcinoma.  PD-L1 testing revealed high expression (> 50%).  EGFR, ALK, and ROS1 were negative.  CEA was 2.3 on 03/27/2016.  PET scan on 04/05/2016 revealed intense hypermetabolism (SUV 17.95) associated with a 7.5 cm central perihilar right lung lesion. There was mediastinal (sub-carinal node SUV 8.89; 2.6 cm right paratracheal node SUV 17.49) and upper abdominal retroperitoneal hypermetabolic adenopathy (9 mm aortocaval node SUV 9.2). There were 2 liver metastasis (2.3 cm left lobe SUV 6.19; 2.2 cm segment VIII SUV 5.18).  There are multifocal bone metastasis (C2 vertebral body SUV 9.0; 1.6 cm left sacral ala lesion SUV of 8.6).   Head MRI on 03/31/2016 was indeterminate for early metastatic disease to the brain. There was a small focus of gyral enhancement in the anterior right frontal lobe likely is post  ischemic, while a small posterior right cerebellar enhancing lesion was more suspicious for small brain metastasis. There were multiple small primarily subacute appearing infarcts in the bilateral MCA and left PICA territories.   Head MRI on 05/12/2016 revealed expected interval evolution of bilateral cerebral in cerebellar infarcts. There was resolution of right frontal and right cerebellar enhancement consistent with subacute infarcts. There was a new 3 mm focus of gyral enhancement in the left occipital lobe and a possible new 4 mm enhancing lesion in the left cerebellum (? vascular enhancement versus tiny metastasis).   Head MRI on 08/16/2016 revealed multiple areas of improving enhancement and restricted diffusion compatible with resolving subacute infarcts.  There was no evidence of metastatic disease or acute infarction   Bone scan on 04/07/2016 revealed a subtle distal right femur lesion which might be a small metastasis.  Bone scan is felt to be an unreliable modality for evaluation of osseous metastatic disease as the bone metastases seen on the recent PET and MRI were not evident.  He receives [place] monthly (last 09/20/2016).  He had marked leukocytosis with hypereosinophilia. Initial WBC was 72,300 with 53% eosinophils. Bone marrow on 03/29/2016 revealed marked increase in marrow eosinophils (approximate 30%). There was no diagnostic morphologic evidence of a myeloproliferative or lymphoid neoplasm. Flow cytometry revealed significant increase in eosinophils (54%). There was relative decreased myeloid cells with no significant immunophenotypic abnormalities or increase in blasts. There was no lymphoid  abnormalities or evidence of clonality. FISH studies were negative for myeloproliferative neoplasms with eosinophilia (PDGFRA, PDGFRB, FGFR1). BCR-ABL was negative on 03/28/2016.  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  Left lower extremity duplex on 03/25/2016 revealed no evidence of DVT. Discomfort improved on Neurontin 300 mg a day and continues to improve with declining eosinophilia.  He received 41.4 Gy from 04/11/2016 - 05/23/2016.  He received 4 weeks of concurrent carboplatin and Taxol (04/17/2016 - 05/22/2016).    He has received 7 cycles of Keytruda (07/03/2016 - 10/12/2016; 11/22/2016 - 12/12/2016).  He has tolerated treatment well.  He was diagnosed with aspiration pneumonia.  Chest CT on 06/16/2016 revealed multifocal airspace opacities within the right lung which had progressed since the prior CT, suspicious for postobstructive and/or aspiration pneumonia. There was a cavitary component in the right upper lobe posteriorly.  The dominant right upper lobe mass and the hilar adenopathy had improved.  He received 10 days of Levaquin.  He completed a course of Augmentin.  Chest, abdomen, and pelvic CT scan on 10/19/2016 revealed radiation changes in the right lung.  The superimposed right lung pneumonia had improved.  The 6.4 x 2.4 cm right perihilar mass had decreased.  There was improving thoracic lymphadenopathy.  There was progression of hepatic metastases, measuring up to 4.7 cm.  Specifically, there was a 3.9 x 3.7 cm metastasis in segment 8 (previously 2.5 x 2.8 cm) and a 4.7 x 4.0 cm metastasis in segment 4B (previously 3.1 x 2.6 cm).  PET scan on 11/14/2016 revealed 2 liver masses. Both were significantly more hypermetabolic than on 81/77/1165 and have enlarged compared to the prior PET-CT.  The segment 8 lesion was centrally necrotic but with high activity along its periphery (7.4), and about the same size as it was on  10/19/2016. The lateral segment left hepatic lobe lesion had enlarged (5.9 x 5.3 cm) compared to 10/19/16 (4.1 x 4.4 cm).  The bony metastatic lesions were no longer hypermetabolic and have resolved.  There  were post therapy and postoperative findings in the right lung, with a considerable amount of suspected radiation pneumonitis and radiation fibrosis.  Chest, abdomen, and pelvic CT scan on 12/29/2016 revealed mild reduction in size and central necrosis of the hepatic metastatic lesions.  There was continued post therapy related findings along the right hilar region. Dominant right perihilar mass was reduced in thickness compared to 10/19/2016.   Symptomatically, he is feeling good.  He is active.  He has a rare "twinge" in the RUQ.  Weight is slowly increasing.  Code status is DNR/DNI.  Plan: 1.  Labs today:  CBC with diff, CMP, Mg, TSH. 2.  Review CT scans.  Continued reduction in liver metastasis.  Discuss continuation of therapy. 3.  Cycle #8 Keytruda. 4.  Xgeva today. 5.  Discuss continuation of Xgeva every other cycle (q 6 weeks). 6.  RTC in 3 weeks for MD assessment, labs (CBC with diff, CMP, Mg), and cycle #9 Keytruda.   Lequita Asal, MD  01/02/2017, 9:26 AM

## 2017-01-02 NOTE — Progress Notes (Signed)
Patient continues to have pain in right ribcage.  Patient here for scan results.

## 2017-01-23 ENCOUNTER — Inpatient Hospital Stay (HOSPITAL_BASED_OUTPATIENT_CLINIC_OR_DEPARTMENT_OTHER): Payer: Medicare Other | Admitting: Hematology and Oncology

## 2017-01-23 ENCOUNTER — Inpatient Hospital Stay: Payer: Medicare Other | Attending: Hematology and Oncology

## 2017-01-23 ENCOUNTER — Inpatient Hospital Stay: Payer: Medicare Other

## 2017-01-23 VITALS — BP 109/68 | HR 80 | Temp 95.2°F | Resp 18 | Wt 135.6 lb

## 2017-01-23 DIAGNOSIS — C3411 Malignant neoplasm of upper lobe, right bronchus or lung: Secondary | ICD-10-CM | POA: Insufficient documentation

## 2017-01-23 DIAGNOSIS — Z66 Do not resuscitate: Secondary | ICD-10-CM

## 2017-01-23 DIAGNOSIS — D721 Eosinophilia: Secondary | ICD-10-CM

## 2017-01-23 DIAGNOSIS — R634 Abnormal weight loss: Secondary | ICD-10-CM

## 2017-01-23 DIAGNOSIS — Z8701 Personal history of pneumonia (recurrent): Secondary | ICD-10-CM | POA: Diagnosis not present

## 2017-01-23 DIAGNOSIS — M129 Arthropathy, unspecified: Secondary | ICD-10-CM | POA: Diagnosis not present

## 2017-01-23 DIAGNOSIS — Z809 Family history of malignant neoplasm, unspecified: Secondary | ICD-10-CM | POA: Insufficient documentation

## 2017-01-23 DIAGNOSIS — Z77090 Contact with and (suspected) exposure to asbestos: Secondary | ICD-10-CM

## 2017-01-23 DIAGNOSIS — Z808 Family history of malignant neoplasm of other organs or systems: Secondary | ICD-10-CM | POA: Diagnosis not present

## 2017-01-23 DIAGNOSIS — D72829 Elevated white blood cell count, unspecified: Secondary | ICD-10-CM | POA: Insufficient documentation

## 2017-01-23 DIAGNOSIS — Z87891 Personal history of nicotine dependence: Secondary | ICD-10-CM

## 2017-01-23 DIAGNOSIS — Z79899 Other long term (current) drug therapy: Secondary | ICD-10-CM | POA: Insufficient documentation

## 2017-01-23 DIAGNOSIS — I899 Noninfective disorder of lymphatic vessels and lymph nodes, unspecified: Secondary | ICD-10-CM | POA: Diagnosis not present

## 2017-01-23 DIAGNOSIS — Z801 Family history of malignant neoplasm of trachea, bronchus and lung: Secondary | ICD-10-CM | POA: Insufficient documentation

## 2017-01-23 DIAGNOSIS — Z87448 Personal history of other diseases of urinary system: Secondary | ICD-10-CM | POA: Insufficient documentation

## 2017-01-23 DIAGNOSIS — Z5112 Encounter for antineoplastic immunotherapy: Secondary | ICD-10-CM | POA: Diagnosis not present

## 2017-01-23 DIAGNOSIS — G629 Polyneuropathy, unspecified: Secondary | ICD-10-CM

## 2017-01-23 DIAGNOSIS — C787 Secondary malignant neoplasm of liver and intrahepatic bile duct: Secondary | ICD-10-CM

## 2017-01-23 DIAGNOSIS — C7951 Secondary malignant neoplasm of bone: Secondary | ICD-10-CM | POA: Insufficient documentation

## 2017-01-23 DIAGNOSIS — M069 Rheumatoid arthritis, unspecified: Secondary | ICD-10-CM | POA: Diagnosis not present

## 2017-01-23 LAB — COMPREHENSIVE METABOLIC PANEL
ALT: 43 U/L (ref 17–63)
AST: 56 U/L — ABNORMAL HIGH (ref 15–41)
Albumin: 3.9 g/dL (ref 3.5–5.0)
Alkaline Phosphatase: 74 U/L (ref 38–126)
Anion gap: 6 (ref 5–15)
BUN: 17 mg/dL (ref 6–20)
CO2: 28 mmol/L (ref 22–32)
Calcium: 9.2 mg/dL (ref 8.9–10.3)
Chloride: 101 mmol/L (ref 101–111)
Creatinine, Ser: 0.84 mg/dL (ref 0.61–1.24)
GFR calc Af Amer: >60 mL/min (ref >60)
GFR calc non Af Amer: >60 mL/min (ref >60)
Glucose, Bld: 126 mg/dL — ABNORMAL HIGH (ref 65–99)
Potassium: 4.1 mmol/L (ref 3.5–5.1)
Sodium: 135 mmol/L (ref 135–145)
Total Bilirubin: 0.7 mg/dL (ref 0.3–1.2)
Total Protein: 8.3 g/dL — ABNORMAL HIGH (ref 6.5–8.1)

## 2017-01-23 LAB — CBC WITH DIFFERENTIAL/PLATELET
Basophils Absolute: 0.2 10*3/uL — ABNORMAL HIGH (ref 0–0.1)
Basophils Relative: 3 %
Eosinophils Absolute: 0.5 10*3/uL (ref 0–0.7)
Eosinophils Relative: 7 %
HCT: 38.3 % — ABNORMAL LOW (ref 40.0–52.0)
Hemoglobin: 13.3 g/dL (ref 13.0–18.0)
Lymphocytes Relative: 24 %
Lymphs Abs: 1.6 10*3/uL (ref 1.0–3.6)
MCH: 33.3 pg (ref 26.0–34.0)
MCHC: 34.8 g/dL (ref 32.0–36.0)
MCV: 95.9 fL (ref 80.0–100.0)
Monocytes Absolute: 0.9 10*3/uL (ref 0.2–1.0)
Monocytes Relative: 14 %
Neutro Abs: 3.5 10*3/uL (ref 1.4–6.5)
Neutrophils Relative %: 52 %
Platelets: 202 10*3/uL (ref 150–440)
RBC: 4 MIL/uL — ABNORMAL LOW (ref 4.40–5.90)
RDW: 13.3 % (ref 11.5–14.5)
WBC: 6.7 10*3/uL (ref 3.8–10.6)

## 2017-01-23 LAB — TSH: TSH: 3.031 u[IU]/mL (ref 0.350–4.500)

## 2017-01-23 LAB — T4, FREE: Free T4: 0.74 ng/dL (ref 0.61–1.12)

## 2017-01-23 LAB — MAGNESIUM: Magnesium: 1.9 mg/dL (ref 1.7–2.4)

## 2017-01-23 MED ORDER — SODIUM CHLORIDE 0.9 % IV SOLN
200.0000 mg | Freq: Once | INTRAVENOUS | Status: AC
  Administered 2017-01-23: 200 mg via INTRAVENOUS
  Filled 2017-01-23: qty 8

## 2017-01-23 MED ORDER — SODIUM CHLORIDE 0.9 % IV SOLN
Freq: Once | INTRAVENOUS | Status: AC
  Administered 2017-01-23: 11:00:00 via INTRAVENOUS
  Filled 2017-01-23: qty 1000

## 2017-01-23 NOTE — Progress Notes (Signed)
[facility] day:  01/23/2017   Chief Complaint: Gerald [name] is a 66 y.o. male with metastatic lung cancer and associated hypereosinophilia who is seen for assessment prior to cycle #9 Keytruda.  HPI:  The patient was last seen in the medical oncology clinic on 01/02/2017.  At that time, he was doing well.  He denied any complaints except for an intermittent "twinge" in the right upper quadrant.  He received [place]. TSH was normal, but rising.  During the interim, he has been feeling well. He has been taking his folic acid, multivitamin and calcium which he feels have been "giving him more energy and making him feel better". He also states that his lungs are sensitive to temperature changes and he notices an increase in cough and congestion with the colder temperature. He denies any fevers.    Past Medical History:  Diagnosis Date  . Arthritis    Rheumatoid arthritis  . Cancer ([place])    lung ca   . Collagen vascular disease ([facility])    RA  . Mass of lung   . Pneumothorax    spontaneous  . Renal disorder    as a child    Past Surgical History:  Procedure Laterality Date  . FLEXIBLE BRONCHOSCOPY N/A 03/30/2016   Procedure: FLEXIBLE BRONCHOSCOPY;  Surgeon: Vilinda Boehringer, MD;  Location: [facility] ORS;  Service: Cardiopulmonary;  Laterality: N/A;  . Ureter repair as a child      Family History  Problem Relation Age of Onset  . Brain cancer Mother   . Lung cancer Maternal Uncle   . Cancer Sister     Metastatic cancer- unknown primary    Social History:  reports that he quit smoking about 16 years ago. His smoking use included Cigarettes. He has a 13.00 pack-year smoking history. He has never used smokeless tobacco. He reports that he drinks alcohol. He reports that he does not use drugs.  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  He will be getting a refrigerator soon.  He lives in  [place].  Contact number is [phone].  The patient is alone today.  Allergies:  Allergies  Allergen Reactions  . Nsaids Other (See Comments)    Pt is unable to take this class of medications.    . Sulfa Antibiotics Other (See Comments)    Reaction:  GI upset     Current Medications: Current Outpatient Prescriptions  Medication Sig Dispense Refill  . folic acid (FOLVITE) 1 MG tablet Take 1 tablet (1 mg total) by mouth daily. 30 tablet 3  . gabapentin (NEURONTIN) 300 MG capsule Take 1 capsule (300 mg total) by mouth at bedtime. 30 capsule 2  . HYDROcodone-acetaminophen (NORCO) 7.5-325 MG tablet Take 1 tablet by mouth every 4 (four) hours as needed for moderate pain (Every 4-6 hours as needed for pain). 30 tablet 0  . oxyCODONE (OXY IR/ROXICODONE) 5 MG immediate release tablet Take 1 tablet (5 mg total) by mouth every 4 (four) hours as needed for severe pain. 30 tablet 0  . oxyCODONE-acetaminophen (PERCOCET/ROXICET) 5-325 MG tablet Take 1 tablet by mouth every 6 (six) hours as needed for severe pain. 30 tablet 0  . sucralfate (CARAFATE) 1 g tablet Take 1 tablet (1 g total) by mouth 3 (three) times daily. Dissolve in 2-3 tbsp warm water, swish and swallow. 90 tablet 3  . calcium-vitamin D (OSCAL  WITH D) 500-200 MG-UNIT tablet Take 1 tablet by mouth 2 (two) times daily. 180 tablet 0  . ondansetron (ZOFRAN) 8 MG tablet Take 1 tablet (8 mg total) by mouth 2 (two) times daily as needed for nausea or vomiting. (Patient not taking: Reported on 12/12/2016) 20 tablet 0   No current facility-administered medications for this visit.    Facility-Administered Medications Ordered in Other Visits  Medication Dose Route Frequency Provider Last Rate Last Dose  . 0.9 %  sodium chloride infusion   Intravenous Once Lequita Asal, MD      . pembrolizumab [facility]) 200 mg in sodium chloride 0.9 % 50 mL chemo infusion  200 mg Intravenous Once Lequita Asal, MD        Review of Systems:   GENERAL:  Feels "well". No fever, chills or sweats.  Weight up 1 pound. PERFORMANCE STATUS (ECOG):  2 HEENT: No visual changes, sore throat, mouth sores or tenderness. Lungs:  Little congestion and cough with colder temperature.  No shortness of breath. No hemoptysis. Cardiac:  No chest pain, palpitations, orthopnea, or PND.  Sleeps in a recliner. GI:  Appetite good.  No nausea, vomiting, diarrhea, constipation, melena or hematochezia. GU:  No urgency, frequency, dysuria, or hematuria. Musculoskeletal:  Chronic right knee pain. No back pain.  No muscle tenderness. Extremities:  No pain or swelling. Skin:  No rashes or skin changes. 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  No headache, focal weakness, balance or coordination issues. Endocrine:  No diabetes, thyroid issues, hot flashes or night sweats. Psych:  No mood changes, depression or anxiety. Pain:  No pain.  Review of systems:  All other systems reviewed and found to be negative.  Physical Exam: Blood pressure 109/68, pulse 80, temperature (!) 95.2 F (35.1 C), temperature source Tympanic, resp. rate 18, weight 135 lb 9.3 oz (61.5 kg). GENERAL:  Thin gentleman sitting comfortably in the exam room in no acute distress. MENTAL STATUS:  Alert and oriented to person, place and time.  HEAD:Wearing a camo cap. Long gray hair in pony tail. [name]. Temporal wasting. Facial irritation.  Normocephalic, atraumatic, face symmetric, no Cushingoid features. EYES:Blue eyes. Pupils equal round and reactive to light and accomodation. No conjunctivitis or scleral icterus. WUG:[id number] clear without lesion. Tonguenormal. Mucous membranes moist. RESPIRATORY:Clear to auscultationwithout rales, wheezes or rhonchi. CARDIOVASCULAR:Regular rate andrhythmwithout murmur, rub or gallop. ABDOMEN:Soft, non-tender, with active bowel sounds and no hepatosplenomegaly. No masses. SKIN:  No rashes, ulcers or lesions. EXTREMITIES: No edema, no skin discoloration or tenderness. No palpable cords. LYMPHNODES: No palpable cervical, supraclavicular, axillary or inguinal adenopathy  NEUROLOGICAL: Unremarkable. PSYCH: Appropriate.    Imaging studies:  03/27/2016:  Chest CT angiogram revealed an 8 cm mass in the medial right upper lobe. The mass abutted the mediastinum and possibly invaded the left atrium. There was associated widespread thoracic adenopathy. There was a 2.6 cm enhancing lesion in the medial segment of the left hepatic lobe. 04/05/2016:  PET scan revealed intense hypermetabolism (SUV 17.95) associated with a 7.5 cm central perihilar right lung lesion. There was mediastinal (sub-carinal node SUV 8.89; 2.6 cm right paratracheal node SUV 17.49) and upper abdominal retroperitoneal hypermetabolic adenopathy (9 mm aortocaval node SUV 9.2). There were 2 liver metastasis (2.3 cm left lobe SUV 6.19; 2.2 cm segment VIII SUV 5.18). There are multifocal bone metastasis (C2 vertebral body SUV 9.0; 1.6 cm left sacral ala lesion SUV of 8.6).  03/31/2016:  Head MRI was indeterminate for early metastatic disease to the brain. There was a small focus of gyral enhancement in the anterior right frontal lobe likely is post ischemic, while a small posterior right cerebellar enhancing lesion was more suspicious for small brain metastasis. There were multiple small primarily subacute appearing infarcts in the bilateral MCA and left PICA territories.  04/07/2016:  Bone scan revealed a subtle distal right femur lesion which might be a small metastasis.  Bone scan was felt to be an unreliable modality for evaluation of osseous metastatic disease as the bone metastases seen on the recent PET and MRI were not evident. 05/12/2016:  Head MRI revealed expected interval evolution of bilateral cerebral in cerebellar infarcts. There was resolution of right frontal and right cerebellar enhancement  consistent with subacute infarcts. There was a new 3 mm focus of gyral enhancement in the left occipital lobe and a possible new 4 mm enhancing lesion in the left cerebellum (? vascular enhancement versus tiny metastasis).  08/16/2016:  Head MRI revealed multiple areas of improving enhancement and restricted diffusion compatible with resolving subacute infarcts.  There was no evidence of metastatic disease or acute infarction  10/19/2016:  Chest, abdomen, and pelvic CT scan revealed radiation changes in the right lung.  The superimposed right lung pneumonia had improved.  The 6.4 x 2.4 cm right perihilar mass had decreased.  There was improving thoracic lymphadenopathy.  There was progression of hepatic metastases, measuring up to 4.7 cm.  Specifically, there was a 3.9 x 3.7 cm metastasis in segment 8 (previously 2.5 x 2.8 cm) and a 4.7 x 4.0 cm metastasis in segment 4B (previously 3.1 x 2.6 cm). 11/14/2016:  PET scan revealed 2 liver masses. Both were significantly more hypermetabolic than on 50/53/9767 and have enlarged compared to the prior PET-CT.  The segment 8 lesion was centrally necrotic but with high activity along its periphery (7.4), and about the same size as it was on 10/19/2016. The lateral segment left hepatic lobe lesion had enlarged (5.9 x 5.3 cm) compared to 10/19/16 (4.1 x 4.4 cm).  The bony metastatic lesions were no longer hypermetabolic and have resolved.  There were post therapy and postoperative findings in the right lung, with a considerable amount of suspected radiation pneumonitis and radiation fibrosis. 12/29/2016:  Chest, abdomen, and pelvic CT revealed mild reduction in size and central necrosis of the hepatic metastatic lesions.  There was continued post therapy related findings along the right hilar region. Dominant right perihilar mass was reduced in thickness compared to 10/19/2016.    Appointment on 01/23/2017  Component Date Value Ref Range Status  . WBC 01/23/2017 6.7   3.8 - 10.6 K/uL Final  . RBC 01/23/2017 4.00* 4.40 - 5.90 MIL/uL Final  . Hemoglobin 01/23/2017 13.3  13.0 - 18.0 g/dL Final  . HCT 01/23/2017 38.3* 40.0 - 52.0 % Final  . MCV 01/23/2017 95.9  80.0 - 100.0 fL Final  . MCH 01/23/2017 33.3  26.0 - 34.0 pg Final  . MCHC 01/23/2017 34.8  32.0 - 36.0 g/dL Final  . RDW 01/23/2017 13.3  11.5 - 14.5 % Final  . Platelets 01/23/2017 202  150 - 440 K/uL Final  . Neutrophils Relative % 01/23/2017 52  % Final  . Neutro Abs 01/23/2017 3.5  1.4 - 6.5 K/uL Final  . Lymphocytes Relative 01/23/2017 24  % Final  . Lymphs Abs 01/23/2017 1.6  1.0 - 3.6 K/uL Final  . Monocytes Relative 01/23/2017 14  % Final  .  Monocytes Absolute 01/23/2017 0.9  0.2 - 1.0 K/uL Final  . Eosinophils Relative 01/23/2017 7  % Final  . Eosinophils Absolute 01/23/2017 0.5  0 - 0.7 K/uL Final  . Basophils Relative 01/23/2017 3  % Final  . Basophils Absolute 01/23/2017 0.2* 0 - 0.1 K/uL Final  . Sodium 01/23/2017 135  135 - 145 mmol/L Final  . Potassium 01/23/2017 4.1  3.5 - 5.1 mmol/L Final  . Chloride 01/23/2017 101  101 - 111 mmol/L Final  . CO2 01/23/2017 28  22 - 32 mmol/L Final  . Glucose, Bld 01/23/2017 126* 65 - 99 mg/dL Final  . BUN 01/23/2017 17  6 - 20 mg/dL Final  . Creatinine, Ser 01/23/2017 0.84  0.61 - 1.24 mg/dL Final  . Calcium 01/23/2017 9.2  8.9 - 10.3 mg/dL Final  . Total Protein 01/23/2017 8.3* 6.5 - 8.1 g/dL Final  . Albumin 01/23/2017 3.9  3.5 - 5.0 g/dL Final  . AST 01/23/2017 56* 15 - 41 U/L Final  . ALT 01/23/2017 43  17 - 63 U/L Final  . Alkaline Phosphatase 01/23/2017 74  38 - 126 U/L Final  . Total Bilirubin 01/23/2017 0.7  0.3 - 1.2 mg/dL Final  . GFR calc non Af Amer 01/23/2017 >60  >60 mL/min Final  . GFR calc Af Amer 01/23/2017 >60  >60 mL/min Final   Comment: (NOTE) The eGFR has been calculated using the CKD EPI equation. This calculation has not been validated in all clinical situations. eGFR's persistently <60 mL/min signify possible  Chronic Kidney Disease.   . Anion gap 01/23/2017 6  5 - 15 Final  . Magnesium 01/23/2017 1.9  1.7 - 2.4 mg/dL Final  . TSH 01/23/2017 3.031  0.350 - 4.500 uIU/mL Final    Assessment:  Gerald [name] is a 66 y.o. male with stage IV adenocarcinoma of the lung and associated leukocytosis with eosinophilia. He presented with a 30 pound weight loss, shortness of breath, cough, and left leg discomfort. He has a 13-15 pack year smoking history.  Bronchoscopy on 03/30/2016 revealed an endobronchial lesion in the RUL anterior segment with 95% occlusion. There was extrinsic compression in the right middle lobe secondary to posterior mass effect. Pathology confirmed non-small cell lung cancer, favor adenocarcinoma.  PD-L1 testing revealed high expression (> 50%).  EGFR, ALK, and ROS1 were negative.  CEA was 2.3 on 03/27/2016.  PET scan on 04/05/2016 revealed intense hypermetabolism (SUV 17.95) associated with a 7.5 cm central perihilar right lung lesion. There was mediastinal (sub-carinal node SUV 8.89; 2.6 cm right paratracheal node SUV 17.49) and upper abdominal retroperitoneal hypermetabolic adenopathy (9 mm aortocaval node SUV 9.2). There were 2 liver metastasis (2.3 cm left lobe SUV 6.19; 2.2 cm segment VIII SUV 5.18). There are multifocal bone metastasis (C2 vertebral body SUV 9.0; 1.6 cm left sacral ala lesion SUV of 8.6).   He had marked leukocytosis with hypereosinophilia. Initial WBC was 72,300 with 53% eosinophils. Bone marrow on 03/29/2016 revealed marked increase in marrow eosinophils (approximate 30%). There was no diagnostic morphologic evidence of a myeloproliferative or lymphoid neoplasm. Flow cytometry revealed significant increase in eosinophils (54%). There was relative decreased myeloid cells with no significant immunophenotypic abnormalities or increase in blasts. There was no lymphoid abnormalities or evidence of clonality. FISH studies were negative for myeloproliferative neoplasms  with eosinophilia (PDGFRA, PDGFRB, FGFR1). BCR-ABL was negative on 03/28/2016.  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  Left lower extremity duplex  on 03/25/2016 revealed no evidence of DVT. Discomfort improved on Neurontin 300 mg a day and continues to improve with declining eosinophilia.  He received 41.4 Gy from 04/11/2016 - 05/23/2016.  He received 4 weeks of concurrent carboplatin and Taxol (04/17/2016 - 05/22/2016).    He has received 8 cycles of Keytruda (07/03/2016 - 10/12/2016; 11/22/2016 - 01/02/2017).  He has tolerated treatment well.  He was diagnosed with aspiration pneumonia on 06/16/2016.  He received 10 days of Levaquin.  He completed a course of Augmentin.  He receives [place] every 6 weeks (began 05/22/2016; last 01/02/2017)  PET scan on 11/14/2016 revealed 2 liver masses. Both were significantly more hypermetabolic than on 34/28/7681 and have enlarged compared to the prior PET-CT.  The segment 8 lesion was centrally necrotic but with high activity along its periphery (7.4), and about the same size as it was on 10/19/2016. The lateral segment left hepatic lobe lesion had enlarged (5.9 x 5.3 cm) compared to 10/19/16 (4.1 x 4.4 cm).  The bony metastatic lesions were no longer hypermetabolic and have resolved.  There were post therapy and postoperative findings in the right lung, with a considerable amount of suspected radiation pneumonitis and radiation fibrosis.  Chest, abdomen, and pelvic CT on 12/29/2016 revealed mild reduction in size and central necrosis of the hepatic metastatic lesions.  There was continued post therapy related findings along the right hilar region. Dominant right perihilar mass was reduced in thickness compared to 10/19/2016.   Symptomatically, he feels well.  He is active and gaining weight. Code status is DNR/DNI.  Plan: 1.  Labs today:  CBC with diff, CMP, Mg, TSH, free T4. 2.  Cycle #9  Keytruda today. 3.  Continue Xgeva every 6 weeks (alternating cycles). 4.  RTC in 3 weeks for MD assessment, labs (CBC with diff, CMP, Mg), Xgeva, and cycle #10 Keytruda.  The patient was seen and examined.  The assessment and plan was discussed with the patient.  Several questions were asked and answered.    Faythe Casa, NP  Lequita Asal, MD  01/23/2017, 10:53 AM

## 2017-01-23 NOTE — Progress Notes (Signed)
Patient offers no complaints today. 

## 2017-02-06 ENCOUNTER — Encounter: Payer: Self-pay | Admitting: Hematology and Oncology

## 2017-02-12 ENCOUNTER — Other Ambulatory Visit: Payer: Self-pay | Admitting: Oncology

## 2017-02-13 ENCOUNTER — Other Ambulatory Visit: Payer: Self-pay | Admitting: Hematology and Oncology

## 2017-02-13 ENCOUNTER — Inpatient Hospital Stay: Payer: Medicare Other

## 2017-02-13 ENCOUNTER — Inpatient Hospital Stay: Payer: Medicare Other | Attending: Hematology and Oncology | Admitting: Oncology

## 2017-02-13 VITALS — BP 112/72 | HR 86 | Temp 96.9°F | Resp 18 | Wt 137.3 lb

## 2017-02-13 DIAGNOSIS — Z808 Family history of malignant neoplasm of other organs or systems: Secondary | ICD-10-CM

## 2017-02-13 DIAGNOSIS — I998 Other disorder of circulatory system: Secondary | ICD-10-CM

## 2017-02-13 DIAGNOSIS — Z87891 Personal history of nicotine dependence: Secondary | ICD-10-CM | POA: Diagnosis not present

## 2017-02-13 DIAGNOSIS — Z8701 Personal history of pneumonia (recurrent): Secondary | ICD-10-CM | POA: Diagnosis not present

## 2017-02-13 DIAGNOSIS — C787 Secondary malignant neoplasm of liver and intrahepatic bile duct: Secondary | ICD-10-CM

## 2017-02-13 DIAGNOSIS — Z801 Family history of malignant neoplasm of trachea, bronchus and lung: Secondary | ICD-10-CM | POA: Diagnosis not present

## 2017-02-13 DIAGNOSIS — Z79899 Other long term (current) drug therapy: Secondary | ICD-10-CM | POA: Diagnosis not present

## 2017-02-13 DIAGNOSIS — C7951 Secondary malignant neoplasm of bone: Secondary | ICD-10-CM | POA: Diagnosis not present

## 2017-02-13 DIAGNOSIS — Z87448 Personal history of other diseases of urinary system: Secondary | ICD-10-CM | POA: Insufficient documentation

## 2017-02-13 DIAGNOSIS — R590 Localized enlarged lymph nodes: Secondary | ICD-10-CM

## 2017-02-13 DIAGNOSIS — M069 Rheumatoid arthritis, unspecified: Secondary | ICD-10-CM | POA: Diagnosis not present

## 2017-02-13 DIAGNOSIS — Z923 Personal history of irradiation: Secondary | ICD-10-CM | POA: Diagnosis not present

## 2017-02-13 DIAGNOSIS — Z66 Do not resuscitate: Secondary | ICD-10-CM | POA: Diagnosis not present

## 2017-02-13 DIAGNOSIS — C3411 Malignant neoplasm of upper lobe, right bronchus or lung: Secondary | ICD-10-CM | POA: Diagnosis not present

## 2017-02-13 DIAGNOSIS — D721 Eosinophilia: Secondary | ICD-10-CM

## 2017-02-13 DIAGNOSIS — Z5112 Encounter for antineoplastic immunotherapy: Secondary | ICD-10-CM | POA: Diagnosis not present

## 2017-02-13 LAB — CBC WITH DIFFERENTIAL/PLATELET
Basophils Absolute: 0.1 10*3/uL (ref 0–0.1)
Basophils Relative: 1 %
Eosinophils Absolute: 0.4 10*3/uL (ref 0–0.7)
Eosinophils Relative: 5 %
HCT: 42.8 % (ref 40.0–52.0)
Hemoglobin: 14.8 g/dL (ref 13.0–18.0)
Lymphocytes Relative: 24 %
Lymphs Abs: 1.7 10*3/uL (ref 1.0–3.6)
MCH: 33.1 pg (ref 26.0–34.0)
MCHC: 34.5 g/dL (ref 32.0–36.0)
MCV: 96 fL (ref 80.0–100.0)
Monocytes Absolute: 1.1 10*3/uL — ABNORMAL HIGH (ref 0.2–1.0)
Monocytes Relative: 15 %
Neutro Abs: 3.9 10*3/uL (ref 1.4–6.5)
Neutrophils Relative %: 55 %
Platelets: 229 10*3/uL (ref 150–440)
RBC: 4.46 MIL/uL (ref 4.40–5.90)
RDW: 13.5 % (ref 11.5–14.5)
WBC: 7.3 10*3/uL (ref 3.8–10.6)

## 2017-02-13 LAB — COMPREHENSIVE METABOLIC PANEL
ALT: 50 U/L (ref 17–63)
AST: 63 U/L — ABNORMAL HIGH (ref 15–41)
Albumin: 4.3 g/dL (ref 3.5–5.0)
Alkaline Phosphatase: 92 U/L (ref 38–126)
Anion gap: 7 (ref 5–15)
BUN: 24 mg/dL — ABNORMAL HIGH (ref 6–20)
CO2: 28 mmol/L (ref 22–32)
Calcium: 9.9 mg/dL (ref 8.9–10.3)
Chloride: 100 mmol/L — ABNORMAL LOW (ref 101–111)
Creatinine, Ser: 0.84 mg/dL (ref 0.61–1.24)
GFR calc Af Amer: >60 mL/min (ref >60)
GFR calc non Af Amer: >60 mL/min (ref >60)
Glucose, Bld: 81 mg/dL (ref 65–99)
Potassium: 4.1 mmol/L (ref 3.5–5.1)
Sodium: 135 mmol/L (ref 135–145)
Total Bilirubin: 0.6 mg/dL (ref 0.3–1.2)
Total Protein: 9 g/dL — ABNORMAL HIGH (ref 6.5–8.1)

## 2017-02-13 LAB — TSH: TSH: 3.189 u[IU]/mL (ref 0.350–4.500)

## 2017-02-13 MED ORDER — DENOSUMAB 120 MG/1.7ML ~~LOC~~ SOLN
120.0000 mg | Freq: Once | SUBCUTANEOUS | Status: AC
  Administered 2017-02-13: 120 mg via SUBCUTANEOUS
  Filled 2017-02-13: qty 1.7

## 2017-02-13 MED ORDER — SODIUM CHLORIDE 0.9 % IV SOLN
Freq: Once | INTRAVENOUS | Status: AC
  Administered 2017-02-13: 14:00:00 via INTRAVENOUS
  Filled 2017-02-13: qty 1000

## 2017-02-13 MED ORDER — SODIUM CHLORIDE 0.9 % IV SOLN
200.0000 mg | Freq: Once | INTRAVENOUS | Status: AC
  Administered 2017-02-13: 200 mg via INTRAVENOUS
  Filled 2017-02-13: qty 8

## 2017-02-13 MED ORDER — OXYCODONE-ACETAMINOPHEN 5-325 MG PO TABS: 1.0000 | ORAL_TABLET | Freq: Four times a day (QID) | ORAL | 0 refills | Status: DC | PRN

## 2017-02-13 NOTE — Progress Notes (Signed)
Patient offers no complaints today.  Is requesting refill for Percocet 5-325 mg.

## 2017-02-13 NOTE — Progress Notes (Signed)
[facility] day:  02/17/2017   Chief Complaint: Gerald [name] is a 66 y.o. male with metastatic lung cancer and associated hypereosinophilia who is seen for assessment prior to cycle #10 Keytruda.  HPI:  Patient currently feels well and is asymptomatic. His pain is well-controlled on his current narcotic regimen and he is requesting a refill his Percocet. He has no neurologic complaints. He denies any recent fevers or illnesses. He has a fair appetite, but denies weight loss. He has no chest pain or shortness of breath. He denies any nausea, vomiting, constipation, or diarrhea. He has no urinary complaints. Patient offers no further specific complaints today.    Past Medical History:  Diagnosis Date  . Arthritis    Rheumatoid arthritis  . Cancer ([facility])    lung ca   . Collagen vascular disease ([facility])    RA  . Mass of lung   . Pneumothorax    spontaneous  . Renal disorder    as a child    Past Surgical History:  Procedure Laterality Date  . FLEXIBLE BRONCHOSCOPY N/A 03/30/2016   Procedure: FLEXIBLE BRONCHOSCOPY;  Surgeon: Vilinda Boehringer, MD;  Location: [facility] ORS;  Service: Cardiopulmonary;  Laterality: N/A;  . Ureter repair as a child      Family History  Problem Relation Age of Onset  . Brain cancer Mother   . Lung cancer Maternal Uncle   . Cancer Sister     Metastatic cancer- unknown primary    Social History:  reports that he quit smoking about 16 years ago. His smoking use included Cigarettes. He has a 13.00 pack-year smoking history. He has never used smokeless tobacco. He reports that he drinks alcohol. He reports that he does not use drugs.  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  He will be getting a refrigerator soon.  He lives in [place].  Contact number is [phone].  The patient is alone today.  Allergies:  Allergies  Allergen Reactions  . Nsaids Other (See  Comments)    Pt is unable to take this class of medications.    . Sulfa Antibiotics Other (See Comments)    Reaction:  GI upset     Current Medications: Current Outpatient Prescriptions  Medication Sig Dispense Refill  . folic acid (FOLVITE) 1 MG tablet Take 1 tablet (1 mg total) by mouth daily. 30 tablet 3  . gabapentin (NEURONTIN) 300 MG capsule Take 1 capsule (300 mg total) by mouth at bedtime. 30 capsule 2  . HYDROcodone-acetaminophen (NORCO) 7.5-325 MG tablet Take 1 tablet by mouth every 4 (four) hours as needed for moderate pain (Every 4-6 hours as needed for pain). 30 tablet 0  . oxyCODONE (OXY IR/ROXICODONE) 5 MG immediate release tablet Take 1 tablet (5 mg total) by mouth every 4 (four) hours as needed for severe pain. 30 tablet 0  . oxyCODONE-acetaminophen (PERCOCET/ROXICET) 5-325 MG tablet Take 1 tablet by mouth every 6 (six) hours as needed for severe pain. 30 tablet 0  . sucralfate (CARAFATE) 1 g tablet Take 1 tablet (1 g total) by mouth 3 (three) times daily. Dissolve in 2-3 tbsp warm water, swish and swallow. 90 tablet 3  . calcium-vitamin D (OSCAL WITH D) 500-200 MG-UNIT tablet Take 1 tablet by mouth 2 (two) times daily. 180 tablet 0  . ondansetron (ZOFRAN) 8 MG tablet Take 1 tablet (8 mg total) by mouth 2 (  two) times daily as needed for nausea or vomiting. (Patient not taking: Reported on 12/12/2016) 20 tablet 0   No current facility-administered medications for this visit.     Review of Systems:  GENERAL:  Feels "well". No fever, chills or sweats.  PERFORMANCE STATUS (ECOG):  2 HEENT: No visual changes, sore throat, mouth sores or tenderness. Lungs:  Little congestion and cough with colder temperature.  No shortness of breath. No hemoptysis. Cardiac:  No chest pain, palpitations, orthopnea, or PND.  Sleeps in a recliner. GI:  Appetite good.  No nausea, vomiting, diarrhea, constipation, melena or hematochezia. GU:  No urgency, frequency, dysuria, or  hematuria. Musculoskeletal:  Chronic right knee pain. No back pain.  No muscle tenderness. Extremities:  No pain or swelling. Skin:  No rashes or skin changes. 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  No headache, focal weakness, balance or coordination issues. Endocrine:  No diabetes, thyroid issues, hot flashes or night sweats. Psych:  No mood changes, depression or anxiety. Pain:  No pain.  Review of systems:  All other systems reviewed and found to be negative.  Physical Exam: Blood pressure 112/72, pulse 86, temperature (!) 96.9 F (36.1 C), temperature source Tympanic, resp. rate 18, weight 137 lb 5.6 oz (62.3 kg). GENERAL:  Thin gentleman sitting comfortably in the exam room in no acute distress. MENTAL STATUS:  Alert and oriented to person, place and time.  HEAD:Wearing a camo cap. Long gray hair in pony tail. Gerald [name]. Temporal wasting. Facial irritation.  Normocephalic, atraumatic, face symmetric, no Cushingoid features. EYES:Blue eyes. Pupils equal round and reactive to light and accomodation. No conjunctivitis or scleral icterus. EUM:PNTIRWERXV clear without lesion. Tonguenormal. Mucous membranes moist. RESPIRATORY:Clear to auscultationwithout rales, wheezes or rhonchi. CARDIOVASCULAR:Regular rate andrhythmwithout murmur, rub or gallop. ABDOMEN:Soft, non-tender, with active bowel sounds and no hepatosplenomegaly. No masses. SKIN: No rashes, ulcers or lesions. EXTREMITIES: No edema, no skin discoloration or tenderness. No palpable cords. LYMPHNODES: No palpable cervical, supraclavicular, axillary or inguinal adenopathy  NEUROLOGICAL: Unremarkable. PSYCH: Appropriate.    Imaging studies:  03/27/2016:  Chest CT angiogram revealed an 8 cm mass in the medial right upper lobe. The mass abutted the mediastinum and possibly invaded the left atrium. There was associated widespread thoracic adenopathy.  There was a 2.6 cm enhancing lesion in the medial segment of the left hepatic lobe. 04/05/2016:  PET scan revealed intense hypermetabolism (SUV 17.95) associated with a 7.5 cm central perihilar right lung lesion. There was mediastinal (sub-carinal node SUV 8.89; 2.6 cm right paratracheal node SUV 17.49) and upper abdominal retroperitoneal hypermetabolic adenopathy (9 mm aortocaval node SUV 9.2). There were 2 liver metastasis (2.3 cm left lobe SUV 6.19; 2.2 cm segment VIII SUV 5.18). There are multifocal bone metastasis (C2 vertebral body SUV 9.0; 1.6 cm left sacral ala lesion SUV of 8.6).  03/31/2016:  Head MRI was indeterminate for early metastatic disease to the brain. There was a small focus of gyral enhancement in the anterior right frontal lobe likely is post ischemic, while a small posterior right cerebellar enhancing lesion was more suspicious for small brain metastasis. There were multiple small primarily subacute appearing infarcts in the bilateral MCA and left PICA territories.  04/07/2016:  Bone scan revealed a subtle distal right femur lesion which might be a small metastasis.  Bone scan was felt to be an unreliable modality for evaluation of osseous metastatic disease as the bone metastases seen on the recent PET  and MRI were not evident. 05/12/2016:  Head MRI revealed expected interval evolution of bilateral cerebral in cerebellar infarcts. There was resolution of right frontal and right cerebellar enhancement consistent with subacute infarcts. There was a new 3 mm focus of gyral enhancement in the left occipital lobe and a possible new 4 mm enhancing lesion in the left cerebellum (? vascular enhancement versus tiny metastasis).  08/16/2016:  Head MRI revealed multiple areas of improving enhancement and restricted diffusion compatible with resolving subacute infarcts.  There was no evidence of metastatic disease or acute infarction  10/19/2016:  Chest, abdomen, and pelvic CT scan revealed  radiation changes in the right lung.  The superimposed right lung pneumonia had improved.  The 6.4 x 2.4 cm right perihilar mass had decreased.  There was improving thoracic lymphadenopathy.  There was progression of hepatic metastases, measuring up to 4.7 cm.  Specifically, there was a 3.9 x 3.7 cm metastasis in segment 8 (previously 2.5 x 2.8 cm) and a 4.7 x 4.0 cm metastasis in segment 4B (previously 3.1 x 2.6 cm). 11/14/2016:  PET scan revealed 2 liver masses. Both were significantly more hypermetabolic than on 62/70/3500 and have enlarged compared to the prior PET-CT.  The segment 8 lesion was centrally necrotic but with high activity along its periphery (7.4), and about the same size as it was on 10/19/2016. The lateral segment left hepatic lobe lesion had enlarged (5.9 x 5.3 cm) compared to 10/19/16 (4.1 x 4.4 cm).  The bony metastatic lesions were no longer hypermetabolic and have resolved.  There were post therapy and postoperative findings in the right lung, with a considerable amount of suspected radiation pneumonitis and radiation fibrosis. 12/29/2016:  Chest, abdomen, and pelvic CT revealed mild reduction in size and central necrosis of the hepatic metastatic lesions.  There was continued post therapy related findings along the right hilar region. Dominant right perihilar mass was reduced in thickness compared to 10/19/2016.    Appointment on 02/13/2017  Component Date Value Ref Range Status  . WBC 02/13/2017 7.3  3.8 - 10.6 K/uL Final  . RBC 02/13/2017 4.46  4.40 - 5.90 MIL/uL Final  . Hemoglobin 02/13/2017 14.8  13.0 - 18.0 g/dL Final  . HCT 02/13/2017 42.8  40.0 - 52.0 % Final  . MCV 02/13/2017 96.0  80.0 - 100.0 fL Final  . MCH 02/13/2017 33.1  26.0 - 34.0 pg Final  . MCHC 02/13/2017 34.5  32.0 - 36.0 g/dL Final  . RDW 02/13/2017 13.5  11.5 - 14.5 % Final  . Platelets 02/13/2017 229  150 - 440 K/uL Final  . Neutrophils Relative % 02/13/2017 55  % Final  . Neutro Abs 02/13/2017 3.9   1.4 - 6.5 K/uL Final  . Lymphocytes Relative 02/13/2017 24  % Final  . Lymphs Abs 02/13/2017 1.7  1.0 - 3.6 K/uL Final  . Monocytes Relative 02/13/2017 15  % Final  . Monocytes Absolute 02/13/2017 1.1* 0.2 - 1.0 K/uL Final  . Eosinophils Relative 02/13/2017 5  % Final  . Eosinophils Absolute 02/13/2017 0.4  0 - 0.7 K/uL Final  . Basophils Relative 02/13/2017 1  % Final  . Basophils Absolute 02/13/2017 0.1  0 - 0.1 K/uL Final  . Sodium 02/13/2017 135  135 - 145 mmol/L Final  . Potassium 02/13/2017 4.1  3.5 - 5.1 mmol/L Final  . Chloride 02/13/2017 100* 101 - 111 mmol/L Final  . CO2 02/13/2017 28  22 - 32 mmol/L Final  . Glucose, Bld 02/13/2017 81  65 - 99 mg/dL Final  . BUN 02/13/2017 24* 6 - 20 mg/dL Final  . Creatinine, Ser 02/13/2017 0.84  0.61 - 1.24 mg/dL Final  . Calcium 02/13/2017 9.9  8.9 - 10.3 mg/dL Final  . Total Protein 02/13/2017 9.0* 6.5 - 8.1 g/dL Final  . Albumin 02/13/2017 4.3  3.5 - 5.0 g/dL Final  . AST 02/13/2017 63* 15 - 41 U/L Final  . ALT 02/13/2017 50  17 - 63 U/L Final  . Alkaline Phosphatase 02/13/2017 92  38 - 126 U/L Final  . Total Bilirubin 02/13/2017 0.6  0.3 - 1.2 mg/dL Final  . GFR calc non Af Amer 02/13/2017 >60  >60 mL/min Final  . GFR calc Af Amer 02/13/2017 >60  >60 mL/min Final   Comment: (NOTE) The eGFR has been calculated using the CKD EPI equation. This calculation has not been validated in all clinical situations. eGFR's persistently <60 mL/min signify possible Chronic Kidney Disease.   . Anion gap 02/13/2017 7  5 - 15 Final  . CEA 02/13/2017 2.9  0.0 - 4.7 ng/mL Final   Comment: (NOTE)       Roche ECLIA methodology       Nonsmokers  <3.9                                     Smokers     <5.6 Performed At: [facility] [place], [place] [id number] Lindon Romp MD [id number]   . TSH 02/13/2017 3.189  0.350 - 4.500 uIU/mL Final    Assessment:  Gerald [name] is a 66 y.o. male with stage IV adenocarcinoma of  the lung and associated leukocytosis with eosinophilia. He presented with a 30 pound weight loss, shortness of breath, cough, and left leg discomfort. He has a 13-15 pack year smoking history.  Bronchoscopy on 03/30/2016 revealed an endobronchial lesion in the RUL anterior segment with 95% occlusion. There was extrinsic compression in the right middle lobe secondary to posterior mass effect. Pathology confirmed non-small cell lung cancer, favor adenocarcinoma.  PD-L1 testing revealed high expression (> 50%).  EGFR, ALK, and ROS1 were negative.  CEA was 2.3 on 03/27/2016.  PET scan on 04/05/2016 revealed intense hypermetabolism (SUV 17.95) associated with a 7.5 cm central perihilar right lung lesion. There was mediastinal (sub-carinal node SUV 8.89; 2.6 cm right paratracheal node SUV 17.49) and upper abdominal retroperitoneal hypermetabolic adenopathy (9 mm aortocaval node SUV 9.2). There were 2 liver metastasis (2.3 cm left lobe SUV 6.19; 2.2 cm segment VIII SUV 5.18). There are multifocal bone metastasis (C2 vertebral body SUV 9.0; 1.6 cm left sacral ala lesion SUV of 8.6).   He had marked leukocytosis with hypereosinophilia. Initial WBC was 72,300 with 53% eosinophils. Bone marrow on 03/29/2016 revealed marked increase in marrow eosinophils (approximate 30%). There was no diagnostic morphologic evidence of a myeloproliferative or lymphoid neoplasm. Flow cytometry revealed significant increase in eosinophils (54%). There was relative decreased myeloid cells with no significant immunophenotypic abnormalities or increase in blasts. There was no lymphoid abnormalities or evidence of clonality. FISH studies were negative for myeloproliferative neoplasms with eosinophilia (PDGFRA, PDGFRB, FGFR1). BCR-ABL was negative on 03/28/2016.  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  Left lower extremity duplex on 03/25/2016 revealed no evidence of DVT.  Discomfort improved on Neurontin 300 mg a day and continues to improve with declining  eosinophilia.  He received 41.4 Gy from 04/11/2016 - 05/23/2016.  He received 4 weeks of concurrent carboplatin and Taxol (04/17/2016 - 05/22/2016).    He has received 8 cycles of Keytruda (07/03/2016 - 10/12/2016; 11/22/2016 - 01/02/2017).  He has tolerated treatment well.  He was diagnosed with aspiration pneumonia on 06/16/2016.  He received 10 days of Levaquin.  He completed a course of Augmentin.  He receives [place] every 6 weeks (began 05/22/2016; last 01/02/2017)  PET scan on 11/14/2016 revealed 2 liver masses. Both were significantly more hypermetabolic than on 12/19/3233 and have enlarged compared to the prior PET-CT.  The segment 8 lesion was centrally necrotic but with high activity along its periphery (7.4), and about the same size as it was on 10/19/2016. The lateral segment left hepatic lobe lesion had enlarged (5.9 x 5.3 cm) compared to 10/19/16 (4.1 x 4.4 cm).  The bony metastatic lesions were no longer hypermetabolic and have resolved.  There were post therapy and postoperative findings in the right lung, with a considerable amount of suspected radiation pneumonitis and radiation fibrosis.  Chest, abdomen, and pelvic CT on 12/29/2016 revealed mild reduction in size and central necrosis of the hepatic metastatic lesions.  There was continued post therapy related findings along the right hilar region. Dominant right perihilar mass was reduced in thickness compared to 10/19/2016.   Symptomatically, he feels well.  He is active and gaining weight. Code status is DNR/DNI.  Plan: 1.  Labs today:  CBC with diff, CMP, Mg, TSH, free T4. Adequate to proceed with treatment. 2.  Cycle #10 Keytruda today. 3.  Continue Xgeva every 6 weeks (alternating cycles). Patient receive treatment today. 4.  RTC in 3 weeks for MD assessment, labs (CBC with diff, CMP, Mg), and cycle #11 Keytruda.   Lloyd Huger,  MD  02/17/2017, 7:11 PM

## 2017-02-14 LAB — CEA: CEA: 2.9 ng/mL (ref 0.0–4.7)

## 2017-02-24 IMAGING — PT NM PET TUM IMG INITIAL (PI) SKULL BASE T - THIGH
1 of 11 series · 1 of 25 positions shown · non-contrast
Comparison: 03/27/2016

CLINICAL DATA: Initial Treatment strategy for Lung cancer.

EXAM:
NUCLEAR MEDICINE PET SKULL BASE TO THIGH
TECHNIQUE: 12.2 mCi F-18 FDG was injected intravenously. Full-ring PET imaging
was performed from the skull base to thigh after the radiotracer. CT
data was obtained and used for attenuation correction and anatomic
localization.
FASTING BLOOD GLUCOSE:  Value: 82 mg/dl

[Series 3: ct wb 5.0 b30f · axial · 5.0mm · 0.98mm/px · 1 of 329 slices shown]
[im 329/329  brain]
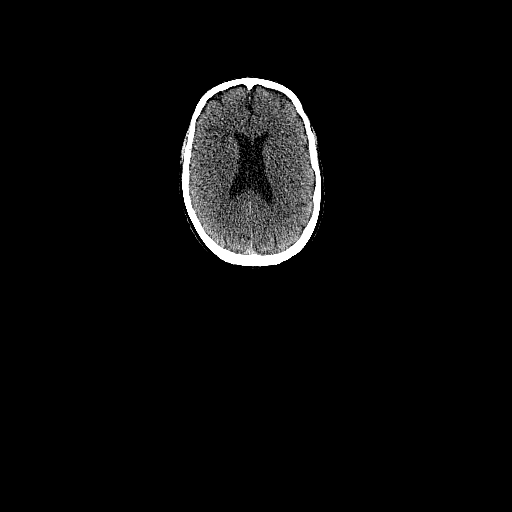

[1 of 25 positions shown; findings below may reference images not displayed]

FINDINGS: NECK

No hypermetabolic lymph nodes in the neck.

CHEST

Within the right lung there is a central perihilar lung mass
measuring 7.5 cm. This has an SUV max equal to 17.95.
Postobstructive pneumonitis is identified within the right middle
lobe. Several small nodules are scattered within the right middle
lobe and right upper lobe which may represent satellite nodules.
There is also evidence of interlobular septal thickening in the
basilar portion of the right upper lobe and right middle lobe which
may reflect lymphangitic spread of tumor.

Enlarged and hypermetabolic sub- carinal lymph node has an SUV max
equal to 8.89. Right paratracheal lymph node measures 2.6 cm and has
an SUV max equal to 17.49.

ABDOMEN/PELVIS

Two hypermetabolic lesions are identified within the liver. Index
lesion within the lateral segment of the left lobe measures
approximately 2.3 cm and has an SUV max equal to 6.19. Within
segment 8 of the liver there is a lesion which measures
approximately 2.2 cm and has an SUV max equal to 5.18. No abnormal
uptake identified within the pancreas, spleen or adrenal glands.
Left periaortic lymph node measures 1.2 cm and has an SUV max equal
to 4.3. There is an aortocaval lymph node which measures 9 mm and
has an SUV max equal to 9.2.

SKELETON

Multifocal hypermetabolic bone metastases. Index lesion involving
the C2 vertebra has an SUV max equal to 9.0. Index lesion within the
left sacral ala measures approximately 1.6 cm and has an SUV max
equal to 8.6.
IMPRESSION: 1. Intense hyper metabolism is associated with the central,
perihilar right lung lesion.
2. Mediastinal and upper abdominal retroperitoneal hypermetabolic
adenopathy.
3. Liver metastasis
4. Multifocal bone metastases.

## 2017-03-05 ENCOUNTER — Other Ambulatory Visit: Payer: Self-pay | Admitting: *Deleted

## 2017-03-05 DIAGNOSIS — C3490 Malignant neoplasm of unspecified part of unspecified bronchus or lung: Secondary | ICD-10-CM

## 2017-03-06 ENCOUNTER — Other Ambulatory Visit: Payer: Self-pay | Admitting: Hematology and Oncology

## 2017-03-06 ENCOUNTER — Inpatient Hospital Stay: Payer: Medicare Other

## 2017-03-06 ENCOUNTER — Inpatient Hospital Stay (HOSPITAL_BASED_OUTPATIENT_CLINIC_OR_DEPARTMENT_OTHER): Payer: Medicare Other | Admitting: Hematology and Oncology

## 2017-03-06 ENCOUNTER — Encounter: Payer: Self-pay | Admitting: Hematology and Oncology

## 2017-03-06 VITALS — BP 101/62 | HR 87 | Temp 95.8°F | Resp 18 | Wt 138.1 lb

## 2017-03-06 DIAGNOSIS — C3411 Malignant neoplasm of upper lobe, right bronchus or lung: Secondary | ICD-10-CM

## 2017-03-06 DIAGNOSIS — R590 Localized enlarged lymph nodes: Secondary | ICD-10-CM

## 2017-03-06 DIAGNOSIS — C3490 Malignant neoplasm of unspecified part of unspecified bronchus or lung: Secondary | ICD-10-CM

## 2017-03-06 DIAGNOSIS — Z87891 Personal history of nicotine dependence: Secondary | ICD-10-CM | POA: Diagnosis not present

## 2017-03-06 DIAGNOSIS — Z87448 Personal history of other diseases of urinary system: Secondary | ICD-10-CM

## 2017-03-06 DIAGNOSIS — Z5112 Encounter for antineoplastic immunotherapy: Secondary | ICD-10-CM | POA: Diagnosis not present

## 2017-03-06 DIAGNOSIS — C787 Secondary malignant neoplasm of liver and intrahepatic bile duct: Secondary | ICD-10-CM

## 2017-03-06 DIAGNOSIS — Z8701 Personal history of pneumonia (recurrent): Secondary | ICD-10-CM | POA: Diagnosis not present

## 2017-03-06 DIAGNOSIS — Z923 Personal history of irradiation: Secondary | ICD-10-CM | POA: Diagnosis not present

## 2017-03-06 DIAGNOSIS — M069 Rheumatoid arthritis, unspecified: Secondary | ICD-10-CM

## 2017-03-06 DIAGNOSIS — C7951 Secondary malignant neoplasm of bone: Secondary | ICD-10-CM

## 2017-03-06 DIAGNOSIS — Z801 Family history of malignant neoplasm of trachea, bronchus and lung: Secondary | ICD-10-CM

## 2017-03-06 DIAGNOSIS — I998 Other disorder of circulatory system: Secondary | ICD-10-CM

## 2017-03-06 DIAGNOSIS — D721 Eosinophilia: Secondary | ICD-10-CM

## 2017-03-06 DIAGNOSIS — Z66 Do not resuscitate: Secondary | ICD-10-CM | POA: Diagnosis not present

## 2017-03-06 DIAGNOSIS — Z808 Family history of malignant neoplasm of other organs or systems: Secondary | ICD-10-CM

## 2017-03-06 LAB — COMPREHENSIVE METABOLIC PANEL
ALT: 41 U/L (ref 17–63)
AST: 55 U/L — ABNORMAL HIGH (ref 15–41)
Albumin: 4.1 g/dL (ref 3.5–5.0)
Alkaline Phosphatase: 76 U/L (ref 38–126)
Anion gap: 3 — ABNORMAL LOW (ref 5–15)
BUN: 17 mg/dL (ref 6–20)
CO2: 31 mmol/L (ref 22–32)
Calcium: 10.1 mg/dL (ref 8.9–10.3)
Chloride: 100 mmol/L — ABNORMAL LOW (ref 101–111)
Creatinine, Ser: 0.89 mg/dL (ref 0.61–1.24)
GFR calc Af Amer: >60 mL/min (ref >60)
GFR calc non Af Amer: >60 mL/min (ref >60)
Glucose, Bld: 102 mg/dL — ABNORMAL HIGH (ref 65–99)
Potassium: 4.2 mmol/L (ref 3.5–5.1)
Sodium: 134 mmol/L — ABNORMAL LOW (ref 135–145)
Total Bilirubin: 0.5 mg/dL (ref 0.3–1.2)
Total Protein: 8.4 g/dL — ABNORMAL HIGH (ref 6.5–8.1)

## 2017-03-06 LAB — CBC WITH DIFFERENTIAL/PLATELET
Basophils Absolute: 0.1 10*3/uL (ref 0–0.1)
Basophils Relative: 2 %
Eosinophils Absolute: 0.4 10*3/uL (ref 0–0.7)
Eosinophils Relative: 6 %
HCT: 39.3 % — ABNORMAL LOW (ref 40.0–52.0)
Hemoglobin: 13.7 g/dL (ref 13.0–18.0)
Lymphocytes Relative: 25 %
Lymphs Abs: 1.9 10*3/uL (ref 1.0–3.6)
MCH: 33.4 pg (ref 26.0–34.0)
MCHC: 35 g/dL (ref 32.0–36.0)
MCV: 95.4 fL (ref 80.0–100.0)
Monocytes Absolute: 1.1 10*3/uL — ABNORMAL HIGH (ref 0.2–1.0)
Monocytes Relative: 15 %
Neutro Abs: 4.1 10*3/uL (ref 1.4–6.5)
Neutrophils Relative %: 54 %
Platelets: 200 10*3/uL (ref 150–440)
RBC: 4.12 MIL/uL — ABNORMAL LOW (ref 4.40–5.90)
RDW: 12.6 % (ref 11.5–14.5)
WBC: 7.7 10*3/uL (ref 3.8–10.6)

## 2017-03-06 MED ORDER — SODIUM CHLORIDE 0.9 % IV SOLN
200.0000 mg | Freq: Once | INTRAVENOUS | Status: AC
  Administered 2017-03-06: 200 mg via INTRAVENOUS
  Filled 2017-03-06: qty 8

## 2017-03-06 MED ORDER — SODIUM CHLORIDE 0.9 % IV SOLN
Freq: Once | INTRAVENOUS | Status: AC
  Administered 2017-03-06: 12:00:00 via INTRAVENOUS
  Filled 2017-03-06: qty 1000

## 2017-03-06 NOTE — Progress Notes (Signed)
[place] [facility] day:  03/06/2017   Chief Complaint: Gerald [name] is a 66 y.o. male with metastatic lung cancer with associated hypereosinophilia who is seen for assessment prior to cycle #11 Keytruda.  HPI:  The patient was last seen in the medical oncology clinic by me on 01/23/2017.  At that time, he was doing well.  He was active and gaining weight.  He received Keytruda.  TSH was normal, but rising.  He saw Dr. Grayland [name] in my absence on 02/13/2017.  He was doing well.  He received [place].  During the interim, he has continued to do well.  He has had a little cough. He denies any sputum production or fever. He has been very active.   Past Medical History:  Diagnosis Date  . Arthritis    Rheumatoid arthritis  . Cancer ([place])    lung ca   . Collagen vascular disease (H[facility])    RA  . Mass of lung   . Pneumothorax    spontaneous  . Renal disorder    as a child    Past Surgical History:  Procedure Laterality Date  . FLEXIBLE BRONCHOSCOPY N/A 03/30/2016   Procedure: FLEXIBLE BRONCHOSCOPY;  Surgeon: Gerald Boehringer, MD;  Location: [facility] ORS;  Service: Cardiopulmonary;  Laterality: N/A;  . Ureter repair as a child      Family History  Problem Relation Age of Onset  . Brain cancer Mother   . Lung cancer Maternal Uncle   . Cancer Sister     Metastatic cancer- unknown primary    Social History:  reports that he quit smoking about 16 years ago. His smoking use included Cigarettes. He has a 13.00 pack-year smoking history. He has never used smokeless tobacco. He reports that he drinks alcohol. He reports that he does not use drugs.  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  He will be getting a refrigerator soon.  He lives in [place].  Contact number is [phone].  The patient is alone today.  Allergies:  Allergies  Allergen Reactions  . Nsaids Other (See Comments)    Pt is  unable to take this class of medications.    . Sulfa Antibiotics Other (See Comments)    Reaction:  GI upset     Current Medications: Current Outpatient Prescriptions  Medication Sig Dispense Refill  . calcium-vitamin D (OSCAL WITH D) 500-200 MG-UNIT tablet Take 1 tablet by mouth 2 (two) times daily. 937 tablet 0  . folic acid (FOLVITE) 1 MG tablet Take 1 tablet (1 mg total) by mouth daily. 30 tablet 3  . gabapentin (NEURONTIN) 300 MG capsule Take 1 capsule (300 mg total) by mouth at bedtime. 30 capsule 2  . HYDROcodone-acetaminophen (NORCO) 7.5-325 MG tablet Take 1 tablet by mouth every 4 (four) hours as needed for moderate pain (Every 4-6 hours as needed for pain). 30 tablet 0  . oxyCODONE (OXY IR/ROXICODONE) 5 MG immediate release tablet Take 1 tablet (5 mg total) by mouth every 4 (four) hours as needed for severe pain. 30 tablet 0  . oxyCODONE-acetaminophen (PERCOCET/ROXICET) 5-325 MG tablet Take 1 tablet by mouth every 6 (six) hours as needed for severe pain. 30 tablet 0  . sucralfate (CARAFATE) 1 g tablet Take 1 tablet (1 g total) by mouth 3 (three) times daily. Dissolve in 2-3 tbsp warm water, swish and swallow. 90 tablet 3  .  ondansetron (ZOFRAN) 8 MG tablet Take 1 tablet (8 mg total) by mouth 2 (two) times daily as needed for nausea or vomiting. (Patient not taking: Reported on 12/12/2016) 20 tablet 0   No current facility-administered medications for this visit.     Review of Systems:  GENERAL:  Feels "good".  Active.  No fever, chills or sweats.  Weight up 1 pound. PERFORMANCE STATUS (ECOG):  2 HEENT: No visual changes, sore throat, mouth sores or tenderness. Lungs:  Little cough.  No shortness of breath. No hemoptysis. Cardiac:  No chest pain, palpitations, orthopnea, or PND.  Sleeps in a recliner. GI:  Appetite good.  No nausea, vomiting, diarrhea, constipation, melena or hematochezia. GU:  No urgency, frequency, dysuria, or hematuria. Musculoskeletal:  Chronic right knee pain.  No back pain.  No muscle tenderness. Extremities:  No pain or swelling. Skin:  No rashes or skin changes. Neuro:  Neuropathy in left great toe and foot up to ankle (stable) on gabapentin.  No headache, focal weakness, balance or coordination issues. Endocrine:  No diabetes, thyroid issues, hot flashes or night sweats. Psych:  No mood changes, depression or anxiety. Pain:  No pain.  Review of systems:  All other systems reviewed and found to be negative.  Physical Exam: Blood pressure 101/62, pulse 87, temperature (!) 95.8 F (35.4 C), temperature source Tympanic, resp. rate 18, weight 138 lb 1 oz (62.6 kg). GENERAL:  Thin gentleman sitting comfortably in the exam room in no acute distress. MENTAL STATUS:  Alert and oriented to person, place and time.  HEAD:Wearing a cap. Long gray hair in pony tail. [name]. Temporal wasting. Facial irritation.  Normocephalic, atraumatic, face symmetric, no Cushingoid features. EYES:Blue eyes. Pupils equal round and reactive to light and accomodation. No conjunctivitis or scleral icterus. XNA:TFTDDUKGUR clear without lesion. Tonguenormal. Mucous membranes moist. RESPIRATORY:Clear to auscultationwithout rales, wheezes or rhonchi. CARDIOVASCULAR:Regular rate andrhythmwithout murmur, rub or gallop. ABDOMEN:Soft, non-tender, with active bowel sounds and no hepatosplenomegaly. No masses. SKIN: No rashes, ulcers or lesions. EXTREMITIES: No edema, no skin discoloration or tenderness. No palpable cords. LYMPHNODES: No palpable cervical, supraclavicular, axillary or inguinal adenopathy  NEUROLOGICAL: Unremarkable. PSYCH: Appropriate.    Imaging studies:  03/27/2016:  Chest CT angiogram revealed an 8 cm mass in the medial right upper lobe. The mass abutted the mediastinum and possibly invaded the left atrium. There was associated widespread thoracic adenopathy. There was a 2.6 cm enhancing lesion in the medial segment of the  left hepatic lobe. 04/05/2016:  PET scan revealed intense hypermetabolism (SUV 17.95) associated with a 7.5 cm central perihilar right lung lesion. There was mediastinal (sub-carinal node SUV 8.89; 2.6 cm right paratracheal node SUV 17.49) and upper abdominal retroperitoneal hypermetabolic adenopathy (9 mm aortocaval node SUV 9.2). There were 2 liver metastasis (2.3 cm left lobe SUV 6.19; 2.2 cm segment VIII SUV 5.18). There are multifocal bone metastasis (C2 vertebral body SUV 9.0; 1.6 cm left sacral ala lesion SUV of 8.6).  03/31/2016:  Head MRI was indeterminate for early metastatic disease to the brain. There was a small focus of gyral enhancement in the anterior right frontal lobe likely is post ischemic, while a small posterior right cerebellar enhancing lesion was more suspicious for small brain metastasis. There were multiple small primarily subacute appearing infarcts in the bilateral MCA and left PICA territories.  04/07/2016:  Bone scan revealed a subtle distal right femur lesion which might be a small metastasis.  Bone scan was felt to be an unreliable modality for  evaluation of osseous metastatic disease as the bone metastases seen on the recent PET and MRI were not evident. 05/12/2016:  Head MRI revealed expected interval evolution of bilateral cerebral in cerebellar infarcts. There was resolution of right frontal and right cerebellar enhancement consistent with subacute infarcts. There was a new 3 mm focus of gyral enhancement in the left occipital lobe and a possible new 4 mm enhancing lesion in the left cerebellum (? vascular enhancement versus tiny metastasis).  08/16/2016:  Head MRI revealed multiple areas of improving enhancement and restricted diffusion compatible with resolving subacute infarcts.  There was no evidence of metastatic disease or acute infarction  10/19/2016:  Chest, abdomen, and pelvic CT scan revealed radiation changes in the right lung.  The superimposed right lung  pneumonia had improved.  The 6.4 x 2.4 cm right perihilar mass had decreased.  There was improving thoracic lymphadenopathy.  There was progression of hepatic metastases, measuring up to 4.7 cm.  Specifically, there was a 3.9 x 3.7 cm metastasis in segment 8 (previously 2.5 x 2.8 cm) and a 4.7 x 4.0 cm metastasis in segment 4B (previously 3.1 x 2.6 cm). 11/14/2016:  PET scan revealed 2 liver masses. Both were significantly more hypermetabolic than on 15/04/6978 and have enlarged compared to the prior PET-CT.  The segment 8 lesion was centrally necrotic but with high activity along its periphery (7.4), and about the same size as it was on 10/19/2016. The lateral segment left hepatic lobe lesion had enlarged (5.9 x 5.3 cm) compared to 10/19/16 (4.1 x 4.4 cm).  The bony metastatic lesions were no longer hypermetabolic and have resolved.  There were post therapy and postoperative findings in the right lung, with a considerable amount of suspected radiation pneumonitis and radiation fibrosis. 12/29/2016:  Chest, abdomen, and pelvic CT revealed mild reduction in size and central necrosis of the hepatic metastatic lesions.  There was continued post therapy related findings along the right hilar region. Dominant right perihilar mass was reduced in thickness compared to 10/19/2016.    Appointment on 03/06/2017  Component Date Value Ref Range Status  . WBC 03/06/2017 7.7  3.8 - 10.6 K/uL Final  . RBC 03/06/2017 4.12* 4.40 - 5.90 MIL/uL Final  . Hemoglobin 03/06/2017 13.7  13.0 - 18.0 g/dL Final  . HCT 03/06/2017 39.3* 40.0 - 52.0 % Final  . MCV 03/06/2017 95.4  80.0 - 100.0 fL Final  . MCH 03/06/2017 33.4  26.0 - 34.0 pg Final  . MCHC 03/06/2017 35.0  32.0 - 36.0 g/dL Final  . RDW 03/06/2017 12.6  11.5 - 14.5 % Final  . Platelets 03/06/2017 200  150 - 440 K/uL Final  . Neutrophils Relative % 03/06/2017 54  % Final  . Neutro Abs 03/06/2017 4.1  1.4 - 6.5 K/uL Final  . Lymphocytes Relative 03/06/2017 25  %  Final  . Lymphs Abs 03/06/2017 1.9  1.0 - 3.6 K/uL Final  . Monocytes Relative 03/06/2017 15  % Final  . Monocytes Absolute 03/06/2017 1.1* 0.2 - 1.0 K/uL Final  . Eosinophils Relative 03/06/2017 6  % Final  . Eosinophils Absolute 03/06/2017 0.4  0 - 0.7 K/uL Final  . Basophils Relative 03/06/2017 2  % Final  . Basophils Absolute 03/06/2017 0.1  0 - 0.1 K/uL Final  . Sodium 03/06/2017 134* 135 - 145 mmol/L Final  . Potassium 03/06/2017 4.2  3.5 - 5.1 mmol/L Final  . Chloride 03/06/2017 100* 101 - 111 mmol/L Final  . CO2 03/06/2017 31  22 -  32 mmol/L Final  . Glucose, Bld 03/06/2017 102* 65 - 99 mg/dL Final  . BUN 03/06/2017 17  6 - 20 mg/dL Final  . Creatinine, Ser 03/06/2017 0.89  0.61 - 1.24 mg/dL Final  . Calcium 03/06/2017 10.1  8.9 - 10.3 mg/dL Final  . Total Protein 03/06/2017 8.4* 6.5 - 8.1 g/dL Final  . Albumin 03/06/2017 4.1  3.5 - 5.0 g/dL Final  . AST 03/06/2017 55* 15 - 41 U/L Final  . ALT 03/06/2017 41  17 - 63 U/L Final  . Alkaline Phosphatase 03/06/2017 76  38 - 126 U/L Final  . Total Bilirubin 03/06/2017 0.5  0.3 - 1.2 mg/dL Final  . GFR calc non Af Amer 03/06/2017 >60  >60 mL/min Final  . GFR calc Af Amer 03/06/2017 >60  >60 mL/min Final   Comment: (NOTE) The eGFR has been calculated using the CKD EPI equation. This calculation has not been validated in all clinical situations. eGFR's persistently <60 mL/min signify possible Chronic Kidney Disease.   . Anion gap 03/06/2017 3* 5 - 15 Final    Assessment:  Vencil Basnett is a 66 y.o. male with stage IV adenocarcinoma of the lung and associated leukocytosis with eosinophilia. He presented with a 30 pound weight loss, shortness of breath, cough, and left leg discomfort. He has a 13-15 pack year smoking history.  Bronchoscopy on 03/30/2016 revealed an endobronchial lesion in the RUL anterior segment with 95% occlusion. There was extrinsic compression in the right middle lobe secondary to posterior mass effect.  Pathology confirmed non-small cell lung cancer, favor adenocarcinoma.  PD-L1 testing revealed high expression (> 50%).  EGFR, ALK, and ROS1 were negative.  CEA was 2.3 on 03/27/2016.  PET scan on 04/05/2016 revealed intense hypermetabolism (SUV 17.95) associated with a 7.5 cm central perihilar right lung lesion. There was mediastinal (sub-carinal node SUV 8.89; 2.6 cm right paratracheal node SUV 17.49) and upper abdominal retroperitoneal hypermetabolic adenopathy (9 mm aortocaval node SUV 9.2). There were 2 liver metastasis (2.3 cm left lobe SUV 6.19; 2.2 cm segment VIII SUV 5.18). There are multifocal bone metastasis (C2 vertebral body SUV 9.0; 1.6 cm left sacral ala lesion SUV of 8.6).   He had marked leukocytosis with hypereosinophilia. Initial WBC was 72,300 with 53% eosinophils. Bone marrow on 03/29/2016 revealed marked increase in marrow eosinophils (approximate 30%). There was no diagnostic morphologic evidence of a myeloproliferative or lymphoid neoplasm. Flow cytometry revealed significant increase in eosinophils (54%). There was relative decreased myeloid cells with no significant immunophenotypic abnormalities or increase in blasts. There was no lymphoid abnormalities or evidence of clonality. FISH studies were negative for myeloproliferative neoplasms with eosinophilia (PDGFRA, PDGFRB, FGFR1). BCR-ABL was negative on 03/28/2016.  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  Left lower extremity duplex on 03/25/2016 revealed no evidence of DVT. Discomfort improved on Neurontin 300 mg a day and continues to improve with declining eosinophilia.  He received 41.4 Gy from 04/11/2016 - 05/23/2016.  He received 4 weeks of concurrent carboplatin and Taxol (04/17/2016 - 05/22/2016).    He has received 10 cycles of Keytruda (07/03/2016 - 10/12/2016; 11/22/2016 - 02/13/2017).  He has tolerated treatment well.  He was diagnosed with aspiration  pneumonia on 06/16/2016.  He received 10 days of Levaquin.  He completed a course of Augmentin.  He receives [place] every 6 weeks (began 05/22/2016; last 02/13/2017)  PET scan on 11/14/2016 revealed 2 liver masses. Both were significantly more hypermetabolic than  on 04/05/2016 and have enlarged compared to the prior PET-CT.  The segment 8 lesion was centrally necrotic but with high activity along its periphery (7.4), and about the same size as it was on 10/19/2016. The lateral segment left hepatic lobe lesion had enlarged (5.9 x 5.3 cm) compared to 10/19/16 (4.1 x 4.4 cm).  The bony metastatic lesions were no longer hypermetabolic and have resolved.  There were post therapy and postoperative findings in the right lung, with a considerable amount of suspected radiation pneumonitis and radiation fibrosis.  Chest, abdomen, and pelvic CT on 12/29/2016 revealed mild reduction in size and central necrosis of the hepatic metastatic lesions.  There was continued post therapy related findings along the right hilar region. Dominant right perihilar mass was reduced in thickness compared to 10/19/2016.   Symptomatically, he feels well.  He is active and gaining weight. Code status is DNR/DNI.  Plan: 1.  Labs today:  CBC with diff, CMP, Mg, TSH, free T4. 2.  Cycle #11 Keytruda today. 3.  Continue Xgeva every 6 weeks (alternating cycles).  Due next cycle. 4.  Discuss plans for reimaging between 03/27/2017 and 04/17/2017.  Patient would like to repeat imaging in 04/2017. 5.  RTC in 3 weeks for MD assessment, labs (CBC with diff, CMP, Mg, TSH), Xgeva, and cycle #12 Keytruda.   Lequita Asal, MD  03/06/2017, 10:52 AM

## 2017-03-06 NOTE — Progress Notes (Signed)
Patient offers no complaints other than he is coughing a little more than usual.

## 2017-03-07 LAB — CEA: CEA: 3 ng/mL (ref 0.0–4.7)

## 2017-03-07 LAB — THYROID PANEL WITH TSH
Free Thyroxine Index: 2 (ref 1.2–4.9)
T3 Uptake Ratio: 22 % — ABNORMAL LOW (ref 24–39)
T4, Total: 8.9 ug/dL (ref 4.5–12.0)
TSH: 2.6 u[IU]/mL (ref 0.450–4.500)

## 2017-03-27 ENCOUNTER — Inpatient Hospital Stay: Payer: Medicare Other

## 2017-03-27 ENCOUNTER — Encounter: Payer: Self-pay | Admitting: Hematology and Oncology

## 2017-03-27 ENCOUNTER — Inpatient Hospital Stay: Payer: Medicare Other | Attending: Hematology and Oncology | Admitting: Hematology and Oncology

## 2017-03-27 ENCOUNTER — Other Ambulatory Visit: Payer: Self-pay | Admitting: *Deleted

## 2017-03-27 VITALS — BP 110/74 | HR 84 | Temp 95.9°F | Resp 18 | Wt 140.0 lb

## 2017-03-27 DIAGNOSIS — R05 Cough: Secondary | ICD-10-CM

## 2017-03-27 DIAGNOSIS — Z801 Family history of malignant neoplasm of trachea, bronchus and lung: Secondary | ICD-10-CM | POA: Insufficient documentation

## 2017-03-27 DIAGNOSIS — C787 Secondary malignant neoplasm of liver and intrahepatic bile duct: Secondary | ICD-10-CM

## 2017-03-27 DIAGNOSIS — Z66 Do not resuscitate: Secondary | ICD-10-CM | POA: Diagnosis not present

## 2017-03-27 DIAGNOSIS — M069 Rheumatoid arthritis, unspecified: Secondary | ICD-10-CM | POA: Diagnosis not present

## 2017-03-27 DIAGNOSIS — D72829 Elevated white blood cell count, unspecified: Secondary | ICD-10-CM | POA: Insufficient documentation

## 2017-03-27 DIAGNOSIS — G629 Polyneuropathy, unspecified: Secondary | ICD-10-CM | POA: Insufficient documentation

## 2017-03-27 DIAGNOSIS — I999 Unspecified disorder of circulatory system: Secondary | ICD-10-CM

## 2017-03-27 DIAGNOSIS — C7951 Secondary malignant neoplasm of bone: Secondary | ICD-10-CM | POA: Diagnosis not present

## 2017-03-27 DIAGNOSIS — Z808 Family history of malignant neoplasm of other organs or systems: Secondary | ICD-10-CM | POA: Diagnosis not present

## 2017-03-27 DIAGNOSIS — Z7189 Other specified counseling: Secondary | ICD-10-CM

## 2017-03-27 DIAGNOSIS — C3411 Malignant neoplasm of upper lobe, right bronchus or lung: Secondary | ICD-10-CM | POA: Diagnosis not present

## 2017-03-27 DIAGNOSIS — Z87891 Personal history of nicotine dependence: Secondary | ICD-10-CM | POA: Insufficient documentation

## 2017-03-27 DIAGNOSIS — Z79899 Other long term (current) drug therapy: Secondary | ICD-10-CM | POA: Diagnosis not present

## 2017-03-27 DIAGNOSIS — Z5112 Encounter for antineoplastic immunotherapy: Secondary | ICD-10-CM | POA: Diagnosis not present

## 2017-03-27 DIAGNOSIS — D721 Eosinophilia: Secondary | ICD-10-CM | POA: Insufficient documentation

## 2017-03-27 DIAGNOSIS — Z8701 Personal history of pneumonia (recurrent): Secondary | ICD-10-CM | POA: Diagnosis not present

## 2017-03-27 DIAGNOSIS — Z85118 Personal history of other malignant neoplasm of bronchus and lung: Secondary | ICD-10-CM

## 2017-03-27 DIAGNOSIS — Z809 Family history of malignant neoplasm, unspecified: Secondary | ICD-10-CM

## 2017-03-27 LAB — COMPREHENSIVE METABOLIC PANEL
ALT: 50 U/L (ref 17–63)
AST: 61 U/L — ABNORMAL HIGH (ref 15–41)
Albumin: 4 g/dL (ref 3.5–5.0)
Alkaline Phosphatase: 87 U/L (ref 38–126)
Anion gap: 4 — ABNORMAL LOW (ref 5–15)
BUN: 21 mg/dL — ABNORMAL HIGH (ref 6–20)
CO2: 29 mmol/L (ref 22–32)
Calcium: 9.7 mg/dL (ref 8.9–10.3)
Chloride: 103 mmol/L (ref 101–111)
Creatinine, Ser: 0.8 mg/dL (ref 0.61–1.24)
GFR calc Af Amer: >60 mL/min (ref >60)
GFR calc non Af Amer: >60 mL/min (ref >60)
Glucose, Bld: 83 mg/dL (ref 65–99)
Potassium: 4.6 mmol/L (ref 3.5–5.1)
Sodium: 136 mmol/L (ref 135–145)
Total Bilirubin: 0.5 mg/dL (ref 0.3–1.2)
Total Protein: 8.5 g/dL — ABNORMAL HIGH (ref 6.5–8.1)

## 2017-03-27 LAB — CBC WITH DIFFERENTIAL/PLATELET
Basophils Absolute: 0.1 10*3/uL (ref 0–0.1)
Basophils Relative: 1 %
Eosinophils Absolute: 0.5 10*3/uL (ref 0–0.7)
Eosinophils Relative: 6 %
HCT: 40.8 % (ref 40.0–52.0)
Hemoglobin: 14.1 g/dL (ref 13.0–18.0)
Lymphocytes Relative: 24 %
Lymphs Abs: 2 10*3/uL (ref 1.0–3.6)
MCH: 33.1 pg (ref 26.0–34.0)
MCHC: 34.7 g/dL (ref 32.0–36.0)
MCV: 95.6 fL (ref 80.0–100.0)
Monocytes Absolute: 1.4 10*3/uL — ABNORMAL HIGH (ref 0.2–1.0)
Monocytes Relative: 17 %
Neutro Abs: 4.2 10*3/uL (ref 1.4–6.5)
Neutrophils Relative %: 52 %
Platelets: 198 10*3/uL (ref 150–440)
RBC: 4.27 MIL/uL — ABNORMAL LOW (ref 4.40–5.90)
RDW: 12.9 % (ref 11.5–14.5)
WBC: 8.2 10*3/uL (ref 3.8–10.6)

## 2017-03-27 LAB — MAGNESIUM: Magnesium: 1.8 mg/dL (ref 1.7–2.4)

## 2017-03-27 LAB — TSH: TSH: 4.865 u[IU]/mL — ABNORMAL HIGH (ref 0.350–4.500)

## 2017-03-27 LAB — T4, FREE: Free T4: 0.9 ng/dL (ref 0.61–1.12)

## 2017-03-27 MED ORDER — DENOSUMAB 120 MG/1.7ML ~~LOC~~ SOLN
120.0000 mg | Freq: Once | SUBCUTANEOUS | Status: AC
  Administered 2017-03-27: 120 mg via SUBCUTANEOUS
  Filled 2017-03-27: qty 1.7

## 2017-03-27 MED ORDER — SODIUM CHLORIDE 0.9 % IV SOLN
Freq: Once | INTRAVENOUS | Status: AC
  Administered 2017-03-27: 11:00:00 via INTRAVENOUS
  Filled 2017-03-27: qty 1000

## 2017-03-27 MED ORDER — OXYCODONE-ACETAMINOPHEN 5-325 MG PO TABS
1.0000 | ORAL_TABLET | Freq: Four times a day (QID) | ORAL | 0 refills | Status: DC | PRN
Start: 2017-03-27 — End: 2017-08-22

## 2017-03-27 MED ORDER — SODIUM CHLORIDE 0.9 % IV SOLN
200.0000 mg | Freq: Once | INTRAVENOUS | Status: AC
  Administered 2017-03-27: 200 mg via INTRAVENOUS
  Filled 2017-03-27: qty 8

## 2017-03-27 NOTE — Progress Notes (Signed)
Patient had diarrhea on Sunday.  Since that time he has been fine.

## 2017-03-27 NOTE — Progress Notes (Signed)
[facility] day:  03/27/2017   Chief Complaint: Gerald [name] is a 66 y.o. male with metastatic lung cancer with associated hypereosinophilia who is seen for assessment prior to cycle #12 Keytruda.  HPI:  The patient was last seen in the medical oncology clinic by me on 03/06/2017.  At that time, he was doing well.  He had a little cough.  He was active.  He received cycle #11 Keytruda.  During the interim, he denies any complaints. He had a brief episode of diarrhea on Sunday, 03/25/2017.  His had no further episodes. He has some scratches on his arms secondary to playing with his dog, a great pyrenees.  He has had no rash. He denies any increase shortness of breath.   Past Medical History:  Diagnosis Date  . Arthritis    Rheumatoid arthritis  . Cancer ([place])    lung ca   . Collagen vascular disease ([facility])    RA  . Mass of lung   . Pneumothorax    spontaneous  . Renal disorder    as a child    Past Surgical History:  Procedure Laterality Date  . FLEXIBLE BRONCHOSCOPY N/A 03/30/2016   Procedure: FLEXIBLE BRONCHOSCOPY;  Surgeon: Vilinda Boehringer, MD;  Location: [facility] ORS;  Service: Cardiopulmonary;  Laterality: N/A;  . Ureter repair as a child      Family History  Problem Relation Age of Onset  . Brain cancer Mother   . Lung cancer Maternal Uncle   . Cancer Sister     Metastatic cancer- unknown primary    Social History:  reports that he quit smoking about 16 years ago. His smoking use included Cigarettes. He has a 13.00 pack-year smoking history. He has never used smokeless tobacco. He reports that he drinks alcohol. He reports that he does not use drugs.  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 He lives in [place].  Contact number is [phone].  He has a great pyrenees.  The patient is alone today.  Allergies:  Allergies  Allergen Reactions  . Nsaids Other (See Comments)    Pt is unable to take this class of medications.    . Sulfa Antibiotics Other (See Comments)    Reaction:  GI upset     Current Medications: Current Outpatient Prescriptions  Medication Sig Dispense Refill  . calcium-vitamin D (OSCAL WITH D) 500-200 MG-UNIT tablet Take 1 tablet by mouth 2 (two) times daily. 881 tablet 0  . folic acid (FOLVITE) 1 MG tablet Take 1 tablet (1 mg total) by mouth daily. 30 tablet 3  . gabapentin (NEURONTIN) 300 MG capsule Take 1 capsule (300 mg total) by mouth at bedtime. 30 capsule 2  . HYDROcodone-acetaminophen (NORCO) 7.5-325 MG tablet Take 1 tablet by mouth every 4 (four) hours as needed for moderate pain (Every 4-6 hours as needed for pain). 30 tablet 0  . oxyCODONE (OXY IR/ROXICODONE) 5 MG immediate release tablet Take 1 tablet (5 mg total) by mouth every 4 (four) hours as needed for severe pain. 30 tablet 0  . oxyCODONE-acetaminophen (PERCOCET/ROXICET) 5-325 MG tablet Take 1 tablet by mouth every 6 (six) hours as needed for severe pain. 30 tablet 0  . sucralfate (CARAFATE) 1 g tablet Take 1 tablet (1 g total) by mouth 3 (three) times daily. Dissolve in 2-3 tbsp warm water, swish and swallow. 90 tablet 3  . ondansetron (ZOFRAN)  8 MG tablet Take 1 tablet (8 mg total) by mouth 2 (two) times daily as needed for nausea or vomiting. (Patient not taking: Reported on 12/12/2016) 20 tablet 0   No current facility-administered medications for this visit.     Review of Systems:  GENERAL:  Feels "fine". No fever, chills or sweats.  Weight up 2 pounds. PERFORMANCE STATUS (ECOG):  2 HEENT: No visual changes, sore throat, mouth sores or tenderness. Lungs:  No shortness of breath or cough.  No hemoptysis. Cardiac:  No chest pain, palpitations, orthopnea, or PND.  Sleeps in a recliner. GI:  Appetite good.  Episode of diarrhea.  No nausea, vomiting, constipation, melena or hematochezia. GU:  No urgency, frequency, dysuria, or hematuria. Musculoskeletal:  Chronic right knee  pain. No back pain.  No muscle tenderness. Extremities:  No pain or swelling. Skin:  Dog scratches.  No rashes or skin changes. Neuro:  Neuropathy (stable) on gabapentin.  No headache, focal weakness, balance or coordination issues. Endocrine:  No diabetes, thyroid issues, hot flashes or night sweats. Psych:  No mood changes, depression or anxiety. Pain:  No pain.  Review of systems:  All other systems reviewed and found to be negative.  Physical Exam: Blood pressure 110/74, pulse 84, temperature (!) 95.9 F (35.5 C), temperature source Tympanic, resp. rate 18, weight 140 lb (63.5 kg). GENERAL:  Thin gentleman sitting comfortably in the exam room in no acute distress. MENTAL STATUS:  Alert and oriented to person, place and time.  HEAD:Wearing a cap. Long gray hair in pony tail. [name]. Temporal wasting. Normocephalic, atraumatic, face symmetric, no Cushingoid features. EYES:Blue eyes. Pupils equal round and reactive to light and accomodation. No conjunctivitis or scleral icterus. LEX:NTZGYFVCBS clear without lesion. Tonguenormal. Mucous membranes moist. RESPIRATORY:Clear to auscultationwithout rales, wheezes or rhonchi. CARDIOVASCULAR:Regular rate andrhythmwithout murmur, rub or gallop. ABDOMEN:Soft, non-tender, with active bowel sounds and no hepatosplenomegaly. No masses. SKIN: No rashes, ulcers or lesions. EXTREMITIES: No edema, no skin discoloration or tenderness. No palpable cords. LYMPHNODES: No palpable cervical, supraclavicular, axillary or inguinal adenopathy  NEUROLOGICAL: Unremarkable. PSYCH: Appropriate.    Imaging studies: 03/27/2016:  Chest CT angiogram revealed an 8 cm mass in the medial right upper lobe. The mass abutted the mediastinum and possibly invaded the left atrium. There was associated widespread thoracic adenopathy. There was a 2.6 cm enhancing lesion in the medial segment of the left hepatic lobe. 04/05/2016:  PET scan  revealed intense hypermetabolism (SUV 17.95) associated with a 7.5 cm central perihilar right lung lesion. There was mediastinal (sub-carinal node SUV 8.89; 2.6 cm right paratracheal node SUV 17.49) and upper abdominal retroperitoneal hypermetabolic adenopathy (9 mm aortocaval node SUV 9.2). There were 2 liver metastasis (2.3 cm left lobe SUV 6.19; 2.2 cm segment VIII SUV 5.18). There are multifocal bone metastasis (C2 vertebral body SUV 9.0; 1.6 cm left sacral ala lesion SUV of 8.6).  03/31/2016:  Head MRI was indeterminate for early metastatic disease to the brain. There was a small focus of gyral enhancement in the anterior right frontal lobe likely is post ischemic, while a small posterior right cerebellar enhancing lesion was more suspicious for small brain metastasis. There were multiple small primarily subacute appearing infarcts in the bilateral MCA and left PICA territories.  04/07/2016:  Bone scan revealed a subtle distal right femur lesion which might be a small metastasis.  Bone scan was felt to be an unreliable modality for evaluation of osseous metastatic disease as the bone metastases seen on the recent PET  and MRI were not evident. 05/12/2016:  Head MRI revealed expected interval evolution of bilateral cerebral in cerebellar infarcts. There was resolution of right frontal and right cerebellar enhancement consistent with subacute infarcts. There was a new 3 mm focus of gyral enhancement in the left occipital lobe and a possible new 4 mm enhancing lesion in the left cerebellum (? vascular enhancement versus tiny metastasis).  08/16/2016:  Head MRI revealed multiple areas of improving enhancement and restricted diffusion compatible with resolving subacute infarcts.  There was no evidence of metastatic disease or acute infarction  10/19/2016:  Chest, abdomen, and pelvic CT scan revealed radiation changes in the right lung.  The superimposed right lung pneumonia had improved.  The 6.4 x 2.4 cm  right perihilar mass had decreased.  There was improving thoracic lymphadenopathy.  There was progression of hepatic metastases, measuring up to 4.7 cm.  Specifically, there was a 3.9 x 3.7 cm metastasis in segment 8 (previously 2.5 x 2.8 cm) and a 4.7 x 4.0 cm metastasis in segment 4B (previously 3.1 x 2.6 cm). 11/14/2016:  PET scan revealed 2 liver masses. Both were significantly more hypermetabolic than on 00/71/2197 and have enlarged compared to the prior PET-CT.  The segment 8 lesion was centrally necrotic but with high activity along its periphery (7.4), and about the same size as it was on 10/19/2016. The lateral segment left hepatic lobe lesion had enlarged (5.9 x 5.3 cm) compared to 10/19/16 (4.1 x 4.4 cm).  The bony metastatic lesions were no longer hypermetabolic and have resolved.  There were post therapy and postoperative findings in the right lung, with a considerable amount of suspected radiation pneumonitis and radiation fibrosis. 12/29/2016:  Chest, abdomen, and pelvic CT revealed mild reduction in size and central necrosis of the hepatic metastatic lesions.  There was continued post therapy related findings along the right hilar region. Dominant right perihilar mass was reduced in thickness compared to 10/19/2016.    Appointment on 03/27/2017  Component Date Value Ref Range Status  . Sodium 03/27/2017 136  135 - 145 mmol/L Final  . Potassium 03/27/2017 4.6  3.5 - 5.1 mmol/L Final  . Chloride 03/27/2017 103  101 - 111 mmol/L Final  . CO2 03/27/2017 29  22 - 32 mmol/L Final  . Glucose, Bld 03/27/2017 83  65 - 99 mg/dL Final  . BUN 03/27/2017 21* 6 - 20 mg/dL Final  . Creatinine, Ser 03/27/2017 0.80  0.61 - 1.24 mg/dL Final  . Calcium 03/27/2017 9.7  8.9 - 10.3 mg/dL Final  . Total Protein 03/27/2017 8.5* 6.5 - 8.1 g/dL Final  . Albumin 03/27/2017 4.0  3.5 - 5.0 g/dL Final  . AST 03/27/2017 61* 15 - 41 U/L Final  . ALT 03/27/2017 50  17 - 63 U/L Final  . Alkaline Phosphatase  03/27/2017 87  38 - 126 U/L Final  . Total Bilirubin 03/27/2017 0.5  0.3 - 1.2 mg/dL Final  . GFR calc non Af Amer 03/27/2017 >60  >60 mL/min Final  . GFR calc Af Amer 03/27/2017 >60  >60 mL/min Final   Comment: (NOTE) The eGFR has been calculated using the CKD EPI equation. This calculation has not been validated in all clinical situations. eGFR's persistently <60 mL/min signify possible Chronic Kidney Disease.   . Anion gap 03/27/2017 4* 5 - 15 Final  . WBC 03/27/2017 8.2  3.8 - 10.6 K/uL Final  . RBC 03/27/2017 4.27* 4.40 - 5.90 MIL/uL Final  . Hemoglobin 03/27/2017 14.1  13.0 -  18.0 g/dL Final  . HCT 03/27/2017 40.8  40.0 - 52.0 % Final  . MCV 03/27/2017 95.6  80.0 - 100.0 fL Final  . MCH 03/27/2017 33.1  26.0 - 34.0 pg Final  . MCHC 03/27/2017 34.7  32.0 - 36.0 g/dL Final  . RDW 03/27/2017 12.9  11.5 - 14.5 % Final  . Platelets 03/27/2017 198  150 - 440 K/uL Final  . Neutrophils Relative % 03/27/2017 52  % Final  . Neutro Abs 03/27/2017 4.2  1.4 - 6.5 K/uL Final  . Lymphocytes Relative 03/27/2017 24  % Final  . Lymphs Abs 03/27/2017 2.0  1.0 - 3.6 K/uL Final  . Monocytes Relative 03/27/2017 17  % Final  . Monocytes Absolute 03/27/2017 1.4* 0.2 - 1.0 K/uL Final  . Eosinophils Relative 03/27/2017 6  % Final  . Eosinophils Absolute 03/27/2017 0.5  0 - 0.7 K/uL Final  . Basophils Relative 03/27/2017 1  % Final  . Basophils Absolute 03/27/2017 0.1  0 - 0.1 K/uL Final  . Magnesium 03/27/2017 1.8  1.7 - 2.4 mg/dL Final    Assessment:  Jaysiah Marchetta is a 66 y.o. male with stage IV adenocarcinoma of the lung and associated leukocytosis with eosinophilia. He presented with a 30 pound weight loss, shortness of breath, cough, and left leg discomfort. He has a 13-15 pack year smoking history.  Bronchoscopy on 03/30/2016 revealed an endobronchial lesion in the RUL anterior segment with 95% occlusion. There was extrinsic compression in the right middle lobe secondary to posterior mass  effect. Pathology confirmed non-small cell lung cancer, favor adenocarcinoma.  PD-L1 testing revealed high expression (> 50%).  EGFR, ALK, and ROS1 were negative.  CEA was 2.3 on 03/27/2016.  PET scan on 04/05/2016 revealed intense hypermetabolism (SUV 17.95) associated with a 7.5 cm central perihilar right lung lesion. There was mediastinal (sub-carinal node SUV 8.89; 2.6 cm right paratracheal node SUV 17.49) and upper abdominal retroperitoneal hypermetabolic adenopathy (9 mm aortocaval node SUV 9.2). There were 2 liver metastasis (2.3 cm left lobe SUV 6.19; 2.2 cm segment VIII SUV 5.18). There are multifocal bone metastasis (C2 vertebral body SUV 9.0; 1.6 cm left sacral ala lesion SUV of 8.6).   He had marked leukocytosis with hypereosinophilia. Initial WBC was 72,300 with 53% eosinophils. Bone marrow on 03/29/2016 revealed marked increase in marrow eosinophils (approximate 30%). There was no diagnostic morphologic evidence of a myeloproliferative or lymphoid neoplasm. Flow cytometry revealed significant increase in eosinophils (54%). There was relative decreased myeloid cells with no significant immunophenotypic abnormalities or increase in blasts. There was no lymphoid abnormalities or evidence of clonality. FISH studies were negative for myeloproliferative neoplasms with eosinophilia (PDGFRA, PDGFRB, FGFR1). BCR-ABL was negative on 03/28/2016.  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  Left lower extremity duplex on 03/25/2016 revealed no evidence of DVT. Discomfort improved on Neurontin 300 mg a day and continues to improve with declining eosinophilia.  He received 41.4 Gy from 04/11/2016 - 05/23/2016.  He received 4 weeks of concurrent carboplatin and Taxol (04/17/2016 - 05/22/2016).    He has received 11 cycles of Keytruda (07/03/2016 - 10/12/2016; 11/22/2016 - 03/06/2017).  He is tolerating treatment well.  He was diagnosed with  aspiration pneumonia on 06/16/2016.  He received 10 days of Levaquin.  He completed a course of Augmentin.  He receives [place] every 6 weeks (began 05/22/2016; last 02/13/2017)  Chest, abdomen, and pelvic CT on 12/29/2016 revealed mild reduction in size and  central necrosis of the hepatic metastatic lesions.  There was continued post therapy related findings along the right hilar region. Dominant right perihilar mass was reduced in thickness compared to 10/19/2016.   Symptomatically, he feels well.  He is active and gaining weight.  Code status is DNR/DNI.  Plan: 1.  Labs today:  CBC with diff, CMP, Mg, TSH. 2.  Cycle #12 Keytruda today. 3.  Continue Xgeva every 6 weeks (alternating cycles).  Due today. 4.  Chest, abdomen, and pelvic CT scan on 04/13/2017 5.  RTC in 3 weeks for MD assessment, labs (CBC with diff, CMP, Mg, TSH), cycle #13 Keytruda.   Lequita Asal, MD  03/27/2017, 10:26 AM

## 2017-04-13 ENCOUNTER — Ambulatory Visit
Admission: RE | Admit: 2017-04-13 | Discharge: 2017-04-13 | Disposition: A | Discharge status: Home / Self Care | Payer: Medicare Other | Source: Ambulatory Visit | Attending: Hematology and Oncology | Admitting: Hematology and Oncology

## 2017-04-13 ENCOUNTER — Other Ambulatory Visit: Payer: Self-pay

## 2017-04-13 ENCOUNTER — Telehealth: Payer: Self-pay | Admitting: *Deleted

## 2017-04-13 ENCOUNTER — Other Ambulatory Visit: Payer: Self-pay | Admitting: *Deleted

## 2017-04-13 ENCOUNTER — Inpatient Hospital Stay (HOSPITAL_BASED_OUTPATIENT_CLINIC_OR_DEPARTMENT_OTHER): Payer: Medicare Other | Admitting: Hematology and Oncology

## 2017-04-13 ENCOUNTER — Inpatient Hospital Stay: Payer: Medicare Other | Attending: Hematology and Oncology

## 2017-04-13 VITALS — BP 112/66 | HR 86 | Temp 96.7°F | Resp 18 | Wt 139.1 lb

## 2017-04-13 DIAGNOSIS — Z66 Do not resuscitate: Secondary | ICD-10-CM | POA: Insufficient documentation

## 2017-04-13 DIAGNOSIS — I700 Atherosclerosis of aorta: Secondary | ICD-10-CM | POA: Insufficient documentation

## 2017-04-13 DIAGNOSIS — C3411 Malignant neoplasm of upper lobe, right bronchus or lung: Secondary | ICD-10-CM

## 2017-04-13 DIAGNOSIS — I998 Other disorder of circulatory system: Secondary | ICD-10-CM | POA: Diagnosis not present

## 2017-04-13 DIAGNOSIS — Y842 Radiological procedure and radiotherapy as the cause of abnormal reaction of the patient, or of later complication, without mention of misadventure at the time of the procedure: Secondary | ICD-10-CM | POA: Insufficient documentation

## 2017-04-13 DIAGNOSIS — M069 Rheumatoid arthritis, unspecified: Secondary | ICD-10-CM

## 2017-04-13 DIAGNOSIS — I820 Budd-Chiari syndrome: Secondary | ICD-10-CM | POA: Diagnosis not present

## 2017-04-13 DIAGNOSIS — C7951 Secondary malignant neoplasm of bone: Secondary | ICD-10-CM | POA: Insufficient documentation

## 2017-04-13 DIAGNOSIS — D721 Eosinophilia: Secondary | ICD-10-CM

## 2017-04-13 DIAGNOSIS — Z79899 Other long term (current) drug therapy: Secondary | ICD-10-CM | POA: Diagnosis not present

## 2017-04-13 DIAGNOSIS — R590 Localized enlarged lymph nodes: Secondary | ICD-10-CM

## 2017-04-13 DIAGNOSIS — Z5112 Encounter for antineoplastic immunotherapy: Secondary | ICD-10-CM | POA: Insufficient documentation

## 2017-04-13 DIAGNOSIS — Z7901 Long term (current) use of anticoagulants: Secondary | ICD-10-CM

## 2017-04-13 DIAGNOSIS — Z808 Family history of malignant neoplasm of other organs or systems: Secondary | ICD-10-CM | POA: Insufficient documentation

## 2017-04-13 DIAGNOSIS — K5641 Fecal impaction: Secondary | ICD-10-CM | POA: Diagnosis not present

## 2017-04-13 DIAGNOSIS — C787 Secondary malignant neoplasm of liver and intrahepatic bile duct: Secondary | ICD-10-CM | POA: Diagnosis not present

## 2017-04-13 DIAGNOSIS — Z8701 Personal history of pneumonia (recurrent): Secondary | ICD-10-CM | POA: Diagnosis not present

## 2017-04-13 DIAGNOSIS — Z87891 Personal history of nicotine dependence: Secondary | ICD-10-CM

## 2017-04-13 DIAGNOSIS — J984 Other disorders of lung: Secondary | ICD-10-CM | POA: Insufficient documentation

## 2017-04-13 DIAGNOSIS — J700 Acute pulmonary manifestations due to radiation: Secondary | ICD-10-CM | POA: Insufficient documentation

## 2017-04-13 DIAGNOSIS — L237 Allergic contact dermatitis due to plants, except food: Secondary | ICD-10-CM | POA: Insufficient documentation

## 2017-04-13 DIAGNOSIS — Z801 Family history of malignant neoplasm of trachea, bronchus and lung: Secondary | ICD-10-CM | POA: Insufficient documentation

## 2017-04-13 DIAGNOSIS — J90 Pleural effusion, not elsewhere classified: Secondary | ICD-10-CM | POA: Diagnosis not present

## 2017-04-13 DIAGNOSIS — Z7189 Other specified counseling: Secondary | ICD-10-CM

## 2017-04-13 LAB — CBC WITH DIFFERENTIAL/PLATELET
Basophils Absolute: 0.2 10*3/uL — ABNORMAL HIGH (ref 0–0.1)
Basophils Relative: 2 %
Eosinophils Absolute: 0.4 10*3/uL (ref 0–0.7)
Eosinophils Relative: 5 %
HCT: 38.5 % — ABNORMAL LOW (ref 40.0–52.0)
Hemoglobin: 13.3 g/dL (ref 13.0–18.0)
Lymphocytes Relative: 21 %
Lymphs Abs: 1.6 10*3/uL (ref 1.0–3.6)
MCH: 32.9 pg (ref 26.0–34.0)
MCHC: 34.6 g/dL (ref 32.0–36.0)
MCV: 95.2 fL (ref 80.0–100.0)
Monocytes Absolute: 1 10*3/uL (ref 0.2–1.0)
Monocytes Relative: 14 %
Neutro Abs: 4.3 10*3/uL (ref 1.4–6.5)
Neutrophils Relative %: 58 %
Platelets: 180 10*3/uL (ref 150–440)
RBC: 4.05 MIL/uL — ABNORMAL LOW (ref 4.40–5.90)
RDW: 12.8 % (ref 11.5–14.5)
WBC: 7.5 10*3/uL (ref 3.8–10.6)

## 2017-04-13 LAB — PROTIME-INR
INR: 1.1
Prothrombin Time: 14.2 seconds (ref 11.4–15.2)

## 2017-04-13 LAB — APTT: aPTT: 32 seconds (ref 24–36)

## 2017-04-13 MED ORDER — IOPAMIDOL (ISOVUE-300) INJECTION 61%
100.0000 mL | Freq: Once | INTRAVENOUS | Status: AC | PRN
  Administered 2017-04-13: 100 mL via INTRAVENOUS

## 2017-04-13 MED ORDER — RIVAROXABAN 15 MG PO TABS: 15.0000 mg | ORAL_TABLET | Freq: Two times a day (BID) | ORAL | 0 refills | Status: DC

## 2017-04-13 NOTE — Telephone Encounter (Signed)
Called patient and discussed need for patient to come to the cancer center today for labs and sample pack of xarelto. To be started on xarelto for hepatic vein clot, voiced understanding. appt made.

## 2017-04-13 NOTE — Progress Notes (Signed)
[facility] day:  04/13/2017   Chief Complaint: Gerald [name] is a 66 y.o. male with metastatic lung cancer with associated hypereosinophilia who is seen for acute hepatic vein thrombosis.  HPI:  The patient was last seen in the medical oncology clinic by me on 03/27/2017.  At that time, he was doing well.  He denied any complaint.  He received cycle #12 Keytruda.  He received Xgeva.  He was scheduled for restaging studies.  Chest, abdomen, and pelvic CT on 04/13/2017 revealed new left hepatic vein thrombosis. This somewhat obscured the known left hepatic lobe tumor although overall the left hepatic lobe masses were thought to be similar in size compared the prior exam. The segment 8 lesion was less well seen than on the prior exam.  The central necrotic portion was seen but the peripheral rim was much less conspicuous (could represent improvement in this tumor).  There was mild increase in adenopathy in the gastrohepatic ligament.  There was essentially stable appearance in the lungs with considerable radiation pneumonitis, scarring, and volume loss in the right lung, along with a trace loculated pleural effusion with enhancing margins.  There was stable appearance of prior splenic infarcts and prior scarring in the left mid kidney.  During the interim, he has felt "okay".  He has felt tight on his right side (places his hand over his right upper quadrant) sees. He denies any "real pain". He is been working on his truck and bending over.   Past Medical History:  Diagnosis Date  . Arthritis    Rheumatoid arthritis  . Cancer ([place])    lung ca   . Collagen vascular disease ([facility])    RA  . Mass of lung   . Pneumothorax    spontaneous  . Renal disorder    as a child    Past Surgical History:  Procedure Laterality Date  . FLEXIBLE BRONCHOSCOPY N/A 03/30/2016   Procedure: FLEXIBLE BRONCHOSCOPY;  Surgeon: Vilinda Boehringer, MD;  Location: [facility] ORS;   Service: Cardiopulmonary;  Laterality: N/A;  . Ureter repair as a child      Family History  Problem Relation Age of Onset  . Brain cancer Mother   . Lung cancer Maternal Uncle   . Cancer Sister     Metastatic cancer- unknown primary    Social History:  reports that he quit smoking about 16 years ago. His smoking use included Cigarettes. He has a 13.00 pack-year smoking history. He has never used smokeless tobacco. He reports that he drinks alcohol. He reports that he does not use drugs.  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  He lives in [place].  Contact number is [phone].  He has a great pyrenees.  He has been working on his truck.  The patient is alone today.  Allergies:  Allergies  Allergen Reactions  . Nsaids Other (See Comments)    Pt is unable to take this class of medications.    . Sulfa Antibiotics Other (See Comments)    Reaction:  GI upset     Current Medications: Current Outpatient Prescriptions  Medication Sig Dispense Refill  . calcium-vitamin D (OSCAL WITH D) 500-200 MG-UNIT tablet Take 1 tablet by mouth 2 (two) times daily. 086 tablet 0  . folic acid (FOLVITE) 1 MG tablet Take 1 tablet (1 mg total) by mouth daily. 30 tablet 3  . gabapentin (NEURONTIN)  300 MG capsule Take 1 capsule (300 mg total) by mouth at bedtime. 30 capsule 2  . HYDROcodone-acetaminophen (NORCO) 7.5-325 MG tablet Take 1 tablet by mouth every 4 (four) hours as needed for moderate pain (Every 4-6 hours as needed for pain). 30 tablet 0  . ondansetron (ZOFRAN) 8 MG tablet Take 1 tablet (8 mg total) by mouth 2 (two) times daily as needed for nausea or vomiting. 20 tablet 0  . oxyCODONE (OXY IR/ROXICODONE) 5 MG immediate release tablet Take 1 tablet (5 mg total) by mouth every 4 (four) hours as needed for severe pain. 30 tablet 0  . oxyCODONE-acetaminophen (PERCOCET/ROXICET) 5-325 MG tablet Take 1 tablet by mouth every 6 (six) hours as  needed for severe pain. 30 tablet 0  . sucralfate (CARAFATE) 1 g tablet Take 1 tablet (1 g total) by mouth 3 (three) times daily. Dissolve in 2-3 tbsp warm water, swish and swallow. 90 tablet 3   No current facility-administered medications for this visit.     Review of Systems:  GENERAL:  Feels "ok". No fever, chills or sweats.  Weight down 1 pound. PERFORMANCE STATUS (ECOG):  2 HEENT: No visual changes, sore throat, mouth sores or tenderness. Lungs:  No shortness of breath or cough. No hemoptysis. Cardiac:  No chest pain, palpitations, orthopnea, or PND.  Sleeps in a recliner. GI:  Appetite good.  Intermittent tight sensation in RUQ.  No nausea, vomiting, diarrhea, constipation, melena or hematochezia. GU:  No urgency, frequency, dysuria, or hematuria. Musculoskeletal:  Chronic right knee pain. No back pain.  No muscle tenderness. Extremities:  No pain or swelling. Skin:  No rashes or skin changes. Neuro:  Neuropathy (stable) on gabapentin.  No headache, focal weakness, balance or coordination issues. Endocrine:  No diabetes, thyroid issues, hot flashes or night sweats. Psych:  No mood changes, depression or anxiety. Pain:  No pain.  Review of systems:  All other systems reviewed and found to be negative.  Physical Exam: Blood pressure 112/66, pulse 86, temperature (!) 96.7 F (35.9 C), temperature source Tympanic, resp. rate 18, weight 139 lb 1 oz (63.1 kg). GENERAL:  Thin gentleman sitting comfortably in the exam room in no acute distress. MENTAL STATUS:  Alert and oriented to person, place and time.  HEAD:Wearing a tan Evil Monkey cap. Long gray hair in pony tail. [name]. Temporal wasting. Normocephalic, atraumatic, face symmetric, no Cushingoid features. EYES:Blue eyes. Pupils equal round and reactive to light and accomodation. No conjunctivitis or scleral icterus. QMG:[id number] clear without lesion. Tonguenormal. Mucous membranes moist. RESPIRATORY:Clear  to auscultationwithout rales, wheezes or rhonchi. CARDIOVASCULAR:Regular rate andrhythmwithout murmur, rub or gallop. ABDOMEN:Soft, non-tender, with active bowel sounds and no hepatosplenomegaly. No guarding or rebound tenderness.  No masses. SKIN: No rashes, ulcers or lesions. EXTREMITIES: No edema, no skin discoloration or tenderness. No palpable cords. LYMPHNODES: No palpable cervical, supraclavicular, axillary or inguinal adenopathy  NEUROLOGICAL: Unremarkable. PSYCH: Appropriate.    Imaging studies: 03/27/2016:  Chest CT angiogram revealed an 8 cm mass in the medial right upper lobe. The mass abutted the mediastinum and possibly invaded the left atrium. There was associated widespread thoracic adenopathy. There was a 2.6 cm enhancing lesion in the medial segment of the left hepatic lobe. 04/05/2016:  PET scan revealed intense hypermetabolism (SUV 17.95) associated with a 7.5 cm central perihilar right lung lesion. There was mediastinal (sub-carinal node SUV 8.89; 2.6 cm right paratracheal node SUV 17.49) and upper abdominal retroperitoneal hypermetabolic adenopathy (9 mm aortocaval node SUV 9.2). There were  2 liver metastasis (2.3 cm left lobe SUV 6.19; 2.2 cm segment VIII SUV 5.18). There are multifocal bone metastasis (C2 vertebral body SUV 9.0; 1.6 cm left sacral ala lesion SUV of 8.6).  03/31/2016:  Head MRI was indeterminate for early metastatic disease to the brain. There was a small focus of gyral enhancement in the anterior right frontal lobe likely is post ischemic, while a small posterior right cerebellar enhancing lesion was more suspicious for small brain metastasis. There were multiple small primarily subacute appearing infarcts in the bilateral MCA and left PICA territories.  04/07/2016:  Bone scan revealed a subtle distal right femur lesion which might be a small metastasis.  Bone scan was felt to be an unreliable modality for evaluation of osseous metastatic  disease as the bone metastases seen on the recent PET and MRI were not evident. 05/12/2016:  Head MRI revealed expected interval evolution of bilateral cerebral in cerebellar infarcts. There was resolution of right frontal and right cerebellar enhancement consistent with subacute infarcts. There was a new 3 mm focus of gyral enhancement in the left occipital lobe and a possible new 4 mm enhancing lesion in the left cerebellum (? vascular enhancement versus tiny metastasis).  08/16/2016:  Head MRI revealed multiple areas of improving enhancement and restricted diffusion compatible with resolving subacute infarcts.  There was no evidence of metastatic disease or acute infarction  10/19/2016:  Chest, abdomen, and pelvic CT scan revealed radiation changes in the right lung.  The superimposed right lung pneumonia had improved.  The 6.4 x 2.4 cm right perihilar mass had decreased.  There was improving thoracic lymphadenopathy.  There was progression of hepatic metastases, measuring up to 4.7 cm.  Specifically, there was a 3.9 x 3.7 cm metastasis in segment 8 (previously 2.5 x 2.8 cm) and a 4.7 x 4.0 cm metastasis in segment 4B (previously 3.1 x 2.6 cm). 11/14/2016:  PET scan revealed 2 liver masses. Both were significantly more hypermetabolic than on 28/76/8115 and have enlarged compared to the prior PET-CT.  The segment 8 lesion was centrally necrotic but with high activity along its periphery (7.4), and about the same size as it was on 10/19/2016. The lateral segment left hepatic lobe lesion had enlarged (5.9 x 5.3 cm) compared to 10/19/16 (4.1 x 4.4 cm).  The bony metastatic lesions were no longer hypermetabolic and have resolved.  There were post therapy and postoperative findings in the right lung, with a considerable amount of suspected radiation pneumonitis and radiation fibrosis. 12/29/2016:  Chest, abdomen, and pelvic CT revealed mild reduction in size and central necrosis of the hepatic metastatic  lesions.  There was continued post therapy related findings along the right hilar region. Dominant right perihilar mass was reduced in thickness compared to 10/19/2016.  04/13/2017:  Chest, abdomen, and pelvic CT revealed new left hepatic vein thrombosis. This somewhat obscured the known left hepatic lobe tumor although overall the left hepatic lobe masses were thought to be similar in size compared the prior exam. The segment 8 lesion was less well seen than on the prior exam.  The central necrotic portion was seen but the peripheral rim was much less conspicuous (could represent improvement in this tumor).  There was mild increase in adenopathy in the gastrohepatic ligament.  There was essentially stable appearance in the lungs with considerable radiation pneumonitis, scarring, and volume loss in the right lung, along with a trace loculated pleural effusion with enhancing margins.  There was stable appearance of prior splenic infarcts and  prior scarring in the left mid kidney.   Orders Only on 04/13/2017  Component Date Value Ref Range Status  . WBC 04/13/2017 7.5  3.8 - 10.6 K/uL Final  . RBC 04/13/2017 4.05* 4.40 - 5.90 MIL/uL Final  . Hemoglobin 04/13/2017 13.3  13.0 - 18.0 g/dL Final  . HCT 04/13/2017 38.5* 40.0 - 52.0 % Final  . MCV 04/13/2017 95.2  80.0 - 100.0 fL Final  . MCH 04/13/2017 32.9  26.0 - 34.0 pg Final  . MCHC 04/13/2017 34.6  32.0 - 36.0 g/dL Final  . RDW 04/13/2017 12.8  11.5 - 14.5 % Final  . Platelets 04/13/2017 180  150 - 440 K/uL Final  . Neutrophils Relative % 04/13/2017 58  % Final  . Neutro Abs 04/13/2017 4.3  1.4 - 6.5 K/uL Final  . Lymphocytes Relative 04/13/2017 21  % Final  . Lymphs Abs 04/13/2017 1.6  1.0 - 3.6 K/uL Final  . Monocytes Relative 04/13/2017 14  % Final  . Monocytes Absolute 04/13/2017 1.0  0.2 - 1.0 K/uL Final  . Eosinophils Relative 04/13/2017 5  % Final  . Eosinophils Absolute 04/13/2017 0.4  0 - 0.7 K/uL Final  . Basophils Relative 04/13/2017  2  % Final  . Basophils Absolute 04/13/2017 0.2* 0 - 0.1 K/uL Final    Assessment:  Gerald [name] is a 66 y.o. male with stage IV adenocarcinoma of the lung and associated leukocytosis with eosinophilia. He presented with a 30 pound weight loss, shortness of breath, cough, and left leg discomfort. He has a 13-15 pack year smoking history.  Bronchoscopy on 03/30/2016 revealed an endobronchial lesion in the RUL anterior segment with 95% occlusion. There was extrinsic compression in the right middle lobe secondary to posterior mass effect. Pathology confirmed non-small cell lung cancer, favor adenocarcinoma.  PD-L1 testing revealed high expression (> 50%).  EGFR, ALK, and ROS1 were negative.  CEA was 2.3 on 03/27/2016.  PET scan on 04/05/2016 revealed intense hypermetabolism (SUV 17.95) associated with a 7.5 cm central perihilar right lung lesion. There was mediastinal (sub-carinal node SUV 8.89; 2.6 cm right paratracheal node SUV 17.49) and upper abdominal retroperitoneal hypermetabolic adenopathy (9 mm aortocaval node SUV 9.2). There were 2 liver metastasis (2.3 cm left lobe SUV 6.19; 2.2 cm segment VIII SUV 5.18). There are multifocal bone metastasis (C2 vertebral body SUV 9.0; 1.6 cm left sacral ala lesion SUV of 8.6).   He had marked leukocytosis with hypereosinophilia. Initial WBC was 72,300 with 53% eosinophils. Bone marrow on 03/29/2016 revealed marked increase in marrow eosinophils (approximate 30%). There was no diagnostic morphologic evidence of a myeloproliferative or lymphoid neoplasm. Flow cytometry revealed significant increase in eosinophils (54%). There was relative decreased myeloid cells with no significant immunophenotypic abnormalities or increase in blasts. There was no lymphoid abnormalities or evidence of clonality. FISH studies were negative for myeloproliferative neoplasms with eosinophilia (PDGFRA, PDGFRB, FGFR1). BCR-ABL was negative on 03/28/2016.  He has left leg  discomfort likely secondary to a peripheral neuropathy caused by his eosinophilia.  Left lower extremity duplex on 03/25/2016 revealed no evidence of DVT. Discomfort improved on Neurontin 300 mg a day and continues to improve with declining eosinophilia.  He received 41.4 Gy from 04/11/2016 - 05/23/2016.  He received 4 weeks of concurrent carboplatin and Taxol (04/17/2016 - 05/22/2016).    He has received 12 cycles of Keytruda (07/03/2016 - 10/12/2016; 11/22/2016 - 03/27/2017).  He has tolerated treatment well.  He was diagnosed with aspiration pneumonia on 06/16/2016.  He  received 10 days of Levaquin.  He completed a course of Augmentin.  He receives [place] every 6 weeks (began 05/22/2016; last 03/27/2017).  Chest, abdomen, and pelvic CT on 04/13/2017 revealed new left hepatic vein thrombosis. The left hepatic lobe masses were thought to be similar in size compared the prior exam. The segment 8 lesion was less well seen than on the prior exam.  The central necrotic portion was seen but the peripheral rim was much less conspicuous (could represent improvement in this tumor).  There was mild increase in adenopathy in the gastrohepatic ligament.  There was essentially stable appearance in the lungs with considerable radiation pneumonitis, scarring, and volume loss in the right lung, along with a trace loculated pleural effusion with enhancing margins.  There was stable appearance of prior splenic infarcts and prior scarring in the left mid kidney.  Symptomatically, he notes intermittent RUQ tightness.  Imaging reveals left hepatic vein thrombosis.  Code status is DNR/DNI.  Plan: 1.  Review CT scans.  No evidence of disease progression.  Significance of mild increase in adenopathy in the gastrohepatic ligament unclear and possibly related to thrombosis.  Discuss ongoing surveillance imaging. 2.  Discuss hepatic vein thrombosis.  Discuss need for life long anti-coagulation. Discuss treatment options  including Lovenox daily, Lovenox with conversion to Coumadin, or anti-Xa drugs (Eliquis or Xarelto).  Discuss pros and cons of each option.  After some discussion, decision made to pursue Xarelto.  Samples provided.  Prescription written.  3.  Begin Xarelto 15 mg BID x 3 weeks then 20 mg a day. 4.  RTC as previously scheduled.   Lequita Asal, MD  04/13/2017, 2:42 PM

## 2017-04-13 NOTE — Progress Notes (Signed)
Patient acute add on today regarding hepatic vein thrombosis.

## 2017-04-14 ENCOUNTER — Encounter: Payer: Self-pay | Admitting: Hematology and Oncology

## 2017-04-14 DIAGNOSIS — I820 Budd-Chiari syndrome: Secondary | ICD-10-CM | POA: Insufficient documentation

## 2017-04-15 ENCOUNTER — Other Ambulatory Visit: Payer: Self-pay | Admitting: Hematology and Oncology

## 2017-04-15 NOTE — Progress Notes (Deleted)
[facility]  [phone] [phone]  ID: Marcelo Ickes Maske OB: 04/08/1951  MR#: [id number]  GYJ#:[id number]  Patient Care Team: Patient, No Pcp Per as PCP - General (General Practice) Noreene Filbert, MD as Referring Physician (Radiation Oncology) Vilinda Boehringer, MD (Inactive) as Consulting Physician (Internal Medicine)  CHIEF COMPLAINT: ***  INTERVAL HISTORY: ***  Clinic day:  04/13/2017   Chief Complaint: Gerald [name] is a 66 y.o. male with metastatic lung cancer with associated hypereosinophilia who is seen for acute hepatic vein thrombosis.  HPI:  The patient was last seen in the medical oncology clinic by me on 03/27/2017.  At that time, he was doing well.  He denied any complaint.  He received cycle #12 Keytruda.  He received Xgeva.  He was scheduled for restaging studies.  Chest, abdomen, and pelvic CT on 04/13/2017 revealed new left hepatic vein thrombosis. This somewhat obscured the known left hepatic lobe tumor although overall the left hepatic lobe masses were thought to be similar in size compared the prior exam. The segment 8 lesion was less well seen than on the prior exam.  The central necrotic portion was seen but the peripheral rim was much less conspicuous (could represent improvement in this tumor).  There was mild increase in adenopathy in the gastrohepatic ligament.  There was essentially stable appearance in the lungs with considerable radiation pneumonitis, scarring, and volume loss in the right lung, along with a trace loculated pleural effusion with enhancing margins.  There was stable appearance of prior splenic infarcts and prior scarring in the left mid kidney.  During the interim, he has felt "okay".  He has felt tight on his right side (places his hand over his right upper quadrant) sees. He denies any "real pain". He is been working on his truck and bending over.  REVIEW OF SYSTEMS:   ROS  As per HPI. Otherwise, a  complete review of systems is negative.  PAST MEDICAL HISTORY: Past Medical History:  Diagnosis Date  . Arthritis    Rheumatoid arthritis  . Cancer ([place])    lung ca   . Collagen vascular disease (H[facility])    RA  . Mass of lung   . Pneumothorax    spontaneous  . Renal disorder    as a child    PAST SURGICAL HISTORY: Past Surgical History:  Procedure Laterality Date  . FLEXIBLE BRONCHOSCOPY N/A 03/30/2016   Procedure: FLEXIBLE BRONCHOSCOPY;  Surgeon: Vilinda Boehringer, MD;  Location: [facility] ORS;  Service: Cardiopulmonary;  Laterality: N/A;  . Ureter repair as a child      FAMILY HISTORY: Family History  Problem Relation Age of Onset  . Brain cancer Mother   . Lung cancer Maternal Uncle   . Cancer Sister     Metastatic cancer- unknown primary    ADVANCED DIRECTIVES (Y/N):  N  HEALTH MAINTENANCE: Social History  Substance Use Topics  . Smoking status: Former Smoker    Packs/day: 0.50    Years: 26.00    Types: Cigarettes    Quit date: 06/08/2000  . Smokeless tobacco: Never Used     Comment: Quit about 5 years ago  . Alcohol use 0.0 oz/week     Comment: occasionally beer     Colonoscopy:  PAP:  Bone density:  Lipid panel:  Allergies  Allergen Reactions  . Nsaids Other (See Comments)    Pt is unable to take this class of medications.    . Sulfa Antibiotics Other (See Comments)  Reaction:  GI upset     Current Outpatient Prescriptions  Medication Sig Dispense Refill  . calcium-vitamin D (OSCAL WITH D) 500-200 MG-UNIT tablet Take 1 tablet by mouth 2 (two) times daily. 180 tablet 0  . folic acid (FOLVITE) 1 MG tablet Take 1 tablet (1 mg total) by mouth daily. 30 tablet 3  . gabapentin (NEURONTIN) 300 MG capsule Take 1 capsule (300 mg total) by mouth at bedtime. 30 capsule 2  . HYDROcodone-acetaminophen (NORCO) 7.5-325 MG tablet Take 1 tablet by mouth every 4 (four) hours as needed for moderate pain (Every 4-6 hours as needed for pain). 30 tablet 0  . ondansetron  (ZOFRAN) 8 MG tablet Take 1 tablet (8 mg total) by mouth 2 (two) times daily as needed for nausea or vomiting. 20 tablet 0  . oxyCODONE (OXY IR/ROXICODONE) 5 MG immediate release tablet Take 1 tablet (5 mg total) by mouth every 4 (four) hours as needed for severe pain. 30 tablet 0  . oxyCODONE-acetaminophen (PERCOCET/ROXICET) 5-325 MG tablet Take 1 tablet by mouth every 6 (six) hours as needed for severe pain. 30 tablet 0  . Rivaroxaban (XARELTO) 15 MG TABS tablet Take 1 tablet (15 mg total) by mouth 2 (two) times daily with a meal. 24 tablet 0  . sucralfate (CARAFATE) 1 g tablet Take 1 tablet (1 g total) by mouth 3 (three) times daily. Dissolve in 2-3 tbsp warm water, swish and swallow. 90 tablet 3   No current facility-administered medications for this visit.     OBJECTIVE: There were no vitals filed for this visit.   There is no height or weight on file to calculate BMI.    ECOG FS:{CHL ONC [id number]  General: Well-developed, well-nourished, no acute distress. Eyes: Pink conjunctiva, anicteric sclera. HEENT: Normocephalic, moist mucous membranes, clear oropharnyx. Lungs: Clear to auscultation bilaterally. Heart: Regular rate and rhythm. No rubs, murmurs, or gallops. Abdomen: Soft, nontender, nondistended. No organomegaly noted, normoactive bowel sounds. Musculoskeletal: No edema, cyanosis, or clubbing. Neuro: Alert, answering all questions appropriately. Cranial nerves grossly intact. Skin: No rashes or petechiae noted. Psych: Normal affect. Lymphatics: No cervical, calvicular, axillary or inguinal LAD.   LAB RESULTS:  Lab Results  Component Value Date   NA 136 03/27/2017   K 4.6 03/27/2017   CL 103 03/27/2017   CO2 29 03/27/2017   GLUCOSE 83 03/27/2017   BUN 21 (H) 03/27/2017   CREATININE 0.80 03/27/2017   CALCIUM 9.7 03/27/2017   PROT 8.5 (H) 03/27/2017   ALBUMIN 4.0 03/27/2017   AST 61 (H) 03/27/2017   ALT 50 03/27/2017   ALKPHOS 87 03/27/2017   BILITOT 0.5  03/27/2017   GFRNONAA >60 03/27/2017   GFRAA >60 03/27/2017    Lab Results  Component Value Date   WBC 7.5 04/13/2017   NEUTROABS 4.3 04/13/2017   HGB 13.3 04/13/2017   HCT 38.5 (L) 04/13/2017   MCV 95.2 04/13/2017   PLT 180 04/13/2017     STUDIES: Ct Chest W Contrast  Result Date: 04/13/2017 CLINICAL DATA:  Metastatic right upper lobe lung cancer, prior chemotherapy and radiation therapy. Restaging. EXAM: CT CHEST, ABDOMEN, AND PELVIS WITH CONTRAST TECHNIQUE: Multidetector CT imaging of the chest, abdomen and pelvis was performed following the standard protocol during bolus administration of intravenous contrast. CONTRAST:  ISOVUE-300 IOPAMIDOL (ISOVUE-300) INJECTION 61% COMPARISON:  12/29/2016 FINDINGS: CT CHEST FINDINGS Cardiovascular: Unremarkable Mediastinum/Nodes: Stable mild stranding and indistinctness along mediastinal fat planes is specially adjacent to the azygos vein and right perihilar region. Rightward  shift of cardiac and mediastinal structures due to right hemithoracic volume loss. Lungs/Pleura: Similar appearance of right paramediastinal and perihilar airspace opacities likely primarily related to radiation pneumonitis, also with volume loss and airspace opacity posteriorly near the right lung apex. Bandlike scarring in the right lung apex unchanged. The right perihilar mass measures 1.8 cm on image 25/2, stable, and was not appreciably hypermetabolic on the PET-CT of 11/14/2016. Trace loculated right pleural effusion with enhancing pleural margins shown on image 26/2, slightly smaller than the than on the prior exam, internal fluid density measuring 2.2 by 0.7 cm on image 26/2. Stable bulla at the left lung apex with adjacent volume loss or scarring. Musculoskeletal: Unremarkable CT ABDOMEN PELVIS FINDINGS Hepatobiliary: New abnormal defect in the left hepatic vein on images 51 through 54 series 2 with resulting accentuated enhancement in the left hepatic lobe, the  appearance is compatible with left hepatic vein thrombosis. There is also mild intrahepatic biliary dilatation in the left hepatic lobe. Because of the degree of hepatic heterogeneity related to the suspected hepatic vein thrombosis, it is difficult to measure the left hepatic lobe metastatic lesion, which is primarily in segment 2, but generally the size is thought to be similar to the prior exam. The previous segment 8 lesion is reduced in conspicuity. A triangular a hypodense lesion measuring 2.1 by 1.5 cm is observed but the peripheral rounded lesion with hypoenhancement is not well seen on today' s exam. Accordingly the overall lesion size seems smaller. A small inferior lesion in segment 3 of the liver measures 1.1 by 0.9 cm on image 71/2, stable. There is some transient hepatic attenuation difference in segment 6 on image 61/2. This is essentially stable from prior. Pancreas: Unremarkable Spleen: Similar appearance of peripheral splenic irregularities favoring prior splenic infarcts, and a small fluid collection along a prior suspected splenic infarct shown on image 64/2. Overall the spleen is not changed. Adrenals/Urinary Tract: Stable scarring in the medial left mid kidney with calyceal diverticulum noted. Continued slightly irregular urinary bladder wall thickening posteriorly with cellules, but without a well-defined mass. Stomach/Bowel: Prominent stool throughout the colon favors constipation. Vascular/Lymphatic: Aortoiliac atherosclerotic vascular disease. Gastrohepatic lymph nodes are present measure up to 9 mm in short axis on image 59/2, mild increase in prominence compared to the prior exam. Reproductive: Unremarkable Other: No supplemental non-categorized findings. Musculoskeletal: No significant bony lesion is identified. Attention is specially directed to sites of prior hypermetabolic activity including the left sacrum and left iliac bone above the acetabulum, but no associated lesion is seen.  IMPRESSION: 1. There is new left hepatic vein thrombosis. This results in accentuated early phase enhancement in the left liver due to poor drainage. This also somewhat obscures the known left hepatic lobe tumor although overall the left hepatic lobe masses are thought to be similar in size compared the prior exam. 2. The segment 8 lesion is less well seen than on the prior exam. The central necrotic portion is seen but the peripheral rim is much less conspicuous. The significance of this is uncertain, but it could represent improvement in this tumor. 3. Mild increase in adenopathy in the gastrohepatic ligament. 4. Essentially appearance in the lungs with considerable radiation pneumonitis, scarring, and volume loss in the right lung, along with a trace loculated pleural effusion with enhancing margins. 5. Stable appearance of prior splenic infarcts and prior scarring in the left mid kidney. 6.  Prominent stool throughout the colon favors constipation. 7.  Aortic Atherosclerosis (ICD10-I70.0). Impression #1 will  be called to the ordering clinician or representative by the Radiologist Assistant, and communication documented in the PACS or zVision Dashboard. Electronically Signed   By: Van Clines M.D.   On: 04/13/2017 10:29   Ct Abdomen Pelvis W Contrast  Result Date: 04/13/2017 CLINICAL DATA:  Metastatic right upper lobe lung cancer, prior chemotherapy and radiation therapy. Restaging. EXAM: CT CHEST, ABDOMEN, AND PELVIS WITH CONTRAST TECHNIQUE: Multidetector CT imaging of the chest, abdomen and pelvis was performed following the standard protocol during bolus administration of intravenous contrast. CONTRAST:  141m ISOVUE-300 IOPAMIDOL (ISOVUE-300) INJECTION 61% COMPARISON:  12/29/2016 FINDINGS: CT CHEST FINDINGS Cardiovascular: Unremarkable Mediastinum/Nodes: Stable mild stranding and indistinctness along mediastinal fat planes is specially adjacent to the azygos vein and right perihilar region.  Rightward shift of cardiac and mediastinal structures due to right hemithoracic volume loss. Lungs/Pleura: Similar appearance of right paramediastinal and perihilar airspace opacities likely primarily related to radiation pneumonitis, also with volume loss and airspace opacity posteriorly near the right lung apex. Bandlike scarring in the right lung apex unchanged. The right perihilar mass measures 1.8 cm on image 25/2, stable, and was not appreciably hypermetabolic on the PET-CT of 11/14/2016. Trace loculated right pleural effusion with enhancing pleural margins shown on image 26/2, slightly smaller than the than on the prior exam, internal fluid density measuring 2.2 by 0.7 cm on image 26/2. Stable bulla at the left lung apex with adjacent volume loss or scarring. Musculoskeletal: Unremarkable CT ABDOMEN PELVIS FINDINGS Hepatobiliary: New abnormal defect in the left hepatic vein on images 51 through 54 series 2 with resulting accentuated enhancement in the left hepatic lobe, the appearance is compatible with left hepatic vein thrombosis. There is also mild intrahepatic biliary dilatation in the left hepatic lobe. Because of the degree of hepatic heterogeneity related to the suspected hepatic vein thrombosis, it is difficult to measure the left hepatic lobe metastatic lesion, which is primarily in segment 2, but generally the size is thought to be similar to the prior exam. The previous segment 8 lesion is reduced in conspicuity. A triangular a hypodense lesion measuring 2.1 by 1.5 cm is observed but the peripheral rounded lesion with hypoenhancement is not well seen on today' s exam. Accordingly the overall lesion size seems smaller. A small inferior lesion in segment 3 of the liver measures 1.1 by 0.9 cm on image 71/2, stable. There is some transient hepatic attenuation difference in segment 6 on image 61/2. This is essentially stable from prior. Pancreas: Unremarkable Spleen: Similar appearance of peripheral  splenic irregularities favoring prior splenic infarcts, and a small fluid collection along a prior suspected splenic infarct shown on image 64/2. Overall the spleen is not changed. Adrenals/Urinary Tract: Stable scarring in the medial left mid kidney with calyceal diverticulum noted. Continued slightly irregular urinary bladder wall thickening posteriorly with cellules, but without a well-defined mass. Stomach/Bowel: Prominent stool throughout the colon favors constipation. Vascular/Lymphatic: Aortoiliac atherosclerotic vascular disease. Gastrohepatic lymph nodes are present measure up to 9 mm in short axis on image 59/2, mild increase in prominence compared to the prior exam. Reproductive: Unremarkable Other: No supplemental non-categorized findings. Musculoskeletal: No significant bony lesion is identified. Attention is specially directed to sites of prior hypermetabolic activity including the left sacrum and left iliac bone above the acetabulum, but no associated lesion is seen. IMPRESSION: 1. There is new left hepatic vein thrombosis. This results in accentuated early phase enhancement in the left liver due to poor drainage. This also somewhat obscures the known left hepatic lobe  tumor although overall the left hepatic lobe masses are thought to be similar in size compared the prior exam. 2. The segment 8 lesion is less well seen than on the prior exam. The central necrotic portion is seen but the peripheral rim is much less conspicuous. The significance of this is uncertain, but it could represent improvement in this tumor. 3. Mild increase in adenopathy in the gastrohepatic ligament. 4. Essentially appearance in the lungs with considerable radiation pneumonitis, scarring, and volume loss in the right lung, along with a trace loculated pleural effusion with enhancing margins. 5. Stable appearance of prior splenic infarcts and prior scarring in the left mid kidney. 6.  Prominent stool throughout the colon favors  constipation. 7.  Aortic Atherosclerosis (ICD10-I70.0). Impression #1 will be called to the ordering clinician or representative by the Radiologist Assistant, and communication documented in the PACS or zVision Dashboard. Electronically Signed   By: Van Clines M.D.   On: 04/13/2017 10:29    Assessment:  Gerald [name] is a 66 y.o. male with stage IV adenocarcinoma of the lung and associated leukocytosis with eosinophilia. He presented with a 30 pound weight loss, shortness of breath, cough, and left leg discomfort. He has a 13-15 pack year smoking history.  Bronchoscopy on 03/30/2016 revealed an endobronchial lesion in the RUL anterior segment with 95% occlusion. There was extrinsic compression in the right middle lobe secondary to posterior mass effect. Pathology confirmed non-small cell lung cancer, favor adenocarcinoma.  PD-L1 testing revealed high expression (> 50%).  EGFR, ALK, and ROS1 were negative.  CEA was 2.3 on 03/27/2016.  PET scan on 04/05/2016 revealed intense hypermetabolism (SUV 17.95) associated with a 7.5 cm central perihilar right lung lesion. There was mediastinal (sub-carinal node SUV 8.89; 2.6 cm right paratracheal node SUV 17.49) and upper abdominal retroperitoneal hypermetabolic adenopathy (9 mm aortocaval node SUV 9.2). There were 2 liver metastasis (2.3 cm left lobe SUV 6.19; 2.2 cm segment VIII SUV 5.18). There are multifocal bone metastasis (C2 vertebral body SUV 9.0; 1.6 cm left sacral ala lesion SUV of 8.6).   He had marked leukocytosis with hypereosinophilia. Initial WBC was 72,300 with 53% eosinophils. Bone marrow on 03/29/2016 revealed marked increase in marrow eosinophils (approximate 30%). There was no diagnostic morphologic evidence of a myeloproliferative or lymphoid neoplasm. Flow cytometry revealed significant increase in eosinophils (54%). There was relative decreased myeloid cells with no significant immunophenotypic abnormalities or increase in  blasts. There was no lymphoid abnormalities or evidence of clonality. FISH studies were negative for myeloproliferative neoplasms with eosinophilia (PDGFRA, PDGFRB, FGFR1). BCR-ABL was negative on 03/28/2016.  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  Left lower extremity duplex on 03/25/2016 revealed no evidence of DVT. Discomfort improved on Neurontin 300 mg a day and continues to improve with declining eosinophilia.  He received 41.4 Gy from 04/11/2016 - 05/23/2016.  He received 4 weeks of concurrent carboplatin and Taxol (04/17/2016 - 05/22/2016).    He has received 12 cycles of Keytruda (07/03/2016 - 10/12/2016; 11/22/2016 - 03/27/2017).  He has tolerated treatment well.  He was diagnosed with aspiration pneumonia on 06/16/2016.  He received 10 days of Levaquin.  He completed a course of Augmentin.  He receives [place] every 6 weeks (began 05/22/2016; last 03/27/2017).  Chest, abdomen, and pelvic CT on 04/13/2017 revealed new left hepatic vein thrombosis. The left hepatic lobe masses were thought to be similar in size compared the prior exam. The segment 8 lesion  was less well seen than on the prior exam.  The central necrotic portion was seen but the peripheral rim was much less conspicuous (could represent improvement in this tumor).  There was mild increase in adenopathy in the gastrohepatic ligament.  There was essentially stable appearance in the lungs with considerable radiation pneumonitis, scarring, and volume loss in the right lung, along with a trace loculated pleural effusion with enhancing margins.  There was stable appearance of prior splenic infarcts and prior scarring in the left mid kidney.  Symptomatically, he notes intermittent RUQ tightness.  Imaging reveals left hepatic vein thrombosis.  Code status is DNR/DNI.  Plan: 1.  Review CT scans.  No evidence of disease progression.  Significance of mild increase in  adenopathy in the gastrohepatic ligament unclear and possibly related to thrombosis.  Discuss ongoing surveillance imaging. 2.  Discuss hepatic vein thrombosis.  Discuss need for life long anti-coagulation. Discuss treatment options including Lovenox daily, Lovenox with conversion to Coumadin, or anti-Xa drugs (Eliquis or Xarelto).  Discuss pros and cons of each option.  After some discussion, decision made to pursue Xarelto.  Samples provided.  Prescription written.  3.  Begin Xarelto 15 mg BID x 3 weeks then 20 mg a day. 4.  RTC as previously scheduled.  Patient expressed understanding and was in agreement with this plan. He also understands that He can call clinic at any time with any questions, concerns, or complaints.   Cancer Staging Primary cancer of right upper lobe of lung ([place]) Staging form: Lung, AJCC 7th Edition - Clinical stage from 03/30/2016: Stage IV (T4, N3, M1b) - Signed by Lequita Asal, MD on 04/09/2016 - Pathologic: No stage assigned - Unsigned - Pathologic: No stage assigned - Unsigned   Lloyd Huger, MD   04/15/2017 6:43 PM

## 2017-04-17 ENCOUNTER — Inpatient Hospital Stay: Payer: Medicare Other

## 2017-04-17 ENCOUNTER — Inpatient Hospital Stay: Payer: Medicare Other | Admitting: Oncology

## 2017-04-24 ENCOUNTER — Encounter: Payer: Self-pay | Admitting: Hematology and Oncology

## 2017-04-24 ENCOUNTER — Inpatient Hospital Stay: Payer: Medicare Other

## 2017-04-24 ENCOUNTER — Inpatient Hospital Stay (HOSPITAL_BASED_OUTPATIENT_CLINIC_OR_DEPARTMENT_OTHER): Payer: Medicare Other | Admitting: Hematology and Oncology

## 2017-04-24 ENCOUNTER — Telehealth: Payer: Self-pay | Admitting: *Deleted

## 2017-04-24 ENCOUNTER — Other Ambulatory Visit: Payer: Self-pay

## 2017-04-24 ENCOUNTER — Other Ambulatory Visit: Payer: Self-pay | Admitting: *Deleted

## 2017-04-24 VITALS — BP 121/67 | HR 89 | Temp 97.2°F | Wt 136.6 lb

## 2017-04-24 DIAGNOSIS — Z808 Family history of malignant neoplasm of other organs or systems: Secondary | ICD-10-CM

## 2017-04-24 DIAGNOSIS — C3411 Malignant neoplasm of upper lobe, right bronchus or lung: Secondary | ICD-10-CM

## 2017-04-24 DIAGNOSIS — I820 Budd-Chiari syndrome: Secondary | ICD-10-CM | POA: Diagnosis not present

## 2017-04-24 DIAGNOSIS — Z66 Do not resuscitate: Secondary | ICD-10-CM

## 2017-04-24 DIAGNOSIS — Z5112 Encounter for antineoplastic immunotherapy: Secondary | ICD-10-CM

## 2017-04-24 DIAGNOSIS — Z7189 Other specified counseling: Secondary | ICD-10-CM

## 2017-04-24 DIAGNOSIS — L237 Allergic contact dermatitis due to plants, except food: Secondary | ICD-10-CM | POA: Diagnosis not present

## 2017-04-24 DIAGNOSIS — Z87891 Personal history of nicotine dependence: Secondary | ICD-10-CM

## 2017-04-24 DIAGNOSIS — R590 Localized enlarged lymph nodes: Secondary | ICD-10-CM | POA: Diagnosis not present

## 2017-04-24 DIAGNOSIS — C7951 Secondary malignant neoplasm of bone: Secondary | ICD-10-CM | POA: Diagnosis not present

## 2017-04-24 DIAGNOSIS — Z8701 Personal history of pneumonia (recurrent): Secondary | ICD-10-CM

## 2017-04-24 DIAGNOSIS — C787 Secondary malignant neoplasm of liver and intrahepatic bile duct: Secondary | ICD-10-CM

## 2017-04-24 DIAGNOSIS — I998 Other disorder of circulatory system: Secondary | ICD-10-CM | POA: Diagnosis not present

## 2017-04-24 DIAGNOSIS — M069 Rheumatoid arthritis, unspecified: Secondary | ICD-10-CM | POA: Diagnosis not present

## 2017-04-24 DIAGNOSIS — D721 Eosinophilia: Secondary | ICD-10-CM

## 2017-04-24 DIAGNOSIS — Z79899 Other long term (current) drug therapy: Secondary | ICD-10-CM

## 2017-04-24 DIAGNOSIS — Z7901 Long term (current) use of anticoagulants: Secondary | ICD-10-CM

## 2017-04-24 DIAGNOSIS — Z85118 Personal history of other malignant neoplasm of bronchus and lung: Secondary | ICD-10-CM

## 2017-04-24 DIAGNOSIS — Z801 Family history of malignant neoplasm of trachea, bronchus and lung: Secondary | ICD-10-CM

## 2017-04-24 LAB — CBC WITH DIFFERENTIAL/PLATELET
Basophils Absolute: 0.2 10*3/uL — ABNORMAL HIGH (ref 0–0.1)
Basophils Relative: 2 %
Eosinophils Absolute: 0.8 10*3/uL — ABNORMAL HIGH (ref 0–0.7)
Eosinophils Relative: 10 %
HCT: 38.6 % — ABNORMAL LOW (ref 40.0–52.0)
Hemoglobin: 13.3 g/dL (ref 13.0–18.0)
Lymphocytes Relative: 20 %
Lymphs Abs: 1.6 10*3/uL (ref 1.0–3.6)
MCH: 32.9 pg (ref 26.0–34.0)
MCHC: 34.6 g/dL (ref 32.0–36.0)
MCV: 95 fL (ref 80.0–100.0)
Monocytes Absolute: 1.3 10*3/uL — ABNORMAL HIGH (ref 0.2–1.0)
Monocytes Relative: 16 %
Neutro Abs: 4.2 10*3/uL (ref 1.4–6.5)
Neutrophils Relative %: 52 %
Platelets: 213 10*3/uL (ref 150–440)
RBC: 4.06 MIL/uL — ABNORMAL LOW (ref 4.40–5.90)
RDW: 13.2 % (ref 11.5–14.5)
WBC: 8 10*3/uL (ref 3.8–10.6)

## 2017-04-24 LAB — COMPREHENSIVE METABOLIC PANEL
ALT: 57 U/L (ref 17–63)
AST: 73 U/L — ABNORMAL HIGH (ref 15–41)
Albumin: 3.8 g/dL (ref 3.5–5.0)
Alkaline Phosphatase: 75 U/L (ref 38–126)
Anion gap: 4 — ABNORMAL LOW (ref 5–15)
BUN: 21 mg/dL — ABNORMAL HIGH (ref 6–20)
CO2: 25 mmol/L (ref 22–32)
Calcium: 9.4 mg/dL (ref 8.9–10.3)
Chloride: 107 mmol/L (ref 101–111)
Creatinine, Ser: 0.8 mg/dL (ref 0.61–1.24)
GFR calc Af Amer: >60 mL/min (ref >60)
GFR calc non Af Amer: >60 mL/min (ref >60)
Glucose, Bld: 108 mg/dL — ABNORMAL HIGH (ref 65–99)
Potassium: 3.6 mmol/L (ref 3.5–5.1)
Sodium: 136 mmol/L (ref 135–145)
Total Bilirubin: 0.6 mg/dL (ref 0.3–1.2)
Total Protein: 8.2 g/dL — ABNORMAL HIGH (ref 6.5–8.1)

## 2017-04-24 LAB — MAGNESIUM: Magnesium: 1.9 mg/dL (ref 1.7–2.4)

## 2017-04-24 LAB — TSH: TSH: 3.999 u[IU]/mL (ref 0.350–4.500)

## 2017-04-24 MED ORDER — SODIUM CHLORIDE 0.9 % IV SOLN
Freq: Once | INTRAVENOUS | Status: AC
  Administered 2017-04-24: 12:00:00 via INTRAVENOUS
  Filled 2017-04-24: qty 1000

## 2017-04-24 MED ORDER — SODIUM CHLORIDE 0.9 % IV SOLN
200.0000 mg | Freq: Once | INTRAVENOUS | Status: AC
  Administered 2017-04-24: 200 mg via INTRAVENOUS
  Filled 2017-04-24: qty 8

## 2017-04-24 NOTE — Telephone Encounter (Signed)
Obtained Xarelto 15 mg samples from pharmacy.  Patient to take 2 times daily.  Advised patient on his way out to leave [facility] a message regarding the foundation helping with his medication.  Patient has a rx for xarelto but it was going to cost him $400.  Patient verbalized understanding.

## 2017-04-24 NOTE — Progress Notes (Signed)
[facility] day:  04/24/2017   Chief Complaint: [name] is a 66 y.o. male with stage IV adenocarcinoma of the lung with associated hypereosinophilia who is seen for assessment prior to cycle #13 Keytruda.  HPI:  The patient was last seen in the medical oncology clinic by me on 04/13/2017.  At that time, he was seen for management of acute hepatic vein thrombosis.  Restaging studies on 04/13/2017 had revealed no evidence of disease progression.  There was mild increase in adenopathy in the gastrohepatic ligament of unclear significance and possibly related to thrombosis.  He began treatment with Xarelto 15 mg BID for new diagnosis of hepatic vein thrombosis.  During the interim,   Past Medical History:  Diagnosis Date  . Arthritis    Rheumatoid arthritis  . Cancer ([place])    lung ca   . Collagen vascular disease ([facility])    RA  . Mass of lung   . Pneumothorax    spontaneous  . Renal disorder    as a child    Past Surgical History:  Procedure Laterality Date  . FLEXIBLE BRONCHOSCOPY N/A 03/30/2016   Procedure: FLEXIBLE BRONCHOSCOPY;  Surgeon: Vilinda Boehringer, MD;  Location: [facility] ORS;  Service: Cardiopulmonary;  Laterality: N/A;  . Ureter repair as a child      Family History  Problem Relation Age of Onset  . Brain cancer Mother   . Lung cancer Maternal Uncle   . Cancer Sister        Metastatic cancer- unknown primary    Social History:  reports that he quit smoking about 16 years ago. His smoking use included Cigarettes. He has a 13.00 pack-year smoking history. He has never used smokeless tobacco. He reports that he drinks alcohol. He reports that he does not use drugs.  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  He lives in [place].  Contact number is [phone].  He has a great pyrenees.  He has been working on his truck.  The patient is alone today.  Allergies:  Allergies   Allergen Reactions  . Nsaids Other (See Comments)    Pt is unable to take this class of medications.    . Sulfa Antibiotics Other (See Comments)    Reaction:  GI upset     Current Medications: Current Outpatient Prescriptions  Medication Sig Dispense Refill  . calcium-vitamin D (OSCAL WITH D) 500-200 MG-UNIT tablet Take 1 tablet by mouth 2 (two) times daily. 638 tablet 0  . folic acid (FOLVITE) 1 MG tablet Take 1 tablet (1 mg total) by mouth daily. 30 tablet 3  . gabapentin (NEURONTIN) 300 MG capsule Take 1 capsule (300 mg total) by mouth at bedtime. 30 capsule 2  . HYDROcodone-acetaminophen (NORCO) 7.5-325 MG tablet Take 1 tablet by mouth every 4 (four) hours as needed for moderate pain (Every 4-6 hours as needed for pain). 30 tablet 0  . ondansetron (ZOFRAN) 8 MG tablet Take 1 tablet (8 mg total) by mouth 2 (two) times daily as needed for nausea or vomiting. 20 tablet 0  . oxyCODONE (OXY IR/ROXICODONE) 5 MG immediate release tablet Take 1 tablet (5 mg total) by mouth every 4 (four) hours as needed for severe pain. 30 tablet 0  . oxyCODONE-acetaminophen (PERCOCET/ROXICET) 5-325 MG tablet Take 1 tablet by mouth every 6 (six) hours as needed for severe pain. 30 tablet 0  . Rivaroxaban (  XARELTO) 15 MG TABS tablet Take 1 tablet (15 mg total) by mouth 2 (two) times daily with a meal. 24 tablet 0  . sucralfate (CARAFATE) 1 g tablet Take 1 tablet (1 g total) by mouth 3 (three) times daily. Dissolve in 2-3 tbsp warm water, swish and swallow. 90 tablet 3   No current facility-administered medications for this visit.     Review of Systems:  GENERAL:  Feels "ok". No fever, chills or sweats.  Weight down 1 pound. PERFORMANCE STATUS (ECOG):  2 HEENT: No visual changes, sore throat, mouth sores or tenderness. Lungs:  No shortness of breath or cough. No hemoptysis. Cardiac:  No chest pain, palpitations, orthopnea, or PND.  Sleeps in a recliner. GI:  Appetite good.  Intermittent tight sensation in  RUQ.  No nausea, vomiting, diarrhea, constipation, melena or hematochezia. GU:  No urgency, frequency, dysuria, or hematuria. Musculoskeletal:  Chronic right knee pain. No back pain.  No muscle tenderness. Extremities:  No pain or swelling. Skin:  No rashes or skin changes. Neuro:  Neuropathy (stable) on gabapentin.  No headache, focal weakness, balance or coordination issues. Endocrine:  No diabetes, thyroid issues, hot flashes or night sweats. Psych:  No mood changes, depression or anxiety. Pain:  No pain.  Review of systems:  All other systems reviewed and found to be negative.  Physical Exam: There were no vitals taken for this visit. GENERAL:  Thin gentleman sitting comfortably in the exam room in no acute distress. MENTAL STATUS:  Alert and oriented to person, place and time.  HEAD:Wearing a tan Evil Monkey cap. Long gray hair in pony tail. [name]. Temporal wasting. Normocephalic, atraumatic, face symmetric, no Cushingoid features. EYES:Blue eyes. Pupils equal round and reactive to light and accomodation. No conjunctivitis or scleral icterus. RDE:YCXKGYJEHU clear without lesion. Tonguenormal. Mucous membranes moist. RESPIRATORY:Clear to auscultationwithout rales, wheezes or rhonchi. CARDIOVASCULAR:Regular rate andrhythmwithout murmur, rub or gallop. ABDOMEN:Soft, non-tender, with active bowel sounds and no hepatosplenomegaly. No guarding or rebound tenderness.  No masses. SKIN: No rashes, ulcers or lesions. EXTREMITIES: No edema, no skin discoloration or tenderness. No palpable cords. LYMPHNODES: No palpable cervical, supraclavicular, axillary or inguinal adenopathy  NEUROLOGICAL: Unremarkable. PSYCH: Appropriate.    Imaging studies: 03/27/2016:  Chest CT angiogram revealed an 8 cm mass in the medial right upper lobe. The mass abutted the mediastinum and possibly invaded the left atrium. There was associated widespread thoracic adenopathy. There  was a 2.6 cm enhancing lesion in the medial segment of the left hepatic lobe. 04/05/2016:  PET scan revealed intense hypermetabolism (SUV 17.95) associated with a 7.5 cm central perihilar right lung lesion. There was mediastinal (sub-carinal node SUV 8.89; 2.6 cm right paratracheal node SUV 17.49) and upper abdominal retroperitoneal hypermetabolic adenopathy (9 mm aortocaval node SUV 9.2). There were 2 liver metastasis (2.3 cm left lobe SUV 6.19; 2.2 cm segment VIII SUV 5.18). There are multifocal bone metastasis (C2 vertebral body SUV 9.0; 1.6 cm left sacral ala lesion SUV of 8.6).  03/31/2016:  Head MRI was indeterminate for early metastatic disease to the brain. There was a small focus of gyral enhancement in the anterior right frontal lobe likely is post ischemic, while a small posterior right cerebellar enhancing lesion was more suspicious for small brain metastasis. There were multiple small primarily subacute appearing infarcts in the bilateral MCA and left PICA territories.  04/07/2016:  Bone scan revealed a subtle distal right femur lesion which might be a small metastasis.  Bone scan was felt to be  an unreliable modality for evaluation of osseous metastatic disease as the bone metastases seen on the recent PET and MRI were not evident. 05/12/2016:  Head MRI revealed expected interval evolution of bilateral cerebral in cerebellar infarcts. There was resolution of right frontal and right cerebellar enhancement consistent with subacute infarcts. There was a new 3 mm focus of gyral enhancement in the left occipital lobe and a possible new 4 mm enhancing lesion in the left cerebellum (? vascular enhancement versus tiny metastasis).  08/16/2016:  Head MRI revealed multiple areas of improving enhancement and restricted diffusion compatible with resolving subacute infarcts.  There was no evidence of metastatic disease or acute infarction  10/19/2016:  Chest, abdomen, and pelvic CT scan revealed  radiation changes in the right lung.  The superimposed right lung pneumonia had improved.  The 6.4 x 2.4 cm right perihilar mass had decreased.  There was improving thoracic lymphadenopathy.  There was progression of hepatic metastases, measuring up to 4.7 cm.  Specifically, there was a 3.9 x 3.7 cm metastasis in segment 8 (previously 2.5 x 2.8 cm) and a 4.7 x 4.0 cm metastasis in segment 4B (previously 3.1 x 2.6 cm). 11/14/2016:  PET scan revealed 2 liver masses. Both were significantly more hypermetabolic than on 99/24/2683 and have enlarged compared to the prior PET-CT.  The segment 8 lesion was centrally necrotic but with high activity along its periphery (7.4), and about the same size as it was on 10/19/2016. The lateral segment left hepatic lobe lesion had enlarged (5.9 x 5.3 cm) compared to 10/19/16 (4.1 x 4.4 cm).  The bony metastatic lesions were no longer hypermetabolic and have resolved.  There were post therapy and postoperative findings in the right lung, with a considerable amount of suspected radiation pneumonitis and radiation fibrosis. 12/29/2016:  Chest, abdomen, and pelvic CT revealed mild reduction in size and central necrosis of the hepatic metastatic lesions.  There was continued post therapy related findings along the right hilar region. Dominant right perihilar mass was reduced in thickness compared to 10/19/2016.  04/13/2017:  Chest, abdomen, and pelvic CT revealed new left hepatic vein thrombosis. This somewhat obscured the known left hepatic lobe tumor although overall the left hepatic lobe masses were thought to be similar in size compared the prior exam. The segment 8 lesion was less well seen than on the prior exam.  The central necrotic portion was seen but the peripheral rim was much less conspicuous (could represent improvement in this tumor).  There was mild increase in adenopathy in the gastrohepatic ligament.  There was essentially stable appearance in the lungs with  considerable radiation pneumonitis, scarring, and volume loss in the right lung, along with a trace loculated pleural effusion with enhancing margins.  There was stable appearance of prior splenic infarcts and prior scarring in the left mid kidney.   No visits with results within 3 Day(s) from this visit.  Latest known visit with results is:  Orders Only on 04/13/2017  Component Date Value Ref Range Status  . WBC 04/13/2017 7.5  3.8 - 10.6 K/uL Final  . RBC 04/13/2017 4.05* 4.40 - 5.90 MIL/uL Final  . Hemoglobin 04/13/2017 13.3  13.0 - 18.0 g/dL Final  . HCT 04/13/2017 38.5* 40.0 - 52.0 % Final  . MCV 04/13/2017 95.2  80.0 - 100.0 fL Final  . MCH 04/13/2017 32.9  26.0 - 34.0 pg Final  . MCHC 04/13/2017 34.6  32.0 - 36.0 g/dL Final  . RDW 04/13/2017 12.8  11.5 - 14.5 %  Final  . Platelets 04/13/2017 180  150 - 440 K/uL Final  . Neutrophils Relative % 04/13/2017 58  % Final  . Neutro Abs 04/13/2017 4.3  1.4 - 6.5 K/uL Final  . Lymphocytes Relative 04/13/2017 21  % Final  . Lymphs Abs 04/13/2017 1.6  1.0 - 3.6 K/uL Final  . Monocytes Relative 04/13/2017 14  % Final  . Monocytes Absolute 04/13/2017 1.0  0.2 - 1.0 K/uL Final  . Eosinophils Relative 04/13/2017 5  % Final  . Eosinophils Absolute 04/13/2017 0.4  0 - 0.7 K/uL Final  . Basophils Relative 04/13/2017 2  % Final  . Basophils Absolute 04/13/2017 0.2* 0 - 0.1 K/uL Final  . Prothrombin Time 04/13/2017 14.2  11.4 - 15.2 seconds Final  . INR 04/13/2017 1.10   Final  . aPTT 04/13/2017 32  24 - 36 seconds Final    Assessment:  Gerald [name] is a 66 y.o. male with stage IV adenocarcinoma of the lung and associated leukocytosis with eosinophilia. He presented with a 30 pound weight loss, shortness of breath, cough, and left leg discomfort. He has a 13-15 pack year smoking history.  Bronchoscopy on 03/30/2016 revealed an endobronchial lesion in the RUL anterior segment with 95% occlusion. There was extrinsic compression in the right  middle lobe secondary to posterior mass effect. Pathology confirmed non-small cell lung cancer, favor adenocarcinoma.  PD-L1 testing revealed high expression (> 50%).  EGFR, ALK, and ROS1 were negative.  CEA was 2.3 on 03/27/2016.  PET scan on 04/05/2016 revealed intense hypermetabolism (SUV 17.95) associated with a 7.5 cm central perihilar right lung lesion. There was mediastinal (sub-carinal node SUV 8.89; 2.6 cm right paratracheal node SUV 17.49) and upper abdominal retroperitoneal hypermetabolic adenopathy (9 mm aortocaval node SUV 9.2). There were 2 liver metastasis (2.3 cm left lobe SUV 6.19; 2.2 cm segment VIII SUV 5.18). There are multifocal bone metastasis (C2 vertebral body SUV 9.0; 1.6 cm left sacral ala lesion SUV of 8.6).   He had marked leukocytosis with hypereosinophilia. Initial WBC was 72,300 with 53% eosinophils. Bone marrow on 03/29/2016 revealed marked increase in marrow eosinophils (approximate 30%). There was no diagnostic morphologic evidence of a myeloproliferative or lymphoid neoplasm. Flow cytometry revealed significant increase in eosinophils (54%). There was relative decreased myeloid cells with no significant immunophenotypic abnormalities or increase in blasts. There was no lymphoid abnormalities or evidence of clonality. FISH studies were negative for myeloproliferative neoplasms with eosinophilia (PDGFRA, PDGFRB, FGFR1). BCR-ABL was negative on 03/28/2016.  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  Left lower extremity duplex on 03/25/2016 revealed no evidence of DVT. Discomfort improved on Neurontin 300 mg a day and continues to improve with declining eosinophilia.  He received 41.4 Gy from 04/11/2016 - 05/23/2016.  He received 4 weeks of concurrent carboplatin and Taxol (04/17/2016 - 05/22/2016).    He has received 12 cycles of Keytruda (07/03/2016 - 10/12/2016; 11/22/2016 - 03/27/2017).  He has tolerated  treatment well.  He was diagnosed with aspiration pneumonia on 06/16/2016.  He received 10 days of Levaquin.  He completed a course of Augmentin.  He receives [place] every 6 weeks (began 05/22/2016; last 03/27/2017).  He was diagnosed with left porta vein thrombosis on 04/13/2017.  He is on Xarelto,  Chest, abdomen, and pelvic CT on 04/13/2017 revealed new left hepatic vein thrombosis. The left hepatic lobe masses were thought to be similar in size compared the prior exam. The segment 8 lesion was  less well seen than on the prior exam.  The central necrotic portion was seen but the peripheral rim was much less conspicuous (could represent improvement in this tumor).  There was mild increase in adenopathy in the gastrohepatic ligament.  There was essentially stable appearance in the lungs with considerable radiation pneumonitis, scarring, and volume loss in the right lung, along with a trace loculated pleural effusion with enhancing margins.  There was stable appearance of prior splenic infarcts and prior scarring in the left mid kidney.  Symptomatically, he notes intermittent RUQ tightness.  Imaging reveals left hepatic vein thrombosis.  Code status is DNR/DNI.  Plan: 1.  Labs today: CBC with diff, CMP, Mg, TSH. 2.  Cycle #13 Keytruda today. 3.  Continue Xgeva every 6 weeks (alternating cycles).  Due today. 4.  Continue Xarelto 15 mg BID then begin 20 mg q day on 05/04/2017. 5.  RTC in 3 weeks for MD assessment, labs (CBC with diff, CMP, Mg, TSH), Xgeva, and cycle #14 Keytruda.   Lequita Asal, MD  04/24/2017, 4:37 AM

## 2017-04-24 NOTE — Progress Notes (Addendum)
[facility] day:  04/24/2017  Chief Complaint: Gerald [name] is a 66 y.o. male with stage IV adenocarcinoma of the lung with associated hypereosinophilia who is seen for assessment prior to cycle #13 Keytruda.  HPI:  The patient was last seen in the medical oncology clinic by me on 04/13/2017.  At that time, he was seen for management of acute hepatic vein thrombosis.  Restaging studies on 04/13/2017 had revealed no evidence of disease progression.  There was mild increase in adenopathy in the gastrohepatic ligament of unclear significance and possibly related to thrombosis.  He began treatment with Xarelto 15 mg BID for new diagnosis of hepatic vein thrombosis.  During the interim, he got poison ivy over the weekend after working out in the yard.  Skin is improving.  He denies any other complaint.  He is going to run of out of his Xarelto.  He states that he needs assistance as 24 pills cost $310.  He denies any bruising or bleeding.   Past Medical History:  Diagnosis Date  . Arthritis    Rheumatoid arthritis  . Cancer ([place])    lung ca   . Collagen vascular disease ([facility])    RA  . Mass of lung   . Pneumothorax    spontaneous  . Renal disorder    as a child    Past Surgical History:  Procedure Laterality Date  . FLEXIBLE BRONCHOSCOPY N/A 03/30/2016   Procedure: FLEXIBLE BRONCHOSCOPY;  Surgeon: Vilinda Boehringer, MD;  Location: [facility] ORS;  Service: Cardiopulmonary;  Laterality: N/A;  . Ureter repair as a child      Family History  Problem Relation Age of Onset  . Brain cancer Mother   . Lung cancer Maternal Uncle   . Cancer Sister        Metastatic cancer- unknown primary    Social History:  reports that he quit smoking about 16 years ago. His smoking use included Cigarettes. He has a 13.00 pack-year smoking history. He has never used smokeless tobacco. He reports that he drinks alcohol. He reports that he does not use drugs.  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  He lives in [place].  Contact number is [phone].  He has a great pyrenees.  He has been working on his truck.  The patient is alone today.  Allergies:  Allergies  Allergen Reactions  . Nsaids Other (See Comments)    Pt is unable to take this class of medications.    . Sulfa Antibiotics Other (See Comments)    Reaction:  GI upset     Current Medications: Current Outpatient Prescriptions  Medication Sig Dispense Refill  . calcium-vitamin D (OSCAL WITH D) 500-200 MG-UNIT tablet Take 1 tablet by mouth 2 (two) times daily. 103 tablet 0  . folic acid (FOLVITE) 1 MG tablet Take 1 tablet (1 mg total) by mouth daily. 30 tablet 3  . gabapentin (NEURONTIN) 300 MG capsule Take 1 capsule (300 mg total) by mouth at bedtime. 30 capsule 2  . HYDROcodone-acetaminophen (NORCO) 7.5-325 MG tablet Take 1 tablet by mouth every 4 (four) hours as needed for moderate pain (Every 4-6 hours as needed for pain). 30 tablet 0  . ondansetron (ZOFRAN) 8 MG tablet Take 1 tablet (8 mg total) by mouth 2 (two) times daily as needed for nausea or vomiting. 20 tablet 0  . oxyCODONE (OXY IR/ROXICODONE)  5 MG immediate release tablet Take 1 tablet (5 mg total) by mouth every 4 (four) hours as needed for severe pain. 30 tablet 0  . oxyCODONE-acetaminophen (PERCOCET/ROXICET) 5-325 MG tablet Take 1 tablet by mouth every 6 (six) hours as needed for severe pain. 30 tablet 0  . Rivaroxaban (XARELTO) 15 MG TABS tablet Take 1 tablet (15 mg total) by mouth 2 (two) times daily with a meal. 24 tablet 0  . sucralfate (CARAFATE) 1 g tablet Take 1 tablet (1 g total) by mouth 3 (three) times daily. Dissolve in 2-3 tbsp warm water, swish and swallow. (Patient not taking: Reported on 05/03/2017) 90 tablet 3  . rivaroxaban (XARELTO) 20 MG TABS tablet Take 1 tablet (20 mg total) by mouth daily with supper. 30 tablet 1   No current facility-administered  medications for this visit.     Review of Systems:  GENERAL:  Feels "good". No fever, chills or sweats.  Weight down 3 pounds. PERFORMANCE STATUS (ECOG):  2 HEENT: No visual changes, sore throat, mouth sores or tenderness. Lungs:  No shortness of breath or cough. No hemoptysis. Cardiac:  No chest pain, palpitations, orthopnea, or PND.  Sleeps in a recliner. GI:  Appetite good.  No nausea, vomiting, diarrhea, constipation, melena or hematochezia. GU:  No urgency, frequency, dysuria, or hematuria. Musculoskeletal:  Chronic right knee pain. No back pain.  No muscle tenderness. Extremities:  No pain or swelling. Skin:  Poison ivy rash, improving. Neuro:  Neuropathy (stable) on gabapentin.  No headache, focal weakness, balance or coordination issues. Endocrine:  No diabetes, thyroid issues, hot flashes or night sweats. Psych:  No mood changes, depression or anxiety. Pain:  No pain.  Review of systems:  All other systems reviewed and found to be negative.  Physical Exam: Blood pressure 121/67, pulse 89, temperature 97.2 F (36.2 C), temperature source Tympanic, weight 136 lb 9.6 oz (62 kg). GENERAL:  Thin gentleman sitting comfortably in the exam room in no acute distress. MENTAL STATUS:  Alert and oriented to person, place and time.  HEAD:Wearing a cap. Long gray hair in pony tail. Gerald [name]. Temporal wasting. Normocephalic, atraumatic, face symmetric, no Cushingoid features. EYES:Blue eyes. Pupils equal round and reactive to light and accomodation. No conjunctivitis or scleral icterus. CBS:[id number] clear without lesion. Tonguenormal. Mucous membranes moist. RESPIRATORY:Clear to auscultationwithout rales, wheezes or rhonchi. CARDIOVASCULAR:Regular rate andrhythmwithout murmur, rub or gallop. ABDOMEN:Soft, non-tender, with active bowel sounds and no hepatosplenomegaly. No guarding or rebound tenderness.  No masses. SKIN: Resolving poison ivy rash on hands (right  > left). EXTREMITIES: No edema, no skin discoloration or tenderness. No palpable cords. LYMPHNODES: No palpable cervical, supraclavicular, axillary or inguinal adenopathy  NEUROLOGICAL: Unremarkable. PSYCH: Appropriate.   Imaging studies: 03/27/2016:  Chest CT angiogram revealed an 8 cm mass in the medial right upper lobe. The mass abutted the mediastinum and possibly invaded the left atrium. There was associated widespread thoracic adenopathy. There was a 2.6 cm enhancing lesion in the medial segment of the left hepatic lobe. 04/05/2016:  PET scan revealed intense hypermetabolism (SUV 17.95) associated with a 7.5 cm central perihilar right lung lesion. There was mediastinal (sub-carinal node SUV 8.89; 2.6 cm right paratracheal node SUV 17.49) and upper abdominal retroperitoneal hypermetabolic adenopathy (9 mm aortocaval node SUV 9.2). There were 2 liver metastasis (2.3 cm left lobe SUV 6.19; 2.2 cm segment VIII SUV 5.18). There are multifocal bone metastasis (C2 vertebral body SUV 9.0; 1.6 cm left sacral ala lesion SUV of 8.6).  03/31/2016:  Head MRI was indeterminate for early metastatic disease to the brain. There was a small focus of gyral enhancement in the anterior right frontal lobe likely is post ischemic, while a small posterior right cerebellar enhancing lesion was more suspicious for small brain metastasis. There were multiple small primarily subacute appearing infarcts in the bilateral MCA and left PICA territories.  04/07/2016:  Bone scan revealed a subtle distal right femur lesion which might be a small metastasis.  Bone scan was felt to be an unreliable modality for evaluation of osseous metastatic disease as the bone metastases seen on the recent PET and MRI were not evident. 05/12/2016:  Head MRI revealed expected interval evolution of bilateral cerebral in cerebellar infarcts. There was resolution of right frontal and right cerebellar enhancement consistent with subacute  infarcts. There was a new 3 mm focus of gyral enhancement in the left occipital lobe and a possible new 4 mm enhancing lesion in the left cerebellum (? vascular enhancement versus tiny metastasis).  08/16/2016:  Head MRI revealed multiple areas of improving enhancement and restricted diffusion compatible with resolving subacute infarcts.  There was no evidence of metastatic disease or acute infarction  10/19/2016:  Chest, abdomen, and pelvic CT scan revealed radiation changes in the right lung.  The superimposed right lung pneumonia had improved.  The 6.4 x 2.4 cm right perihilar mass had decreased.  There was improving thoracic lymphadenopathy.  There was progression of hepatic metastases, measuring up to 4.7 cm.  Specifically, there was a 3.9 x 3.7 cm metastasis in segment 8 (previously 2.5 x 2.8 cm) and a 4.7 x 4.0 cm metastasis in segment 4B (previously 3.1 x 2.6 cm). 11/14/2016:  PET scan revealed 2 liver masses. Both were significantly more hypermetabolic than on 57/12/7791 and have enlarged compared to the prior PET-CT.  The segment 8 lesion was centrally necrotic but with high activity along its periphery (7.4), and about the same size as it was on 10/19/2016. The lateral segment left hepatic lobe lesion had enlarged (5.9 x 5.3 cm) compared to 10/19/16 (4.1 x 4.4 cm).  The bony metastatic lesions were no longer hypermetabolic and have resolved.  There were post therapy and postoperative findings in the right lung, with a considerable amount of suspected radiation pneumonitis and radiation fibrosis. 12/29/2016:  Chest, abdomen, and pelvic CT revealed mild reduction in size and central necrosis of the hepatic metastatic lesions.  There was continued post therapy related findings along the right hilar region. Dominant right perihilar mass was reduced in thickness compared to 10/19/2016.  04/13/2017:  Chest, abdomen, and pelvic CT revealed new left hepatic vein thrombosis. This somewhat obscured the known  left hepatic lobe tumor although overall the left hepatic lobe masses were thought to be similar in size compared the prior exam. The segment 8 lesion was less well seen than on the prior exam.  The central necrotic portion was seen but the peripheral rim was much less conspicuous (could represent improvement in this tumor).  There was mild increase in adenopathy in the gastrohepatic ligament.  There was essentially stable appearance in the lungs with considerable radiation pneumonitis, scarring, and volume loss in the right lung, along with a trace loculated pleural effusion with enhancing margins.  There was stable appearance of prior splenic infarcts and prior scarring in the left mid kidney.   Orders Only on 04/24/2017  Component Date Value Ref Range Status  . WBC 04/24/2017 8.0  3.8 - 10.6 K/uL Final  . RBC 04/24/2017 4.06* 4.40 -  5.90 MIL/uL Final  . Hemoglobin 04/24/2017 13.3  13.0 - 18.0 g/dL Final  . HCT 04/24/2017 38.6* 40.0 - 52.0 % Final  . MCV 04/24/2017 95.0  80.0 - 100.0 fL Final  . MCH 04/24/2017 32.9  26.0 - 34.0 pg Final  . MCHC 04/24/2017 34.6  32.0 - 36.0 g/dL Final  . RDW 04/24/2017 13.2  11.5 - 14.5 % Final  . Platelets 04/24/2017 213  150 - 440 K/uL Final  . Neutrophils Relative % 04/24/2017 52  % Final  . Neutro Abs 04/24/2017 4.2  1.4 - 6.5 K/uL Final  . Lymphocytes Relative 04/24/2017 20  % Final  . Lymphs Abs 04/24/2017 1.6  1.0 - 3.6 K/uL Final  . Monocytes Relative 04/24/2017 16  % Final  . Monocytes Absolute 04/24/2017 1.3* 0.2 - 1.0 K/uL Final  . Eosinophils Relative 04/24/2017 10  % Final  . Eosinophils Absolute 04/24/2017 0.8* 0 - 0.7 K/uL Final  . Basophils Relative 04/24/2017 2  % Final  . Basophils Absolute 04/24/2017 0.2* 0 - 0.1 K/uL Final  . Sodium 04/24/2017 136  135 - 145 mmol/L Final  . Potassium 04/24/2017 3.6  3.5 - 5.1 mmol/L Final  . Chloride 04/24/2017 107  101 - 111 mmol/L Final  . CO2 04/24/2017 25  22 - 32 mmol/L Final  . Glucose, Bld  04/24/2017 108* 65 - 99 mg/dL Final  . BUN 04/24/2017 21* 6 - 20 mg/dL Final  . Creatinine, Ser 04/24/2017 0.80  0.61 - 1.24 mg/dL Final  . Calcium 04/24/2017 9.4  8.9 - 10.3 mg/dL Final  . Total Protein 04/24/2017 8.2* 6.5 - 8.1 g/dL Final  . Albumin 04/24/2017 3.8  3.5 - 5.0 g/dL Final  . AST 04/24/2017 73* 15 - 41 U/L Final  . ALT 04/24/2017 57  17 - 63 U/L Final  . Alkaline Phosphatase 04/24/2017 75  38 - 126 U/L Final  . Total Bilirubin 04/24/2017 0.6  0.3 - 1.2 mg/dL Final  . GFR calc non Af Amer 04/24/2017 >60  >60 mL/min Final  . GFR calc Af Amer 04/24/2017 >60  >60 mL/min Final   Comment: (NOTE) The eGFR has been calculated using the CKD EPI equation. This calculation has not been validated in all clinical situations. eGFR's persistently <60 mL/min signify possible Chronic Kidney Disease.   . Anion gap 04/24/2017 4* 5 - 15 Final  . Magnesium 04/24/2017 1.9  1.7 - 2.4 mg/dL Final  . TSH 04/24/2017 3.999  0.350 - 4.500 uIU/mL Final   Performed by a 3rd Generation assay with a functional sensitivity of <=0.01 uIU/mL.    Assessment:  Gerald [name] is a 66 y.o. male with stage IV adenocarcinoma of the lung and associated leukocytosis with eosinophilia. He presented with a 30 pound weight loss, shortness of breath, cough, and left leg discomfort. He has a 13-15 pack year smoking history.  Bronchoscopy on 03/30/2016 revealed an endobronchial lesion in the RUL anterior segment with 95% occlusion. There was extrinsic compression in the right middle lobe secondary to posterior mass effect. Pathology confirmed non-small cell lung cancer, favor adenocarcinoma.  PD-L1 testing revealed high expression (> 50%).  EGFR, ALK, and ROS1 were negative.  CEA was 2.3 on 03/27/2016.  PET scan on 04/05/2016 revealed intense hypermetabolism (SUV 17.95) associated with a 7.5 cm central perihilar right lung lesion. There was mediastinal (sub-carinal node SUV 8.89; 2.6 cm right paratracheal node SUV  17.49) and upper abdominal retroperitoneal hypermetabolic adenopathy (9 mm aortocaval node SUV 9.2). There  were 2 liver metastasis (2.3 cm left lobe SUV 6.19; 2.2 cm segment VIII SUV 5.18). There are multifocal bone metastasis (C2 vertebral body SUV 9.0; 1.6 cm left sacral ala lesion SUV of 8.6).   He had marked leukocytosis with hypereosinophilia. Initial WBC was 72,300 with 53% eosinophils. Bone marrow on 03/29/2016 revealed marked increase in marrow eosinophils (approximate 30%). There was no diagnostic morphologic evidence of a myeloproliferative or lymphoid neoplasm. Flow cytometry revealed significant increase in eosinophils (54%). There was relative decreased myeloid cells with no significant immunophenotypic abnormalities or increase in blasts. There was no lymphoid abnormalities or evidence of clonality. FISH studies were negative for myeloproliferative neoplasms with eosinophilia (PDGFRA, PDGFRB, FGFR1). BCR-ABL was negative on 03/28/2016.  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  Left lower extremity duplex on 03/25/2016 revealed no evidence of DVT. Discomfort improved on Neurontin 300 mg a day and continues to improve with declining eosinophilia.  He received 41.4 Gy from 04/11/2016 - 05/23/2016.  He received 4 weeks of concurrent carboplatin and Taxol (04/17/2016 - 05/22/2016).    He has received 12 cycles of Keytruda (07/03/2016 - 10/12/2016; 11/22/2016 - 03/27/2017).  He has tolerated treatment well.  He was diagnosed with aspiration pneumonia on 06/16/2016.  He received 10 days of Levaquin.  He completed a course of Augmentin.  He receives [place] every 6 weeks (began 05/22/2016; last 03/27/2017).  He was diagnosed with left porta vein thrombosis on 04/13/2017.  He began Xarelto on 04/13/2017.  Chest, abdomen, and pelvic CT on 04/13/2017 revealed new left hepatic vein thrombosis. The left hepatic lobe masses were thought  to be similar in size compared the prior exam. The segment 8 lesion was less well seen than on the prior exam.  The central necrotic portion was seen but the peripheral rim was much less conspicuous (could represent improvement in this tumor).  There was mild increase in adenopathy in the gastrohepatic ligament.  There was essentially stable appearance in the lungs with considerable radiation pneumonitis, scarring, and volume loss in the right lung, along with a trace loculated pleural effusion with enhancing margins.  There was stable appearance of prior splenic infarcts and prior scarring in the left mid kidney.  Symptomatically, he is doing well.  He has a resolving poison ivy rash.  Code status is DNR/DNI.  Plan: 1.  Labs today: CBC with diff, CMP, Mg, TSH. 2.  Cycle #13 Keytruda today. 3.  Continue Xgeva every 6 weeks (alternating cycles).  Due next visit. 4.  Continue Xarelto 15 mg BID then begin 20 mg q day on 05/04/2017.  Provide assistance with obtaining medication. 5.  RTC in 3 weeks for MD assessment, labs (CBC with diff, CMP, Mg, TSH), Xgeva, and cycle #14 Keytruda.   Lequita Asal, MD  04/24/2017, 10:45 AM

## 2017-05-03 ENCOUNTER — Encounter: Payer: Self-pay | Admitting: Radiation Oncology

## 2017-05-03 ENCOUNTER — Ambulatory Visit
Admission: RE | Admit: 2017-05-03 | Discharge: 2017-05-03 | Disposition: A | Discharge status: Home / Self Care | Payer: Medicare Other | Source: Ambulatory Visit | Attending: Radiation Oncology | Admitting: Radiation Oncology

## 2017-05-03 VITALS — BP 110/70 | HR 107 | Temp 98.7°F | Resp 20 | Wt 134.3 lb

## 2017-05-03 DIAGNOSIS — Z9221 Personal history of antineoplastic chemotherapy: Secondary | ICD-10-CM | POA: Insufficient documentation

## 2017-05-03 DIAGNOSIS — Z87891 Personal history of nicotine dependence: Secondary | ICD-10-CM | POA: Diagnosis not present

## 2017-05-03 DIAGNOSIS — Z923 Personal history of irradiation: Secondary | ICD-10-CM | POA: Insufficient documentation

## 2017-05-03 DIAGNOSIS — C3411 Malignant neoplasm of upper lobe, right bronchus or lung: Secondary | ICD-10-CM

## 2017-05-03 DIAGNOSIS — Z7901 Long term (current) use of anticoagulants: Secondary | ICD-10-CM | POA: Insufficient documentation

## 2017-05-03 DIAGNOSIS — Z86718 Personal history of other venous thrombosis and embolism: Secondary | ICD-10-CM | POA: Insufficient documentation

## 2017-05-03 NOTE — Progress Notes (Signed)
Radiation Oncology Follow up Note  Name: Divine Hansley [facility]   Date:   05/03/2017 MRN:  [id number] DOB: 1951-02-17    This 66 y.o. male presents to the clinic today for 11 month follow-up status post radiation therapy for stage IV non-small cell lung cancer of the right upper lobe presenting with SVC.  REFERRING PROVIDER: No ref. provider found  HPI: Patient is a 66 year old male now out 11 months having received concurrent chemoradiation for stage IIIB non-small cell lung cancer of the right upper lobe presenting with SVC. He is seen today in routine follow-up and is doing fairly well. He is currently on Keytruda and tolerating that well. He recently had a CT scan which shows good response in the chest although does have acute hepatic vein thrombosis and is being treated with  Xarelto . He specifically denies cough hemoptysis chest tightness or any marked shortness of breath.  COMPLICATIONS OF TREATMENT: none  FOLLOW UP COMPLIANCE: keeps appointments   PHYSICAL EXAM:  BP 110/70   Pulse (!) 107   Temp 98.7 F (37.1 C)   Resp 20   Wt 134 lb 4.2 oz (60.9 kg)   BMI 18.46 kg/m  Thin slightly tachypneic male in NAD. Well-developed well-nourished patient in NAD. HEENT reveals PERLA, EOMI, discs not visualized.  Oral cavity is clear. No oral mucosal lesions are identified. Neck is clear without evidence of cervical or supraclavicular adenopathy. Lungs are clear to A&P. Cardiac examination is essentially unremarkable with regular rate and rhythm without murmur rub or thrill. Abdomen is benign with no organomegaly or masses noted. Motor sensory and DTR levels are equal and symmetric in the upper and lower extremities. Cranial nerves II through XII are grossly intact. Proprioception is intact. No peripheral adenopathy or edema is identified. No motor or sensory levels are noted. Crude visual fields are within normal range.  RADIOLOGY RESULTS: Recent CT scans are reviewed and compatible with the  above-stated findings  PLAN: Present time patient is doing well on immunotherapy. I'm please was overall progress. Not sure of the reasoning for his left portal vein thrombosis he is currently on Xarelto. I've otherwise asked to see him back in 6 months for follow-up. He continues close follow-up care with medical oncology. Would be happy to reevaluate patient at any time should further palliative treatment be indicated.  I would like to take this opportunity to thank you for allowing me to participate in the care of your patient.Armstead Peaks., MD

## 2017-05-11 ENCOUNTER — Other Ambulatory Visit: Payer: Self-pay | Admitting: *Deleted

## 2017-05-11 MED ORDER — RIVAROXABAN 20 MG PO TABS: 20.0000 mg | ORAL_TABLET | Freq: Every day | ORAL | 1 refills | Status: DC

## 2017-05-11 NOTE — Telephone Encounter (Signed)
Needs Xarelto 20 mg tabs ordered. He will be out of the 15 mg tabs over weekend He does have a coupon for 30 day supply free while waiting for app to be processed from company

## 2017-05-15 ENCOUNTER — Other Ambulatory Visit: Payer: Self-pay | Admitting: Hematology and Oncology

## 2017-05-15 ENCOUNTER — Encounter: Payer: Self-pay | Admitting: Hematology and Oncology

## 2017-05-15 ENCOUNTER — Inpatient Hospital Stay: Payer: Medicare Other

## 2017-05-15 ENCOUNTER — Inpatient Hospital Stay: Payer: Medicare Other | Attending: Hematology and Oncology | Admitting: Hematology and Oncology

## 2017-05-15 VITALS — BP 105/63 | HR 84 | Temp 97.3°F | Resp 18 | Wt 135.2 lb

## 2017-05-15 DIAGNOSIS — Z8701 Personal history of pneumonia (recurrent): Secondary | ICD-10-CM | POA: Diagnosis not present

## 2017-05-15 DIAGNOSIS — D721 Eosinophilia: Secondary | ICD-10-CM | POA: Diagnosis not present

## 2017-05-15 DIAGNOSIS — M069 Rheumatoid arthritis, unspecified: Secondary | ICD-10-CM

## 2017-05-15 DIAGNOSIS — Z87891 Personal history of nicotine dependence: Secondary | ICD-10-CM | POA: Diagnosis not present

## 2017-05-15 DIAGNOSIS — C7951 Secondary malignant neoplasm of bone: Secondary | ICD-10-CM | POA: Diagnosis not present

## 2017-05-15 DIAGNOSIS — Z5111 Encounter for antineoplastic chemotherapy: Secondary | ICD-10-CM | POA: Insufficient documentation

## 2017-05-15 DIAGNOSIS — I820 Budd-Chiari syndrome: Secondary | ICD-10-CM | POA: Diagnosis not present

## 2017-05-15 DIAGNOSIS — C787 Secondary malignant neoplasm of liver and intrahepatic bile duct: Secondary | ICD-10-CM | POA: Diagnosis not present

## 2017-05-15 DIAGNOSIS — Z79899 Other long term (current) drug therapy: Secondary | ICD-10-CM

## 2017-05-15 DIAGNOSIS — G893 Neoplasm related pain (acute) (chronic): Secondary | ICD-10-CM

## 2017-05-15 DIAGNOSIS — Z5112 Encounter for antineoplastic immunotherapy: Secondary | ICD-10-CM

## 2017-05-15 DIAGNOSIS — I899 Noninfective disorder of lymphatic vessels and lymph nodes, unspecified: Secondary | ICD-10-CM

## 2017-05-15 DIAGNOSIS — R634 Abnormal weight loss: Secondary | ICD-10-CM | POA: Insufficient documentation

## 2017-05-15 DIAGNOSIS — C3411 Malignant neoplasm of upper lobe, right bronchus or lung: Secondary | ICD-10-CM

## 2017-05-15 DIAGNOSIS — Z85118 Personal history of other malignant neoplasm of bronchus and lung: Secondary | ICD-10-CM

## 2017-05-15 LAB — COMPREHENSIVE METABOLIC PANEL
ALT: 60 U/L (ref 17–63)
AST: 69 U/L — ABNORMAL HIGH (ref 15–41)
Albumin: 3.7 g/dL (ref 3.5–5.0)
Alkaline Phosphatase: 76 U/L (ref 38–126)
Anion gap: 4 — ABNORMAL LOW (ref 5–15)
BUN: 20 mg/dL (ref 6–20)
CO2: 28 mmol/L (ref 22–32)
Calcium: 8.9 mg/dL (ref 8.9–10.3)
Chloride: 105 mmol/L (ref 101–111)
Creatinine, Ser: 0.85 mg/dL (ref 0.61–1.24)
GFR calc Af Amer: >60 mL/min (ref >60)
GFR calc non Af Amer: >60 mL/min (ref >60)
Glucose, Bld: 96 mg/dL (ref 65–99)
Potassium: 4 mmol/L (ref 3.5–5.1)
Sodium: 137 mmol/L (ref 135–145)
Total Bilirubin: 0.8 mg/dL (ref 0.3–1.2)
Total Protein: 8.2 g/dL — ABNORMAL HIGH (ref 6.5–8.1)

## 2017-05-15 LAB — CBC WITH DIFFERENTIAL/PLATELET
Basophils Absolute: 0.1 10*3/uL (ref 0–0.1)
Basophils Relative: 2 %
Eosinophils Absolute: 0.5 10*3/uL (ref 0–0.7)
Eosinophils Relative: 7 %
HCT: 38.4 % — ABNORMAL LOW (ref 40.0–52.0)
Hemoglobin: 13.3 g/dL (ref 13.0–18.0)
Lymphocytes Relative: 24 %
Lymphs Abs: 1.7 10*3/uL (ref 1.0–3.6)
MCH: 33.2 pg (ref 26.0–34.0)
MCHC: 34.7 g/dL (ref 32.0–36.0)
MCV: 95.9 fL (ref 80.0–100.0)
Monocytes Absolute: 1 10*3/uL (ref 0.2–1.0)
Monocytes Relative: 13 %
Neutro Abs: 4 10*3/uL (ref 1.4–6.5)
Neutrophils Relative %: 54 %
Platelets: 179 10*3/uL (ref 150–440)
RBC: 4.01 MIL/uL — ABNORMAL LOW (ref 4.40–5.90)
RDW: 13.1 % (ref 11.5–14.5)
WBC: 7.3 10*3/uL (ref 3.8–10.6)

## 2017-05-15 LAB — MAGNESIUM: Magnesium: 2 mg/dL (ref 1.7–2.4)

## 2017-05-15 MED ORDER — SODIUM CHLORIDE 0.9 % IV SOLN
Freq: Once | INTRAVENOUS | Status: AC
  Administered 2017-05-15: 10:00:00 via INTRAVENOUS
  Filled 2017-05-15: qty 1000

## 2017-05-15 MED ORDER — OXYCODONE HCL 5 MG PO TABS: 5.0000 mg | ORAL_TABLET | ORAL | 0 refills | Status: DC | PRN

## 2017-05-15 MED ORDER — SODIUM CHLORIDE 0.9 % IV SOLN
200.0000 mg | Freq: Once | INTRAVENOUS | Status: AC
  Administered 2017-05-15: 200 mg via INTRAVENOUS
  Filled 2017-05-15: qty 8

## 2017-05-15 MED ORDER — HEPARIN SOD (PORK) LOCK FLUSH 100 UNIT/ML IV SOLN: 500.0000 [IU] | Freq: Once | INTRAVENOUS | Status: DC | PRN

## 2017-05-15 MED ORDER — DENOSUMAB 120 MG/1.7ML ~~LOC~~ SOLN
120.0000 mg | Freq: Once | SUBCUTANEOUS | Status: AC
  Administered 2017-05-15: 120 mg via SUBCUTANEOUS
  Filled 2017-05-15: qty 1.7

## 2017-05-15 NOTE — Progress Notes (Signed)
Patient requesting refill for Oxycodone 5 mg immediate release.

## 2017-05-15 NOTE — Progress Notes (Signed)
[facility] day:  05/15/2017   Chief Complaint: Gerald [name] is a 66 y.o. male with stage IV adenocarcinoma of the lung with associated hypereosinophilia who is seen for assessment prior to cycle #14 Keytruda.  HPI:  The patient was last seen in the medical oncology clinic by me on 04/24/2017.  At that time, he was doing well.  He was recovering from poison ivy after working in the yard.  He received cycle #13 Keytruda.  TSH was normal.  During the interim, he has done well.  He notes that he received his Xarelto.  He only got a month supply.  He is working with Elease Etienne, SW.  He forgot to take his calcium.  He denies any respiratory symptoms.   Past Medical History:  Diagnosis Date  . Arthritis    Rheumatoid arthritis  . Cancer ([place])    lung ca   . Collagen vascular disease ([facility])    RA  . Mass of lung   . Pneumothorax    spontaneous  . Renal disorder    as a child    Past Surgical History:  Procedure Laterality Date  . FLEXIBLE BRONCHOSCOPY N/A 03/30/2016   Procedure: FLEXIBLE BRONCHOSCOPY;  Surgeon: Vilinda Boehringer, MD;  Location: [facility] ORS;  Service: Cardiopulmonary;  Laterality: N/A;  . Ureter repair as a child      Family History  Problem Relation Age of Onset  . Brain cancer Mother   . Lung cancer Maternal Uncle   . Cancer Sister        Metastatic cancer- unknown primary    Social History:  reports that he quit smoking about 16 years ago. His smoking use included Cigarettes. He has a 13.00 pack-year smoking history. He has never used smokeless tobacco. He reports that he drinks alcohol. He reports that he does not use drugs.  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  He lives in [place].  Contact number is [phone].  He has a great pyrenees.  He has been working on his truck.  The patient is alone today.  Allergies:  Allergies  Allergen Reactions  . Nsaids Other  (See Comments)    Pt is unable to take this class of medications.    . Sulfa Antibiotics Other (See Comments)    Reaction:  GI upset     Current Medications: Current Outpatient Prescriptions  Medication Sig Dispense Refill  . calcium-vitamin D (OSCAL WITH D) 500-200 MG-UNIT tablet Take 1 tablet by mouth 2 (two) times daily. 295 tablet 0  . folic acid (FOLVITE) 1 MG tablet Take 1 tablet (1 mg total) by mouth daily. 30 tablet 3  . gabapentin (NEURONTIN) 300 MG capsule Take 1 capsule (300 mg total) by mouth at bedtime. 30 capsule 2  . HYDROcodone-acetaminophen (NORCO) 7.5-325 MG tablet Take 1 tablet by mouth every 4 (four) hours as needed for moderate pain (Every 4-6 hours as needed for pain). 30 tablet 0  . ondansetron (ZOFRAN) 8 MG tablet Take 1 tablet (8 mg total) by mouth 2 (two) times daily as needed for nausea or vomiting. 20 tablet 0  . oxyCODONE (OXY IR/ROXICODONE) 5 MG immediate release tablet Take 1 tablet (5 mg total) by mouth every 4 (four) hours as needed for severe pain. 30 tablet 0  . oxyCODONE-acetaminophen (PERCOCET/ROXICET) 5-325 MG tablet Take 1 tablet by mouth every 6 (six) hours as needed  for severe pain. 30 tablet 0  . Rivaroxaban (XARELTO) 15 MG TABS tablet Take 1 tablet (15 mg total) by mouth 2 (two) times daily with a meal. 24 tablet 0  . rivaroxaban (XARELTO) 20 MG TABS tablet Take 1 tablet (20 mg total) by mouth daily with supper. 30 tablet 1  . sucralfate (CARAFATE) 1 g tablet Take 1 tablet (1 g total) by mouth 3 (three) times daily. Dissolve in 2-3 tbsp warm water, swish and swallow. 90 tablet 3   No current facility-administered medications for this visit.     Review of Systems:  GENERAL:  Feels "fine". No fever, chills or sweats.  Weight down 1 pound. PERFORMANCE STATUS (ECOG):  2 HEENT: No visual changes, sore throat, mouth sores or tenderness. Lungs:  No shortness of breath or cough. No hemoptysis. Cardiac:  No chest pain, palpitations, orthopnea, or PND.   Sleeps in a recliner. GI:  Appetite good.  No nausea, vomiting, diarrhea, constipation, melena or hematochezia. GU:  No urgency, frequency, dysuria, or hematuria. Musculoskeletal:  Chronic right knee pain. No back pain.  No muscle tenderness. Extremities:  No pain or swelling. Skin:  Poison ivy rash, resolved. Neuro:  Neuropathy (stable) on gabapentin.  No headache, focal weakness, balance or coordination issues. Endocrine:  No diabetes, thyroid issues, hot flashes or night sweats. Psych:  No mood changes, depression or anxiety. Pain:  No pain.  Review of systems:  All other systems reviewed and found to be negative.  Physical Exam: Blood pressure 105/63, pulse 84, temperature 97.3 F (36.3 C), temperature source Tympanic, resp. rate 18, weight 135 lb 4 oz (61.3 kg). GENERAL:  Thin gentleman sitting comfortably in the exam room in no acute distress. MENTAL STATUS:  Alert and oriented to person, place and time.  HEAD:Wearing a Evil Monkey cap. Long gray hair in pony tail. Berneice Gandy. Temporal wasting. Normocephalic, atraumatic, face symmetric, no Cushingoid features. EYES:Blue eyes. Pupils equal round and reactive to light and accomodation. No conjunctivitis or scleral icterus. HFE:[id number] clear without lesion. Tonguenormal. Mucous membranes moist. RESPIRATORY:Clear to auscultationwithout rales, wheezes or rhonchi. CARDIOVASCULAR:Regular rate andrhythmwithout murmur, rub or gallop. ABDOMEN:Soft, non-tender, with active bowel sounds and no hepatosplenomegaly. No guarding or rebound tenderness.  No masses. SKIN: Resolved poison ivy rash on hands, residual pigmentation only (right > left). EXTREMITIES: No edema, no skin discoloration or tenderness. No palpable cords. LYMPHNODES: No palpable cervical, supraclavicular, axillary or inguinal adenopathy  NEUROLOGICAL: Unremarkable. PSYCH: Appropriate.   Imaging studies: 03/27/2016:  Chest CT angiogram revealed an  8 cm mass in the medial right upper lobe. The mass abutted the mediastinum and possibly invaded the left atrium. There was associated widespread thoracic adenopathy. There was a 2.6 cm enhancing lesion in the medial segment of the left hepatic lobe. 04/05/2016:  PET scan revealed intense hypermetabolism (SUV 17.95) associated with a 7.5 cm central perihilar right lung lesion. There was mediastinal (sub-carinal node SUV 8.89; 2.6 cm right paratracheal node SUV 17.49) and upper abdominal retroperitoneal hypermetabolic adenopathy (9 mm aortocaval node SUV 9.2). There were 2 liver metastasis (2.3 cm left lobe SUV 6.19; 2.2 cm segment VIII SUV 5.18). There are multifocal bone metastasis (C2 vertebral body SUV 9.0; 1.6 cm left sacral ala lesion SUV of 8.6).  03/31/2016:  Head MRI was indeterminate for early metastatic disease to the brain. There was a small focus of gyral enhancement in the anterior right frontal lobe likely is post ischemic, while a small posterior right cerebellar enhancing lesion was more suspicious for  small brain metastasis. There were multiple small primarily subacute appearing infarcts in the bilateral MCA and left PICA territories.  04/07/2016:  Bone scan revealed a subtle distal right femur lesion which might be a small metastasis.  Bone scan was felt to be an unreliable modality for evaluation of osseous metastatic disease as the bone metastases seen on the recent PET and MRI were not evident. 05/12/2016:  Head MRI revealed expected interval evolution of bilateral cerebral in cerebellar infarcts. There was resolution of right frontal and right cerebellar enhancement consistent with subacute infarcts. There was a new 3 mm focus of gyral enhancement in the left occipital lobe and a possible new 4 mm enhancing lesion in the left cerebellum (? vascular enhancement versus tiny metastasis).  08/16/2016:  Head MRI revealed multiple areas of improving enhancement and restricted diffusion  compatible with resolving subacute infarcts.  There was no evidence of metastatic disease or acute infarction  10/19/2016:  Chest, abdomen, and pelvic CT scan revealed radiation changes in the right lung.  The superimposed right lung pneumonia had improved.  The 6.4 x 2.4 cm right perihilar mass had decreased.  There was improving thoracic lymphadenopathy.  There was progression of hepatic metastases, measuring up to 4.7 cm.  Specifically, there was a 3.9 x 3.7 cm metastasis in segment 8 (previously 2.5 x 2.8 cm) and a 4.7 x 4.0 cm metastasis in segment 4B (previously 3.1 x 2.6 cm). 11/14/2016:  PET scan revealed 2 liver masses. Both were significantly more hypermetabolic than on 35/70/1779 and have enlarged compared to the prior PET-CT.  The segment 8 lesion was centrally necrotic but with high activity along its periphery (7.4), and about the same size as it was on 10/19/2016. The lateral segment left hepatic lobe lesion had enlarged (5.9 x 5.3 cm) compared to 10/19/16 (4.1 x 4.4 cm).  The bony metastatic lesions were no longer hypermetabolic and have resolved.  There were post therapy and postoperative findings in the right lung, with a considerable amount of suspected radiation pneumonitis and radiation fibrosis. 12/29/2016:  Chest, abdomen, and pelvic CT revealed mild reduction in size and central necrosis of the hepatic metastatic lesions.  There was continued post therapy related findings along the right hilar region. Dominant right perihilar mass was reduced in thickness compared to 10/19/2016.  04/13/2017:  Chest, abdomen, and pelvic CT revealed new left hepatic vein thrombosis. This somewhat obscured the known left hepatic lobe tumor although overall the left hepatic lobe masses were thought to be similar in size compared the prior exam. The segment 8 lesion was less well seen than on the prior exam.  The central necrotic portion was seen but the peripheral rim was much less conspicuous (could  represent improvement in this tumor).  There was mild increase in adenopathy in the gastrohepatic ligament.  There was essentially stable appearance in the lungs with considerable radiation pneumonitis, scarring, and volume loss in the right lung, along with a trace loculated pleural effusion with enhancing margins.  There was stable appearance of prior splenic infarcts and prior scarring in the left mid kidney.   Appointment on 05/15/2017  Component Date Value Ref Range Status  . WBC 05/15/2017 7.3  3.8 - 10.6 K/uL Final  . RBC 05/15/2017 4.01* 4.40 - 5.90 MIL/uL Final  . Hemoglobin 05/15/2017 13.3  13.0 - 18.0 g/dL Final  . HCT 05/15/2017 38.4* 40.0 - 52.0 % Final  . MCV 05/15/2017 95.9  80.0 - 100.0 fL Final  . MCH 05/15/2017 33.2  26.0 - 34.0 pg Final  . MCHC 05/15/2017 34.7  32.0 - 36.0 g/dL Final  . RDW 05/15/2017 13.1  11.5 - 14.5 % Final  . Platelets 05/15/2017 179  150 - 440 K/uL Final  . Neutrophils Relative % 05/15/2017 54  % Final  . Neutro Abs 05/15/2017 4.0  1.4 - 6.5 K/uL Final  . Lymphocytes Relative 05/15/2017 24  % Final  . Lymphs Abs 05/15/2017 1.7  1.0 - 3.6 K/uL Final  . Monocytes Relative 05/15/2017 13  % Final  . Monocytes Absolute 05/15/2017 1.0  0.2 - 1.0 K/uL Final  . Eosinophils Relative 05/15/2017 7  % Final  . Eosinophils Absolute 05/15/2017 0.5  0 - 0.7 K/uL Final  . Basophils Relative 05/15/2017 2  % Final  . Basophils Absolute 05/15/2017 0.1  0 - 0.1 K/uL Final  . Sodium 05/15/2017 137  135 - 145 mmol/L Final  . Potassium 05/15/2017 4.0  3.5 - 5.1 mmol/L Final  . Chloride 05/15/2017 105  101 - 111 mmol/L Final  . CO2 05/15/2017 28  22 - 32 mmol/L Final  . Glucose, Bld 05/15/2017 96  65 - 99 mg/dL Final  . BUN 05/15/2017 20  6 - 20 mg/dL Final  . Creatinine, Ser 05/15/2017 0.85  0.61 - 1.24 mg/dL Final  . Calcium 05/15/2017 8.9  8.9 - 10.3 mg/dL Final  . Total Protein 05/15/2017 8.2* 6.5 - 8.1 g/dL Final  . Albumin 05/15/2017 3.7  3.5 - 5.0 g/dL Final   . AST 05/15/2017 69* 15 - 41 U/L Final  . ALT 05/15/2017 60  17 - 63 U/L Final  . Alkaline Phosphatase 05/15/2017 76  38 - 126 U/L Final  . Total Bilirubin 05/15/2017 0.8  0.3 - 1.2 mg/dL Final  . GFR calc non Af Amer 05/15/2017 >60  >60 mL/min Final  . GFR calc Af Amer 05/15/2017 >60  >60 mL/min Final   Comment: (NOTE) The eGFR has been calculated using the CKD EPI equation. This calculation has not been validated in all clinical situations. eGFR's persistently <60 mL/min signify possible Chronic Kidney Disease.   . Anion gap 05/15/2017 4* 5 - 15 Final  . Magnesium 05/15/2017 2.0  1.7 - 2.4 mg/dL Final    Assessment:  Gerald [name] is a 66 y.o. male with stage IV adenocarcinoma of the lung and associated leukocytosis with eosinophilia. He presented with a 30 pound weight loss, shortness of breath, cough, and left leg discomfort. He has a 13-15 pack year smoking history.  Bronchoscopy on 03/30/2016 revealed an endobronchial lesion in the RUL anterior segment with 95% occlusion. There was extrinsic compression in the right middle lobe secondary to posterior mass effect. Pathology confirmed non-small cell lung cancer, favor adenocarcinoma.  PD-L1 testing revealed high expression (> 50%).  EGFR, ALK, and ROS1 were negative.  CEA was 2.3 on 03/27/2016.  PET scan on 04/05/2016 revealed intense hypermetabolism (SUV 17.95) associated with a 7.5 cm central perihilar right lung lesion. There was mediastinal (sub-carinal node SUV 8.89; 2.6 cm right paratracheal node SUV 17.49) and upper abdominal retroperitoneal hypermetabolic adenopathy (9 mm aortocaval node SUV 9.2). There were 2 liver metastasis (2.3 cm left lobe SUV 6.19; 2.2 cm segment VIII SUV 5.18). There are multifocal bone metastasis (C2 vertebral body SUV 9.0; 1.6 cm left sacral ala lesion SUV of 8.6).   He had marked leukocytosis with hypereosinophilia. Initial WBC was 72,300 with 53% eosinophils. Bone marrow on 03/29/2016 revealed  marked increase in marrow eosinophils (approximate 30%).  There was no diagnostic morphologic evidence of a myeloproliferative or lymphoid neoplasm. Flow cytometry revealed significant increase in eosinophils (54%). There was relative decreased myeloid cells with no significant immunophenotypic abnormalities or increase in blasts. There was no lymphoid abnormalities or evidence of clonality. FISH studies were negative for myeloproliferative neoplasms with eosinophilia (PDGFRA, PDGFRB, FGFR1). BCR-ABL was negative on 03/28/2016.  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  Left lower extremity duplex on 03/25/2016 revealed no evidence of DVT. Discomfort improved on Neurontin 300 mg a day and continues to improve with declining eosinophilia.  He received 41.4 Gy from 04/11/2016 - 05/23/2016.  He received 4 weeks of concurrent carboplatin and Taxol (04/17/2016 - 05/22/2016).    He has received 13 cycles of Keytruda (07/03/2016 - 10/12/2016; 11/22/2016 - 04/24/2017).  He has tolerated treatment well.  He was diagnosed with aspiration pneumonia on 06/16/2016.  He received 10 days of Levaquin.  He completed a course of Augmentin.  He receives [place] every 6 weeks (began 05/22/2016; last 03/27/2017).  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  He began Xarelto on 04/13/2017.  Chest, abdomen, and pelvic CT on 04/13/2017 revealed new left hepatic vein thrombosis. The left hepatic lobe masses were thought to be similar in size compared the prior exam. The segment 8 lesion was less well seen than on the prior exam.  The central necrotic portion was seen but the peripheral rim was much less conspicuous (could represent improvement in this tumor).  There was mild increase in adenopathy in the gastrohepatic ligament.  There was essentially stable appearance in the lungs with considerable radiation pneumonitis, scarring, and volume loss  in the right lung, along with a trace loculated pleural effusion with enhancing margins.  There was stable appearance of prior splenic infarcts and prior scarring in the left mid kidney.  Symptomatically, he feels good.  Imaging reveals left hepatic vein thrombosis.  Code status is DNR/DNI.  Plan: 1.  Labs today: CBC with diff, CMP, Mg, TSH. 2.  Cycle #14 Keytruda today. 3.  Continue Xgeva every 6 weeks (alternating cycles).  Due today. 4.  Encourage patient to take calcium. 5.  Continue Xarelto 20 mg q day 6.  RTC in 3 weeks for MD assessment, labs (CBC with diff, CMP, Mg, TSH), and cycle #15 Keytruda.   Lequita Asal, MD  05/15/2017, 9:44 AM

## 2017-05-16 LAB — THYROID PANEL WITH TSH
Free Thyroxine Index: 2 (ref 1.2–4.9)
T3 Uptake Ratio: 23 % — ABNORMAL LOW (ref 24–39)
T4, Total: 8.8 ug/dL (ref 4.5–12.0)
TSH: 2.63 u[IU]/mL (ref 0.450–4.500)

## 2017-06-05 ENCOUNTER — Inpatient Hospital Stay: Payer: Medicare Other

## 2017-06-05 ENCOUNTER — Encounter: Payer: Self-pay | Admitting: Oncology

## 2017-06-05 ENCOUNTER — Inpatient Hospital Stay (HOSPITAL_BASED_OUTPATIENT_CLINIC_OR_DEPARTMENT_OTHER): Payer: Medicare Other | Admitting: Oncology

## 2017-06-05 VITALS — BP 127/81 | HR 114 | Temp 97.0°F | Resp 18 | Wt 131.4 lb

## 2017-06-05 DIAGNOSIS — G893 Neoplasm related pain (acute) (chronic): Secondary | ICD-10-CM

## 2017-06-05 DIAGNOSIS — Z5112 Encounter for antineoplastic immunotherapy: Secondary | ICD-10-CM

## 2017-06-05 DIAGNOSIS — I820 Budd-Chiari syndrome: Secondary | ICD-10-CM | POA: Diagnosis not present

## 2017-06-05 DIAGNOSIS — C787 Secondary malignant neoplasm of liver and intrahepatic bile duct: Secondary | ICD-10-CM

## 2017-06-05 DIAGNOSIS — Z8701 Personal history of pneumonia (recurrent): Secondary | ICD-10-CM

## 2017-06-05 DIAGNOSIS — R634 Abnormal weight loss: Secondary | ICD-10-CM | POA: Diagnosis not present

## 2017-06-05 DIAGNOSIS — M069 Rheumatoid arthritis, unspecified: Secondary | ICD-10-CM

## 2017-06-05 DIAGNOSIS — D721 Eosinophilia: Secondary | ICD-10-CM

## 2017-06-05 DIAGNOSIS — Z5111 Encounter for antineoplastic chemotherapy: Secondary | ICD-10-CM | POA: Diagnosis not present

## 2017-06-05 DIAGNOSIS — C7951 Secondary malignant neoplasm of bone: Secondary | ICD-10-CM

## 2017-06-05 DIAGNOSIS — Z79899 Other long term (current) drug therapy: Secondary | ICD-10-CM

## 2017-06-05 DIAGNOSIS — I899 Noninfective disorder of lymphatic vessels and lymph nodes, unspecified: Secondary | ICD-10-CM

## 2017-06-05 DIAGNOSIS — Z87891 Personal history of nicotine dependence: Secondary | ICD-10-CM

## 2017-06-05 DIAGNOSIS — C3411 Malignant neoplasm of upper lobe, right bronchus or lung: Secondary | ICD-10-CM | POA: Diagnosis not present

## 2017-06-05 LAB — COMPREHENSIVE METABOLIC PANEL
ALT: 65 U/L — ABNORMAL HIGH (ref 17–63)
AST: 87 U/L — ABNORMAL HIGH (ref 15–41)
Albumin: 3.6 g/dL (ref 3.5–5.0)
Alkaline Phosphatase: 76 U/L (ref 38–126)
Anion gap: 7 (ref 5–15)
BUN: 15 mg/dL (ref 6–20)
CO2: 27 mmol/L (ref 22–32)
Calcium: 9.1 mg/dL (ref 8.9–10.3)
Chloride: 102 mmol/L (ref 101–111)
Creatinine, Ser: 0.95 mg/dL (ref 0.61–1.24)
GFR calc Af Amer: >60 mL/min (ref >60)
GFR calc non Af Amer: >60 mL/min (ref >60)
Glucose, Bld: 110 mg/dL — ABNORMAL HIGH (ref 65–99)
Potassium: 4.5 mmol/L (ref 3.5–5.1)
Sodium: 136 mmol/L (ref 135–145)
Total Bilirubin: 0.9 mg/dL (ref 0.3–1.2)
Total Protein: 8.6 g/dL — ABNORMAL HIGH (ref 6.5–8.1)

## 2017-06-05 LAB — CBC WITH DIFFERENTIAL/PLATELET
Basophils Absolute: 0.2 10*3/uL — ABNORMAL HIGH (ref 0–0.1)
Basophils Relative: 2 %
Eosinophils Absolute: 0.5 10*3/uL (ref 0–0.7)
Eosinophils Relative: 6 %
HCT: 39 % — ABNORMAL LOW (ref 40.0–52.0)
Hemoglobin: 13.7 g/dL (ref 13.0–18.0)
Lymphocytes Relative: 20 %
Lymphs Abs: 1.9 10*3/uL (ref 1.0–3.6)
MCH: 33.4 pg (ref 26.0–34.0)
MCHC: 35.2 g/dL (ref 32.0–36.0)
MCV: 94.8 fL (ref 80.0–100.0)
Monocytes Absolute: 1.3 10*3/uL — ABNORMAL HIGH (ref 0.2–1.0)
Monocytes Relative: 14 %
Neutro Abs: 5.5 10*3/uL (ref 1.4–6.5)
Neutrophils Relative %: 58 %
Platelets: 329 10*3/uL (ref 150–440)
RBC: 4.11 MIL/uL — ABNORMAL LOW (ref 4.40–5.90)
RDW: 12.9 % (ref 11.5–14.5)
WBC: 9.4 10*3/uL (ref 3.8–10.6)

## 2017-06-05 LAB — TSH: TSH: 2.938 u[IU]/mL (ref 0.350–4.500)

## 2017-06-05 LAB — MAGNESIUM: Magnesium: 1.8 mg/dL (ref 1.7–2.4)

## 2017-06-05 MED ORDER — SODIUM CHLORIDE 0.9 % IV SOLN
Freq: Once | INTRAVENOUS | Status: AC
  Administered 2017-06-05: 10:00:00 via INTRAVENOUS
  Filled 2017-06-05: qty 1000

## 2017-06-05 MED ORDER — SODIUM CHLORIDE 0.9 % IV SOLN
200.0000 mg | Freq: Once | INTRAVENOUS | Status: AC
  Administered 2017-06-05: 200 mg via INTRAVENOUS
  Filled 2017-06-05: qty 8

## 2017-06-05 NOTE — Progress Notes (Signed)
Patient states he pulled a muscle in his left rib area over the weekend.  Otherwise, no complaints.

## 2017-06-05 NOTE — Progress Notes (Signed)
Hematology/Oncology Consult note [facility]  [phone] [phone]  Patient Care Team: Patient, No Pcp Per as PCP - General ([place]) Noreene Filbert, MD as Referring Physician (Radiation Oncology) Vilinda Boehringer, MD (Inactive) as Consulting Physician (Internal Medicine)   Name of the patient: Gerald [name]  [id number]  02-10-1951   Date of visit: 06/05/17  Diagnosis- Stage IV adenocarcinoma of lung  Chief complaint/ Reason for visit- on treatment assessment prior to [place]  Heme/Onc history: Gerald [name] is a 66 y.o. male with stage IV adenocarcinoma of the lung and associated leukocytosis with eosinophilia. He presented with a 30 pound weight loss, shortness of breath, cough, and left leg discomfort. He has a 13-15 pack year smoking history.  Bronchoscopy on 03/30/2016 revealed an endobronchial lesion in the RUL anterior segment with 95% occlusion. There was extrinsic compression in the right middle lobe secondary to posterior mass effect. Pathology confirmed non-small cell lung cancer, favor adenocarcinoma.  PD-L1 testing revealed high expression (> 50%).  EGFR, ALK, and ROS1 were negative.  CEA was 2.3 on 03/27/2016.  PET scan on 04/05/2016 revealed intense hypermetabolism (SUV 17.95) associated with a 7.5 cm central perihilar right lung lesion. There was mediastinal (sub-carinal node SUV 8.89; 2.6 cm right paratracheal node SUV 17.49) and upper abdominal retroperitoneal hypermetabolic adenopathy (9 mm aortocaval node SUV 9.2). There were 2 liver metastasis (2.3 cm left lobe SUV 6.19; 2.2 cm segment VIII SUV 5.18). There are multifocal bone metastasis (C2 vertebral body SUV 9.0; 1.6 cm left sacral ala lesion SUV of 8.6).   He had marked leukocytosis with hypereosinophilia. Initial WBC was 72,300 with 53% eosinophils. Bone marrow on 03/29/2016 revealed marked increase in marrow eosinophils (approximate 30%). There was no  diagnostic morphologic evidence of a myeloproliferative or lymphoid neoplasm. Flow cytometry revealed significant increase in eosinophils (54%). There was relative decreased myeloid cells with no significant immunophenotypic abnormalities or increase in blasts. There was no lymphoid abnormalities or evidence of clonality. FISH studies were negative for myeloproliferative neoplasms with eosinophilia (PDGFRA, PDGFRB, FGFR1). BCR-ABL was negative on 03/28/2016.  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  Left lower extremity duplex on 03/25/2016 revealed no evidence of DVT. Discomfort improved on Neurontin 300 mg a day and continues to improve with declining eosinophilia.  He received 41.4 Gy from 04/11/2016 - 05/23/2016.  He received 4 weeks of concurrent carboplatin and Taxol (04/17/2016 - 05/22/2016).    He has received 13 cycles of Keytruda (07/03/2016 - 10/12/2016; 11/22/2016 - 04/24/2017).  He has tolerated treatment well.  He was diagnosed with aspiration pneumonia on 06/16/2016.  He received 10 days of Levaquin.  He completed a course of Augmentin.  He receives [place] every 6 weeks (began 05/22/2016; last 03/27/2017).  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  He began Xarelto on 04/13/2017.  Chest, abdomen, and pelvic CT on 04/13/2017 revealed new left hepatic vein thrombosis. The left hepatic lobe masses were thought to be similar in size compared the prior exam. The segment 8 lesion was less well seen than on the prior exam.  The central necrotic portion was seen but the peripheral rim was much less conspicuous (could represent improvement in this tumor).  There was mild increase in adenopathy in the gastrohepatic ligament.  There was essentially stable appearance in the lungs with considerable radiation pneumonitis, scarring, and volume loss in the right lung, along with a trace loculated pleural  effusion with enhancing margins.  There was stable appearance of prior splenic infarcts and prior scarring in the left mid kidney.  Interval history- reports muscle pull along left chest wall while he was working. It is getting better. Denies any side effects from chemo  ECOG PS- 1 Pain scale- 0 Opioid associated constipation- no  Review of systems- Review of Systems  Constitutional: Negative for chills, fever, malaise/fatigue and weight loss.  HENT: Negative for congestion, ear discharge and nosebleeds.   Eyes: Negative for blurred vision.  Respiratory: Negative for cough, hemoptysis, sputum production, shortness of breath and wheezing.   Cardiovascular: Negative for chest pain, palpitations, orthopnea and claudication.  Gastrointestinal: Negative for abdominal pain, blood in stool, constipation, diarrhea, heartburn, melena, nausea and vomiting.  Genitourinary: Negative for dysuria, flank pain, frequency, hematuria and urgency.  Musculoskeletal: Negative for back pain, joint pain and myalgias.  Skin: Negative for rash.  Neurological: Negative for dizziness, tingling, focal weakness, seizures, weakness and headaches.  Endo/Heme/Allergies: Does not bruise/bleed easily.  Psychiatric/Behavioral: Negative for depression and suicidal ideas. The patient does not have insomnia.      Current treatment- keytruda  Allergies  Allergen Reactions  . Nsaids Other (See Comments)    Pt is unable to take this class of medications.    . Sulfa Antibiotics Other (See Comments)    Reaction:  GI upset      Past Medical History:  Diagnosis Date  . Arthritis    Rheumatoid arthritis  . Cancer ([place])    lung ca   . Collagen vascular disease ([facility])    RA  . Mass of lung   . Pneumothorax    spontaneous  . Renal disorder    as a child     Past Surgical History:  Procedure Laterality Date  . FLEXIBLE BRONCHOSCOPY N/A 03/30/2016   Procedure: FLEXIBLE BRONCHOSCOPY;  Surgeon: Vilinda Boehringer, MD;   Location: [facility] ORS;  Service: Cardiopulmonary;  Laterality: N/A;  . Ureter repair as a child      Social History   Social History  . Marital status: Legally Separated    Spouse name: N/A  . Number of children: N/A  . Years of education: N/A   Occupational History  . Not on file.   Social History Main Topics  . Smoking status: Former Smoker    Packs/day: 0.50    Years: 26.00    Types: Cigarettes    Quit date: 06/08/2000  . Smokeless tobacco: Never Used     Comment: Quit about 5 years ago  . Alcohol use 0.0 oz/week     Comment: occasionally beer  . Drug use: No  . Sexual activity: Not on file   Other Topics Concern  . Not on file   Social History Narrative   Lives at home with son.    Family History  Problem Relation Age of Onset  . Brain cancer Mother   . Lung cancer Maternal Uncle   . Cancer Sister        Metastatic cancer- unknown primary     Current Outpatient Prescriptions:  .  calcium-vitamin D (OSCAL WITH D) 500-200 MG-UNIT tablet, Take 1 tablet by mouth 2 (two) times daily., Disp: 180 tablet, Rfl: 0 .  folic acid (FOLVITE) 1 MG tablet, Take 1 tablet (1 mg total) by mouth daily., Disp: 30 tablet, Rfl: 3 .  gabapentin (NEURONTIN) 300 MG capsule, Take 1 capsule (300 mg total) by mouth at bedtime., Disp: 30 capsule, Rfl: 2 .  HYDROcodone-acetaminophen ([place])  7.5-325 MG tablet, Take 1 tablet by mouth every 4 (four) hours as needed for moderate pain (Every 4-6 hours as needed for pain)., Disp: 30 tablet, Rfl: 0 .  ondansetron (ZOFRAN) 8 MG tablet, Take 1 tablet (8 mg total) by mouth 2 (two) times daily as needed for nausea or vomiting., Disp: 20 tablet, Rfl: 0 .  oxyCODONE (OXY IR/ROXICODONE) 5 MG immediate release tablet, Take 1 tablet (5 mg total) by mouth every 4 (four) hours as needed for severe pain., Disp: 30 tablet, Rfl: 0 .  oxyCODONE-acetaminophen (PERCOCET/ROXICET) 5-325 MG tablet, Take 1 tablet by mouth every 6 (six) hours as needed for severe pain.,  Disp: 30 tablet, Rfl: 0 .  Rivaroxaban (XARELTO) 15 MG TABS tablet, Take 1 tablet (15 mg total) by mouth 2 (two) times daily with a meal., Disp: 24 tablet, Rfl: 0 .  rivaroxaban (XARELTO) 20 MG TABS tablet, Take 1 tablet (20 mg total) by mouth daily with supper., Disp: 30 tablet, Rfl: 1 .  sucralfate (CARAFATE) 1 g tablet, Take 1 tablet (1 g total) by mouth 3 (three) times daily. Dissolve in 2-3 tbsp warm water, swish and swallow., Disp: 90 tablet, Rfl: 3  Physical exam: There were no vitals filed for this visit. Physical Exam  Constitutional: He is oriented to person, place, and time.  Thin gentleman in no acute distress  HENT:  Head: Normocephalic and atraumatic.  Eyes: EOM are normal. Pupils are equal, round, and reactive to light.  Neck: Normal range of motion.  Cardiovascular: Normal rate, regular rhythm and normal heart sounds.   Pulmonary/Chest: Effort normal and breath sounds normal.  Abdominal: Soft. Bowel sounds are normal.  Neurological: He is alert and oriented to person, place, and time.  Skin: Skin is warm and dry.     CMP Latest Ref Rng & Units 06/05/2017  Glucose 65 - 99 mg/dL 110(H)  BUN 6 - 20 mg/dL 15  Creatinine 0.61 - 1.24 mg/dL 0.95  Sodium 135 - 145 mmol/L 136  Potassium 3.5 - 5.1 mmol/L 4.5  Chloride 101 - 111 mmol/L 102  CO2 22 - 32 mmol/L 27  Calcium 8.9 - 10.3 mg/dL 9.1  Total Protein 6.5 - 8.1 g/dL 8.6(H)  Total Bilirubin 0.3 - 1.2 mg/dL 0.9  Alkaline Phos 38 - 126 U/L 76  AST 15 - 41 U/L 87(H)  ALT 17 - 63 U/L 65(H)   CBC Latest Ref Rng & Units 06/05/2017  WBC 3.8 - 10.6 K/uL 9.4  Hemoglobin 13.0 - 18.0 g/dL 13.7  Hematocrit 40.0 - 52.0 % 39.0(L)  Platelets 150 - 440 K/uL 329     Assessment and plan- Patient is a 66 y.o. male with Stage Iv adenocarcinoma of the lung currently on palliative keytruda  Counts ok to proceed with next cycle of [place] today. Prior TSH normal. TSH from today pending. RTC in 3 weeks with cbc, mg and cmp for cycle 16  of keytruda and see Dr. Mike Gip. Due for xgeva next week  Mild elevation of AST and ALT. He has had AST elevation in the past. Continue to monitor   Visit Diagnosis 1. Primary cancer of right upper lobe of lung ([place])   2. Bone metastasis ([place])   3. Encounter for antineoplastic immunotherapy      Dr. Randa Evens, MD, MPH [facility] Pager- [phone] 06/05/2017 11:09 AM

## 2017-06-26 ENCOUNTER — Inpatient Hospital Stay: Payer: Medicare Other | Attending: Hematology and Oncology | Admitting: Hematology and Oncology

## 2017-06-26 ENCOUNTER — Inpatient Hospital Stay: Payer: Medicare Other

## 2017-06-26 ENCOUNTER — Encounter: Payer: Self-pay | Admitting: Hematology and Oncology

## 2017-06-26 ENCOUNTER — Other Ambulatory Visit: Payer: Self-pay | Admitting: Hematology and Oncology

## 2017-06-26 VITALS — BP 120/76 | HR 96 | Temp 97.0°F | Resp 18 | Wt 129.0 lb

## 2017-06-26 DIAGNOSIS — D721 Eosinophilia: Secondary | ICD-10-CM

## 2017-06-26 DIAGNOSIS — Z7901 Long term (current) use of anticoagulants: Secondary | ICD-10-CM | POA: Diagnosis not present

## 2017-06-26 DIAGNOSIS — Z66 Do not resuscitate: Secondary | ICD-10-CM

## 2017-06-26 DIAGNOSIS — Z79899 Other long term (current) drug therapy: Secondary | ICD-10-CM | POA: Diagnosis not present

## 2017-06-26 DIAGNOSIS — C7951 Secondary malignant neoplasm of bone: Secondary | ICD-10-CM

## 2017-06-26 DIAGNOSIS — R948 Abnormal results of function studies of other organs and systems: Secondary | ICD-10-CM | POA: Diagnosis not present

## 2017-06-26 DIAGNOSIS — C787 Secondary malignant neoplasm of liver and intrahepatic bile duct: Secondary | ICD-10-CM | POA: Diagnosis not present

## 2017-06-26 DIAGNOSIS — C3411 Malignant neoplasm of upper lobe, right bronchus or lung: Secondary | ICD-10-CM

## 2017-06-26 DIAGNOSIS — Z87891 Personal history of nicotine dependence: Secondary | ICD-10-CM | POA: Diagnosis not present

## 2017-06-26 DIAGNOSIS — I998 Other disorder of circulatory system: Secondary | ICD-10-CM | POA: Diagnosis not present

## 2017-06-26 DIAGNOSIS — M069 Rheumatoid arthritis, unspecified: Secondary | ICD-10-CM | POA: Diagnosis not present

## 2017-06-26 DIAGNOSIS — I820 Budd-Chiari syndrome: Secondary | ICD-10-CM

## 2017-06-26 DIAGNOSIS — I81 Portal vein thrombosis: Secondary | ICD-10-CM

## 2017-06-26 DIAGNOSIS — Z5112 Encounter for antineoplastic immunotherapy: Secondary | ICD-10-CM

## 2017-06-26 DIAGNOSIS — Z808 Family history of malignant neoplasm of other organs or systems: Secondary | ICD-10-CM | POA: Diagnosis not present

## 2017-06-26 DIAGNOSIS — G893 Neoplasm related pain (acute) (chronic): Secondary | ICD-10-CM

## 2017-06-26 DIAGNOSIS — R748 Abnormal levels of other serum enzymes: Secondary | ICD-10-CM

## 2017-06-26 LAB — CBC WITH DIFFERENTIAL/PLATELET
BASOS ABS: 0.1 10*3/uL (ref 0–0.1)
BASOS PCT: 2 %
EOS ABS: 0.3 10*3/uL (ref 0–0.7)
Eosinophils Relative: 3 %
HCT: 37.8 % — ABNORMAL LOW (ref 40.0–52.0)
HEMOGLOBIN: 13.2 g/dL (ref 13.0–18.0)
Lymphocytes Relative: 18 %
Lymphs Abs: 1.7 10*3/uL (ref 1.0–3.6)
MCH: 33.4 pg (ref 26.0–34.0)
MCHC: 35 g/dL (ref 32.0–36.0)
MCV: 95.6 fL (ref 80.0–100.0)
MONO ABS: 1.3 10*3/uL — AB (ref 0.2–1.0)
MONOS PCT: 14 %
NEUTROS PCT: 63 %
Neutro Abs: 6 10*3/uL (ref 1.4–6.5)
Platelets: 242 10*3/uL (ref 150–440)
RBC: 3.95 MIL/uL — ABNORMAL LOW (ref 4.40–5.90)
RDW: 12.9 % (ref 11.5–14.5)
WBC: 9.5 10*3/uL (ref 3.8–10.6)

## 2017-06-26 LAB — COMPREHENSIVE METABOLIC PANEL
ALT: 116 U/L — ABNORMAL HIGH (ref 17–63)
ANION GAP: 4 — AB (ref 5–15)
AST: 143 U/L — AB (ref 15–41)
Albumin: 3.4 g/dL — ABNORMAL LOW (ref 3.5–5.0)
Alkaline Phosphatase: 94 U/L (ref 38–126)
BILIRUBIN TOTAL: 0.6 mg/dL (ref 0.3–1.2)
BUN: 20 mg/dL (ref 6–20)
CHLORIDE: 104 mmol/L (ref 101–111)
CO2: 28 mmol/L (ref 22–32)
Calcium: 9.3 mg/dL (ref 8.9–10.3)
Creatinine, Ser: 0.78 mg/dL (ref 0.61–1.24)
GFR calc non Af Amer: >60 mL/min (ref >60)
Glucose, Bld: 122 mg/dL — ABNORMAL HIGH (ref 65–99)
POTASSIUM: 4.2 mmol/L (ref 3.5–5.1)
Sodium: 136 mmol/L (ref 135–145)
Total Protein: 8.4 g/dL — ABNORMAL HIGH (ref 6.5–8.1)

## 2017-06-26 LAB — MAGNESIUM: Magnesium: 1.9 mg/dL (ref 1.7–2.4)

## 2017-06-26 LAB — TSH: TSH: 4.321 u[IU]/mL (ref 0.350–4.500)

## 2017-06-26 MED ORDER — DENOSUMAB 120 MG/1.7ML ~~LOC~~ SOLN
120.0000 mg | Freq: Once | SUBCUTANEOUS | Status: AC
  Administered 2017-06-26: 120 mg via SUBCUTANEOUS
  Filled 2017-06-26: qty 1.7

## 2017-06-26 MED ORDER — OXYCODONE HCL 5 MG PO TABS
5.0000 mg | ORAL_TABLET | ORAL | 0 refills | Status: DC | PRN
Start: 2017-06-26 — End: 2017-07-25

## 2017-06-26 NOTE — Progress Notes (Signed)
Patient continues to be tender in left rib area from coughing.  States it is much better than it was.  Patient requesting refill for Roxicodone 5 mg.

## 2017-06-26 NOTE — Progress Notes (Signed)
[facility] day:  06/26/2017   Chief Complaint: Gerald [name] is a 66 y.o. male with stage IV adenocarcinoma of the lung with associated hypereosinophilia who is seen for assessment prior to cycle #16 Keytruda.  HPI:  The patient was last seen in the medical oncology clinic by me on 05/15/2017.  At that time, he was doing well.  He received cycle #14 Keytruda.    He saw Dr. Janese Banks in my absence on 06/05/2017.  He had midly elevated liver function tests (AST 87 and ALT 65).  He received cycle #15 Keytruda.  During the interim, he has done well. He denies any complaints except for tenderness in the left rib rib area which is aggravated by coughing.   Past Medical History:  Diagnosis Date  . Arthritis    Rheumatoid arthritis  . Cancer ([place])    lung ca   . Collagen vascular disease ([facility])    RA  . Mass of lung   . Pneumothorax    spontaneous  . Renal disorder    as a child    Past Surgical History:  Procedure Laterality Date  . FLEXIBLE BRONCHOSCOPY N/A 03/30/2016   Procedure: FLEXIBLE BRONCHOSCOPY;  Surgeon: Vilinda Boehringer, MD;  Location: [facility] ORS;  Service: Cardiopulmonary;  Laterality: N/A;  . Ureter repair as a child      Family History  Problem Relation Age of Onset  . Brain cancer Mother   . Lung cancer Maternal Uncle   . Cancer Sister        Metastatic cancer- unknown primary    Social History:  reports that he quit smoking about 17 years ago. His smoking use included Cigarettes. He has a 13.00 pack-year smoking history. He has never used smokeless tobacco. He reports that he drinks alcohol. He reports that he does not use drugs.  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  He lives in [place].  Contact number is [phone].  He has a great pyrenees.  He has been working on his truck.  The patient is alone today.  Allergies:  Allergies  Allergen Reactions  . Nsaids Other  (See Comments)    Pt is unable to take this class of medications.    . Sulfa Antibiotics Other (See Comments)    Reaction:  GI upset     Current Medications: Current Outpatient Prescriptions  Medication Sig Dispense Refill  . folic acid (FOLVITE) 1 MG tablet Take 1 tablet (1 mg total) by mouth daily. 30 tablet 3  . gabapentin (NEURONTIN) 300 MG capsule Take 1 capsule (300 mg total) by mouth at bedtime. 30 capsule 2  . HYDROcodone-acetaminophen (NORCO) 7.5-325 MG tablet Take 1 tablet by mouth every 4 (four) hours as needed for moderate pain (Every 4-6 hours as needed for pain). 30 tablet 0  . ondansetron (ZOFRAN) 8 MG tablet Take 1 tablet (8 mg total) by mouth 2 (two) times daily as needed for nausea or vomiting. 20 tablet 0  . oxyCODONE (OXY IR/ROXICODONE) 5 MG immediate release tablet Take 1 tablet (5 mg total) by mouth every 4 (four) hours as needed for severe pain. 30 tablet 0  . oxyCODONE-acetaminophen (PERCOCET/ROXICET) 5-325 MG tablet Take 1 tablet by mouth every 6 (six) hours as needed for severe pain. 30 tablet 0  . rivaroxaban (XARELTO) 20 MG TABS tablet Take 1 tablet (20 mg total) by mouth daily with supper.  30 tablet 1  . sucralfate (CARAFATE) 1 g tablet Take 1 tablet (1 g total) by mouth 3 (three) times daily. Dissolve in 2-3 tbsp warm water, swish and swallow. 90 tablet 3  . calcium-vitamin D (OSCAL WITH D) 500-200 MG-UNIT tablet Take 1 tablet by mouth 2 (two) times daily. 180 tablet 0   No current facility-administered medications for this visit.     Review of Systems:  GENERAL:  Feels "ok". No fever, chills or sweats.  Weight down 2 pound. PERFORMANCE STATUS (ECOG):  2 HEENT: No visual changes, sore throat, mouth sores or tenderness. Lungs:  No shortness of breath or cough. No hemoptysis. Cardiac:  No chest pain, palpitations, orthopnea, or PND.  Sleeps in a recliner. GI:  Appetite good.  No nausea, vomiting, diarrhea, constipation, melena or hematochezia. GU:  No urgency,  frequency, dysuria, or hematuria. Musculoskeletal:  Chronic right knee pain.  Rib pain.  No back pain.  No muscle tenderness. Extremities:  No pain or swelling. Skin:  No rashes or ulcers. Neuro:  Neuropathy (stable) on gabapentin.  No headache, focal weakness, balance or coordination issues. Endocrine:  No diabetes, thyroid issues, hot flashes or night sweats. Psych:  No mood changes, depression or anxiety. Pain:  Left rib tenderness (no pain today).  Review of systems:  All other systems reviewed and found to be negative.  Physical Exam: Blood pressure 120/76, pulse 96, temperature (!) 97 F (36.1 C), temperature source Tympanic, resp. rate 18, weight 129 lb (58.5 kg). GENERAL:  Thin gentleman sitting comfortably in the exam room in no acute distress. MENTAL STATUS:  Alert and oriented to person, place and time.  HEAD:Wearing a camo cap. Long gray hair in pony tail. Gerald [name]. Temporal wasting. Normocephalic, atraumatic, face symmetric, no Cushingoid features. EYES:Blue eyes. Pupils equal round and reactive to light and accomodation. No conjunctivitis or scleral icterus. MOQ:[id number] clear without lesion. Tonguenormal. Mucous membranes moist. RESPIRATORY:Clear to auscultationwithout rales, wheezes or rhonchi. CARDIOVASCULAR:Regular rate andrhythmwithout murmur, rub or gallop. ABDOMEN:Soft, non-tender, with active bowel sounds and no hepatosplenomegaly. No guarding or rebound tenderness.  No masses. SKIN: Dry.  No rashes. EXTREMITIES: No edema, no skin discoloration or tenderness. No palpable cords. LYMPHNODES: No palpable cervical, supraclavicular, axillary or inguinal adenopathy  NEUROLOGICAL: Unremarkable. PSYCH: Appropriate.   Imaging studies: 03/27/2016:  Chest CT angiogram revealed an 8 cm mass in the medial right upper lobe. The mass abutted the mediastinum and possibly invaded the left atrium. There was associated widespread thoracic  adenopathy. There was a 2.6 cm enhancing lesion in the medial segment of the left hepatic lobe. 04/05/2016:  PET scan revealed intense hypermetabolism (SUV 17.95) associated with a 7.5 cm central perihilar right lung lesion. There was mediastinal (sub-carinal node SUV 8.89; 2.6 cm right paratracheal node SUV 17.49) and upper abdominal retroperitoneal hypermetabolic adenopathy (9 mm aortocaval node SUV 9.2). There were 2 liver metastasis (2.3 cm left lobe SUV 6.19; 2.2 cm segment VIII SUV 5.18). There are multifocal bone metastasis (C2 vertebral body SUV 9.0; 1.6 cm left sacral ala lesion SUV of 8.6).  03/31/2016:  Head MRI was indeterminate for early metastatic disease to the brain. There was a small focus of gyral enhancement in the anterior right frontal lobe likely is post ischemic, while a small posterior right cerebellar enhancing lesion was more suspicious for small brain metastasis. There were multiple small primarily subacute appearing infarcts in the bilateral MCA and left PICA territories.  04/07/2016:  Bone scan revealed a subtle distal right femur lesion  which might be a small metastasis.  Bone scan was felt to be an unreliable modality for evaluation of osseous metastatic disease as the bone metastases seen on the recent PET and MRI were not evident. 05/12/2016:  Head MRI revealed expected interval evolution of bilateral cerebral in cerebellar infarcts. There was resolution of right frontal and right cerebellar enhancement consistent with subacute infarcts. There was a new 3 mm focus of gyral enhancement in the left occipital lobe and a possible new 4 mm enhancing lesion in the left cerebellum (? vascular enhancement versus tiny metastasis).  08/16/2016:  Head MRI revealed multiple areas of improving enhancement and restricted diffusion compatible with resolving subacute infarcts.  There was no evidence of metastatic disease or acute infarction  10/19/2016:  Chest, abdomen, and pelvic CT  scan revealed radiation changes in the right lung.  The superimposed right lung pneumonia had improved.  The 6.4 x 2.4 cm right perihilar mass had decreased.  There was improving thoracic lymphadenopathy.  There was progression of hepatic metastases, measuring up to 4.7 cm.  Specifically, there was a 3.9 x 3.7 cm metastasis in segment 8 (previously 2.5 x 2.8 cm) and a 4.7 x 4.0 cm metastasis in segment 4B (previously 3.1 x 2.6 cm). 11/14/2016:  PET scan revealed 2 liver masses. Both were significantly more hypermetabolic than on 82/50/5397 and have enlarged compared to the prior PET-CT.  The segment 8 lesion was centrally necrotic but with high activity along its periphery (7.4), and about the same size as it was on 10/19/2016. The lateral segment left hepatic lobe lesion had enlarged (5.9 x 5.3 cm) compared to 10/19/16 (4.1 x 4.4 cm).  The bony metastatic lesions were no longer hypermetabolic and have resolved.  There were post therapy and postoperative findings in the right lung, with a considerable amount of suspected radiation pneumonitis and radiation fibrosis. 12/29/2016:  Chest, abdomen, and pelvic CT revealed mild reduction in size and central necrosis of the hepatic metastatic lesions.  There was continued post therapy related findings along the right hilar region. Dominant right perihilar mass was reduced in thickness compared to 10/19/2016.  04/13/2017:  Chest, abdomen, and pelvic CT revealed new left hepatic vein thrombosis. This somewhat obscured the known left hepatic lobe tumor although overall the left hepatic lobe masses were thought to be similar in size compared the prior exam. The segment 8 lesion was less well seen than on the prior exam.  The central necrotic portion was seen but the peripheral rim was much less conspicuous (could represent improvement in this tumor).  There was mild increase in adenopathy in the gastrohepatic ligament.  There was essentially stable appearance in the lungs  with considerable radiation pneumonitis, scarring, and volume loss in the right lung, along with a trace loculated pleural effusion with enhancing margins.  There was stable appearance of prior splenic infarcts and prior scarring in the left mid kidney.   Appointment on 06/26/2017  Component Date Value Ref Range Status  . WBC 06/26/2017 9.5  3.8 - 10.6 K/uL Final  . RBC 06/26/2017 3.95* 4.40 - 5.90 MIL/uL Final  . Hemoglobin 06/26/2017 13.2  13.0 - 18.0 g/dL Final  . HCT 06/26/2017 37.8* 40.0 - 52.0 % Final  . MCV 06/26/2017 95.6  80.0 - 100.0 fL Final  . MCH 06/26/2017 33.4  26.0 - 34.0 pg Final  . MCHC 06/26/2017 35.0  32.0 - 36.0 g/dL Final  . RDW 06/26/2017 12.9  11.5 - 14.5 % Final  . Platelets 06/26/2017  242  150 - 440 K/uL Final  . Neutrophils Relative % 06/26/2017 63  % Final  . Neutro Abs 06/26/2017 6.0  1.4 - 6.5 K/uL Final  . Lymphocytes Relative 06/26/2017 18  % Final  . Lymphs Abs 06/26/2017 1.7  1.0 - 3.6 K/uL Final  . Monocytes Relative 06/26/2017 14  % Final  . Monocytes Absolute 06/26/2017 1.3* 0.2 - 1.0 K/uL Final  . Eosinophils Relative 06/26/2017 3  % Final  . Eosinophils Absolute 06/26/2017 0.3  0 - 0.7 K/uL Final  . Basophils Relative 06/26/2017 2  % Final  . Basophils Absolute 06/26/2017 0.1  0 - 0.1 K/uL Final  . Sodium 06/26/2017 136  135 - 145 mmol/L Final  . Potassium 06/26/2017 4.2  3.5 - 5.1 mmol/L Final  . Chloride 06/26/2017 104  101 - 111 mmol/L Final  . CO2 06/26/2017 28  22 - 32 mmol/L Final  . Glucose, Bld 06/26/2017 122* 65 - 99 mg/dL Final  . BUN 06/26/2017 20  6 - 20 mg/dL Final  . Creatinine, Ser 06/26/2017 0.78  0.61 - 1.24 mg/dL Final  . Calcium 06/26/2017 9.3  8.9 - 10.3 mg/dL Final  . Total Protein 06/26/2017 8.4* 6.5 - 8.1 g/dL Final  . Albumin 06/26/2017 3.4* 3.5 - 5.0 g/dL Final  . AST 06/26/2017 143* 15 - 41 U/L Final  . ALT 06/26/2017 116* 17 - 63 U/L Final  . Alkaline Phosphatase 06/26/2017 94  38 - 126 U/L Final  . Total Bilirubin  06/26/2017 0.6  0.3 - 1.2 mg/dL Final  . GFR calc non Af Amer 06/26/2017 >60  >60 mL/min Final  . GFR calc Af Amer 06/26/2017 >60  >60 mL/min Final   Comment: (NOTE) The eGFR has been calculated using the CKD EPI equation. This calculation has not been validated in all clinical situations. eGFR's persistently <60 mL/min signify possible Chronic Kidney Disease.   . Anion gap 06/26/2017 4* 5 - 15 Final  . Magnesium 06/26/2017 1.9  1.7 - 2.4 mg/dL Final  . TSH 06/26/2017 4.321  0.350 - 4.500 uIU/mL Final   Performed by a 3rd Generation assay with a functional sensitivity of <=0.01 uIU/mL.    Assessment:  Gerald [name] is a 66 y.o. male with stage IV adenocarcinoma of the lung and associated leukocytosis with eosinophilia. He presented with a 30 pound weight loss, shortness of breath, cough, and left leg discomfort. He has a 13-15 pack year smoking history.  Bronchoscopy on 03/30/2016 revealed an endobronchial lesion in the RUL anterior segment with 95% occlusion. There was extrinsic compression in the right middle lobe secondary to posterior mass effect. Pathology confirmed non-small cell lung cancer, favor adenocarcinoma.  PD-L1 testing revealed high expression (> 50%).  EGFR, ALK, and ROS1 were negative.  CEA was 2.3 on 03/27/2016.  PET scan on 04/05/2016 revealed intense hypermetabolism (SUV 17.95) associated with a 7.5 cm central perihilar right lung lesion. There was mediastinal (sub-carinal node SUV 8.89; 2.6 cm right paratracheal node SUV 17.49) and upper abdominal retroperitoneal hypermetabolic adenopathy (9 mm aortocaval node SUV 9.2). There were 2 liver metastasis (2.3 cm left lobe SUV 6.19; 2.2 cm segment VIII SUV 5.18). There are multifocal bone metastasis (C2 vertebral body SUV 9.0; 1.6 cm left sacral ala lesion SUV of 8.6).   He had marked leukocytosis with hypereosinophilia. Initial WBC was 72,300 with 53% eosinophils. Bone marrow on 03/29/2016 revealed marked increase in  marrow eosinophils (approximate 30%). There was no diagnostic morphologic evidence of a myeloproliferative  or lymphoid neoplasm. Flow cytometry revealed significant increase in eosinophils (54%). There was relative decreased myeloid cells with no significant immunophenotypic abnormalities or increase in blasts. There was no lymphoid abnormalities or evidence of clonality. FISH studies were negative for myeloproliferative neoplasms with eosinophilia (PDGFRA, PDGFRB, FGFR1). BCR-ABL was negative on 03/28/2016.  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  Left lower extremity duplex on 03/25/2016 revealed no evidence of DVT. Discomfort improved on Neurontin 300 mg a day and continues to improve with declining eosinophilia.  He received 41.4 Gy from 04/11/2016 - 05/23/2016.  He received 4 weeks of concurrent carboplatin and Taxol (04/17/2016 - 05/22/2016).    He has received 15 cycles of Keytruda (07/03/2016 - 10/12/2016; 11/22/2016 - 06/05/2017).  He has tolerated treatment well.  He was diagnosed with aspiration pneumonia on 06/16/2016.  He received 10 days of Levaquin.  He completed a course of Augmentin.  He receives [place] every 6 weeks (began 05/22/2016; last 05/15/2017).  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  He began Xarelto on 04/13/2017.  Chest, abdomen, and pelvic CT on 04/13/2017 revealed new left hepatic vein thrombosis. The left hepatic lobe masses were thought to be similar in size compared the prior exam. The segment 8 lesion was less well seen than on the prior exam.  The central necrotic portion was seen but the peripheral rim was much less conspicuous (could represent improvement in this tumor).  There was mild increase in adenopathy in the gastrohepatic ligament.  There was essentially stable appearance in the lungs with considerable radiation pneumonitis, scarring, and volume loss in the right lung,  along with a trace loculated pleural effusion with enhancing margins.  There was stable appearance of prior splenic infarcts and prior scarring in the left mid kidney.  Symptomatically, he has intermittent left rib pain.  Liver function tests are increasing.  Code status is DNR/DNI.  Plan: 1.  Labs today: CBC with diff, CMP, Mg, TSH. 2.  Postpone cycle #16 Keytruda today secondary to increasing LFTs. 3.  Discuss restaging studies slightly early to assess disease status. 4.  Add labs: hepatitis B surface antigen, hepatitis B core antibody and hepatitis C antibody. 5.  Xgeva today. 6.  Chest, abdomen, and pelvic CT scan this week. 7.  Continue Xarelto. 8.  Rx:  oxycodone 5 mg po q 4 hours prn pain (dis: # 30). 9.  RTC in 1 week for MD assessment, labs (CBC with diff, CMP, Mg), review of scans +/- Keytruda.   Lequita Asal, MD  06/26/2017, 10:06 AM

## 2017-06-27 LAB — HEPATITIS B CORE ANTIBODY, TOTAL: Hep B Core Total Ab: POSITIVE — AB

## 2017-06-27 LAB — HEPATITIS B SURFACE ANTIGEN: Hepatitis B Surface Ag: NEGATIVE

## 2017-06-27 LAB — HEPATITIS C ANTIBODY: HCV Ab: >11 s/co ratio — ABNORMAL HIGH (ref 0.0–0.9)

## 2017-06-29 ENCOUNTER — Ambulatory Visit: Admission: RE | Admit: 2017-06-29 | Payer: No Typology Code available for payment source | Source: Ambulatory Visit

## 2017-07-02 ENCOUNTER — Ambulatory Visit
Admission: RE | Admit: 2017-07-02 | Discharge: 2017-07-02 | Disposition: A | Discharge status: Home / Self Care | Payer: Medicare Other | Source: Ambulatory Visit | Attending: Hematology and Oncology | Admitting: Hematology and Oncology

## 2017-07-02 DIAGNOSIS — I700 Atherosclerosis of aorta: Secondary | ICD-10-CM | POA: Insufficient documentation

## 2017-07-02 DIAGNOSIS — Z5112 Encounter for antineoplastic immunotherapy: Secondary | ICD-10-CM | POA: Diagnosis not present

## 2017-07-02 DIAGNOSIS — C3411 Malignant neoplasm of upper lobe, right bronchus or lung: Secondary | ICD-10-CM | POA: Diagnosis not present

## 2017-07-02 DIAGNOSIS — R748 Abnormal levels of other serum enzymes: Secondary | ICD-10-CM

## 2017-07-02 DIAGNOSIS — I820 Budd-Chiari syndrome: Secondary | ICD-10-CM | POA: Diagnosis not present

## 2017-07-02 DIAGNOSIS — G893 Neoplasm related pain (acute) (chronic): Secondary | ICD-10-CM | POA: Insufficient documentation

## 2017-07-02 DIAGNOSIS — Z748 Other problems related to care provider dependency: Secondary | ICD-10-CM | POA: Insufficient documentation

## 2017-07-02 DIAGNOSIS — C787 Secondary malignant neoplasm of liver and intrahepatic bile duct: Secondary | ICD-10-CM | POA: Diagnosis not present

## 2017-07-02 DIAGNOSIS — C7951 Secondary malignant neoplasm of bone: Secondary | ICD-10-CM | POA: Diagnosis not present

## 2017-07-02 MED ORDER — IOPAMIDOL (ISOVUE-300) INJECTION 61%
100.0000 mL | Freq: Once | INTRAVENOUS | Status: AC | PRN
  Administered 2017-07-02: 85 mL via INTRAVENOUS

## 2017-07-03 ENCOUNTER — Inpatient Hospital Stay: Payer: Medicare Other

## 2017-07-03 ENCOUNTER — Inpatient Hospital Stay (HOSPITAL_BASED_OUTPATIENT_CLINIC_OR_DEPARTMENT_OTHER): Payer: Medicare Other | Admitting: Hematology and Oncology

## 2017-07-03 ENCOUNTER — Encounter: Payer: Self-pay | Admitting: Hematology and Oncology

## 2017-07-03 VITALS — BP 103/67 | HR 85 | Temp 96.1°F | Resp 18 | Wt 128.5 lb

## 2017-07-03 DIAGNOSIS — R748 Abnormal levels of other serum enzymes: Secondary | ICD-10-CM

## 2017-07-03 DIAGNOSIS — C7951 Secondary malignant neoplasm of bone: Secondary | ICD-10-CM | POA: Diagnosis not present

## 2017-07-03 DIAGNOSIS — G893 Neoplasm related pain (acute) (chronic): Secondary | ICD-10-CM

## 2017-07-03 DIAGNOSIS — I998 Other disorder of circulatory system: Secondary | ICD-10-CM

## 2017-07-03 DIAGNOSIS — C787 Secondary malignant neoplasm of liver and intrahepatic bile duct: Secondary | ICD-10-CM

## 2017-07-03 DIAGNOSIS — C3411 Malignant neoplasm of upper lobe, right bronchus or lung: Secondary | ICD-10-CM | POA: Diagnosis not present

## 2017-07-03 DIAGNOSIS — B1910 Unspecified viral hepatitis B without hepatic coma: Secondary | ICD-10-CM

## 2017-07-03 DIAGNOSIS — R948 Abnormal results of function studies of other organs and systems: Secondary | ICD-10-CM | POA: Diagnosis not present

## 2017-07-03 DIAGNOSIS — Z5112 Encounter for antineoplastic immunotherapy: Secondary | ICD-10-CM

## 2017-07-03 DIAGNOSIS — I820 Budd-Chiari syndrome: Secondary | ICD-10-CM

## 2017-07-03 DIAGNOSIS — I81 Portal vein thrombosis: Secondary | ICD-10-CM

## 2017-07-03 DIAGNOSIS — Z7189 Other specified counseling: Secondary | ICD-10-CM

## 2017-07-03 DIAGNOSIS — Z66 Do not resuscitate: Secondary | ICD-10-CM | POA: Diagnosis not present

## 2017-07-03 DIAGNOSIS — D721 Eosinophilia: Secondary | ICD-10-CM | POA: Diagnosis not present

## 2017-07-03 DIAGNOSIS — M069 Rheumatoid arthritis, unspecified: Secondary | ICD-10-CM | POA: Diagnosis not present

## 2017-07-03 DIAGNOSIS — Z7901 Long term (current) use of anticoagulants: Secondary | ICD-10-CM | POA: Diagnosis not present

## 2017-07-03 DIAGNOSIS — B1920 Unspecified viral hepatitis C without hepatic coma: Secondary | ICD-10-CM

## 2017-07-03 DIAGNOSIS — Z87891 Personal history of nicotine dependence: Secondary | ICD-10-CM

## 2017-07-03 DIAGNOSIS — Z79899 Other long term (current) drug therapy: Secondary | ICD-10-CM

## 2017-07-03 DIAGNOSIS — Z808 Family history of malignant neoplasm of other organs or systems: Secondary | ICD-10-CM

## 2017-07-03 LAB — COMPREHENSIVE METABOLIC PANEL
ALT: 93 U/L — ABNORMAL HIGH (ref 17–63)
AST: 108 U/L — ABNORMAL HIGH (ref 15–41)
Albumin: 3.4 g/dL — ABNORMAL LOW (ref 3.5–5.0)
Alkaline Phosphatase: 82 U/L (ref 38–126)
Anion gap: 5 (ref 5–15)
BUN: 18 mg/dL (ref 6–20)
CO2: 28 mmol/L (ref 22–32)
Calcium: 9.2 mg/dL (ref 8.9–10.3)
Chloride: 104 mmol/L (ref 101–111)
Creatinine, Ser: 0.8 mg/dL (ref 0.61–1.24)
GFR calc Af Amer: >60 mL/min (ref >60)
GFR calc non Af Amer: >60 mL/min (ref >60)
Glucose, Bld: 133 mg/dL — ABNORMAL HIGH (ref 65–99)
Potassium: 4.1 mmol/L (ref 3.5–5.1)
Sodium: 137 mmol/L (ref 135–145)
Total Bilirubin: 0.7 mg/dL (ref 0.3–1.2)
Total Protein: 8.1 g/dL (ref 6.5–8.1)

## 2017-07-03 LAB — MAGNESIUM: Magnesium: 1.8 mg/dL (ref 1.7–2.4)

## 2017-07-03 LAB — CBC WITH DIFFERENTIAL/PLATELET
Basophils Absolute: 0.2 10*3/uL — ABNORMAL HIGH (ref 0–0.1)
Basophils Relative: 2 %
Eosinophils Absolute: 0.5 10*3/uL (ref 0–0.7)
Eosinophils Relative: 7 %
HCT: 39.3 % — ABNORMAL LOW (ref 40.0–52.0)
Hemoglobin: 13.5 g/dL (ref 13.0–18.0)
Lymphocytes Relative: 20 %
Lymphs Abs: 1.5 10*3/uL (ref 1.0–3.6)
MCH: 32.8 pg (ref 26.0–34.0)
MCHC: 34.3 g/dL (ref 32.0–36.0)
MCV: 95.7 fL (ref 80.0–100.0)
Monocytes Absolute: 1.1 10*3/uL — ABNORMAL HIGH (ref 0.2–1.0)
Monocytes Relative: 15 %
Neutro Abs: 4.2 10*3/uL (ref 1.4–6.5)
Neutrophils Relative %: 56 %
Platelets: 216 10*3/uL (ref 150–440)
RBC: 4.11 MIL/uL — ABNORMAL LOW (ref 4.40–5.90)
RDW: 13.6 % (ref 11.5–14.5)
WBC: 7.4 10*3/uL (ref 3.8–10.6)

## 2017-07-03 NOTE — Progress Notes (Signed)
[facility] day:  07/03/2017   Chief Complaint: Gerald [name] is a 66 y.o. male with stage IV adenocarcinoma of the lung with associated hypereosinophilia who is seen for review of interval scans and discussion regarding direction of therapy.  HPI:  The patient was last seen in the medical oncology clinic on 06/26/2017.  At that time cycle #16 Beryle Flock was postponed secondary to increasing liver function tests.  He felt good.  Restaging scans were ordered.  Hepatitis testing revealed + hepatitis B core antibody and + hepatitis C antibody.  Chest, abdomen, and pelvic CT scan on 07/02/2017 revealed interval increase in the left hepatic vein thrombosis with progressive heterogeneity and enhancement of the left hepatic lobe, predominantly obscuring the known left hepatic lobe lesion. There was a possible new 1.5 cm lateral left hepatic lobe lesion versus focal area of heterogeneity. There was slight interval decrease in the size of the irregular low-attenuation lesion within segment 8 of the liver. Right lung was stable in appearance with interval development of consolidation within the lingula representing atelectasis or infection.  Symptomatically, he denies any new complaints.  He has been compliant on his Xarelto. He may have missed one or 2 doses in total.   Past Medical History:  Diagnosis Date  . Arthritis    Rheumatoid arthritis  . Cancer ([place])    lung ca   . Collagen vascular disease ([facility])    RA  . Mass of lung   . Pneumothorax    spontaneous  . Renal disorder    as a child    Past Surgical History:  Procedure Laterality Date  . FLEXIBLE BRONCHOSCOPY N/A 03/30/2016   Procedure: FLEXIBLE BRONCHOSCOPY;  Surgeon: Vilinda Boehringer, MD;  Location: [facility] ORS;  Service: Cardiopulmonary;  Laterality: N/A;  . Ureter repair as a child      Family History  Problem Relation Age of Onset  . Brain cancer Mother   . Lung cancer Maternal Uncle   .  Cancer Sister        Metastatic cancer- unknown primary    Social History:  reports that he quit smoking about 17 years ago. His smoking use included Cigarettes. He has a 13.00 pack-year smoking history. He has never used smokeless tobacco. He reports that he drinks alcohol. He reports that he does not use drugs.  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  He lives in [place].  Contact number is [phone].  He has a great pyrenees.  He has been working on his truck.  The patient is alone today.  Allergies:  Allergies  Allergen Reactions  . Nsaids Other (See Comments)    Pt is unable to take this class of medications.    . Sulfa Antibiotics Other (See Comments)    Reaction:  GI upset     Current Medications: Current Outpatient Prescriptions  Medication Sig Dispense Refill  . folic acid (FOLVITE) 1 MG tablet Take 1 tablet (1 mg total) by mouth daily. 30 tablet 3  . gabapentin (NEURONTIN) 300 MG capsule Take 1 capsule (300 mg total) by mouth at bedtime. 30 capsule 2  . HYDROcodone-acetaminophen (NORCO) 7.5-325 MG tablet Take 1 tablet by mouth every 4 (four) hours as needed for moderate pain (Every 4-6 hours as needed for pain). 30 tablet 0  . ondansetron (ZOFRAN) 8 MG tablet Take 1 tablet (8 mg total) by mouth 2 (two)  times daily as needed for nausea or vomiting. 20 tablet 0  . oxyCODONE (OXY IR/ROXICODONE) 5 MG immediate release tablet Take 1 tablet (5 mg total) by mouth every 4 (four) hours as needed for severe pain. 30 tablet 0  . oxyCODONE-acetaminophen (PERCOCET/ROXICET) 5-325 MG tablet Take 1 tablet by mouth every 6 (six) hours as needed for severe pain. 30 tablet 0  . rivaroxaban (XARELTO) 20 MG TABS tablet Take 1 tablet (20 mg total) by mouth daily with supper. 30 tablet 1  . sucralfate (CARAFATE) 1 g tablet Take 1 tablet (1 g total) by mouth 3 (three) times daily. Dissolve in 2-3 tbsp warm water, swish and swallow. 90 tablet 3   . calcium-vitamin D (OSCAL WITH D) 500-200 MG-UNIT tablet Take 1 tablet by mouth 2 (two) times daily. 180 tablet 0   No current facility-administered medications for this visit.     Review of Systems:  GENERAL:  Feels "fine". No fever, chills or sweats.  Weight down 1 pound. PERFORMANCE STATUS (ECOG):  2 HEENT: No visual changes, sore throat, mouth sores or tenderness. Lungs:  No shortness of breath or cough. No hemoptysis. Cardiac:  No chest pain, palpitations, orthopnea, or PND.  Sleeps in a recliner. GI:  Appetite good.  No nausea, vomiting, diarrhea, constipation, melena or hematochezia. GU:  No urgency, frequency, dysuria, or hematuria. Musculoskeletal:  Chronic right knee pain. No back pain.  No muscle tenderness. Extremities:  No pain or swelling. Skin: No rash. Neuro:  Neuropathy (stable) on gabapentin.  No headache, focal weakness, balance or coordination issues. Endocrine:  No diabetes, thyroid issues, hot flashes or night sweats. Psych:  No mood changes, depression or anxiety. Pain:  No pain.  Review of systems:  All other systems reviewed and found to be negative.  Physical Exam: Blood pressure 103/67, pulse 85, temperature (!) 96.1 F (35.6 C), temperature source Tympanic, resp. rate 18, weight 128 lb 8 oz (58.3 kg). GENERAL:  Thin gentleman sitting comfortably in the exam room in no acute distress. MENTAL STATUS:  Alert and oriented to person, place and time.  HEAD:Wearing a tattered cap. Long gray hair in pony tail. Gerald Duffel. Temporal wasting. Normocephalic, atraumatic, face symmetric, no Cushingoid features. EYES:Blue eyes. No conjunctivitis or scleral icterus. NEUROLOGICAL: Unremarkable. PSYCH: Appropriate.   Imaging studies: 03/27/2016:  Chest CT angiogram revealed an 8 cm mass in the medial right upper lobe. The mass abutted the mediastinum and possibly invaded the left atrium. There was associated widespread thoracic adenopathy. There was a 2.6  cm enhancing lesion in the medial segment of the left hepatic lobe. 04/05/2016:  PET scan revealed intense hypermetabolism (SUV 17.95) associated with a 7.5 cm central perihilar right lung lesion. There was mediastinal (sub-carinal node SUV 8.89; 2.6 cm right paratracheal node SUV 17.49) and upper abdominal retroperitoneal hypermetabolic adenopathy (9 mm aortocaval node SUV 9.2). There were 2 liver metastasis (2.3 cm left lobe SUV 6.19; 2.2 cm segment VIII SUV 5.18). There are multifocal bone metastasis (C2 vertebral body SUV 9.0; 1.6 cm left sacral ala lesion SUV of 8.6).  03/31/2016:  Head MRI was indeterminate for early metastatic disease to the brain. There was a small focus of gyral enhancement in the anterior right frontal lobe likely is post ischemic, while a small posterior right cerebellar enhancing lesion was more suspicious for small brain metastasis. There were multiple small primarily subacute appearing infarcts in the bilateral MCA and left PICA territories.  04/07/2016:  Bone scan revealed a subtle distal right femur  lesion which might be a small metastasis.  Bone scan was felt to be an unreliable modality for evaluation of osseous metastatic disease as the bone metastases seen on the recent PET and MRI were not evident. 05/12/2016:  Head MRI revealed expected interval evolution of bilateral cerebral in cerebellar infarcts. There was resolution of right frontal and right cerebellar enhancement consistent with subacute infarcts. There was a new 3 mm focus of gyral enhancement in the left occipital lobe and a possible new 4 mm enhancing lesion in the left cerebellum (? vascular enhancement versus tiny metastasis).  08/16/2016:  Head MRI revealed multiple areas of improving enhancement and restricted diffusion compatible with resolving subacute infarcts.  There was no evidence of metastatic disease or acute infarction  10/19/2016:  Chest, abdomen, and pelvic CT scan revealed radiation changes  in the right lung.  The superimposed right lung pneumonia had improved.  The 6.4 x 2.4 cm right perihilar mass had decreased.  There was improving thoracic lymphadenopathy.  There was progression of hepatic metastases, measuring up to 4.7 cm.  Specifically, there was a 3.9 x 3.7 cm metastasis in segment 8 (previously 2.5 x 2.8 cm) and a 4.7 x 4.0 cm metastasis in segment 4B (previously 3.1 x 2.6 cm). 11/14/2016:  PET scan revealed 2 liver masses. Both were significantly more hypermetabolic than on 36/14/4315 and have enlarged compared to the prior PET-CT.  The segment 8 lesion was centrally necrotic but with high activity along its periphery (7.4), and about the same size as it was on 10/19/2016. The lateral segment left hepatic lobe lesion had enlarged (5.9 x 5.3 cm) compared to 10/19/16 (4.1 x 4.4 cm).  The bony metastatic lesions were no longer hypermetabolic and have resolved.  There were post therapy and postoperative findings in the right lung, with a considerable amount of suspected radiation pneumonitis and radiation fibrosis. 12/29/2016:  Chest, abdomen, and pelvic CT revealed mild reduction in size and central necrosis of the hepatic metastatic lesions.  There was continued post therapy related findings along the right hilar region. Dominant right perihilar mass was reduced in thickness compared to 10/19/2016.  04/13/2017:  Chest, abdomen, and pelvic CT revealed new left hepatic vein thrombosis. This somewhat obscured the known left hepatic lobe tumor although overall the left hepatic lobe masses were thought to be similar in size compared the prior exam. The segment 8 lesion was less well seen than on the prior exam.  The central necrotic portion was seen but the peripheral rim was much less conspicuous (could represent improvement in this tumor).  There was mild increase in adenopathy in the gastrohepatic ligament.  There was essentially stable appearance in the lungs with considerable radiation  pneumonitis, scarring, and volume loss in the right lung, along with a trace loculated pleural effusion with enhancing margins.  There was stable appearance of prior splenic infarcts and prior scarring in the left mid kidney. 07/02/2017:  Chest, abdomen, and pelvic CT revealed interval increase in the left hepatic vein thrombosis with progressive heterogeneity and enhancement of the left hepatic lobe, predominantly obscuring the known left hepatic lobe lesion. There was a possible new 1.5 cm lateral left hepatic lobe lesion versus focal area of heterogeneity. There was slight interval decrease in the size of the irregular low-attenuation lesion within segment 8 of the liver. Right lung was stable in appearance with interval development of consolidation within the lingula representing atelectasis or infection.   Appointment on 07/03/2017  Component Date Value Ref Range Status  .  WBC 07/03/2017 7.4  3.8 - 10.6 K/uL Final  . RBC 07/03/2017 4.11* 4.40 - 5.90 MIL/uL Final  . Hemoglobin 07/03/2017 13.5  13.0 - 18.0 g/dL Final  . HCT 07/03/2017 39.3* 40.0 - 52.0 % Final  . MCV 07/03/2017 95.7  80.0 - 100.0 fL Final  . MCH 07/03/2017 32.8  26.0 - 34.0 pg Final  . MCHC 07/03/2017 34.3  32.0 - 36.0 g/dL Final  . RDW 07/03/2017 13.6  11.5 - 14.5 % Final  . Platelets 07/03/2017 216  150 - 440 K/uL Final  . Neutrophils Relative % 07/03/2017 56  % Final  . Neutro Abs 07/03/2017 4.2  1.4 - 6.5 K/uL Final  . Lymphocytes Relative 07/03/2017 20  % Final  . Lymphs Abs 07/03/2017 1.5  1.0 - 3.6 K/uL Final  . Monocytes Relative 07/03/2017 15  % Final  . Monocytes Absolute 07/03/2017 1.1* 0.2 - 1.0 K/uL Final  . Eosinophils Relative 07/03/2017 7  % Final  . Eosinophils Absolute 07/03/2017 0.5  0 - 0.7 K/uL Final  . Basophils Relative 07/03/2017 2  % Final  . Basophils Absolute 07/03/2017 0.2* 0 - 0.1 K/uL Final  . Sodium 07/03/2017 137  135 - 145 mmol/L Final  . Potassium 07/03/2017 4.1  3.5 - 5.1 mmol/L Final   . Chloride 07/03/2017 104  101 - 111 mmol/L Final  . CO2 07/03/2017 28  22 - 32 mmol/L Final  . Glucose, Bld 07/03/2017 133* 65 - 99 mg/dL Final  . BUN 07/03/2017 18  6 - 20 mg/dL Final  . Creatinine, Ser 07/03/2017 0.80  0.61 - 1.24 mg/dL Final  . Calcium 07/03/2017 9.2  8.9 - 10.3 mg/dL Final  . Total Protein 07/03/2017 8.1  6.5 - 8.1 g/dL Final  . Albumin 07/03/2017 3.4* 3.5 - 5.0 g/dL Final  . AST 07/03/2017 108* 15 - 41 U/L Final  . ALT 07/03/2017 93* 17 - 63 U/L Final  . Alkaline Phosphatase 07/03/2017 82  38 - 126 U/L Final  . Total Bilirubin 07/03/2017 0.7  0.3 - 1.2 mg/dL Final  . GFR calc non Af Amer 07/03/2017 >60  >60 mL/min Final  . GFR calc Af Amer 07/03/2017 >60  >60 mL/min Final   Comment: (NOTE) The eGFR has been calculated using the CKD EPI equation. This calculation has not been validated in all clinical situations. eGFR's persistently <60 mL/min signify possible Chronic Kidney Disease.   . Anion gap 07/03/2017 5  5 - 15 Final  . Magnesium 07/03/2017 1.8  1.7 - 2.4 mg/dL Final    Assessment:  Gabriella Guile is a 66 y.o. male with stage IV adenocarcinoma of the lung and associated leukocytosis with eosinophilia. He presented with a 30 pound weight loss, shortness of breath, cough, and left leg discomfort. He has a 13-15 pack year smoking history.  Bronchoscopy on 03/30/2016 revealed an endobronchial lesion in the RUL anterior segment with 95% occlusion. There was extrinsic compression in the right middle lobe secondary to posterior mass effect. Pathology confirmed non-small cell lung cancer, favor adenocarcinoma.  PD-L1 testing revealed high expression (> 50%).  EGFR, ALK, and ROS1 were negative.  CEA was 2.3 on 03/27/2016.  PET scan on 04/05/2016 revealed intense hypermetabolism (SUV 17.95) associated with a 7.5 cm central perihilar right lung lesion. There was mediastinal (sub-carinal node SUV 8.89; 2.6 cm right paratracheal node SUV 17.49) and upper abdominal  retroperitoneal hypermetabolic adenopathy (9 mm aortocaval node SUV 9.2). There were 2 liver metastasis (2.3 cm left lobe  SUV 6.19; 2.2 cm segment VIII SUV 5.18). There are multifocal bone metastasis (C2 vertebral body SUV 9.0; 1.6 cm left sacral ala lesion SUV of 8.6).   He had marked leukocytosis with hypereosinophilia. Initial WBC was 72,300 with 53% eosinophils. Bone marrow on 03/29/2016 revealed marked increase in marrow eosinophils (approximate 30%). There was no diagnostic morphologic evidence of a myeloproliferative or lymphoid neoplasm. Flow cytometry revealed significant increase in eosinophils (54%). There was relative decreased myeloid cells with no significant immunophenotypic abnormalities or increase in blasts. There was no lymphoid abnormalities or evidence of clonality. FISH studies were negative for myeloproliferative neoplasms with eosinophilia (PDGFRA, PDGFRB, FGFR1). BCR-ABL was negative on 03/28/2016.  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  Left lower extremity duplex on 03/25/2016 revealed no evidence of DVT. Discomfort improved on Neurontin 300 mg a day and continues to improve with declining eosinophilia.  He received 41.4 Gy from 04/11/2016 - 05/23/2016.  He received 4 weeks of concurrent carboplatin and Taxol (04/17/2016 - 05/22/2016).    He has received 15 cycles of Keytruda (07/03/2016 - 10/12/2016; 11/22/2016 - 06/05/2017).  He has tolerated treatment well.  He was diagnosed with aspiration pneumonia on 06/16/2016.  He received 10 days of Levaquin.  He completed a course of Augmentin.  He receives [place] every 6 weeks (began 05/22/2016; last 05/15/2017).  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  He began Xarelto on 04/13/2017.  He has increased liver function tests.  Etiology is possibly secondary to acute hepatitis, irritation due to thrombosis, or progressive disease (metastatic  liver lesion or [place]).  He is hepatitis B core antibody and hepatitis C antibody positive.  Chest, abdomen, and pelvic CT on 07/02/2017 revealed interval increase in the left hepatic vein thrombosis with progressive heterogeneity and enhancement of the left hepatic lobe, predominantly obscuring the known left hepatic lobe lesion. There was a possible new 1.5 cm lateral left hepatic lobe lesion versus focal area of heterogeneity. There was slight interval decrease in the size of the irregular low-attenuation lesion within segment 8 of the liver. Right lung was stable in appearance with interval development of consolidation within the lingula representing atelectasis or infection.  Symptomatically, he feels good.  Imaging reveals increase in left hepatic vein thrombosis and possible new liver disease.  Code status is DNR/DNI.  Plan: 1.  Labs today: CBC with diff, CMP, Mg, hepatitis C by PCR, AFP. 2.  Discuss interval chest, abdomen, and pelvic CT scan.  Hepatis vein thrombosis has increased.  There is progressive enhancement of the left hepatic lobe and a questionable new 1.5 cm lateral left hepatic lobe lesion.  Discuss plan for liver MRI to further define lesions as well as thrombosis. Given his positive serologies for hepatitis B and C, differential also includes hepatocellular carcinoma.  Will check an AFP. 3.  Discuss plan to anticoagulation. He has been on Xarelto. Thrombosis appears to have progressed. Etiology could be due to tumor thrombus.  Discuss consideration of switch to Lovenox or Coumadin. Will preauth Lovenox. 4.  Continue Xarelto. 5.  No Keytruda today 6.  Abdomen MRI today or tomorrow 7.  Preauth Lovenox 8.  RTC on 07/05/2017 for MD review of testing.   Lequita Asal, MD  07/03/2017, 9:19 AM

## 2017-07-03 NOTE — Progress Notes (Signed)
No treatment today per Dr. Mike Gip. Gerald [name]

## 2017-07-03 NOTE — Progress Notes (Signed)
Patient offers no complaints today. 

## 2017-07-04 ENCOUNTER — Other Ambulatory Visit: Payer: Self-pay | Admitting: Hematology and Oncology

## 2017-07-04 DIAGNOSIS — C3411 Malignant neoplasm of upper lobe, right bronchus or lung: Secondary | ICD-10-CM

## 2017-07-04 DIAGNOSIS — C787 Secondary malignant neoplasm of liver and intrahepatic bile duct: Secondary | ICD-10-CM

## 2017-07-04 LAB — AFP TUMOR MARKER: AFP, Serum, Tumor Marker: 589.7 ng/mL — ABNORMAL HIGH (ref 0.0–8.3)

## 2017-07-05 ENCOUNTER — Encounter: Payer: Self-pay | Admitting: Hematology and Oncology

## 2017-07-05 ENCOUNTER — Ambulatory Visit
Admission: RE | Admit: 2017-07-05 | Discharge: 2017-07-05 | Disposition: A | Discharge status: Home / Self Care | Payer: Medicare Other | Source: Ambulatory Visit | Attending: Hematology and Oncology | Admitting: Hematology and Oncology

## 2017-07-05 ENCOUNTER — Inpatient Hospital Stay (HOSPITAL_BASED_OUTPATIENT_CLINIC_OR_DEPARTMENT_OTHER): Payer: Medicare Other | Admitting: Hematology and Oncology

## 2017-07-05 VITALS — BP 106/63 | HR 86 | Temp 96.8°F | Resp 18 | Wt 131.2 lb

## 2017-07-05 DIAGNOSIS — M069 Rheumatoid arthritis, unspecified: Secondary | ICD-10-CM | POA: Diagnosis not present

## 2017-07-05 DIAGNOSIS — C7951 Secondary malignant neoplasm of bone: Secondary | ICD-10-CM | POA: Diagnosis not present

## 2017-07-05 DIAGNOSIS — C3411 Malignant neoplasm of upper lobe, right bronchus or lung: Secondary | ICD-10-CM

## 2017-07-05 DIAGNOSIS — I81 Portal vein thrombosis: Secondary | ICD-10-CM

## 2017-07-05 DIAGNOSIS — B1910 Unspecified viral hepatitis B without hepatic coma: Secondary | ICD-10-CM

## 2017-07-05 DIAGNOSIS — Z7901 Long term (current) use of anticoagulants: Secondary | ICD-10-CM

## 2017-07-05 DIAGNOSIS — Z66 Do not resuscitate: Secondary | ICD-10-CM | POA: Diagnosis not present

## 2017-07-05 DIAGNOSIS — C3490 Malignant neoplasm of unspecified part of unspecified bronchus or lung: Secondary | ICD-10-CM | POA: Insufficient documentation

## 2017-07-05 DIAGNOSIS — D721 Eosinophilia: Secondary | ICD-10-CM | POA: Diagnosis not present

## 2017-07-05 DIAGNOSIS — I998 Other disorder of circulatory system: Secondary | ICD-10-CM | POA: Diagnosis not present

## 2017-07-05 DIAGNOSIS — C787 Secondary malignant neoplasm of liver and intrahepatic bile duct: Secondary | ICD-10-CM

## 2017-07-05 DIAGNOSIS — Z87891 Personal history of nicotine dependence: Secondary | ICD-10-CM

## 2017-07-05 DIAGNOSIS — R948 Abnormal results of function studies of other organs and systems: Secondary | ICD-10-CM

## 2017-07-05 DIAGNOSIS — Z79899 Other long term (current) drug therapy: Secondary | ICD-10-CM

## 2017-07-05 DIAGNOSIS — B1920 Unspecified viral hepatitis C without hepatic coma: Secondary | ICD-10-CM

## 2017-07-05 DIAGNOSIS — R772 Abnormality of alphafetoprotein: Secondary | ICD-10-CM

## 2017-07-05 DIAGNOSIS — I700 Atherosclerosis of aorta: Secondary | ICD-10-CM | POA: Insufficient documentation

## 2017-07-05 DIAGNOSIS — I820 Budd-Chiari syndrome: Secondary | ICD-10-CM

## 2017-07-05 DIAGNOSIS — Z808 Family history of malignant neoplasm of other organs or systems: Secondary | ICD-10-CM

## 2017-07-05 DIAGNOSIS — R160 Hepatomegaly, not elsewhere classified: Secondary | ICD-10-CM | POA: Diagnosis not present

## 2017-07-05 DIAGNOSIS — Z7189 Other specified counseling: Secondary | ICD-10-CM

## 2017-07-05 LAB — HEPATITIS C VRS RNA DETECT BY PCR-QUAL: Hepatitis C Vrs RNA by PCR-Qual: POSITIVE — AB

## 2017-07-05 MED ORDER — GADOBENATE DIMEGLUMINE 529 MG/ML IV SOLN
15.0000 mL | Freq: Once | INTRAVENOUS | Status: AC | PRN
  Administered 2017-07-05: 11 mL via INTRAVENOUS

## 2017-07-05 NOTE — Progress Notes (Signed)
[facility] day:  07/05/2017   Chief Complaint: Gerald [name] is a 66 y.o. male with stage IV adenocarcinoma of the lung with associated hypereosinophilia who is seen for review of interval abdominal MRI and discussion regarding direction of therapy.  HPI:  The patient was last seen in the medical oncology clinic on 07/03/2017.  At that time, CT scans revealed an increase in the left hepatic vein thrombosis with progressive heterogeneity and enhancement in the left hepatic lobe. There was a new 1.5 cm lateral left hepatic lobe lesion versus focal area of heterogeneity. Decision was made to proceed with liver MRI.  He was also noted to be hepatitis B and C positive at last visit raising the possibility of hepatcellular carcinoma.  AFP was 589.7 on 07/03/2017.  Abdominal MRI on 07/05/2017 revealed persistent mass within lateral segment of left lobe of liver with progressive thrombosis of the left portal vein. There was also persistent thrombosis of the left hepatic vein. Portal vein thrombus extended up to the level of the main portal vein and was likely tumor thrombus. The liver has morphologic features suggestive of early cirrhosis. All findings are highly suggestive that the lesion in the left lobe of liver represents hepatoma rather than lung cancer metastasis. The lesion within segment 8 of the liver had decreased in size when compared with 04/13/2017.  The small lesion within segment 7 of the liver appeared new from previous exam. Indeterminate. This may represent an area of lung cancer metastasis or possibly multifocal HCC  Symptomatically, he denies any new complaint.   Past Medical History:  Diagnosis Date  . Arthritis    Rheumatoid arthritis  . Cancer ([place])    lung ca   . Collagen vascular disease (HCC)    RA  . Mass of lung   . Pneumothorax    spontaneous  . Renal disorder    as a child    Past Surgical History:  Procedure  Laterality Date  . FLEXIBLE BRONCHOSCOPY N/A 03/30/2016   Procedure: FLEXIBLE BRONCHOSCOPY;  Surgeon: Vilinda Boehringer, MD;  Location: [facility] ORS;  Service: Cardiopulmonary;  Laterality: N/A;  . Ureter repair as a child      Family History  Problem Relation Age of Onset  . Brain cancer Mother   . Lung cancer Maternal Uncle   . Cancer Sister        Metastatic cancer- unknown primary    Social History:  reports that he quit smoking about 17 years ago. His smoking use included Cigarettes. He has a 13.00 pack-year smoking history. He has never used smokeless tobacco. He reports that he drinks alcohol. He reports that he does not use drugs.  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  He lives in [place].  Contact number is [phone].  He has a great pyrenees.  He has been working on his truck.  The patient is alone today.  Allergies:  Allergies  Allergen Reactions  . Nsaids Other (See Comments)    Pt is unable to take this class of medications.    . Sulfa Antibiotics Other (See Comments)    Reaction:  GI upset     Current Medications: Current Outpatient Prescriptions  Medication Sig Dispense Refill  . folic acid (FOLVITE) 1 MG tablet Take 1 tablet (1 mg total) by mouth daily. 30 tablet 3  . gabapentin (NEURONTIN) 300 MG capsule Take 1 capsule (  300 mg total) by mouth at bedtime. 30 capsule 2  . HYDROcodone-acetaminophen (NORCO) 7.5-325 MG tablet Take 1 tablet by mouth every 4 (four) hours as needed for moderate pain (Every 4-6 hours as needed for pain). 30 tablet 0  . ondansetron (ZOFRAN) 8 MG tablet Take 1 tablet (8 mg total) by mouth 2 (two) times daily as needed for nausea or vomiting. 20 tablet 0  . oxyCODONE (OXY IR/ROXICODONE) 5 MG immediate release tablet Take 1 tablet (5 mg total) by mouth every 4 (four) hours as needed for severe pain. 30 tablet 0  . oxyCODONE-acetaminophen (PERCOCET/ROXICET) 5-325 MG tablet Take 1 tablet by  mouth every 6 (six) hours as needed for severe pain. 30 tablet 0  . rivaroxaban (XARELTO) 20 MG TABS tablet Take 1 tablet (20 mg total) by mouth daily with supper. 30 tablet 1  . sucralfate (CARAFATE) 1 g tablet Take 1 tablet (1 g total) by mouth 3 (three) times daily. Dissolve in 2-3 tbsp warm water, swish and swallow. 90 tablet 3  . calcium-vitamin D (OSCAL WITH D) 500-200 MG-UNIT tablet Take 1 tablet by mouth 2 (two) times daily. 180 tablet 0   No current facility-administered medications for this visit.     Review of Systems:  GENERAL:  Feels "fine". No fever, chills or sweats.  Weight down 1 pound. PERFORMANCE STATUS (ECOG):  2 HEENT: No visual changes, sore throat, mouth sores or tenderness. Lungs:  No shortness of breath or cough. No hemoptysis. Cardiac:  No chest pain, palpitations, orthopnea, or PND.  Sleeps in a recliner. GI:  Appetite good.  No nausea, vomiting, diarrhea, constipation, melena or hematochezia. GU:  No urgency, frequency, dysuria, or hematuria. Musculoskeletal:  Chronic right knee pain. No back pain.  No muscle tenderness. Extremities:  No pain or swelling. Skin:  Poison ivy rash, resolved. Neuro:  Neuropathy (stable) on gabapentin.  No headache, focal weakness, balance or coordination issues. Endocrine:  No diabetes, thyroid issues, hot flashes or night sweats. Psych:  No mood changes, depression or anxiety. Pain:  No pain.  Review of systems:  All other systems reviewed and found to be negative.  Physical Exam: Blood pressure 106/63, pulse 86, temperature (!) 96.8 F (36 C), temperature source Tympanic, resp. rate 18, weight 131 lb 3 oz (59.5 kg). GENERAL:  Thin gentleman sitting comfortably in the exam room in no acute distress. MENTAL STATUS:  Alert and oriented to person, place and time.  HEAD:Waering a cap.  Long gray hair in pony tail. [name]. Temporal wasting. Normocephalic, atraumatic, face symmetric, no Cushingoid features. EYES:Blue  eyes. No conjunctivitis or scleral icterus. NEUROLOGICAL: Unremarkable. PSYCH: Appropriate.   Imaging studies: 03/27/2016:  Chest CT angiogram revealed an 8 cm mass in the medial right upper lobe. The mass abutted the mediastinum and possibly invaded the left atrium. There was associated widespread thoracic adenopathy. There was a 2.6 cm enhancing lesion in the medial segment of the left hepatic lobe. 04/05/2016:  PET scan revealed intense hypermetabolism (SUV 17.95) associated with a 7.5 cm central perihilar right lung lesion. There was mediastinal (sub-carinal node SUV 8.89; 2.6 cm right paratracheal node SUV 17.49) and upper abdominal retroperitoneal hypermetabolic adenopathy (9 mm aortocaval node SUV 9.2). There were 2 liver metastasis (2.3 cm left lobe SUV 6.19; 2.2 cm segment VIII SUV 5.18). There are multifocal bone metastasis (C2 vertebral body SUV 9.0; 1.6 cm left sacral ala lesion SUV of 8.6).  03/31/2016:  Head MRI was indeterminate for early metastatic disease to the  brain. There was a small focus of gyral enhancement in the anterior right frontal lobe likely is post ischemic, while a small posterior right cerebellar enhancing lesion was more suspicious for small brain metastasis. There were multiple small primarily subacute appearing infarcts in the bilateral MCA and left PICA territories.  04/07/2016:  Bone scan revealed a subtle distal right femur lesion which might be a small metastasis.  Bone scan was felt to be an unreliable modality for evaluation of osseous metastatic disease as the bone metastases seen on the recent PET and MRI were not evident. 05/12/2016:  Head MRI revealed expected interval evolution of bilateral cerebral in cerebellar infarcts. There was resolution of right frontal and right cerebellar enhancement consistent with subacute infarcts. There was a new 3 mm focus of gyral enhancement in the left occipital lobe and a possible new 4 mm enhancing lesion in the  left cerebellum (? vascular enhancement versus tiny metastasis).  08/16/2016:  Head MRI revealed multiple areas of improving enhancement and restricted diffusion compatible with resolving subacute infarcts.  There was no evidence of metastatic disease or acute infarction  10/19/2016:  Chest, abdomen, and pelvic CT scan revealed radiation changes in the right lung.  The superimposed right lung pneumonia had improved.  The 6.4 x 2.4 cm right perihilar mass had decreased.  There was improving thoracic lymphadenopathy.  There was progression of hepatic metastases, measuring up to 4.7 cm.  Specifically, there was a 3.9 x 3.7 cm metastasis in segment 8 (previously 2.5 x 2.8 cm) and a 4.7 x 4.0 cm metastasis in segment 4B (previously 3.1 x 2.6 cm). 11/14/2016:  PET scan revealed 2 liver masses. Both were significantly more hypermetabolic than on 36/62/9476 and have enlarged compared to the prior PET-CT.  The segment 8 lesion was centrally necrotic but with high activity along its periphery (7.4), and about the same size as it was on 10/19/2016. The lateral segment left hepatic lobe lesion had enlarged (5.9 x 5.3 cm) compared to 10/19/16 (4.1 x 4.4 cm).  The bony metastatic lesions were no longer hypermetabolic and have resolved.  There were post therapy and postoperative findings in the right lung, with a considerable amount of suspected radiation pneumonitis and radiation fibrosis. 12/29/2016:  Chest, abdomen, and pelvic CT revealed mild reduction in size and central necrosis of the hepatic metastatic lesions.  There was continued post therapy related findings along the right hilar region. Dominant right perihilar mass was reduced in thickness compared to 10/19/2016.  04/13/2017:  Chest, abdomen, and pelvic CT revealed new left hepatic vein thrombosis. This somewhat obscured the known left hepatic lobe tumor although overall the left hepatic lobe masses were thought to be similar in size compared the prior exam. The  segment 8 lesion was less well seen than on the prior exam.  The central necrotic portion was seen but the peripheral rim was much less conspicuous (could represent improvement in this tumor).  There was mild increase in adenopathy in the gastrohepatic ligament.  There was essentially stable appearance in the lungs with considerable radiation pneumonitis, scarring, and volume loss in the right lung, along with a trace loculated pleural effusion with enhancing margins.  There was stable appearance of prior splenic infarcts and prior scarring in the left mid kidney. 07/02/2017:  Chest, abdomen, and pelvic CT revealed interval increase in the left hepatic vein thrombosis with progressive heterogeneity and enhancement of the left hepatic lobe, predominantly obscuring the known left hepatic lobe lesion. There was a possible new 1.5  cm lateral left hepatic lobe lesion versus focal area of heterogeneity. There was slight interval decrease in the size of the irregular low-attenuation lesion within segment 8 of the liver. Right lung was stable in appearance with interval development of consolidation within the lingula representing atelectasis or infection. 07/05/2017:  Abdominal MRI revealed persistent mass within lateral segment of left lobe of liver with progressive thrombosis of the left portal vein. There was also persistent thrombosis of the left hepatic vein. Portal vein thrombus extended up to the level of the main portal vein and was likely tumor thrombus. The liver has morphologic features suggestive of early cirrhosis. All findings are highly suggestive that the lesion in the left lobe of liver represents hepatoma rather than lung cancer metastasis. The lesion within segment 8 of the liver had decreased in size when compared with 04/13/2017.  The small lesion within segment 7 of the liver appeared new from previous exam. Indeterminate. This may represent an area of lung cancer metastasis or possibly multifocal  [place]   Appointment on 07/03/2017  Component Date Value Ref Range Status  . WBC 07/03/2017 7.4  3.8 - 10.6 K/uL Final  . RBC 07/03/2017 4.11* 4.40 - 5.90 MIL/uL Final  . Hemoglobin 07/03/2017 13.5  13.0 - 18.0 g/dL Final  . HCT 07/03/2017 39.3* 40.0 - 52.0 % Final  . MCV 07/03/2017 95.7  80.0 - 100.0 fL Final  . MCH 07/03/2017 32.8  26.0 - 34.0 pg Final  . MCHC 07/03/2017 34.3  32.0 - 36.0 g/dL Final  . RDW 07/03/2017 13.6  11.5 - 14.5 % Final  . Platelets 07/03/2017 216  150 - 440 K/uL Final  . Neutrophils Relative % 07/03/2017 56  % Final  . Neutro Abs 07/03/2017 4.2  1.4 - 6.5 K/uL Final  . Lymphocytes Relative 07/03/2017 20  % Final  . Lymphs Abs 07/03/2017 1.5  1.0 - 3.6 K/uL Final  . Monocytes Relative 07/03/2017 15  % Final  . Monocytes Absolute 07/03/2017 1.1* 0.2 - 1.0 K/uL Final  . Eosinophils Relative 07/03/2017 7  % Final  . Eosinophils Absolute 07/03/2017 0.5  0 - 0.7 K/uL Final  . Basophils Relative 07/03/2017 2  % Final  . Basophils Absolute 07/03/2017 0.2* 0 - 0.1 K/uL Final  . Sodium 07/03/2017 137  135 - 145 mmol/L Final  . Potassium 07/03/2017 4.1  3.5 - 5.1 mmol/L Final  . Chloride 07/03/2017 104  101 - 111 mmol/L Final  . CO2 07/03/2017 28  22 - 32 mmol/L Final  . Glucose, Bld 07/03/2017 133* 65 - 99 mg/dL Final  . BUN 07/03/2017 18  6 - 20 mg/dL Final  . Creatinine, Ser 07/03/2017 0.80  0.61 - 1.24 mg/dL Final  . Calcium 07/03/2017 9.2  8.9 - 10.3 mg/dL Final  . Total Protein 07/03/2017 8.1  6.5 - 8.1 g/dL Final  . Albumin 07/03/2017 3.4* 3.5 - 5.0 g/dL Final  . AST 07/03/2017 108* 15 - 41 U/L Final  . ALT 07/03/2017 93* 17 - 63 U/L Final  . Alkaline Phosphatase 07/03/2017 82  38 - 126 U/L Final  . Total Bilirubin 07/03/2017 0.7  0.3 - 1.2 mg/dL Final  . GFR calc non Af Amer 07/03/2017 >60  >60 mL/min Final  . GFR calc Af Amer 07/03/2017 >60  >60 mL/min Final   Comment: (NOTE) The eGFR has been calculated using the CKD EPI equation. This calculation has  not been validated in all clinical situations. eGFR's persistently <60 mL/min signify possible Chronic  Kidney Disease.   . Anion gap 07/03/2017 5  5 - 15 Final  . Magnesium 07/03/2017 1.8  1.7 - 2.4 mg/dL Final  . Hepatitis C Vrs RNA by PCR-Qual 07/03/2017 Positive* Negative Final   Comment: (NOTE) Positive: HCV RNA Detected Performed At: [facility] [place], [place] [id number] Lindon Romp MD YT:[id number]   . AFP, Serum, Tumor Marker 07/03/2017 589.7* 0.0 - 8.3 ng/mL Final   Comment: (NOTE) Roche ECLIA methodology Performed At: [facility] [place], [place] [id number] Lindon Romp MD BS:[id number]     Assessment:  Morrison Mcbryar is a 66 y.o. male with stage IV adenocarcinoma of the lung and associated leukocytosis with eosinophilia. He presented with a 30 pound weight loss, shortness of breath, cough, and left leg discomfort. He has a 13-15 pack year smoking history.  Bronchoscopy on 03/30/2016 revealed an endobronchial lesion in the RUL anterior segment with 95% occlusion. There was extrinsic compression in the right middle lobe secondary to posterior mass effect. Pathology confirmed non-small cell lung cancer, favor adenocarcinoma.  PD-L1 testing revealed high expression (> 50%).  EGFR, ALK, and ROS1 were negative.  CEA was 2.3 on 03/27/2016.  PET scan on 04/05/2016 revealed intense hypermetabolism (SUV 17.95) associated with a 7.5 cm central perihilar right lung lesion. There was mediastinal (sub-carinal node SUV 8.89; 2.6 cm right paratracheal node SUV 17.49) and upper abdominal retroperitoneal hypermetabolic adenopathy (9 mm aortocaval node SUV 9.2). There were 2 liver metastasis (2.3 cm left lobe SUV 6.19; 2.2 cm segment VIII SUV 5.18). There are multifocal bone metastasis (C2 vertebral body SUV 9.0; 1.6 cm left sacral ala lesion SUV of 8.6).   He had marked leukocytosis with hypereosinophilia. Initial WBC was 72,300 with  53% eosinophils. Bone marrow on 03/29/2016 revealed marked increase in marrow eosinophils (approximate 30%). There was no diagnostic morphologic evidence of a myeloproliferative or lymphoid neoplasm. Flow cytometry revealed significant increase in eosinophils (54%). There was relative decreased myeloid cells with no significant immunophenotypic abnormalities or increase in blasts. There was no lymphoid abnormalities or evidence of clonality. FISH studies were negative for myeloproliferative neoplasms with eosinophilia (PDGFRA, PDGFRB, FGFR1). BCR-ABL was negative on 03/28/2016.  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  Left lower extremity duplex on 03/25/2016 revealed no evidence of DVT. Discomfort improved on Neurontin 300 mg a day and continues to improve with declining eosinophilia.  He received 41.4 Gy from 04/11/2016 - 05/23/2016.  He received 4 weeks of concurrent carboplatin and Taxol (04/17/2016 - 05/22/2016).    He has received 15 cycles of Keytruda (07/03/2016 - 10/12/2016; 11/22/2016 - 06/05/2017).  He has tolerated treatment well.  He receives [place] every 6 weeks (began 05/22/2016; last 06/26/2017).  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  He began Xarelto on 04/13/2017.  He has increased liver function tests.  Etiology is possibly secondary to acute hepatitis, irritation due to thrombosis, or progressive disease (metastatic liver lesion or [place]).  He is hepatitis B core antibody and hepatitis C antibody positive.  AFP was 589.7 on 07/03/2017.  Chest, abdomen, and pelvic CT on 07/02/2017 revealed interval increase in the left hepatic vein thrombosis with progressive heterogeneity and enhancement of the left hepatic lobe, predominantly obscuring the known left hepatic lobe lesion. There was a possible new 1.5 cm lateral left hepatic lobe lesion versus focal area of heterogeneity. There was slight  interval decrease in the size of  the irregular low-attenuation lesion within segment 8 of the liver. Right lung was stable in appearance with interval development of consolidation within the lingula representing atelectasis or infection.  Abdominal MRI on 07/05/2017 revealed persistent mass within lateral segment of left lobe of liver with progressive thrombosis of the left portal vein. There was also persistent thrombosis of the left hepatic vein. Portal vein thrombus extended up to the level of the main portal vein and was likely tumor thrombus. The liver has morphologic features suggestive of early cirrhosis. All findings are highly suggestive that the lesion in the left lobe of liver represents hepatoma rather than lung cancer metastasis. The lesion within segment 8 of the liver had decreased in size when compared with 04/13/2017.  The small lesion within segment 7 of the liver appeared new from previous exam. Indeterminate. This may represent an area of lung cancer metastasis or possibly multifocal HCC  Symptomatically, he feels good.  Imaging reveals left hepatic vein thrombosis and left portal vein thrombosis.  Code status is DNR/DNI.  Plan: 1. Discuss liver MRI.  Findings are worrisome for hepatocellular carcinoma.  Discuss tumor board discussions.  Recommendation for liver biopsy to confirm [place].  Patient would need to be off anticoagulation for procedure. 2.  Discuss concern for increasing thrombosis.  Discuss switch to Lovenox given long term data with malignancies and ease of discontinuation for procedures.  Patient considering.  He stated that he would "get back to [place] early next week" (Monday or Tuesday). 3.  Patient to call by Tuesday at noon regarding his decision about treatment. 4.  Continue Xarelto. 5.  Await insurance clearance re: Lovenox. 6.  Schedule SQ teaching for patient. 7.  RTC for MD assessment within 1 week (await above; possible biopsy).   Lequita Asal, MD   07/05/2017, 4:07 PM

## 2017-07-05 NOTE — Progress Notes (Signed)
Patient here today for MRI results.  Offers no complaints.

## 2017-07-06 ENCOUNTER — Other Ambulatory Visit: Payer: Self-pay | Admitting: Hematology and Oncology

## 2017-07-06 ENCOUNTER — Other Ambulatory Visit: Payer: Self-pay | Admitting: *Deleted

## 2017-07-06 DIAGNOSIS — R160 Hepatomegaly, not elsewhere classified: Secondary | ICD-10-CM

## 2017-07-07 DIAGNOSIS — R748 Abnormal levels of other serum enzymes: Secondary | ICD-10-CM | POA: Insufficient documentation

## 2017-07-07 DIAGNOSIS — R772 Abnormality of alphafetoprotein: Secondary | ICD-10-CM | POA: Insufficient documentation

## 2017-07-07 DIAGNOSIS — I81 Portal vein thrombosis: Secondary | ICD-10-CM | POA: Insufficient documentation

## 2017-07-09 ENCOUNTER — Other Ambulatory Visit: Payer: Self-pay | Admitting: *Deleted

## 2017-07-09 DIAGNOSIS — C787 Secondary malignant neoplasm of liver and intrahepatic bile duct: Secondary | ICD-10-CM

## 2017-07-09 DIAGNOSIS — C3411 Malignant neoplasm of upper lobe, right bronchus or lung: Secondary | ICD-10-CM

## 2017-07-09 MED ORDER — ENOXAPARIN SODIUM 60 MG/0.6ML ~~LOC~~ SOLN: 1.0000 mg/kg | Freq: Two times a day (BID) | SUBCUTANEOUS | 2 refills | Status: DC

## 2017-07-10 ENCOUNTER — Encounter: Payer: Self-pay | Admitting: Hematology and Oncology

## 2017-07-10 ENCOUNTER — Other Ambulatory Visit: Payer: Self-pay | Admitting: Hematology and Oncology

## 2017-07-10 ENCOUNTER — Inpatient Hospital Stay (HOSPITAL_BASED_OUTPATIENT_CLINIC_OR_DEPARTMENT_OTHER): Payer: Medicare Other | Admitting: Hematology and Oncology

## 2017-07-10 ENCOUNTER — Telehealth: Payer: Self-pay | Admitting: *Deleted

## 2017-07-10 ENCOUNTER — Other Ambulatory Visit: Payer: Self-pay | Admitting: *Deleted

## 2017-07-10 VITALS — BP 113/80 | HR 108 | Temp 96.5°F | Resp 18 | Wt 130.4 lb

## 2017-07-10 DIAGNOSIS — R948 Abnormal results of function studies of other organs and systems: Secondary | ICD-10-CM | POA: Diagnosis not present

## 2017-07-10 DIAGNOSIS — Z7901 Long term (current) use of anticoagulants: Secondary | ICD-10-CM

## 2017-07-10 DIAGNOSIS — I820 Budd-Chiari syndrome: Secondary | ICD-10-CM

## 2017-07-10 DIAGNOSIS — M069 Rheumatoid arthritis, unspecified: Secondary | ICD-10-CM

## 2017-07-10 DIAGNOSIS — Z66 Do not resuscitate: Secondary | ICD-10-CM | POA: Diagnosis not present

## 2017-07-10 DIAGNOSIS — R772 Abnormality of alphafetoprotein: Secondary | ICD-10-CM

## 2017-07-10 DIAGNOSIS — Z808 Family history of malignant neoplasm of other organs or systems: Secondary | ICD-10-CM

## 2017-07-10 DIAGNOSIS — Z87891 Personal history of nicotine dependence: Secondary | ICD-10-CM | POA: Diagnosis not present

## 2017-07-10 DIAGNOSIS — I81 Portal vein thrombosis: Secondary | ICD-10-CM

## 2017-07-10 DIAGNOSIS — D721 Eosinophilia: Secondary | ICD-10-CM | POA: Diagnosis not present

## 2017-07-10 DIAGNOSIS — C787 Secondary malignant neoplasm of liver and intrahepatic bile duct: Secondary | ICD-10-CM

## 2017-07-10 DIAGNOSIS — Z7189 Other specified counseling: Secondary | ICD-10-CM

## 2017-07-10 DIAGNOSIS — C3411 Malignant neoplasm of upper lobe, right bronchus or lung: Secondary | ICD-10-CM | POA: Diagnosis not present

## 2017-07-10 DIAGNOSIS — C7951 Secondary malignant neoplasm of bone: Secondary | ICD-10-CM

## 2017-07-10 DIAGNOSIS — Z79899 Other long term (current) drug therapy: Secondary | ICD-10-CM | POA: Diagnosis not present

## 2017-07-10 DIAGNOSIS — B1910 Unspecified viral hepatitis B without hepatic coma: Secondary | ICD-10-CM

## 2017-07-10 DIAGNOSIS — I998 Other disorder of circulatory system: Secondary | ICD-10-CM | POA: Diagnosis not present

## 2017-07-10 DIAGNOSIS — B1920 Unspecified viral hepatitis C without hepatic coma: Secondary | ICD-10-CM

## 2017-07-10 LAB — HCV RNA QUANT RFLX ULTRA OR GENOTYP
HCV RNA Qnt(log copy/mL): 5.474 log10 IU/mL
HepC Qn: 298000 IU/mL

## 2017-07-10 LAB — HEPATITIS C GENOTYPE

## 2017-07-10 NOTE — Telephone Encounter (Signed)
Patient given SQ teaching for lovenox by Honor Loh NP last week, reviewed SQ teaching today with patient, voiced understanding.  Instructed patient that we needed for him to apply for medicaid for prescription coverage because of his lack of medicare part D and inability to pay for the medication.

## 2017-07-10 NOTE — Progress Notes (Signed)
[facility] day:  07/10/2017   Chief Complaint: Gerald [name] is a 66 y.o. male with stage IV adenocarcinoma of the lung who is seen for further discussion regarding direction of therapy.  HPI:  The patient was last seen in the medical oncology clinic on 07/05/2017.  At that time, abdominal MRI revealed findings worrisome for hepatocellular carcinoma.  He is hepatitis B and C positive.  AFP was 589.  We discussed consideration of biopsy.  Imaging revealed increasing thrombosis.  We discussed switch from Xarelto to Lovenox.  Preauthorization was pending.  During the interim, he denies any new complaints.  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   Past Medical History:  Diagnosis Date  . Arthritis    Rheumatoid arthritis  . Cancer ([facility])    lung ca   . Collagen vascular disease (H[facility])    RA  . Mass of lung   . Pneumothorax    spontaneous  . Renal disorder    as a child    Past Surgical History:  Procedure Laterality Date  . FLEXIBLE BRONCHOSCOPY N/A 03/30/2016   Procedure: FLEXIBLE BRONCHOSCOPY;  Surgeon: Vilinda Boehringer, MD;  Location: [facility] ORS;  Service: Cardiopulmonary;  Laterality: N/A;  . Ureter repair as a child      Family History  Problem Relation Age of Onset  . Brain cancer Mother   . Lung cancer Maternal Uncle   . Cancer Sister        Metastatic cancer- unknown primary    Social History:  reports that he quit smoking about 17 years ago. His smoking use included Cigarettes. He has a 13.00 pack-year smoking history. He has never used smokeless tobacco. He reports that he drinks alcohol. He reports that he does not use drugs.  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  He lives in [place].  Contact number is [phone].  He has a great pyrenees.  He has been working on his truck.  The patient is accompanied by his daughter today.  Allergies:  Allergies   Allergen Reactions  . Nsaids Other (See Comments)    Pt is unable to take this class of medications.    . Sulfa Antibiotics Other (See Comments)    Reaction:  GI upset     Current Medications: Current Outpatient Prescriptions  Medication Sig Dispense Refill  . enoxaparin (LOVENOX) 60 MG/0.6ML injection Inject 0.6 mLs (60 mg total) into the skin every 12 (twelve) hours. 60 Syringe 2  . folic acid (FOLVITE) 1 MG tablet Take 1 tablet (1 mg total) by mouth daily. 30 tablet 3  . gabapentin (NEURONTIN) 300 MG capsule Take 1 capsule (300 mg total) by mouth at bedtime. 30 capsule 2  . HYDROcodone-acetaminophen (NORCO) 7.5-325 MG tablet Take 1 tablet by mouth every 4 (four) hours as needed for moderate pain (Every 4-6 hours as needed for pain). 30 tablet 0  . ondansetron (ZOFRAN) 8 MG tablet Take 1 tablet (8 mg total) by mouth 2 (two) times daily as needed for nausea or vomiting. 20 tablet 0  . oxyCODONE (OXY IR/ROXICODONE) 5 MG immediate release tablet Take 1 tablet (5 mg total) by mouth every 4 (four) hours as needed for severe pain. 30 tablet 0  . oxyCODONE-acetaminophen (PERCOCET/ROXICET) 5-325 MG tablet Take 1 tablet by mouth every 6 (six) hours as needed for severe pain. 30 tablet 0  . rivaroxaban (XARELTO)  20 MG TABS tablet Take 1 tablet (20 mg total) by mouth daily with supper. 30 tablet 1  . sucralfate (CARAFATE) 1 g tablet Take 1 tablet (1 g total) by mouth 3 (three) times daily. Dissolve in 2-3 tbsp warm water, swish and swallow. 90 tablet 3  . calcium-vitamin D (OSCAL WITH D) 500-200 MG-UNIT tablet Take 1 tablet by mouth 2 (two) times daily. 180 tablet 0   No current facility-administered medications for this visit.     Review of Systems:  GENERAL:  Feels "fine". No fever, chills or sweats.  Weight down 1 pound. PERFORMANCE STATUS (ECOG):  2 HEENT: No visual changes, sore throat, mouth sores or tenderness. Lungs:  No shortness of breath or cough. No hemoptysis. Cardiac:  No chest  pain, palpitations, orthopnea, or PND.  Sleeps in a recliner. GI:  Appetite good.  No nausea, vomiting, diarrhea, constipation, melena or hematochezia. GU:  No urgency, frequency, dysuria, or hematuria. Musculoskeletal:  Chronic right knee pain. No back pain.  No muscle tenderness. Extremities:  No pain or swelling. Skin:  No rashes or ulcers. Neuro:  Neuropathy (stable) on gabapentin.  No headache, focal weakness, balance or coordination issues. Endocrine:  No diabetes, thyroid issues, hot flashes or night sweats. Psych:  No mood changes, depression or anxiety. Pain:  No pain.  Review of systems:  All other systems reviewed and found to be negative.  Physical Exam: Blood pressure 113/80, pulse (!) 108, temperature (!) 96.5 F (35.8 C), temperature source Tympanic, resp. rate 18, weight 130 lb 6 oz (59.1 kg). GENERAL:  Thin gentleman sitting comfortably in the exam room in no acute distress. MENTAL STATUS:  Alert and oriented to person, place and time.  HEAD:Waering a cap.  Long gray hair in pony tail. [name]. Temporal wasting. Normocephalic, atraumatic, face symmetric, no Cushingoid features. EYES:Blue eyes. No conjunctivitis or scleral icterus. NEUROLOGICAL: Unremarkable. PSYCH: Appropriate.   Imaging studies: 03/27/2016:  Chest CT angiogram revealed an 8 cm mass in the medial right upper lobe. The mass abutted the mediastinum and possibly invaded the left atrium. There was associated widespread thoracic adenopathy. There was a 2.6 cm enhancing lesion in the medial segment of the left hepatic lobe. 04/05/2016:  PET scan revealed intense hypermetabolism (SUV 17.95) associated with a 7.5 cm central perihilar right lung lesion. There was mediastinal (sub-carinal node SUV 8.89; 2.6 cm right paratracheal node SUV 17.49) and upper abdominal retroperitoneal hypermetabolic adenopathy (9 mm aortocaval node SUV 9.2). There were 2 liver metastasis (2.3 cm left lobe SUV 6.19; 2.2 cm  segment VIII SUV 5.18). There are multifocal bone metastasis (C2 vertebral body SUV 9.0; 1.6 cm left sacral ala lesion SUV of 8.6).  03/31/2016:  Head MRI was indeterminate for early metastatic disease to the brain. There was a small focus of gyral enhancement in the anterior right frontal lobe likely is post ischemic, while a small posterior right cerebellar enhancing lesion was more suspicious for small brain metastasis. There were multiple small primarily subacute appearing infarcts in the bilateral MCA and left PICA territories.  04/07/2016:  Bone scan revealed a subtle distal right femur lesion which might be a small metastasis.  Bone scan was felt to be an unreliable modality for evaluation of osseous metastatic disease as the bone metastases seen on the recent PET and MRI were not evident. 05/12/2016:  Head MRI revealed expected interval evolution of bilateral cerebral in cerebellar infarcts. There was resolution of right frontal and right cerebellar enhancement consistent with subacute infarcts.  There was a new 3 mm focus of gyral enhancement in the left occipital lobe and a possible new 4 mm enhancing lesion in the left cerebellum (? vascular enhancement versus tiny metastasis).  08/16/2016:  Head MRI revealed multiple areas of improving enhancement and restricted diffusion compatible with resolving subacute infarcts.  There was no evidence of metastatic disease or acute infarction  10/19/2016:  Chest, abdomen, and pelvic CT scan revealed radiation changes in the right lung.  The superimposed right lung pneumonia had improved.  The 6.4 x 2.4 cm right perihilar mass had decreased.  There was improving thoracic lymphadenopathy.  There was progression of hepatic metastases, measuring up to 4.7 cm.  Specifically, there was a 3.9 x 3.7 cm metastasis in segment 8 (previously 2.5 x 2.8 cm) and a 4.7 x 4.0 cm metastasis in segment 4B (previously 3.1 x 2.6 cm). 11/14/2016:  PET scan revealed 2 liver  masses. Both were significantly more hypermetabolic than on 52/77/8242 and have enlarged compared to the prior PET-CT.  The segment 8 lesion was centrally necrotic but with high activity along its periphery (7.4), and about the same size as it was on 10/19/2016. The lateral segment left hepatic lobe lesion had enlarged (5.9 x 5.3 cm) compared to 10/19/16 (4.1 x 4.4 cm).  The bony metastatic lesions were no longer hypermetabolic and have resolved.  There were post therapy and postoperative findings in the right lung, with a considerable amount of suspected radiation pneumonitis and radiation fibrosis. 12/29/2016:  Chest, abdomen, and pelvic CT revealed mild reduction in size and central necrosis of the hepatic metastatic lesions.  There was continued post therapy related findings along the right hilar region. Dominant right perihilar mass was reduced in thickness compared to 10/19/2016.  04/13/2017:  Chest, abdomen, and pelvic CT revealed new left hepatic vein thrombosis. This somewhat obscured the known left hepatic lobe tumor although overall the left hepatic lobe masses were thought to be similar in size compared the prior exam. The segment 8 lesion was less well seen than on the prior exam.  The central necrotic portion was seen but the peripheral rim was much less conspicuous (could represent improvement in this tumor).  There was mild increase in adenopathy in the gastrohepatic ligament.  There was essentially stable appearance in the lungs with considerable radiation pneumonitis, scarring, and volume loss in the right lung, along with a trace loculated pleural effusion with enhancing margins.  There was stable appearance of prior splenic infarcts and prior scarring in the left mid kidney. 07/02/2017:  Chest, abdomen, and pelvic CT revealed interval increase in the left hepatic vein thrombosis with progressive heterogeneity and enhancement of the left hepatic lobe, predominantly obscuring the known left  hepatic lobe lesion. There was a possible new 1.5 cm lateral left hepatic lobe lesion versus focal area of heterogeneity. There was slight interval decrease in the size of the irregular low-attenuation lesion within segment 8 of the liver. Right lung was stable in appearance with interval development of consolidation within the lingula representing atelectasis or infection. 07/05/2017:  Abdominal MRI revealed persistent mass within lateral segment of left lobe of liver with progressive thrombosis of the left portal vein. There was also persistent thrombosis of the left hepatic vein. Portal vein thrombus extended up to the level of the main portal vein and was likely tumor thrombus. The liver has morphologic features suggestive of early cirrhosis. All findings are highly suggestive that the lesion in the left lobe of liver represents hepatoma rather than  lung cancer metastasis. The lesion within segment 8 of the liver had decreased in size when compared with 04/13/2017.  The small lesion within segment 7 of the liver appeared new from previous exam. Indeterminate. This may represent an area of lung cancer metastasis or possibly multifocal [place]   No visits with results within 3 Day(s) from this visit.  Latest known visit with results is:  Clinical Support on 07/03/2017  Component Date Value Ref Range Status  . WBC 07/03/2017 7.4  3.8 - 10.6 K/uL Final  . RBC 07/03/2017 4.11* 4.40 - 5.90 MIL/uL Final  . Hemoglobin 07/03/2017 13.5  13.0 - 18.0 g/dL Final  . HCT 07/03/2017 39.3* 40.0 - 52.0 % Final  . MCV 07/03/2017 95.7  80.0 - 100.0 fL Final  . MCH 07/03/2017 32.8  26.0 - 34.0 pg Final  . MCHC 07/03/2017 34.3  32.0 - 36.0 g/dL Final  . RDW 07/03/2017 13.6  11.5 - 14.5 % Final  . Platelets 07/03/2017 216  150 - 440 K/uL Final  . Neutrophils Relative % 07/03/2017 56  % Final  . Neutro Abs 07/03/2017 4.2  1.4 - 6.5 K/uL Final  . Lymphocytes Relative 07/03/2017 20  % Final  . Lymphs Abs 07/03/2017 1.5   1.0 - 3.6 K/uL Final  . Monocytes Relative 07/03/2017 15  % Final  . Monocytes Absolute 07/03/2017 1.1* 0.2 - 1.0 K/uL Final  . Eosinophils Relative 07/03/2017 7  % Final  . Eosinophils Absolute 07/03/2017 0.5  0 - 0.7 K/uL Final  . Basophils Relative 07/03/2017 2  % Final  . Basophils Absolute 07/03/2017 0.2* 0 - 0.1 K/uL Final  . Sodium 07/03/2017 137  135 - 145 mmol/L Final  . Potassium 07/03/2017 4.1  3.5 - 5.1 mmol/L Final  . Chloride 07/03/2017 104  101 - 111 mmol/L Final  . CO2 07/03/2017 28  22 - 32 mmol/L Final  . Glucose, Bld 07/03/2017 133* 65 - 99 mg/dL Final  . BUN 07/03/2017 18  6 - 20 mg/dL Final  . Creatinine, Ser 07/03/2017 0.80  0.61 - 1.24 mg/dL Final  . Calcium 07/03/2017 9.2  8.9 - 10.3 mg/dL Final  . Total Protein 07/03/2017 8.1  6.5 - 8.1 g/dL Final  . Albumin 07/03/2017 3.4* 3.5 - 5.0 g/dL Final  . AST 07/03/2017 108* 15 - 41 U/L Final  . ALT 07/03/2017 93* 17 - 63 U/L Final  . Alkaline Phosphatase 07/03/2017 82  38 - 126 U/L Final  . Total Bilirubin 07/03/2017 0.7  0.3 - 1.2 mg/dL Final  . GFR calc non Af Amer 07/03/2017 >60  >60 mL/min Final  . GFR calc Af Amer 07/03/2017 >60  >60 mL/min Final   Comment: (NOTE) The eGFR has been calculated using the CKD EPI equation. This calculation has not been validated in all clinical situations. eGFR's persistently <60 mL/min signify possible Chronic Kidney Disease.   . Anion gap 07/03/2017 5  5 - 15 Final  . Magnesium 07/03/2017 1.8  1.7 - 2.4 mg/dL Final  . Hepatitis C Vrs RNA by PCR-Qual 07/03/2017 Positive* Negative Final   Comment: (NOTE) Positive: HCV RNA Detected Performed At: [facility] [place], [place] [id number] Lindon Romp MD WI:[id number]   . AFP, Serum, Tumor Marker 07/03/2017 589.7* 0.0 - 8.3 ng/mL Final   Comment: (NOTE) Roche ECLIA methodology Performed At: [facility] [place], [place] [id number] Lindon Romp MD QI:[id number]   . HCV  RNA Qnt(log copy/mL) 07/03/2017 5.474  log10 IU/mL Final  . HepC Qn 07/03/2017 298000  IU/mL Final  . Test Information 07/03/2017 Comment   Final   Comment: (NOTE) The quantitative range of this assay is 15 IU/mL to 100 million IU/mL.   Marland Kitchen Hcv Genotype 07/03/2017 Comment   Final   Comment: (NOTE) To be performed on this specimen. Performed At: [facility] [place], [place] [id number] Lindon Romp MD JM:[id number]   . Hepatitis C Genotype 07/03/2017 1a   Final  . Please Note (HCV): 07/03/2017 Comment   Final   Comment: (NOTE) This test was developed and its performance characteristics determined by [facility].  It has not been cleared or approved by the U.S. Food and Drug Administration. The FDA has determined that such clearance or approval is not necessary. This test is used for clinical purposes.  It should not be regarded as investigational or for research. Performed At: [facility] [address] [place], [place] [id number] Lindon Romp MD JH:[id number]     Assessment:  Gerald [name] is a 66 y.o. male with stage IV adenocarcinoma of the lung and associated leukocytosis with eosinophilia. He presented with a 30 pound weight loss, shortness of breath, cough, and left leg discomfort. He has a 13-15 pack year smoking history.  Bronchoscopy on 03/30/2016 revealed an endobronchial lesion in the RUL anterior segment with 95% occlusion. There was extrinsic compression in the right middle lobe secondary to posterior mass effect. Pathology confirmed non-small cell lung cancer, favor adenocarcinoma.  PD-L1 testing revealed high expression (> 50%).  EGFR, ALK, and ROS1 were negative.  CEA was 2.3 on 03/27/2016.  PET scan on 04/05/2016 revealed intense hypermetabolism (SUV 17.95) associated with a 7.5 cm central perihilar right lung lesion. There was mediastinal (sub-carinal node SUV 8.89; 2.6 cm right paratracheal node SUV 17.49) and upper abdominal  retroperitoneal hypermetabolic adenopathy (9 mm aortocaval node SUV 9.2). There were 2 liver metastasis (2.3 cm left lobe SUV 6.19; 2.2 cm segment VIII SUV 5.18). There are multifocal bone metastasis (C2 vertebral body SUV 9.0; 1.6 cm left sacral ala lesion SUV of 8.6).   He had marked leukocytosis with hypereosinophilia. Initial WBC was 72,300 with 53% eosinophils. Bone marrow on 03/29/2016 revealed marked increase in marrow eosinophils (approximate 30%). There was no diagnostic morphologic evidence of a myeloproliferative or lymphoid neoplasm. Flow cytometry revealed significant increase in eosinophils (54%). There was relative decreased myeloid cells with no significant immunophenotypic abnormalities or increase in blasts. There was no lymphoid abnormalities or evidence of clonality. FISH studies were negative for myeloproliferative neoplasms with eosinophilia (PDGFRA, PDGFRB, FGFR1). BCR-ABL was negative on 03/28/2016.  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  Left lower extremity duplex on 03/25/2016 revealed no evidence of DVT. Discomfort improved on Neurontin 300 mg a day and continues to improve with declining eosinophilia.  He received 41.4 Gy from 04/11/2016 - 05/23/2016.  He received 4 weeks of concurrent carboplatin and Taxol (04/17/2016 - 05/22/2016).    He has received 15 cycles of Keytruda (07/03/2016 - 10/12/2016; 11/22/2016 - 06/05/2017).  He has tolerated treatment well.  He receives [place] every 6 weeks (began 05/22/2016; last 06/26/2017).  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  He began Xarelto on 04/13/2017.  He has increased liver function tests.  Etiology is possibly secondary to acute hepatitis, irritation due to thrombosis, or progressive disease (metastatic liver lesion or [place]).  He is hepatitis B core antibody  and hepatitis C antibody positive.  Hepatitis C RNA was 5.474 log10 IU/ml  (298,000 IU/ml) on 07/03/2017.  Hepatitis C genotype is 1a.  AFP was 589.7 on 07/03/2017.  Chest, abdomen, and pelvic CT on 07/02/2017 revealed interval increase in the left hepatic vein thrombosis with progressive heterogeneity and enhancement of the left hepatic lobe, predominantly obscuring the known left hepatic lobe lesion. There was a possible new 1.5 cm lateral left hepatic lobe lesion versus focal area of heterogeneity. There was slight interval decrease in the size of the irregular low-attenuation lesion within segment 8 of the liver. Right lung was stable in appearance with interval development of consolidation within the lingula representing atelectasis or infection.  Abdominal MRI on 07/05/2017 revealed persistent mass within lateral segment of left lobe of liver with progressive thrombosis of the left portal vein. There was also persistent thrombosis of the left hepatic vein. Portal vein thrombus extended up to the level of the main portal vein and was likely tumor thrombus. The liver has morphologic features suggestive of early cirrhosis. All findings are highly suggestive that the lesion in the left lobe of liver represents hepatoma rather than lung cancer metastasis. The lesion within segment 8 of the liver had decreased in size when compared with 04/13/2017.  The small lesion within segment 7 of the liver appeared new from previous exam. Indeterminate. This may represent an area of lung cancer metastasis or possibly multifocal HCC  Symptomatically, he feels good.  Imaging reveals left hepatic vein thrombosis and left portal vein thrombosis.  Code status is DNR/DNI.  Plan: 1.  Discuss patient's thoughts about therapy.  We discussed imaging studies worrisome for hepatocellular carcinoma. We discussed confirmation with an ultrasound-guided biopsy. If biopsy confirms adenocarcinoma, we discussed options including sorafenib or Yttrium-90.  He has a consultation with [facility]  Imaging tomorrow  to discuss Yttrium-90.  He is not a surgical candidate.  At this time, his metastatic lung cancer is fairly well controlled. The area of most concern is his liver which does not appear to be lung cancer but a primary liver tumor.  Patient consented to liver biopsy.  We discussed the importance stopping his anticoagulation prior to the procedure and only reinstituting once cleared by interventional radiology. 2.  We discussed switching from Xarelto to Lovenox. Lovenox has been approved. He has been taught subcutaneous technique. He is to take Lovenox every 12 hours. Lovenox will start at the time that he would take his next dose of Xarelto.  Xarelto will thus be discontinued. 3.  Rx:  Lovenox 60 mg SQ q 12 hours. 4.  Discontinue Xarelto. 5.  Schedule ultrasound guided liver biopsy. 6.  Ensure patient off Lovenox per protocol for biopsy. 7.  RTC 3 days after liver biopsy.   Lequita Asal, MD  07/10/2017, 11:19 AM

## 2017-07-10 NOTE — Progress Notes (Signed)
Patient accompanied by his daughter today.

## 2017-07-11 ENCOUNTER — Ambulatory Visit
Admission: RE | Admit: 2017-07-11 | Discharge: 2017-07-11 | Disposition: A | Discharge status: Home / Self Care | Payer: Medicare Other | Source: Ambulatory Visit | Attending: Hematology and Oncology | Admitting: Hematology and Oncology

## 2017-07-11 ENCOUNTER — Encounter: Payer: Self-pay | Admitting: *Deleted

## 2017-07-11 DIAGNOSIS — C3491 Malignant neoplasm of unspecified part of right bronchus or lung: Secondary | ICD-10-CM | POA: Diagnosis not present

## 2017-07-11 DIAGNOSIS — R160 Hepatomegaly, not elsewhere classified: Secondary | ICD-10-CM

## 2017-07-11 HISTORY — PX: IR RADIOLOGIST EVAL & MGMT: IMG5224

## 2017-07-11 NOTE — Consult Note (Signed)
Chief Complaint: Patient was seen in consultation today for radioembolization of the liver at the request of Kinta C  Referring Physician(s): Corcoran,Melissa C  History of Present Illness: Gerald [name] is a 66 y.o. male with a history of stage IV adenocarcinoma of the right lung metastatic to mediastinal lymph nodes, bone and liver. He was treated with chemotherapy and radiation therapy with overall excellent response to treatment. More recently in May, he was found to have thrombus in a left hepatic vein with suggestion of a lateral left hepatic lobe lesion. MRI on 07/05/2017 confirms the presence of an enlarging mass in the left lobe of the liver spanning over a distance of up to 7 cm in length. There now is evidence of thrombus in the left portal vein suspicious for tumor thrombus.  Additional workup demonstrated an elevated alpha-fetoprotein of 590 and evidence of hepatitis C infection with quantitative viral load of 298,000. His anticoagulation regimen is being changed from Xarelto to Lovenox. A biopsy of the liver has been requested for tissue confirmation of probable hepatocellular carcinoma. He has been referred to discuss liver directed therapy and in particular Y-90 radioembolization should biopsy be consistent with hepatocellular carcinoma.  Past Medical History:  Diagnosis Date  . Arthritis    Rheumatoid arthritis  . Cancer ([place])    lung ca   . Collagen vascular disease ([facility])    RA  . Mass of lung   . Pneumothorax    spontaneous  . Renal disorder    as a child    Past Surgical History:  Procedure Laterality Date  . FLEXIBLE BRONCHOSCOPY N/A 03/30/2016   Procedure: FLEXIBLE BRONCHOSCOPY;  Surgeon: Vilinda Boehringer, MD;  Location: [facility] ORS;  Service: Cardiopulmonary;  Laterality: N/A;  . Ureter repair as a child      Allergies: Nsaids and Sulfa antibiotics  Medications: Prior to Admission medications   Medication Sig Start Date End Date Taking?  Authorizing Provider  calcium-vitamin D (OSCAL WITH D) 500-200 MG-UNIT tablet Take 1 tablet by mouth 2 (two) times daily. 09/20/16 06/05/17  Creola Corn, MD  enoxaparin (LOVENOX) 60 MG/0.6ML injection Inject 0.6 mLs (60 mg total) into the skin every 12 (twelve) hours. 07/09/17   Lequita Asal, MD  folic acid (FOLVITE) 1 MG tablet Take 1 tablet (1 mg total) by mouth daily. 11/16/16   Lequita Asal, MD  gabapentin (NEURONTIN) 300 MG capsule Take 1 capsule (300 mg total) by mouth at bedtime. 11/07/16   Lequita Asal, MD  HYDROcodone-acetaminophen (NORCO) 7.5-325 MG tablet Take 1 tablet by mouth every 4 (four) hours as needed for moderate pain (Every 4-6 hours as needed for pain). 07/03/16   Lequita Asal, MD  ondansetron (ZOFRAN) 8 MG tablet Take 1 tablet (8 mg total) by mouth 2 (two) times daily as needed for nausea or vomiting. 04/17/16   Lequita Asal, MD  oxyCODONE (OXY IR/ROXICODONE) 5 MG immediate release tablet Take 1 tablet (5 mg total) by mouth every 4 (four) hours as needed for severe pain. 06/26/17   Lequita Asal, MD  oxyCODONE-acetaminophen (PERCOCET/ROXICET) 5-325 MG tablet Take 1 tablet by mouth every 6 (six) hours as needed for severe pain. 03/27/17   Lequita Asal, MD  rivaroxaban (XARELTO) 20 MG TABS tablet Take 1 tablet (20 mg total) by mouth daily with supper. 05/11/17   Lequita Asal, MD  sucralfate (CARAFATE) 1 g tablet Take 1 tablet (1 g total) by mouth 3 (three) times daily. Dissolve  in 2-3 tbsp warm water, swish and swallow. 05/02/16   Noreene Filbert, MD     Family History  Problem Relation Age of Onset  . Brain cancer Mother   . Lung cancer Maternal Uncle   . Cancer Sister        Metastatic cancer- unknown primary    Social History   Social History  . Marital status: Legally Separated    Spouse name: N/A  . Number of children: N/A  . Years of education: N/A   Social History Main Topics  . Smoking status: Former Smoker      Packs/day: 0.50    Years: 26.00    Types: Cigarettes    Quit date: 06/08/2000  . Smokeless tobacco: Never Used     Comment: Quit about 5 years ago  . Alcohol use 0.0 oz/week     Comment: occasionally beer  . Drug use: No  . Sexual activity: Not on file   Other Topics Concern  . Not on file   Social History Narrative   Lives at home with son.    ECOG Status: 1 - Symptomatic but completely ambulatory (minimally symptomatic)  Review of Systems: A 12 point ROS discussed and pertinent positives are indicated in the HPI above.  All other systems are negative.  Review of Systems  Constitutional: Positive for fatigue. Negative for chills, diaphoresis and fever.  HENT: Negative.   Respiratory: Negative.   Cardiovascular: Negative.   Gastrointestinal: Negative for abdominal distention, anal bleeding, blood in stool, constipation, diarrhea, nausea and vomiting.       Mild right upper quadrant pain occasionally.  Genitourinary: Negative.   Musculoskeletal: Negative.   Neurological: Negative.     Vital Signs: BP 94/67   Pulse 99   Temp 98.1 F (36.7 C) (Oral)   Resp 14   Ht 5' 11"  (1.803 m)   Wt 131 lb (59.4 kg)   SpO2 97%   BMI 18.27 kg/m   Physical Exam  Constitutional: He is oriented to person, place, and time. No distress.  Thin, somewhat cachectic appearing but in no distress.  HENT:  Head: Normocephalic and atraumatic.  Neck: Neck supple. No JVD present.  Cardiovascular: Normal rate and regular rhythm.  Exam reveals no gallop and no friction rub.   No murmur heard. Pulmonary/Chest: Effort normal and breath sounds normal. No stridor. No respiratory distress. He has no wheezes. He has no rales.  Abdominal: Soft. Bowel sounds are normal. He exhibits no distension. There is no tenderness. There is no rebound and no guarding.  Musculoskeletal: He exhibits no edema.  Lymphadenopathy:    He has no cervical adenopathy.  Neurological: He is alert and oriented to person,  place, and time.  Skin: He is not diaphoretic.  Nursing note and vitals reviewed.    Imaging: Ct Chest W Contrast  Result Date: 07/02/2017 CLINICAL DATA:  Patient with history of metastatic right upper lobe carcinoma. History of chemotherapy and radiation. Restaging exam. EXAM: CT CHEST, ABDOMEN, AND PELVIS WITH CONTRAST TECHNIQUE: Multidetector CT imaging of the chest, abdomen and pelvis was performed following the standard protocol during bolus administration of intravenous contrast. CONTRAST:  51m ISOVUE-300 IOPAMIDOL (ISOVUE-300) INJECTION 61% COMPARISON:  CT CAP 04/13/2017 FINDINGS: CT CHEST FINDINGS Cardiovascular: Rightward shift of the mediastinum. Normal heart size. No pericardial effusion. Aorta and main pulmonary artery are normal in caliber. Mediastinum/Nodes: No enlarged axillary, mediastinal or hilar lymphadenopathy. Normal esophagus. Lungs/Pleura: Central airways are patent. Stable appearing right paramediastinal and right perihilar opacities  most compatible with radiation changes. Unchanged scarring within the left lung apex. Similar-appearing 1.6 cm right perihilar mass (image 24; series 2). Unchanged trace loculated right pleural effusion (image 26; series 2). Interval development of consolidative opacities within the lingula (image 5-7; series 5). No pneumothorax. Musculoskeletal: No aggressive or acute appearing osseous lesions. CT ABDOMEN PELVIS FINDINGS Hepatobiliary: Progression of hepatic vein thrombosis within the left hepatic lobe with increased heterogeneity of the left hepatic lobe enhancement, limiting evaluation of known left hepatic lobe lesion. Overall there is increased heterogeneity throughout the left hepatic lobe when compared to recent prior. Additionally there is suggestion of a possible new lesion within the left hepatic lobe measuring 1.5 cm (image 55; series 2). Slight interval decrease in size of triangular-shaped low-attenuation lesion within segment 8 measuring  1.7 x 1.2 cm (image 52; series 2), previously 1.5 x 2.1 cm. Re- demonstrated transient hepatic attenuation within segment 6 (image 60; series 2). Similar-appearing lesion within segment 3 of the liver measuring 1.6 x 1.2 cm (image 69; series 2), previously 1.6 x 1.3 cm. Pancreas: Unremarkable Spleen: Lobular in contour, unchanged. Adrenals/Urinary Tract: Normal adrenal glands. Unchanged scarring medial left kidney, interpolar region. No hydronephrosis. Urinary bladder is unremarkable. Stomach/Bowel: No abnormal bowel wall thickening or evidence for bowel obstruction. No free fluid or free intraperitoneal air. Normal morphology of the stomach. Vascular/Lymphatic: Peripheral calcified atherosclerotic plaque involving the abdominal aorta. Stable subcentimeter retroperitoneal lymph nodes. This includes an unchanged 8 mm gastrohepatic lymph node (image 58; series 2). Reproductive: Unremarkable. Other: None. Musculoskeletal: No aggressive or acute appearing osseous lesions. IMPRESSION: Interval increase in left hepatic vein thrombosis with progressive heterogeneity in enhancement of the left hepatic lobe, predominately obscuring the known left hepatic lobe lesion. Possible new 1.5 cm lateral left hepatic lobe lesion versus focal area of heterogeneity. Slight interval decrease in size of irregular low-attenuation lesion within segment 8 of the liver. Stable appearance of the right lung. Interval development of consolidation within the lingula which may represent atelectasis or infection. Recommend attention on follow-up as underlying lesion is not entirely excluded. Aortic atherosclerosis. Electronically Signed   By: Lovey Newcomer M.D.   On: 07/02/2017 14:42   Mr Abdomen W Wo Contrast  Result Date: 07/05/2017 CLINICAL DATA:  Stage IV lung cancer with possible liver metastases EXAM: MRI ABDOMEN WITHOUT AND WITH CONTRAST TECHNIQUE: Multiplanar multisequence MR imaging of the abdomen was performed both before and after the  administration of intravenous contrast. CONTRAST:  38m MULTIHANCE GADOBENATE DIMEGLUMINE 529 MG/ML IV SOLN COMPARISON:  07/02/2017 FINDINGS: Lower chest: Small left pleural effusion identified. Nodules within the lingula are again noted. Better characterized on recent chest CT. Hepatobiliary: Hypertrophy of the lateral segment of left lobe of liver and caudate lobe of liver identified. Previous segment 8 liver lesion demonstrates interval response to therapy. Currently this measures 1.4 cm. Compared with 12/29/2016 when this measured 2.7 cm and 2.1 cm on 04/13/2017. The lesion within the lateral segment of left lobe of liver measures 3.4 x 4.1 cm. This is compared with 3.6 x 4.9 cm on 12/29/2016. However, there is progressive thrombosis involving the left portal vein when compared with 04/13/2017. Additionally, there is continued thrombosis of the left hepatic vein and its branches. There is a new lesion within segment 7 measuring 1.2 cm, image 24 of series 17. Pancreas:  The gallbladder is unremarkable.  No biliary dilatation. Spleen: Chronic posttraumatic appearance of the spleen without suspicious splenic lesion. Adrenals/Urinary Tract: The adrenal glands appear normal. Unremarkable appearance of the  kidneys. Stomach/Bowel: Visualized portions within the abdomen are unremarkable. Vascular/Lymphatic: Aortic atherosclerosis. No aneurysm. Small varicosities are noted within the left upper quadrant of the abdomen. Prominent gastrohepatic ligament lymph nodes are identified which measure up to 1 cm. Other:  No free fluid or fluid collections identified. Musculoskeletal: No suspicious bone lesions identified. IMPRESSION: 1. Persistent mass within lateral segment of left lobe of liver is identified with progressive thrombosis of the left portal vein. There is also persistent thrombosis of the left hepatic vein. Portal vein thrombus extends up to the level of the main portal vein and is likely tumor thrombus. The liver  has morphologic features suggestive of early cirrhosis. Additionally, the patient is positive for hepatitis-C and hepatitis-B and has an elevated AFP. All findings are highly suggestive that the lesion in the left lobe of liver represents hepatoma rather than lung cancer metastasis. 2. Lesion within segment 8 of the liver has decreased in size when compared with 04/13/2017. 3. Small lesion within segment 7 of the liver appears new from previous exam. Indeterminate. This may represent an area of lung cancer metastasis or possibly multifocal HCC. These results were called by telephone at the time of interpretation on 07/05/2017 at 12:39 pm to Dr. Nolon Stalls , who verbally acknowledged these results. Electronically Signed   By: Kerby Moors M.D.   On: 07/05/2017 12:41   Ct Abdomen Pelvis W Contrast  Result Date: 07/02/2017 CLINICAL DATA:  Patient with history of metastatic right upper lobe carcinoma. History of chemotherapy and radiation. Restaging exam. EXAM: CT CHEST, ABDOMEN, AND PELVIS WITH CONTRAST TECHNIQUE: Multidetector CT imaging of the chest, abdomen and pelvis was performed following the standard protocol during bolus administration of intravenous contrast. CONTRAST:  64m ISOVUE-300 IOPAMIDOL (ISOVUE-300) INJECTION 61% COMPARISON:  CT CAP 04/13/2017 FINDINGS: CT CHEST FINDINGS Cardiovascular: Rightward shift of the mediastinum. Normal heart size. No pericardial effusion. Aorta and main pulmonary artery are normal in caliber. Mediastinum/Nodes: No enlarged axillary, mediastinal or hilar lymphadenopathy. Normal esophagus. Lungs/Pleura: Central airways are patent. Stable appearing right paramediastinal and right perihilar opacities most compatible with radiation changes. Unchanged scarring within the left lung apex. Similar-appearing 1.6 cm right perihilar mass (image 24; series 2). Unchanged trace loculated right pleural effusion (image 26; series 2). Interval development of consolidative opacities  within the lingula (image 5-7; series 5). No pneumothorax. Musculoskeletal: No aggressive or acute appearing osseous lesions. CT ABDOMEN PELVIS FINDINGS Hepatobiliary: Progression of hepatic vein thrombosis within the left hepatic lobe with increased heterogeneity of the left hepatic lobe enhancement, limiting evaluation of known left hepatic lobe lesion. Overall there is increased heterogeneity throughout the left hepatic lobe when compared to recent prior. Additionally there is suggestion of a possible new lesion within the left hepatic lobe measuring 1.5 cm (image 55; series 2). Slight interval decrease in size of triangular-shaped low-attenuation lesion within segment 8 measuring 1.7 x 1.2 cm (image 52; series 2), previously 1.5 x 2.1 cm. Re- demonstrated transient hepatic attenuation within segment 6 (image 60; series 2). Similar-appearing lesion within segment 3 of the liver measuring 1.6 x 1.2 cm (image 69; series 2), previously 1.6 x 1.3 cm. Pancreas: Unremarkable Spleen: Lobular in contour, unchanged. Adrenals/Urinary Tract: Normal adrenal glands. Unchanged scarring medial left kidney, interpolar region. No hydronephrosis. Urinary bladder is unremarkable. Stomach/Bowel: No abnormal bowel wall thickening or evidence for bowel obstruction. No free fluid or free intraperitoneal air. Normal morphology of the stomach. Vascular/Lymphatic: Peripheral calcified atherosclerotic plaque involving the abdominal aorta. Stable subcentimeter retroperitoneal lymph nodes. This  includes an unchanged 8 mm gastrohepatic lymph node (image 58; series 2). Reproductive: Unremarkable. Other: None. Musculoskeletal: No aggressive or acute appearing osseous lesions. IMPRESSION: Interval increase in left hepatic vein thrombosis with progressive heterogeneity in enhancement of the left hepatic lobe, predominately obscuring the known left hepatic lobe lesion. Possible new 1.5 cm lateral left hepatic lobe lesion versus focal area of  heterogeneity. Slight interval decrease in size of irregular low-attenuation lesion within segment 8 of the liver. Stable appearance of the right lung. Interval development of consolidation within the lingula which may represent atelectasis or infection. Recommend attention on follow-up as underlying lesion is not entirely excluded. Aortic atherosclerosis. Electronically Signed   By: Lovey Newcomer M.D.   On: 07/02/2017 14:42    Labs:  CBC:  Recent Labs  05/15/17 0846 06/05/17 0845 06/26/17 0900 07/03/17 0830  WBC 7.3 9.4 9.5 7.4  HGB 13.3 13.7 13.2 13.5  HCT 38.4* 39.0* 37.8* 39.3*  PLT 179 329 242 216    COAGS:  Recent Labs  04/13/17 1350  INR 1.10  APTT 32    BMP:  Recent Labs  05/15/17 0846 06/05/17 0845 06/26/17 0900 07/03/17 0830  NA 137 136 136 137  K 4.0 4.5 4.2 4.1  CL 105 102 104 104  CO2 28 27 28 28   GLUCOSE 96 110* 122* 133*  BUN 20 15 20 18   CALCIUM 8.9 9.1 9.3 9.2  CREATININE 0.85 0.95 0.78 0.80  GFRNONAA >60 >60 >60 >60  GFRAA >60 >60 >60 >60    LIVER FUNCTION TESTS:  Recent Labs  05/15/17 0846 06/05/17 0845 06/26/17 0900 07/03/17 0830  BILITOT 0.8 0.9 0.6 0.7  AST 69* 87* 143* 108*  ALT 60 65* 116* 93*  ALKPHOS 76 76 94 82  PROT 8.2* 8.6* 8.4* 8.1  ALBUMIN 3.7 3.6 3.4* 3.4*    TUMOR MARKERS:  Recent Labs  11/22/16 1300 02/13/17 1322 03/06/17 0920  CEA 2.8 2.9 3.0    Assessment and Plan:  I met with Mr. Lacorte and reviewed the recent MRI of the abdomen with him. The left lobe liver lesion is infiltrative in appearance and shows irregular enhancement. It is associated with thrombus in hepatic vein and portal vein branches of the left lobe as well as some mild biliary dilatation. Given elevated AFP and evidence of active hepatitis C, this most likely represents hepatocellular carcinoma rather than metastatic lung carcinoma. Previous metastatic lesion in the right lobe has shrunken considerably and now appears cystic with no  significant enhancement. There is a small area of enhancement superiorly within segment 7 that measures up to 1.2 cm.  Mr. Behrend functional status is still very good and he lives at home with one of his sons. He does have mild elevation of transaminases but a normal bilirubin and alkaline phosphatase level. His main and right portal veins appear widely patent.  He has no ascites or encephalopathy. I discussed the need for biopsy for tissue confirmation of hepatocellular carcinoma. An ultrasound-guided biopsy of the liver mass has been scheduled for August 8.  Potential Yttrium-90 radioembolization of the liver was discussed in detail today. This includes an initial planning arteriogram at which time embolization of the gastroduodenal artery and potentially other gastric supply is performed. In reviewing his prior imaging, it appears that he has a typical left hepatic artery but also an accessory left hepatic artery off of the left gastric artery supplying tumor. Pulmonary shunt calculation is also performed at the time of the initial arteriogram to  rule out significant pulmonary shunting prior to treatment. The radioembolization is then performed approximately 10-14 days after the first procedure. Both procedures are on an outpatient basis and do not require hospitalization. I told Mr. Lofgren that the main side effect of radioembolization is fatigue which usually begins a couple days after treatment.  After discussion, Mr. Shock would like to proceed with liver biopsy next week to confirm a tissue diagnosis. He would also like to proceed with radioembolization should biopsy confirm hepatocellular carcinoma. We will begin the authorization process for hepatic radioembolization.  Thank you for this interesting consult.  I greatly enjoyed meeting Kazimir Hartnett Bolender and look forward to participating in their care.  A copy of this report was sent to the requesting provider on this date.  Electronically  SignedAletta Edouard T 07/11/2017, 4:10 PM     I spent a total of 60 Minutes in face to face in clinical consultation, greater than 50% of which was counseling/coordinating care for a left lobe liver mass.

## 2017-07-12 ENCOUNTER — Telehealth: Payer: Self-pay | Admitting: *Deleted

## 2017-07-12 ENCOUNTER — Other Ambulatory Visit (HOSPITAL_COMMUNITY): Payer: Self-pay | Admitting: Interventional Radiology

## 2017-07-12 DIAGNOSIS — C787 Secondary malignant neoplasm of liver and intrahepatic bile duct: Secondary | ICD-10-CM

## 2017-07-12 DIAGNOSIS — C3411 Malignant neoplasm of upper lobe, right bronchus or lung: Secondary | ICD-10-CM

## 2017-07-12 DIAGNOSIS — R772 Abnormality of alphafetoprotein: Secondary | ICD-10-CM

## 2017-07-12 NOTE — Telephone Encounter (Signed)
Duplicate-opened in error. 

## 2017-07-12 NOTE — Telephone Encounter (Signed)
Spoke with patient's pharmacy and patient. His Lovenox prescription is ready at the [facility] on [address]. Patient verbalized understanding.

## 2017-07-16 ENCOUNTER — Telehealth: Payer: Self-pay | Admitting: *Deleted

## 2017-07-16 NOTE — Telephone Encounter (Signed)
Called to discuss holding lovenox on August 7th for both doses and the morning dose of August 8th.  Message left for patient and for him to call back with questions.  Discussed this in person last week, but wanted to reinforce.

## 2017-07-17 ENCOUNTER — Other Ambulatory Visit: Payer: Self-pay | Admitting: Radiology

## 2017-07-17 ENCOUNTER — Other Ambulatory Visit: Payer: No Typology Code available for payment source

## 2017-07-18 ENCOUNTER — Ambulatory Visit
Admission: RE | Admit: 2017-07-18 | Discharge: 2017-07-18 | Disposition: A | Discharge status: Home / Self Care | Payer: Medicare Other | Source: Ambulatory Visit | Attending: Hematology and Oncology | Admitting: Hematology and Oncology

## 2017-07-18 DIAGNOSIS — K7689 Other specified diseases of liver: Secondary | ICD-10-CM | POA: Diagnosis not present

## 2017-07-18 DIAGNOSIS — R772 Abnormality of alphafetoprotein: Secondary | ICD-10-CM | POA: Diagnosis not present

## 2017-07-18 DIAGNOSIS — M069 Rheumatoid arthritis, unspecified: Secondary | ICD-10-CM | POA: Diagnosis not present

## 2017-07-18 DIAGNOSIS — C229 Malignant neoplasm of liver, not specified as primary or secondary: Secondary | ICD-10-CM | POA: Diagnosis not present

## 2017-07-18 DIAGNOSIS — B1920 Unspecified viral hepatitis C without hepatic coma: Secondary | ICD-10-CM | POA: Insufficient documentation

## 2017-07-18 DIAGNOSIS — Z79899 Other long term (current) drug therapy: Secondary | ICD-10-CM | POA: Insufficient documentation

## 2017-07-18 DIAGNOSIS — Z7901 Long term (current) use of anticoagulants: Secondary | ICD-10-CM | POA: Insufficient documentation

## 2017-07-18 DIAGNOSIS — I81 Portal vein thrombosis: Secondary | ICD-10-CM | POA: Diagnosis not present

## 2017-07-18 DIAGNOSIS — C3411 Malignant neoplasm of upper lobe, right bronchus or lung: Secondary | ICD-10-CM | POA: Diagnosis not present

## 2017-07-18 DIAGNOSIS — Z87891 Personal history of nicotine dependence: Secondary | ICD-10-CM | POA: Insufficient documentation

## 2017-07-18 DIAGNOSIS — C220 Liver cell carcinoma: Secondary | ICD-10-CM | POA: Diagnosis not present

## 2017-07-18 DIAGNOSIS — C787 Secondary malignant neoplasm of liver and intrahepatic bile duct: Secondary | ICD-10-CM | POA: Insufficient documentation

## 2017-07-18 HISTORY — DX: Dyspnea, unspecified: R06.00

## 2017-07-18 HISTORY — DX: Inflammatory liver disease, unspecified: K75.9

## 2017-07-18 HISTORY — DX: Pneumonia, unspecified organism: J18.9

## 2017-07-18 LAB — CBC
HCT: 40.1 % (ref 40.0–52.0)
HEMOGLOBIN: 13.4 g/dL (ref 13.0–18.0)
MCH: 32.3 pg (ref 26.0–34.0)
MCHC: 33.3 g/dL (ref 32.0–36.0)
MCV: 97 fL (ref 80.0–100.0)
PLATELETS: 165 10*3/uL (ref 150–440)
RBC: 4.14 MIL/uL — ABNORMAL LOW (ref 4.40–5.90)
RDW: 14.1 % (ref 11.5–14.5)
WBC: 7.8 10*3/uL (ref 3.8–10.6)

## 2017-07-18 LAB — PROTIME-INR
INR: 1.19
PROTHROMBIN TIME: 15.2 s (ref 11.4–15.2)

## 2017-07-18 LAB — APTT: APTT: 36 s (ref 24–36)

## 2017-07-18 MED ORDER — FENTANYL CITRATE (PF) 100 MCG/2ML IJ SOLN
INTRAMUSCULAR | Status: AC
  Filled 2017-07-18: qty 2

## 2017-07-18 MED ORDER — FENTANYL CITRATE (PF) 100 MCG/2ML IJ SOLN
INTRAMUSCULAR | Status: AC | PRN
  Administered 2017-07-18: 50 ug via INTRAVENOUS

## 2017-07-18 MED ORDER — MIDAZOLAM HCL 5 MG/5ML IJ SOLN
INTRAMUSCULAR | Status: AC
  Filled 2017-07-18: qty 5

## 2017-07-18 MED ORDER — SODIUM CHLORIDE 0.9 % IV SOLN
INTRAVENOUS | Status: DC
  Administered 2017-07-18: 11:00:00 via INTRAVENOUS

## 2017-07-18 MED ORDER — MIDAZOLAM HCL 5 MG/5ML IJ SOLN
INTRAMUSCULAR | Status: AC | PRN
  Administered 2017-07-18 (×2): 1 mg via INTRAVENOUS

## 2017-07-18 NOTE — Consult Note (Signed)
Chief Complaint: Indeterminate liver lesion  Referring Physician(s): Corcoran,Melissa C (Oncology) Kathlene Cote (IR)  Patient Status: [facility] - Out-pt  History of Present Illness: Gerald [name] is a 66 y.o. male with past medical history significant for rheumatoid arthritis, collagen vascular disease and recent diagnosis of hepatitis B and C was found to have a invasive lesion involving the left lobe of the liver with associated portal and hepatic vein thrombosis. This finding is also associated with an elevated AFP.   As there is concern that this liver mass represents a separate hepatocellular carcinoma, ultrasound-guided liver lesion biopsy has been requested to exclude metastatic lung cancer.    Patient remains in his baseline state of fair health.   He reports fatigue and an approximately 40 pound weight loss during this past year.   Patient denies chest pain or shortness of breath. He denies fever or chills.   No abdominal pain or increased abdominal girth. No yellowing of the skin in our eyes. No change in mental status.   No change in bowel and bladder functions.  Note, patient has recently been evaluated by my partner, Dr. Kathlene Cote, for potential Y 90 radioembolization.  Past Medical History:  Diagnosis Date  . Arthritis    Rheumatoid arthritis  . Cancer ([place])    lung ca   . Collagen vascular disease ([facility])    RA  . Dyspnea   . Hepatitis    b and c  . Mass of lung   . Pneumonia   . Pneumothorax    spontaneous  . Renal disorder    as a child    Past Surgical History:  Procedure Laterality Date  . FLEXIBLE BRONCHOSCOPY N/A 03/30/2016   Procedure: FLEXIBLE BRONCHOSCOPY;  Surgeon: Vilinda Boehringer, MD;  Location: [facility] ORS;  Service: Cardiopulmonary;  Laterality: N/A;  . Ureter repair as a child      Allergies: Nsaids and Sulfa antibiotics  Medications: Prior to Admission medications   Medication Sig Start Date End Date Taking? Authorizing Provider  folic acid  (FOLVITE) 1 MG tablet Take 1 tablet (1 mg total) by mouth daily. 11/16/16  Yes Corcoran, Drue Second, MD  gabapentin (NEURONTIN) 300 MG capsule Take 1 capsule (300 mg total) by mouth at bedtime. 11/07/16  Yes Corcoran, Drue Second, MD  calcium-vitamin D (OSCAL WITH D) 500-200 MG-UNIT tablet Take 1 tablet by mouth 2 (two) times daily. 09/20/16 06/05/17  Creola Corn, MD  enoxaparin (LOVENOX) 60 MG/0.6ML injection Inject 0.6 mLs (60 mg total) into the skin every 12 (twelve) hours. Patient not taking: Reported on 07/18/2017 07/09/17   Lequita Asal, MD  HYDROcodone-acetaminophen (NORCO) 7.5-325 MG tablet Take 1 tablet by mouth every 4 (four) hours as needed for moderate pain (Every 4-6 hours as needed for pain). Patient not taking: Reported on 07/18/2017 07/03/16   Lequita Asal, MD  ondansetron (ZOFRAN) 8 MG tablet Take 1 tablet (8 mg total) by mouth 2 (two) times daily as needed for nausea or vomiting. 04/17/16   Lequita Asal, MD  oxyCODONE (OXY IR/ROXICODONE) 5 MG immediate release tablet Take 1 tablet (5 mg total) by mouth every 4 (four) hours as needed for severe pain. 06/26/17   Lequita Asal, MD  oxyCODONE-acetaminophen (PERCOCET/ROXICET) 5-325 MG tablet Take 1 tablet by mouth every 6 (six) hours as needed for severe pain. Patient not taking: Reported on 07/18/2017 03/27/17   Lequita Asal, MD  rivaroxaban (XARELTO) 20 MG TABS tablet Take 1 tablet (20 mg total) by  mouth daily with supper. 05/11/17   Lequita Asal, MD  sucralfate (CARAFATE) 1 g tablet Take 1 tablet (1 g total) by mouth 3 (three) times daily. Dissolve in 2-3 tbsp warm water, swish and swallow. Patient not taking: Reported on 07/18/2017 05/02/16   Noreene Filbert, MD     Family History  Problem Relation Age of Onset  . Brain cancer Mother   . Lung cancer Maternal Uncle   . Cancer Sister        Metastatic cancer- unknown primary    Social History   Social History  . Marital status: Legally Separated     Spouse name: N/A  . Number of children: N/A  . Years of education: N/A   Social History Main Topics  . Smoking status: Former Smoker    Packs/day: 0.50    Years: 26.00    Types: Cigarettes    Quit date: 06/08/2000  . Smokeless tobacco: Never Used     Comment: Quit about 5 years ago  . Alcohol use 0.0 oz/week     Comment: occasionally beer  . Drug use: No  . Sexual activity: Not Asked   Other Topics Concern  . None   Social History Narrative   Lives at home with son.    ECOG Status: 1 - Symptomatic but completely ambulatory  Review of Systems: A 12 point ROS discussed and pertinent positives are indicated in the HPI above.  All other systems are negative.  Review of Systems  Constitutional: Positive for activity change, fatigue and unexpected weight change. Negative for fever.  Respiratory: Negative.   Cardiovascular: Negative.   Gastrointestinal: Negative.   Neurological: Negative.     Vital Signs: BP 98/70   Pulse 82   Temp 97.8 F (36.6 C) (Oral)   Resp 16   SpO2 97%   Physical Exam  Constitutional:  Cachectic-appearing male who appears older than his stated age.  Eyes: Conjunctivae are normal.  Cardiovascular: Normal rate and regular rhythm.   Pulmonary/Chest: Effort normal and breath sounds normal.  Abdominal: Soft. Bowel sounds are normal. He exhibits no mass. There is no tenderness.  Skin: Skin is warm and dry.  Nursing note and vitals reviewed.   Imaging: Ct Chest W Contrast  Result Date: 07/02/2017 CLINICAL DATA:  Patient with history of metastatic right upper lobe carcinoma. History of chemotherapy and radiation. Restaging exam. EXAM: CT CHEST, ABDOMEN, AND PELVIS WITH CONTRAST TECHNIQUE: Multidetector CT imaging of the chest, abdomen and pelvis was performed following the standard protocol during bolus administration of intravenous contrast. CONTRAST:  74mL ISOVUE-300 IOPAMIDOL (ISOVUE-300) INJECTION 61% COMPARISON:  CT CAP 04/13/2017 FINDINGS: CT  CHEST FINDINGS Cardiovascular: Rightward shift of the mediastinum. Normal heart size. No pericardial effusion. Aorta and main pulmonary artery are normal in caliber. Mediastinum/Nodes: No enlarged axillary, mediastinal or hilar lymphadenopathy. Normal esophagus. Lungs/Pleura: Central airways are patent. Stable appearing right paramediastinal and right perihilar opacities most compatible with radiation changes. Unchanged scarring within the left lung apex. Similar-appearing 1.6 cm right perihilar mass (image 24; series 2). Unchanged trace loculated right pleural effusion (image 26; series 2). Interval development of consolidative opacities within the lingula (image 5-7; series 5). No pneumothorax. Musculoskeletal: No aggressive or acute appearing osseous lesions. CT ABDOMEN PELVIS FINDINGS Hepatobiliary: Progression of hepatic vein thrombosis within the left hepatic lobe with increased heterogeneity of the left hepatic lobe enhancement, limiting evaluation of known left hepatic lobe lesion. Overall there is increased heterogeneity throughout the left hepatic lobe when compared to recent  prior. Additionally there is suggestion of a possible new lesion within the left hepatic lobe measuring 1.5 cm (image 55; series 2). Slight interval decrease in size of triangular-shaped low-attenuation lesion within segment 8 measuring 1.7 x 1.2 cm (image 52; series 2), previously 1.5 x 2.1 cm. Re- demonstrated transient hepatic attenuation within segment 6 (image 60; series 2). Similar-appearing lesion within segment 3 of the liver measuring 1.6 x 1.2 cm (image 69; series 2), previously 1.6 x 1.3 cm. Pancreas: Unremarkable Spleen: Lobular in contour, unchanged. Adrenals/Urinary Tract: Normal adrenal glands. Unchanged scarring medial left kidney, interpolar region. No hydronephrosis. Urinary bladder is unremarkable. Stomach/Bowel: No abnormal bowel wall thickening or evidence for bowel obstruction. No free fluid or free  intraperitoneal air. Normal morphology of the stomach. Vascular/Lymphatic: Peripheral calcified atherosclerotic plaque involving the abdominal aorta. Stable subcentimeter retroperitoneal lymph nodes. This includes an unchanged 8 mm gastrohepatic lymph node (image 58; series 2). Reproductive: Unremarkable. Other: None. Musculoskeletal: No aggressive or acute appearing osseous lesions. IMPRESSION: Interval increase in left hepatic vein thrombosis with progressive heterogeneity in enhancement of the left hepatic lobe, predominately obscuring the known left hepatic lobe lesion. Possible new 1.5 cm lateral left hepatic lobe lesion versus focal area of heterogeneity. Slight interval decrease in size of irregular low-attenuation lesion within segment 8 of the liver. Stable appearance of the right lung. Interval development of consolidation within the lingula which may represent atelectasis or infection. Recommend attention on follow-up as underlying lesion is not entirely excluded. Aortic atherosclerosis. Electronically Signed   By: Lovey Newcomer M.D.   On: 07/02/2017 14:42   Mr Abdomen W Wo Contrast  Result Date: 07/05/2017 CLINICAL DATA:  Stage IV lung cancer with possible liver metastases EXAM: MRI ABDOMEN WITHOUT AND WITH CONTRAST TECHNIQUE: Multiplanar multisequence MR imaging of the abdomen was performed both before and after the administration of intravenous contrast. CONTRAST:  73mL MULTIHANCE GADOBENATE DIMEGLUMINE 529 MG/ML IV SOLN COMPARISON:  07/02/2017 FINDINGS: Lower chest: Small left pleural effusion identified. Nodules within the lingula are again noted. Better characterized on recent chest CT. Hepatobiliary: Hypertrophy of the lateral segment of left lobe of liver and caudate lobe of liver identified. Previous segment 8 liver lesion demonstrates interval response to therapy. Currently this measures 1.4 cm. Compared with 12/29/2016 when this measured 2.7 cm and 2.1 cm on 04/13/2017. The lesion within the  lateral segment of left lobe of liver measures 3.4 x 4.1 cm. This is compared with 3.6 x 4.9 cm on 12/29/2016. However, there is progressive thrombosis involving the left portal vein when compared with 04/13/2017. Additionally, there is continued thrombosis of the left hepatic vein and its branches. There is a new lesion within segment 7 measuring 1.2 cm, image 24 of series 17. Pancreas:  The gallbladder is unremarkable.  No biliary dilatation. Spleen: Chronic posttraumatic appearance of the spleen without suspicious splenic lesion. Adrenals/Urinary Tract: The adrenal glands appear normal. Unremarkable appearance of the kidneys. Stomach/Bowel: Visualized portions within the abdomen are unremarkable. Vascular/Lymphatic: Aortic atherosclerosis. No aneurysm. Small varicosities are noted within the left upper quadrant of the abdomen. Prominent gastrohepatic ligament lymph nodes are identified which measure up to 1 cm. Other:  No free fluid or fluid collections identified. Musculoskeletal: No suspicious bone lesions identified. IMPRESSION: 1. Persistent mass within lateral segment of left lobe of liver is identified with progressive thrombosis of the left portal vein. There is also persistent thrombosis of the left hepatic vein. Portal vein thrombus extends up to the level of the main portal vein and  is likely tumor thrombus. The liver has morphologic features suggestive of early cirrhosis. Additionally, the patient is positive for hepatitis-C and hepatitis-B and has an elevated AFP. All findings are highly suggestive that the lesion in the left lobe of liver represents hepatoma rather than lung cancer metastasis. 2. Lesion within segment 8 of the liver has decreased in size when compared with 04/13/2017. 3. Small lesion within segment 7 of the liver appears new from previous exam. Indeterminate. This may represent an area of lung cancer metastasis or possibly multifocal HCC. These results were called by telephone at the  time of interpretation on 07/05/2017 at 12:39 pm to Dr. Nolon Stalls , who verbally acknowledged these results. Electronically Signed   By: Kerby Moors M.D.   On: 07/05/2017 12:41   Ct Abdomen Pelvis W Contrast  Result Date: 07/02/2017 CLINICAL DATA:  Patient with history of metastatic right upper lobe carcinoma. History of chemotherapy and radiation. Restaging exam. EXAM: CT CHEST, ABDOMEN, AND PELVIS WITH CONTRAST TECHNIQUE: Multidetector CT imaging of the chest, abdomen and pelvis was performed following the standard protocol during bolus administration of intravenous contrast. CONTRAST:  15mL ISOVUE-300 IOPAMIDOL (ISOVUE-300) INJECTION 61% COMPARISON:  CT CAP 04/13/2017 FINDINGS: CT CHEST FINDINGS Cardiovascular: Rightward shift of the mediastinum. Normal heart size. No pericardial effusion. Aorta and main pulmonary artery are normal in caliber. Mediastinum/Nodes: No enlarged axillary, mediastinal or hilar lymphadenopathy. Normal esophagus. Lungs/Pleura: Central airways are patent. Stable appearing right paramediastinal and right perihilar opacities most compatible with radiation changes. Unchanged scarring within the left lung apex. Similar-appearing 1.6 cm right perihilar mass (image 24; series 2). Unchanged trace loculated right pleural effusion (image 26; series 2). Interval development of consolidative opacities within the lingula (image 5-7; series 5). No pneumothorax. Musculoskeletal: No aggressive or acute appearing osseous lesions. CT ABDOMEN PELVIS FINDINGS Hepatobiliary: Progression of hepatic vein thrombosis within the left hepatic lobe with increased heterogeneity of the left hepatic lobe enhancement, limiting evaluation of known left hepatic lobe lesion. Overall there is increased heterogeneity throughout the left hepatic lobe when compared to recent prior. Additionally there is suggestion of a possible new lesion within the left hepatic lobe measuring 1.5 cm (image 55; series 2). Slight  interval decrease in size of triangular-shaped low-attenuation lesion within segment 8 measuring 1.7 x 1.2 cm (image 52; series 2), previously 1.5 x 2.1 cm. Re- demonstrated transient hepatic attenuation within segment 6 (image 60; series 2). Similar-appearing lesion within segment 3 of the liver measuring 1.6 x 1.2 cm (image 69; series 2), previously 1.6 x 1.3 cm. Pancreas: Unremarkable Spleen: Lobular in contour, unchanged. Adrenals/Urinary Tract: Normal adrenal glands. Unchanged scarring medial left kidney, interpolar region. No hydronephrosis. Urinary bladder is unremarkable. Stomach/Bowel: No abnormal bowel wall thickening or evidence for bowel obstruction. No free fluid or free intraperitoneal air. Normal morphology of the stomach. Vascular/Lymphatic: Peripheral calcified atherosclerotic plaque involving the abdominal aorta. Stable subcentimeter retroperitoneal lymph nodes. This includes an unchanged 8 mm gastrohepatic lymph node (image 58; series 2). Reproductive: Unremarkable. Other: None. Musculoskeletal: No aggressive or acute appearing osseous lesions. IMPRESSION: Interval increase in left hepatic vein thrombosis with progressive heterogeneity in enhancement of the left hepatic lobe, predominately obscuring the known left hepatic lobe lesion. Possible new 1.5 cm lateral left hepatic lobe lesion versus focal area of heterogeneity. Slight interval decrease in size of irregular low-attenuation lesion within segment 8 of the liver. Stable appearance of the right lung. Interval development of consolidation within the lingula which may represent atelectasis or infection. Recommend attention  on follow-up as underlying lesion is not entirely excluded. Aortic atherosclerosis. Electronically Signed   By: Lovey Newcomer M.D.   On: 07/02/2017 14:42    Labs:  CBC:  Recent Labs  06/05/17 0845 06/26/17 0900 07/03/17 0830 07/18/17 1028  WBC 9.4 9.5 7.4 7.8  HGB 13.7 13.2 13.5 13.4  HCT 39.0* 37.8* 39.3* 40.1    PLT 329 242 216 165    COAGS:  Recent Labs  04/13/17 1350  INR 1.10  APTT 32    BMP:  Recent Labs  05/15/17 0846 06/05/17 0845 06/26/17 0900 07/03/17 0830  NA 137 136 136 137  K 4.0 4.5 4.2 4.1  CL 105 102 104 104  CO2 28 27 28 28   GLUCOSE 96 110* 122* 133*  BUN 20 15 20 18   CALCIUM 8.9 9.1 9.3 9.2  CREATININE 0.85 0.95 0.78 0.80  GFRNONAA >60 >60 >60 >60  GFRAA >60 >60 >60 >60    LIVER FUNCTION TESTS:  Recent Labs  05/15/17 0846 06/05/17 0845 06/26/17 0900 07/03/17 0830  BILITOT 0.8 0.9 0.6 0.7  AST 69* 87* 143* 108*  ALT 60 65* 116* 93*  ALKPHOS 76 76 94 82  PROT 8.2* 8.6* 8.4* 8.1  ALBUMIN 3.7 3.6 3.4* 3.4*    TUMOR MARKERS:  Recent Labs  11/22/16 1300 02/13/17 1322 03/06/17 0920  CEA 2.8 2.9 3.0    Assessment and Plan:  Oney Folz is a 66 y.o. male with past medical history significant for rheumatoid arthritis, collagen vascular disease and recent diagnosis of hepatitis B and C was found to have an invasive lesion involving the left lobe of the liver with associated portal and hepatic vein thrombosis. This finding is also associated with an elevated AFP.   As there is concern that this liver mass represents a separate hepatocellular carcinoma, ultrasound-guided liver lesion biopsy has been requested to exclude metastatic lung cancer.    The patient has held his Xarelto for the last 2 days.  Risks and benefits of ultrasound-guided liver lesion biopsy were discussed with the patient including, but not limited to bleeding, infection, damage to adjacent structures or low yield requiring additional tests.  All of the patient's questions were answered, patient is agreeable to proceed.  Consent signed and in chart.  Thank you for this interesting consult.  I greatly enjoyed meeting Gerald [name] and look forward to participating in their care.  A copy of this report was sent to the requesting provider on this date.  Electronically  Signed: Sandi Mariscal, MD 07/18/2017, 10:52 AM   I spent a total of 15 Minutes in face to face in clinical consultation, greater than 50% of which was counseling/coordinating care for Ultrasound-guided liver lesion biopsy

## 2017-07-18 NOTE — Procedures (Signed)
Pre Procedure Dx: Liver lesion Post Procedural Dx: Same  Technically successful US guided biopsy of indeterminate lesion within the left lobe of the liver.  EBL: None  No immediate complications.   Ronny Bacon, MD Pager #: [phone]

## 2017-07-20 ENCOUNTER — Other Ambulatory Visit (HOSPITAL_COMMUNITY): Payer: Self-pay | Admitting: Interventional Radiology

## 2017-07-20 DIAGNOSIS — C3411 Malignant neoplasm of upper lobe, right bronchus or lung: Secondary | ICD-10-CM

## 2017-07-20 DIAGNOSIS — C787 Secondary malignant neoplasm of liver and intrahepatic bile duct: Secondary | ICD-10-CM

## 2017-07-20 DIAGNOSIS — R772 Abnormality of alphafetoprotein: Secondary | ICD-10-CM

## 2017-07-23 ENCOUNTER — Other Ambulatory Visit: Payer: Self-pay | Admitting: Radiology

## 2017-07-24 ENCOUNTER — Other Ambulatory Visit (HOSPITAL_COMMUNITY): Payer: Self-pay | Admitting: Interventional Radiology

## 2017-07-24 ENCOUNTER — Ambulatory Visit (HOSPITAL_COMMUNITY)
Admission: RE | Admit: 2017-07-24 | Discharge: 2017-07-24 | Disposition: A | Discharge status: Home / Self Care | Payer: Medicare Other | Source: Ambulatory Visit | Attending: Interventional Radiology | Admitting: Interventional Radiology

## 2017-07-24 ENCOUNTER — Encounter (HOSPITAL_COMMUNITY): Payer: Self-pay

## 2017-07-24 DIAGNOSIS — C61 Malignant neoplasm of prostate: Secondary | ICD-10-CM | POA: Diagnosis not present

## 2017-07-24 DIAGNOSIS — C787 Secondary malignant neoplasm of liver and intrahepatic bile duct: Secondary | ICD-10-CM

## 2017-07-24 DIAGNOSIS — Z923 Personal history of irradiation: Secondary | ICD-10-CM | POA: Insufficient documentation

## 2017-07-24 DIAGNOSIS — I81 Portal vein thrombosis: Secondary | ICD-10-CM | POA: Diagnosis not present

## 2017-07-24 DIAGNOSIS — Z85118 Personal history of other malignant neoplasm of bronchus and lung: Secondary | ICD-10-CM | POA: Insufficient documentation

## 2017-07-24 DIAGNOSIS — M069 Rheumatoid arthritis, unspecified: Secondary | ICD-10-CM | POA: Diagnosis not present

## 2017-07-24 DIAGNOSIS — R772 Abnormality of alphafetoprotein: Secondary | ICD-10-CM

## 2017-07-24 DIAGNOSIS — Z9221 Personal history of antineoplastic chemotherapy: Secondary | ICD-10-CM | POA: Diagnosis not present

## 2017-07-24 DIAGNOSIS — C3411 Malignant neoplasm of upper lobe, right bronchus or lung: Secondary | ICD-10-CM

## 2017-07-24 DIAGNOSIS — B1920 Unspecified viral hepatitis C without hepatic coma: Secondary | ICD-10-CM | POA: Diagnosis not present

## 2017-07-24 DIAGNOSIS — Z7901 Long term (current) use of anticoagulants: Secondary | ICD-10-CM | POA: Diagnosis not present

## 2017-07-24 DIAGNOSIS — C220 Liver cell carcinoma: Secondary | ICD-10-CM | POA: Diagnosis not present

## 2017-07-24 DIAGNOSIS — Z87891 Personal history of nicotine dependence: Secondary | ICD-10-CM | POA: Insufficient documentation

## 2017-07-24 HISTORY — PX: IR ANGIOGRAM VISCERAL SELECTIVE: IMG657

## 2017-07-24 HISTORY — PX: IR ANGIOGRAM SELECTIVE EACH ADDITIONAL VESSEL: IMG667

## 2017-07-24 HISTORY — PX: IR US GUIDE VASC ACCESS RIGHT: IMG2390

## 2017-07-24 HISTORY — PX: IR EMBO ARTERIAL NOT HEMORR HEMANG INC GUIDE ROADMAPPING: IMG5448

## 2017-07-24 LAB — CBC WITH DIFFERENTIAL/PLATELET
Basophils Absolute: 0.1 10*3/uL (ref 0.0–0.1)
Basophils Relative: 1 %
Eosinophils Absolute: 0.6 10*3/uL (ref 0.0–0.7)
Eosinophils Relative: 6 %
HEMATOCRIT: 43.1 % (ref 39.0–52.0)
HEMOGLOBIN: 14.5 g/dL (ref 13.0–17.0)
LYMPHS ABS: 2.1 10*3/uL (ref 0.7–4.0)
LYMPHS PCT: 21 %
MCH: 32.6 pg (ref 26.0–34.0)
MCHC: 33.6 g/dL (ref 30.0–36.0)
MCV: 96.9 fL (ref 78.0–100.0)
MONOS PCT: 14 %
Monocytes Absolute: 1.4 10*3/uL — ABNORMAL HIGH (ref 0.1–1.0)
NEUTROS ABS: 5.6 10*3/uL (ref 1.7–7.7)
NEUTROS PCT: 58 %
PLATELETS: 217 10*3/uL (ref 150–400)
RBC: 4.45 MIL/uL (ref 4.22–5.81)
RDW: 13.7 % (ref 11.5–15.5)
WBC: 9.8 10*3/uL (ref 4.0–10.5)

## 2017-07-24 LAB — PROTIME-INR
INR: 1.03
Prothrombin Time: 13.5 seconds (ref 11.4–15.2)

## 2017-07-24 LAB — COMPREHENSIVE METABOLIC PANEL
ALBUMIN: 3.9 g/dL (ref 3.5–5.0)
ALT: 75 U/L — AB (ref 17–63)
ANION GAP: 10 (ref 5–15)
AST: 81 U/L — ABNORMAL HIGH (ref 15–41)
Alkaline Phosphatase: 77 U/L (ref 38–126)
BUN: 22 mg/dL — ABNORMAL HIGH (ref 6–20)
CHLORIDE: 102 mmol/L (ref 101–111)
CO2: 27 mmol/L (ref 22–32)
CREATININE: 0.75 mg/dL (ref 0.61–1.24)
Calcium: 9.3 mg/dL (ref 8.9–10.3)
GFR calc non Af Amer: >60 mL/min (ref >60)
GLUCOSE: 94 mg/dL (ref 65–99)
Potassium: 3.6 mmol/L (ref 3.5–5.1)
SODIUM: 139 mmol/L (ref 135–145)
Total Bilirubin: 0.6 mg/dL (ref 0.3–1.2)
Total Protein: 9.1 g/dL — ABNORMAL HIGH (ref 6.5–8.1)

## 2017-07-24 MED ORDER — FENTANYL CITRATE (PF) 100 MCG/2ML IJ SOLN
INTRAMUSCULAR | Status: AC | PRN
  Administered 2017-07-24: 50 ug via INTRAVENOUS
  Administered 2017-07-24 (×2): 25 ug via INTRAVENOUS

## 2017-07-24 MED ORDER — IOPAMIDOL (ISOVUE-300) INJECTION 61%
100.0000 mL | Freq: Once | INTRAVENOUS | Status: AC | PRN
  Administered 2017-07-24: 50 mL via INTRAVENOUS

## 2017-07-24 MED ORDER — IOPAMIDOL (ISOVUE-300) INJECTION 61%
100.0000 mL | Freq: Once | INTRAVENOUS | Status: AC | PRN
  Administered 2017-07-24: 23 mL via INTRAVENOUS

## 2017-07-24 MED ORDER — CEFAZOLIN SODIUM-DEXTROSE 2-4 GM/100ML-% IV SOLN: 2.0000 g | Freq: Once | INTRAVENOUS | Status: DC

## 2017-07-24 MED ORDER — TECHNETIUM TO 99M ALBUMIN AGGREGATED: 5.4000 | Freq: Once | INTRAVENOUS | Status: DC | PRN

## 2017-07-24 MED ORDER — MIDAZOLAM HCL 2 MG/2ML IJ SOLN
INTRAMUSCULAR | Status: AC | PRN
  Administered 2017-07-24: 0.5 mg via INTRAVENOUS
  Administered 2017-07-24: 1 mg via INTRAVENOUS
  Administered 2017-07-24: 0.5 mg via INTRAVENOUS

## 2017-07-24 MED ORDER — IOPAMIDOL (ISOVUE-300) INJECTION 61%
INTRAVENOUS | Status: AC
  Administered 2017-07-24: 50 mL via INTRAVENOUS
  Filled 2017-07-24: qty 300

## 2017-07-24 MED ORDER — MIDAZOLAM HCL 2 MG/2ML IJ SOLN
INTRAMUSCULAR | Status: AC
  Filled 2017-07-24: qty 6

## 2017-07-24 MED ORDER — HYDROCODONE-ACETAMINOPHEN 5-325 MG PO TABS
1.0000 | ORAL_TABLET | ORAL | Status: DC | PRN
Start: 2017-07-24 — End: 2017-07-25

## 2017-07-24 MED ORDER — FENTANYL CITRATE (PF) 100 MCG/2ML IJ SOLN
INTRAMUSCULAR | Status: AC
  Filled 2017-07-24: qty 6

## 2017-07-24 MED ORDER — SODIUM CHLORIDE 0.9 % IV SOLN
INTRAVENOUS | Status: DC
  Administered 2017-07-24 (×2): via INTRAVENOUS

## 2017-07-24 MED ORDER — LIDOCAINE-EPINEPHRINE (PF) 2 %-1:200000 IJ SOLN
INTRAMUSCULAR | Status: AC | PRN
  Administered 2017-07-24: 10 mL

## 2017-07-24 MED ORDER — CEFAZOLIN SODIUM-DEXTROSE 2-4 GM/100ML-% IV SOLN
INTRAVENOUS | Status: AC
  Administered 2017-07-24: 2000 mg
  Filled 2017-07-24: qty 100

## 2017-07-24 MED ORDER — SODIUM CHLORIDE 0.9 % IV SOLN: INTRAVENOUS | Status: DC

## 2017-07-24 MED ORDER — LIDOCAINE-EPINEPHRINE (PF) 2 %-1:200000 IJ SOLN
INTRAMUSCULAR | Status: AC
  Filled 2017-07-24: qty 20

## 2017-07-24 NOTE — Sedation Documentation (Signed)
Patient denies pain and is resting comfortably.  

## 2017-07-24 NOTE — Discharge Instructions (Signed)
NEXT VISIT PRECAUTIONS LISTED BELOW:      Post Y-90 Radioembolization Discharge Instructions  You have been given a radioactive material during your procedure.  While it is safe for you to be discharged home from the hospital, you need to proceed directly home.    Do not use public transportation, including air travel, lasting more than 2 hours for 1 week.  Avoid crowded public places for 1 week.  Adult visitors should try to avoid close contact with you for 1 week.    Children and pregnant females should not visit or have close contact with you for 1 week.  Items that you touch are not radioactive.  Do not sleep in the same bed as your partner for 1 week, and a condom should be used for sexual activity during the first 24 hours.  Your blood may be radioactive and caution should be used if any bleeding occurs during the recovery period.  Body fluids may be radioactive for 24 hours.  Wash your hands after voiding.  Men should sit to urinate.  Dispose of any soiled materials (flush down toilet or place in trash at home) during the first day.  Drink 6 to 8 glasses of fluids per day for 5 days to hydrate yourself.  If you need to see a doctor during the first week, you must let them know that you were treated with yttrium-90 microspheres, and will be slightly radioactive.  They can call Interventional Radiology [phone] with any questions.    Moderate Conscious Sedation, Adult, Care After These instructions provide you with information about caring for yourself after your procedure. Your health care provider may also give you more specific instructions. Your treatment has been planned according to current medical practices, but problems sometimes occur. Call your health care provider if you have any problems or questions after your procedure. What can I expect after the procedure? After your procedure, it is common:  To feel sleepy for several hours.  To feel clumsy and have poor  balance for several hours.  To have poor judgment for several hours.  To vomit if you eat too soon.  Follow these instructions at home: For at least 24 hours after the procedure:   Do not: ? Participate in activities where you could fall or become injured. ? Drive. ? Use heavy machinery. ? Drink alcohol. ? Take sleeping pills or medicines that cause drowsiness. ? Make important decisions or sign legal documents. ? Take care of children on your own.  Rest. Eating and drinking  Follow the diet recommended by your health care provider.  If you vomit: ? Drink water, juice, or soup when you can drink without vomiting. ? Make sure you have little or no nausea before eating solid foods. General instructions  Have a responsible adult stay with you until you are awake and alert.  Take over-the-counter and prescription medicines only as told by your health care provider.  If you smoke, do not smoke without supervision.  Keep all follow-up visits as told by your health care provider. This is important. Contact a health care provider if:  You keep feeling nauseous or you keep vomiting.  You feel light-headed.  You develop a rash.  You have a fever. Get help right away if:  You have trouble breathing. This information is not intended to replace advice given to you by your health care provider. Make sure you discuss any questions you have with your health care provider. Document Released: 09/17/2013 Document Revised:  05/01/2016 Document Reviewed: 03/18/2016 Elsevier Interactive Patient Education  [employer].

## 2017-07-24 NOTE — Procedures (Signed)
Interventional Radiology Procedure Note  Procedure:  Hepatic and left gastric arteriography; embolization of left gastric artery branch; MAA injection into accessory left hepatic artery  Complications: None  Estimated Blood Loss: < 10 mL  Findings: Left gastric artery terminates in major accessory supply to entire lateral segment of left lobe of liver and is the dominant supply to left lobe tumor. Side branch of the left gastric artery supplying stomach embolized with 2 and 3 mm coils with embolization into 2 branches beyond an early bifurcation of the vessel. 5 mCi MAA injected into left gastric artery beyond embolized gastric branch. Common hepatic and left hepatic arteriography shows some supply of tumor by left hepatic artery off of proper hepatic artery, but relatively small amount compared to left gastric supply. Right gastric artery originates off of proximal left hepatic artery. Focal perfusion anomaly/shunting in peripheral right lobe of liver.  Plan: NM scanning now with pulmonary shunt calculation. 4 hour bedrest recovery.  Venetia Night. Kathlene Cote, M.D Pager:  [phone]

## 2017-07-24 NOTE — Progress Notes (Signed)
[facility] day:  07/25/2017   Chief Complaint: Gerald [name] is a 66 y.o. male with stage IV adenocarcinoma of the lung and suspected new hepatocellular carcinoma who is seen following liver biopsy to discuss direction of therapy.  HPI:  The patient was last seen in the medical oncology clinic on 07/05/2017 and 07/10/2017.  During the 07/05/2017 visit, we reviewed the results of his abdominal MRI, which demonstrated findings that were worrisome for hepatocellular carcinoma. He returned to the clinic on 07/10/2017 to discuss treatment options, where he ultimately agreed to having an ultrasound guided biopsy done to evaluate the suspicious hepatic findings noted on MRI.   He underwent ultrasound guided liver biopsy on 07/18/2017.  Surgical pathology reports have not be finalized.   In review of procedure notes from Dr. Kathlene Cote (radiology), he has spoken with Dr. Reuel Derby (pathology) who has indicated that the preliminary results are consistent with the previously suspected hepatocellular carcinoma; awaiting further staining at this time. Dr. Kathlene Cote proceeded with planning arteriography, embolization and shunt calculation procedures on 07/24/2017, with plans for Y-90 radioembolization in 10-14 days (08/08/2017).   Symptomatically, he has done well since the procedure. Patient reports that he is not currently taking the ordered Lovenox citing financial issues. Patient never started taking the Lovenox as ordered, rather he has continued to take the Xarelto that he was on before. Per Dr. Margaretmary Dys recommendations, patient will restart anticoagulation therapy today. Patient continues to report pain in the right upper quadrant of his abdomen, which has worsened since his recent liver biopsy. Patient reports that he is using more of his breakthrough pain medication and is running low.   Past Medical History:  Diagnosis Date  . Arthritis    Rheumatoid  arthritis  . Cancer ([place])    lung ca   . Collagen vascular disease (HCC)    RA  . Dyspnea   . Hepatitis    b and c  . Mass of lung   . Pneumonia   . Pneumothorax    spontaneous  . Renal disorder    as a child    Past Surgical History:  Procedure Laterality Date  . FLEXIBLE BRONCHOSCOPY N/A 03/30/2016   Procedure: FLEXIBLE BRONCHOSCOPY;  Surgeon: Vilinda Boehringer, MD;  Location: [facility] ORS;  Service: Cardiopulmonary;  Laterality: N/A;  . IR ANGIOGRAM SELECTIVE EACH ADDITIONAL VESSEL  07/24/2017  . IR ANGIOGRAM SELECTIVE EACH ADDITIONAL VESSEL  07/24/2017  . IR ANGIOGRAM SELECTIVE EACH ADDITIONAL VESSEL  07/24/2017  . IR ANGIOGRAM SELECTIVE EACH ADDITIONAL VESSEL  07/24/2017  . IR ANGIOGRAM SELECTIVE EACH ADDITIONAL VESSEL  07/24/2017  . IR ANGIOGRAM SELECTIVE EACH ADDITIONAL VESSEL  07/24/2017  . IR ANGIOGRAM SELECTIVE EACH ADDITIONAL VESSEL  07/24/2017  . IR ANGIOGRAM VISCERAL SELECTIVE  07/24/2017  . IR EMBO ARTERIAL NOT HEMORR HEMANG INC GUIDE ROADMAPPING  07/24/2017  . IR US GUIDE VASC ACCESS RIGHT  07/24/2017  . Ureter repair as a child      Family History  Problem Relation Age of Onset  . Brain cancer Mother   . Lung cancer Maternal Uncle   . Cancer Sister        Metastatic cancer- unknown primary    Social History:  reports that he quit smoking about 17 years ago. His smoking use included Cigarettes. He has a 13.00 pack-year smoking history. He has never used smokeless tobacco. He reports that he drinks alcohol. He reports that he does not use drugs.  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  He lives in [place].  Contact number is [phone].  He has a great pyrenees.  He has been working on his truck.  The patient is accompanied by his sister in law, Angie, today.  Allergies:  Allergies  Allergen Reactions  . Nsaids Other (See Comments)    Pt is unable to take this class of medications.    . Sulfa Antibiotics Other (See  Comments)    Reaction:  GI upset     Current Medications: Current Outpatient Prescriptions  Medication Sig Dispense Refill  . folic acid (FOLVITE) 1 MG tablet Take 1 tablet (1 mg total) by mouth daily. 30 tablet 3  . gabapentin (NEURONTIN) 300 MG capsule Take 1 capsule (300 mg total) by mouth at bedtime. 30 capsule 2  . ondansetron (ZOFRAN) 8 MG tablet Take 1 tablet (8 mg total) by mouth 2 (two) times daily as needed for nausea or vomiting. 20 tablet 0  . oxyCODONE (OXY IR/ROXICODONE) 5 MG immediate release tablet Take 1 tablet (5 mg total) by mouth every 4 (four) hours as needed for severe pain. 30 tablet 0  . rivaroxaban (XARELTO) 20 MG TABS tablet Take 1 tablet (20 mg total) by mouth daily with supper. 30 tablet 1  . calcium-vitamin D (OSCAL WITH D) 500-200 MG-UNIT tablet Take 1 tablet by mouth 2 (two) times daily. 180 tablet 0  . enoxaparin (LOVENOX) 60 MG/0.6ML injection Inject 0.6 mLs (60 mg total) into the skin every 12 (twelve) hours. (Patient not taking: Reported on 07/18/2017) 60 Syringe 2  . HYDROcodone-acetaminophen (NORCO) 7.5-325 MG tablet Take 1 tablet by mouth every 4 (four) hours as needed for moderate pain (Every 4-6 hours as needed for pain). (Patient not taking: Reported on 07/18/2017) 30 tablet 0  . oxyCODONE-acetaminophen (PERCOCET/ROXICET) 5-325 MG tablet Take 1 tablet by mouth every 6 (six) hours as needed for severe pain. (Patient not taking: Reported on 07/18/2017) 30 tablet 0  . sucralfate (CARAFATE) 1 g tablet Take 1 tablet (1 g total) by mouth 3 (three) times daily. Dissolve in 2-3 tbsp warm water, swish and swallow. (Patient not taking: Reported on 07/18/2017) 90 tablet 3   No current facility-administered medications for this visit.    Facility-Administered Medications Ordered in Other Visits  Medication Dose Route Frequency Provider Last Rate Last Dose  . technetium albumin aggregated (MAA) injection solution 5.4 millicurie  5.4 millicurie Intravenous Once PRN Gus Height, MD        Review of Systems:  GENERAL:  Feels "fine". No fever, chills or sweats.  Weight is stable. PERFORMANCE STATUS (ECOG):  2 HEENT: No visual changes, sore throat, mouth sores or tenderness. Lungs:  No shortness of breath or cough. No hemoptysis. Cardiac:  No chest pain, palpitations, orthopnea, or PND.  Sleeps in a recliner. GI:  Appetite good.  No nausea, vomiting, diarrhea, constipation, melena or hematochezia. GU:  RIGHT upper abdominal tenderness. No urgency, frequency, dysuria, or hematuria. Musculoskeletal:  Chronic right knee pain. No back pain.  No muscle tenderness. Extremities:  No pain or swelling. Skin:  No rashes or ulcers. Neuro:  Neuropathy (stable) on gabapentin.  No headache, focal weakness, balance or coordination issues. Endocrine:  No diabetes, thyroid issues, hot flashes or night sweats. Psych:  No mood changes, depression or anxiety. Pain:  No pain.  Review of systems:  All other systems reviewed and found to be negative.  Physical Exam:  Blood pressure 100/70, pulse 97, temperature 97.8 F (36.6 C), temperature source Tympanic, resp. rate 20, weight 131 lb 6.3 oz (59.6 kg). GENERAL:  Thin gentleman sitting comfortably in the exam room in no acute distress. MENTAL STATUS:  Alert and oriented to person, place and time.  HEAD:Wearing a cap. Long gray hair in pony tail. Albertina Parr. Temporal wasting. Normocephalic, atraumatic, face symmetric, no Cushingoid features. EYES:Blue eyes. Pupils equal round and reactive to light and accomodation. No conjunctivitis or scleral icterus. NWG:NFAOZHYQMV clear without lesion. Tonguenormal. Mucous membranes moist. RESPIRATORY:Clear to auscultationwithout rales, wheezes or rhonchi. CARDIOVASCULAR:Regular rate andrhythmwithout murmur, rub or gallop. ABDOMEN:Soft, slightly tender in the RUQ without guarding or rebound tenderness.  Active bowel sounds and no hepatosplenomegaly. No masses. SKIN:  Dry.  No rashes. EXTREMITIES: No edema, no skin discoloration or tenderness. No palpable cords. LYMPHNODES: No palpable cervical, supraclavicular, axillary or inguinal adenopathy  NEUROLOGICAL: Unremarkable. PSYCH: Appropriate.    Imaging studies: 03/27/2016:  Chest CT angiogram revealed an 8 cm mass in the medial right upper lobe. The mass abutted the mediastinum and possibly invaded the left atrium. There was associated widespread thoracic adenopathy. There was a 2.6 cm enhancing lesion in the medial segment of the left hepatic lobe. 04/05/2016:  PET scan revealed intense hypermetabolism (SUV 17.95) associated with a 7.5 cm central perihilar right lung lesion. There was mediastinal (sub-carinal node SUV 8.89; 2.6 cm right paratracheal node SUV 17.49) and upper abdominal retroperitoneal hypermetabolic adenopathy (9 mm aortocaval node SUV 9.2). There were 2 liver metastasis (2.3 cm left lobe SUV 6.19; 2.2 cm segment VIII SUV 5.18). There are multifocal bone metastasis (C2 vertebral body SUV 9.0; 1.6 cm left sacral ala lesion SUV of 8.6).  03/31/2016:  Head MRI was indeterminate for early metastatic disease to the brain. There was a small focus of gyral enhancement in the anterior right frontal lobe likely is post ischemic, while a small posterior right cerebellar enhancing lesion was more suspicious for small brain metastasis. There were multiple small primarily subacute appearing infarcts in the bilateral MCA and left PICA territories.  04/07/2016:  Bone scan revealed a subtle distal right femur lesion which might be a small metastasis.  Bone scan was felt to be an unreliable modality for evaluation of osseous metastatic disease as the bone metastases seen on the recent PET and MRI were not evident. 05/12/2016:  Head MRI revealed expected interval evolution of bilateral cerebral in cerebellar infarcts. There was resolution of right frontal and right cerebellar enhancement consistent with  subacute infarcts. There was a new 3 mm focus of gyral enhancement in the left occipital lobe and a possible new 4 mm enhancing lesion in the left cerebellum (? vascular enhancement versus tiny metastasis).  08/16/2016:  Head MRI revealed multiple areas of improving enhancement and restricted diffusion compatible with resolving subacute infarcts.  There was no evidence of metastatic disease or acute infarction  10/19/2016:  Chest, abdomen, and pelvic CT scan revealed radiation changes in the right lung.  The superimposed right lung pneumonia had improved.  The 6.4 x 2.4 cm right perihilar mass had decreased.  There was improving thoracic lymphadenopathy.  There was progression of hepatic metastases, measuring up to 4.7 cm.  Specifically, there was a 3.9 x 3.7 cm metastasis in segment 8 (previously 2.5 x 2.8 cm) and a 4.7 x 4.0 cm metastasis in segment 4B (previously 3.1 x 2.6 cm). 11/14/2016:  PET scan revealed 2 liver masses. Both were significantly more hypermetabolic than on 78/46/9629 and have  enlarged compared to the prior PET-CT.  The segment 8 lesion was centrally necrotic but with high activity along its periphery (7.4), and about the same size as it was on 10/19/2016. The lateral segment left hepatic lobe lesion had enlarged (5.9 x 5.3 cm) compared to 10/19/16 (4.1 x 4.4 cm).  The bony metastatic lesions were no longer hypermetabolic and have resolved.  There were post therapy and postoperative findings in the right lung, with a considerable amount of suspected radiation pneumonitis and radiation fibrosis. 12/29/2016:  Chest, abdomen, and pelvic CT revealed mild reduction in size and central necrosis of the hepatic metastatic lesions.  There was continued post therapy related findings along the right hilar region. Dominant right perihilar mass was reduced in thickness compared to 10/19/2016.  04/13/2017:  Chest, abdomen, and pelvic CT revealed new left hepatic vein thrombosis. This somewhat obscured  the known left hepatic lobe tumor although overall the left hepatic lobe masses were thought to be similar in size compared the prior exam. The segment 8 lesion was less well seen than on the prior exam.  The central necrotic portion was seen but the peripheral rim was much less conspicuous (could represent improvement in this tumor).  There was mild increase in adenopathy in the gastrohepatic ligament.  There was essentially stable appearance in the lungs with considerable radiation pneumonitis, scarring, and volume loss in the right lung, along with a trace loculated pleural effusion with enhancing margins.  There was stable appearance of prior splenic infarcts and prior scarring in the left mid kidney. 07/02/2017:  Chest, abdomen, and pelvic CT revealed interval increase in the left hepatic vein thrombosis with progressive heterogeneity and enhancement of the left hepatic lobe, predominantly obscuring the known left hepatic lobe lesion. There was a possible new 1.5 cm lateral left hepatic lobe lesion versus focal area of heterogeneity. There was slight interval decrease in the size of the irregular low-attenuation lesion within segment 8 of the liver. Right lung was stable in appearance with interval development of consolidation within the lingula representing atelectasis or infection. 07/05/2017:  Abdominal MRI revealed persistent mass within lateral segment of left lobe of liver with progressive thrombosis of the left portal vein. There was also persistent thrombosis of the left hepatic vein. Portal vein thrombus extended up to the level of the main portal vein and was likely tumor thrombus. The liver has morphologic features suggestive of early cirrhosis. All findings are highly suggestive that the lesion in the left lobe of liver represents hepatoma rather than lung cancer metastasis. The lesion within segment 8 of the liver had decreased in size when compared with 04/13/2017.  The small lesion within  segment 7 of the liver appeared new from previous exam. Indeterminate. This may represent an area of lung cancer metastasis or possibly multifocal [facility] Outpatient Visit on 07/24/2017  Component Date Value Ref Range Status  . Prothrombin Time 07/24/2017 13.5  11.4 - 15.2 seconds Final  . INR 07/24/2017 1.03   Final  Hospital Outpatient Visit on 07/24/2017  Component Date Value Ref Range Status  . WBC 07/24/2017 9.8  4.0 - 10.5 K/uL Final  . RBC 07/24/2017 4.45  4.22 - 5.81 MIL/uL Final  . Hemoglobin 07/24/2017 14.5  13.0 - 17.0 g/dL Final  . HCT 07/24/2017 43.1  39.0 - 52.0 % Final  . MCV 07/24/2017 96.9  78.0 - 100.0 fL Final  . MCH 07/24/2017 32.6  26.0 - 34.0 pg Final  . MCHC 07/24/2017 33.6  30.0 - 36.0 g/dL Final  . RDW 07/24/2017 13.7  11.5 - 15.5 % Final  . Platelets 07/24/2017 217  150 - 400 K/uL Final  . Neutrophils Relative % 07/24/2017 58  % Final  . Neutro Abs 07/24/2017 5.6  1.7 - 7.7 K/uL Final  . Lymphocytes Relative 07/24/2017 21  % Final  . Lymphs Abs 07/24/2017 2.1  0.7 - 4.0 K/uL Final  . Monocytes Relative 07/24/2017 14  % Final  . Monocytes Absolute 07/24/2017 1.4* 0.1 - 1.0 K/uL Final  . Eosinophils Relative 07/24/2017 6  % Final  . Eosinophils Absolute 07/24/2017 0.6  0.0 - 0.7 K/uL Final  . Basophils Relative 07/24/2017 1  % Final  . Basophils Absolute 07/24/2017 0.1  0.0 - 0.1 K/uL Final  . Sodium 07/24/2017 139  135 - 145 mmol/L Final  . Potassium 07/24/2017 3.6  3.5 - 5.1 mmol/L Final  . Chloride 07/24/2017 102  101 - 111 mmol/L Final  . CO2 07/24/2017 27  22 - 32 mmol/L Final  . Glucose, Bld 07/24/2017 94  65 - 99 mg/dL Final  . BUN 07/24/2017 22* 6 - 20 mg/dL Final  . Creatinine, Ser 07/24/2017 0.75  0.61 - 1.24 mg/dL Final  . Calcium 07/24/2017 9.3  8.9 - 10.3 mg/dL Final  . Total Protein 07/24/2017 9.1* 6.5 - 8.1 g/dL Final  . Albumin 07/24/2017 3.9  3.5 - 5.0 g/dL Final  . AST 07/24/2017 81* 15 - 41 U/L Final  . ALT 07/24/2017 75* 17 -  63 U/L Final  . Alkaline Phosphatase 07/24/2017 77  38 - 126 U/L Final  . Total Bilirubin 07/24/2017 0.6  0.3 - 1.2 mg/dL Final  . GFR calc non Af Amer 07/24/2017 >60  >60 mL/min Final  . GFR calc Af Amer 07/24/2017 >60  >60 mL/min Final   Comment: (NOTE) The eGFR has been calculated using the CKD EPI equation. This calculation has not been validated in all clinical situations. eGFR's persistently <60 mL/min signify possible Chronic Kidney Disease.   . Anion gap 07/24/2017 10  5 - 15 Final    Assessment:  Eliot Popper is a 66 y.o. male with stage IV adenocarcinoma of the lung and associated leukocytosis with eosinophilia. He presented with a 30 pound weight loss, shortness of breath, cough, and left leg discomfort. He has a 13-15 pack year smoking history.  Bronchoscopy on 03/30/2016 revealed an endobronchial lesion in the RUL anterior segment with 95% occlusion. There was extrinsic compression in the right middle lobe secondary to posterior mass effect. Pathology confirmed non-small cell lung cancer, favor adenocarcinoma.  PD-L1 testing revealed high expression (> 50%).  EGFR, ALK, and ROS1 were negative.  CEA was 2.3 on 03/27/2016.  PET scan on 04/05/2016 revealed intense hypermetabolism (SUV 17.95) associated with a 7.5 cm central perihilar right lung lesion. There was mediastinal (sub-carinal node SUV 8.89; 2.6 cm right paratracheal node SUV 17.49) and upper abdominal retroperitoneal hypermetabolic adenopathy (9 mm aortocaval node SUV 9.2). There were 2 liver metastasis (2.3 cm left lobe SUV 6.19; 2.2 cm segment VIII SUV 5.18). There are multifocal bone metastasis (C2 vertebral body SUV 9.0; 1.6 cm left sacral ala lesion SUV of 8.6).   He had marked leukocytosis with hypereosinophilia. Initial WBC was 72,300 with 53% eosinophils. Bone marrow on 03/29/2016 revealed marked increase in marrow eosinophils (approximate 30%). There was no diagnostic morphologic evidence of a  myeloproliferative or lymphoid neoplasm. Flow cytometry revealed significant increase in eosinophils (54%). There was relative decreased  myeloid cells with no significant immunophenotypic abnormalities or increase in blasts. There was no lymphoid abnormalities or evidence of clonality. FISH studies were negative for myeloproliferative neoplasms with eosinophilia (PDGFRA, PDGFRB, FGFR1). BCR-ABL was negative on 03/28/2016.  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  Left lower extremity duplex on 03/25/2016 revealed no evidence of DVT. Discomfort improved on Neurontin 300 mg a day and continues to improve with declining eosinophilia.  He received 41.4 Gy from 04/11/2016 - 05/23/2016.  He received 4 weeks of concurrent carboplatin and Taxol (04/17/2016 - 05/22/2016).    He has received 15 cycles of Keytruda (07/03/2016 - 10/12/2016; 11/22/2016 - 06/05/2017).  He has tolerated treatment well.  He receives [place] every 6 weeks (began 05/22/2016; last 06/26/2017).  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  He was on Xarelto from 04/13/2017 - 07/24/2017. Lovenox injections were not started on 07/13/2017 as ordered; ordered again to start 07/25/2017.   He has increased liver function tests.  Etiology is possibly secondary to acute hepatitis, irritation due to thrombosis, or progressive disease (metastatic liver lesion or [place]).  He is hepatitis B core antibody and hepatitis C antibody positive.  Hepatitis C RNA was 5.474 log10 IU/ml (298,000 IU/ml) on 07/03/2017.  Hepatitis C genotype is 1a.  AFP was 589.7 on 07/03/2017.  Chest, abdomen, and pelvic CT on 07/02/2017 revealed interval increase in the left hepatic vein thrombosis with progressive heterogeneity and enhancement of the left hepatic lobe, predominantly obscuring the known left hepatic lobe lesion. There was a possible new 1.5 cm lateral left hepatic lobe lesion  versus focal area of heterogeneity. There was slight interval decrease in the size of the irregular low-attenuation lesion within segment 8 of the liver. Right lung was stable in appearance with interval development of consolidation within the lingula representing atelectasis or infection.  Abdominal MRI on 07/05/2017 revealed persistent mass within lateral segment of left lobe of liver with progressive thrombosis of the left portal vein. There was also persistent thrombosis of the left hepatic vein. Portal vein thrombus extended up to the level of the main portal vein and was likely tumor thrombus. The liver has morphologic features suggestive of early cirrhosis. All findings are highly suggestive that the lesion in the left lobe of liver represents hepatoma rather than lung cancer metastasis. The lesion within segment 8 of the liver had decreased in size when compared with 04/13/2017.  The small lesion within segment 7 of the liver appeared new from previous exam. Indeterminate. This may represent an area of lung cancer metastasis or possibly multifocal HCC.  He underwent arteriography, embolization and shunt calculations on 07/24/2017 in preparation for Y-90 radioembolization of his liver.  Code status is DNR/DNI.  Symptomatically, he feels has RUQ abdominal discomfort, slightly worse since recent liver biopsy. He denies any respiratory symptoms.   Plan: 1.  Review liver biopsy results from procedure done on 07/18/2017. Awaiting final surgical pathology report, however preliminary results are consistent with hepatocellular carcinoma. Patient is also under the care of Dr. Kathlene Cote at [facility], and is currently planned to have Y-90 radioembolization procedure on 08/08/2017.   2.  Discuss treatment options going forward.  Discussed plans to restart [place] and Xgeva. Spoke with Dr. Kathlene Cote via telephone this morning regarding the proposed plan of care for this patient. Dr. Kathlene Cote notes  that Beryle Flock can be restarted 1 to 2 weeks following the Y-90 procedure depending on how the patient feels and  the status of his LFTs. We expect an initial elevation in his LFTs following the Y-90 procedure. 3.  Discussed anticoagulation. Encouraged patient to begin Lovenox injections as previously ordered. Dr. Madelyn Brunner cleared patient to restart anticoagulation therapy today. Dr. Kathlene Cote agrees that anticoagulation is in the best interest. 4.  Patient with increased pain in the RIGHT upper quadrant of his abdomen associated with liver issues. Patient to keep a pain diary over the next week. Will consider adjusting medication if his regimen is not controlling his symptoms.  5.  Rx:  oxicodone 5-37m every 4 hours prn (dis: #40). 6.  Consult GI (Dr WAllen Norrisor Dr AVicente Males to follow up on hepatitis C. 7.  [place]. 8.  RTC in 3 weeks for MD assessment and labs (CBC, CMP).   BHonor Loh NP  07/25/2017, 11:37 AM   I saw and evaluated the patient, participating in the key portions of the service and reviewing pertinent diagnostic studies and records.  I reviewed the nurse practitioner's note and agree with the findings and the plan.  The assessment and plan were discussed with the patient.  Multiple questions were asked by the patient and answered.    MLequita Asal MD 07/25/2017,3:09 PM

## 2017-07-24 NOTE — Progress Notes (Signed)
Patient resting comfortably post (pre) Y90. On Dinamap HR typically in 80s but RN noticed it dropping quickly down to 40s/30s even as low as 29 HR. Patient talking states he doesn't feel any different. States no knowledge of ever being diagnosed with afib/irregular heart beat. Pulse ox on each hand with same readings. Felt radial pulse as well ausculted with stethescope. Irregular heart beat noted. Ascencion Dike PA called and made aware EKG ordered and being done now.

## 2017-07-24 NOTE — H&P (Signed)
Chief Complaint: Patient was seen in consultation today for pre Y-90 arteriogram at the request of [facility]  Referring Physician(s): Dr. Nolon Stalls  Supervising Physician: Aletta Edouard  Patient Status: [place]  History of Present Illness: Gerald [name] is a 66 y.o. male being worked up for hepatic lesion. He underwent biopsy last week and pathology is most consistent with [place], though the final read has not been released yet. He has seen Dr. Kathlene Cote and discussed treatment options and has been determined to be a candidate for Y-90 radioembolization. He is scheduled today for pre Y-90 arteriogram and test dosing. PMHx, meds, labs, imaging reviewed. He resumed his Xarelto after the biopsy but has stopped it again for this procedure. Last dose was 8/11. Has been NPO this am. He feels well, no c/o fevers, chills, illness Daughter is at bedside.  Past Medical History:  Diagnosis Date  . Arthritis    Rheumatoid arthritis  . Cancer ([place])    lung ca   . Collagen vascular disease (HCC)    RA  . Dyspnea   . Hepatitis    b and c  . Mass of lung   . Pneumonia   . Pneumothorax    spontaneous  . Renal disorder    as a child    Past Surgical History:  Procedure Laterality Date  . FLEXIBLE BRONCHOSCOPY N/A 03/30/2016   Procedure: FLEXIBLE BRONCHOSCOPY;  Surgeon: Vilinda Boehringer, MD;  Location: [facility] ORS;  Service: Cardiopulmonary;  Laterality: N/A;  . Ureter repair as a child      Allergies: Nsaids and Sulfa antibiotics  Medications: Prior to Admission medications   Medication Sig Start Date End Date Taking? Authorizing Provider  folic acid (FOLVITE) 1 MG tablet Take 1 tablet (1 mg total) by mouth daily. 11/16/16  Yes Corcoran, Drue Second, MD  gabapentin (NEURONTIN) 300 MG capsule Take 1 capsule (300 mg total) by mouth at bedtime. 11/07/16  Yes Corcoran, Drue Second, MD  calcium-vitamin D (OSCAL WITH D) 500-200 MG-UNIT tablet Take 1 tablet by mouth 2 (two)  times daily. 09/20/16 06/05/17  Creola Corn, MD  HYDROcodone-acetaminophen (NORCO) 7.5-325 MG tablet Take 1 tablet by mouth every 4 (four) hours as needed for moderate pain (Every 4-6 hours as needed for pain). Patient not taking: Reported on 07/18/2017 07/03/16   Lequita Asal, MD  ondansetron (ZOFRAN) 8 MG tablet Take 1 tablet (8 mg total) by mouth 2 (two) times daily as needed for nausea or vomiting. 04/17/16   Lequita Asal, MD  oxyCODONE (OXY IR/ROXICODONE) 5 MG immediate release tablet Take 1 tablet (5 mg total) by mouth every 4 (four) hours as needed for severe pain. 06/26/17   Lequita Asal, MD  oxyCODONE-acetaminophen (PERCOCET/ROXICET) 5-325 MG tablet Take 1 tablet by mouth every 6 (six) hours as needed for severe pain. Patient not taking: Reported on 07/18/2017 03/27/17   Lequita Asal, MD  rivaroxaban (XARELTO) 20 MG TABS tablet Take 1 tablet (20 mg total) by mouth daily with supper. 05/11/17   Lequita Asal, MD  sucralfate (CARAFATE) 1 g tablet Take 1 tablet (1 g total) by mouth 3 (three) times daily. Dissolve in 2-3 tbsp warm water, swish and swallow. Patient not taking: Reported on 07/18/2017 05/02/16   Noreene Filbert, MD     Family History  Problem Relation Age of Onset  . Brain cancer Mother   . Lung cancer Maternal Uncle   . Cancer Sister  Metastatic cancer- unknown primary    Social History   Social History  . Marital status: Legally Separated    Spouse name: N/A  . Number of children: N/A  . Years of education: N/A   Social History Main Topics  . Smoking status: Former Smoker    Packs/day: 0.50    Years: 26.00    Types: Cigarettes    Quit date: 06/08/2000  . Smokeless tobacco: Never Used     Comment: Quit about 5 years ago  . Alcohol use 0.0 oz/week     Comment: occasionally beer  . Drug use: No  . Sexual activity: Not Asked   Other Topics Concern  . None   Social History Narrative   Lives at home with son.     Review  of Systems: A 12 point ROS discussed and pertinent positives are indicated in the HPI above.  All other systems are negative.  Review of Systems  Vital Signs: BP 107/62 (BP Location: Right Arm)   Pulse 94   Temp 97.7 F (36.5 C) (Oral)   Resp 18   SpO2 100%   Physical Exam  Constitutional: He is oriented to person, place, and time. He appears well-developed. No distress.  HENT:  Head: Normocephalic.  Mouth/Throat: Oropharynx is clear and moist.  Neck: Normal range of motion. No JVD present. No tracheal deviation present.  Cardiovascular: Normal rate, regular rhythm, normal heart sounds and intact distal pulses.   Pulmonary/Chest: Effort normal and breath sounds normal. No respiratory distress.  Abdominal: Soft. He exhibits no distension and no mass. There is no tenderness.  Neurological: He is alert and oriented to person, place, and time.  Skin: Skin is warm and dry.  Psychiatric: He has a normal mood and affect. Judgment normal.    Mallampati Score:  MD Evaluation Airway: WNL Heart: WNL Abdomen: WNL Chest/ Lungs: WNL ASA  Classification: 2 Mallampati/Airway Score: Two  Imaging: Ct Chest W Contrast  Result Date: 07/02/2017 CLINICAL DATA:  Patient with history of metastatic right upper lobe carcinoma. History of chemotherapy and radiation. Restaging exam. EXAM: CT CHEST, ABDOMEN, AND PELVIS WITH CONTRAST TECHNIQUE: Multidetector CT imaging of the chest, abdomen and pelvis was performed following the standard protocol during bolus administration of intravenous contrast. CONTRAST:  37mL ISOVUE-300 IOPAMIDOL (ISOVUE-300) INJECTION 61% COMPARISON:  CT CAP 04/13/2017 FINDINGS: CT CHEST FINDINGS Cardiovascular: Rightward shift of the mediastinum. Normal heart size. No pericardial effusion. Aorta and main pulmonary artery are normal in caliber. Mediastinum/Nodes: No enlarged axillary, mediastinal or hilar lymphadenopathy. Normal esophagus. Lungs/Pleura: Central airways are patent.  Stable appearing right paramediastinal and right perihilar opacities most compatible with radiation changes. Unchanged scarring within the left lung apex. Similar-appearing 1.6 cm right perihilar mass (image 24; series 2). Unchanged trace loculated right pleural effusion (image 26; series 2). Interval development of consolidative opacities within the lingula (image 5-7; series 5). No pneumothorax. Musculoskeletal: No aggressive or acute appearing osseous lesions. CT ABDOMEN PELVIS FINDINGS Hepatobiliary: Progression of hepatic vein thrombosis within the left hepatic lobe with increased heterogeneity of the left hepatic lobe enhancement, limiting evaluation of known left hepatic lobe lesion. Overall there is increased heterogeneity throughout the left hepatic lobe when compared to recent prior. Additionally there is suggestion of a possible new lesion within the left hepatic lobe measuring 1.5 cm (image 55; series 2). Slight interval decrease in size of triangular-shaped low-attenuation lesion within segment 8 measuring 1.7 x 1.2 cm (image 52; series 2), previously 1.5 x 2.1 cm. Re- demonstrated transient  hepatic attenuation within segment 6 (image 60; series 2). Similar-appearing lesion within segment 3 of the liver measuring 1.6 x 1.2 cm (image 69; series 2), previously 1.6 x 1.3 cm. Pancreas: Unremarkable Spleen: Lobular in contour, unchanged. Adrenals/Urinary Tract: Normal adrenal glands. Unchanged scarring medial left kidney, interpolar region. No hydronephrosis. Urinary bladder is unremarkable. Stomach/Bowel: No abnormal bowel wall thickening or evidence for bowel obstruction. No free fluid or free intraperitoneal air. Normal morphology of the stomach. Vascular/Lymphatic: Peripheral calcified atherosclerotic plaque involving the abdominal aorta. Stable subcentimeter retroperitoneal lymph nodes. This includes an unchanged 8 mm gastrohepatic lymph node (image 58; series 2). Reproductive: Unremarkable. Other:  None. Musculoskeletal: No aggressive or acute appearing osseous lesions. IMPRESSION: Interval increase in left hepatic vein thrombosis with progressive heterogeneity in enhancement of the left hepatic lobe, predominately obscuring the known left hepatic lobe lesion. Possible new 1.5 cm lateral left hepatic lobe lesion versus focal area of heterogeneity. Slight interval decrease in size of irregular low-attenuation lesion within segment 8 of the liver. Stable appearance of the right lung. Interval development of consolidation within the lingula which may represent atelectasis or infection. Recommend attention on follow-up as underlying lesion is not entirely excluded. Aortic atherosclerosis. Electronically Signed   By: Lovey Newcomer M.D.   On: 07/02/2017 14:42   Mr Abdomen W Wo Contrast  Result Date: 07/05/2017 CLINICAL DATA:  Stage IV lung cancer with possible liver metastases EXAM: MRI ABDOMEN WITHOUT AND WITH CONTRAST TECHNIQUE: Multiplanar multisequence MR imaging of the abdomen was performed both before and after the administration of intravenous contrast. CONTRAST:  23mL MULTIHANCE GADOBENATE DIMEGLUMINE 529 MG/ML IV SOLN COMPARISON:  07/02/2017 FINDINGS: Lower chest: Small left pleural effusion identified. Nodules within the lingula are again noted. Better characterized on recent chest CT. Hepatobiliary: Hypertrophy of the lateral segment of left lobe of liver and caudate lobe of liver identified. Previous segment 8 liver lesion demonstrates interval response to therapy. Currently this measures 1.4 cm. Compared with 12/29/2016 when this measured 2.7 cm and 2.1 cm on 04/13/2017. The lesion within the lateral segment of left lobe of liver measures 3.4 x 4.1 cm. This is compared with 3.6 x 4.9 cm on 12/29/2016. However, there is progressive thrombosis involving the left portal vein when compared with 04/13/2017. Additionally, there is continued thrombosis of the left hepatic vein and its branches. There is a  new lesion within segment 7 measuring 1.2 cm, image 24 of series 17. Pancreas:  The gallbladder is unremarkable.  No biliary dilatation. Spleen: Chronic posttraumatic appearance of the spleen without suspicious splenic lesion. Adrenals/Urinary Tract: The adrenal glands appear normal. Unremarkable appearance of the kidneys. Stomach/Bowel: Visualized portions within the abdomen are unremarkable. Vascular/Lymphatic: Aortic atherosclerosis. No aneurysm. Small varicosities are noted within the left upper quadrant of the abdomen. Prominent gastrohepatic ligament lymph nodes are identified which measure up to 1 cm. Other:  No free fluid or fluid collections identified. Musculoskeletal: No suspicious bone lesions identified. IMPRESSION: 1. Persistent mass within lateral segment of left lobe of liver is identified with progressive thrombosis of the left portal vein. There is also persistent thrombosis of the left hepatic vein. Portal vein thrombus extends up to the level of the main portal vein and is likely tumor thrombus. The liver has morphologic features suggestive of early cirrhosis. Additionally, the patient is positive for hepatitis-C and hepatitis-B and has an elevated AFP. All findings are highly suggestive that the lesion in the left lobe of liver represents hepatoma rather than lung cancer metastasis. 2. Lesion within  segment 8 of the liver has decreased in size when compared with 04/13/2017. 3. Small lesion within segment 7 of the liver appears new from previous exam. Indeterminate. This may represent an area of lung cancer metastasis or possibly multifocal HCC. These results were called by telephone at the time of interpretation on 07/05/2017 at 12:39 pm to Dr. Nolon Stalls , who verbally acknowledged these results. Electronically Signed   By: Kerby Moors M.D.   On: 07/05/2017 12:41   Ct Abdomen Pelvis W Contrast  Result Date: 07/02/2017 CLINICAL DATA:  Patient with history of metastatic right upper  lobe carcinoma. History of chemotherapy and radiation. Restaging exam. EXAM: CT CHEST, ABDOMEN, AND PELVIS WITH CONTRAST TECHNIQUE: Multidetector CT imaging of the chest, abdomen and pelvis was performed following the standard protocol during bolus administration of intravenous contrast. CONTRAST:  59mL ISOVUE-300 IOPAMIDOL (ISOVUE-300) INJECTION 61% COMPARISON:  CT CAP 04/13/2017 FINDINGS: CT CHEST FINDINGS Cardiovascular: Rightward shift of the mediastinum. Normal heart size. No pericardial effusion. Aorta and main pulmonary artery are normal in caliber. Mediastinum/Nodes: No enlarged axillary, mediastinal or hilar lymphadenopathy. Normal esophagus. Lungs/Pleura: Central airways are patent. Stable appearing right paramediastinal and right perihilar opacities most compatible with radiation changes. Unchanged scarring within the left lung apex. Similar-appearing 1.6 cm right perihilar mass (image 24; series 2). Unchanged trace loculated right pleural effusion (image 26; series 2). Interval development of consolidative opacities within the lingula (image 5-7; series 5). No pneumothorax. Musculoskeletal: No aggressive or acute appearing osseous lesions. CT ABDOMEN PELVIS FINDINGS Hepatobiliary: Progression of hepatic vein thrombosis within the left hepatic lobe with increased heterogeneity of the left hepatic lobe enhancement, limiting evaluation of known left hepatic lobe lesion. Overall there is increased heterogeneity throughout the left hepatic lobe when compared to recent prior. Additionally there is suggestion of a possible new lesion within the left hepatic lobe measuring 1.5 cm (image 55; series 2). Slight interval decrease in size of triangular-shaped low-attenuation lesion within segment 8 measuring 1.7 x 1.2 cm (image 52; series 2), previously 1.5 x 2.1 cm. Re- demonstrated transient hepatic attenuation within segment 6 (image 60; series 2). Similar-appearing lesion within segment 3 of the liver measuring  1.6 x 1.2 cm (image 69; series 2), previously 1.6 x 1.3 cm. Pancreas: Unremarkable Spleen: Lobular in contour, unchanged. Adrenals/Urinary Tract: Normal adrenal glands. Unchanged scarring medial left kidney, interpolar region. No hydronephrosis. Urinary bladder is unremarkable. Stomach/Bowel: No abnormal bowel wall thickening or evidence for bowel obstruction. No free fluid or free intraperitoneal air. Normal morphology of the stomach. Vascular/Lymphatic: Peripheral calcified atherosclerotic plaque involving the abdominal aorta. Stable subcentimeter retroperitoneal lymph nodes. This includes an unchanged 8 mm gastrohepatic lymph node (image 58; series 2). Reproductive: Unremarkable. Other: None. Musculoskeletal: No aggressive or acute appearing osseous lesions. IMPRESSION: Interval increase in left hepatic vein thrombosis with progressive heterogeneity in enhancement of the left hepatic lobe, predominately obscuring the known left hepatic lobe lesion. Possible new 1.5 cm lateral left hepatic lobe lesion versus focal area of heterogeneity. Slight interval decrease in size of irregular low-attenuation lesion within segment 8 of the liver. Stable appearance of the right lung. Interval development of consolidation within the lingula which may represent atelectasis or infection. Recommend attention on follow-up as underlying lesion is not entirely excluded. Aortic atherosclerosis. Electronically Signed   By: Lovey Newcomer M.D.   On: 07/02/2017 14:42   US Biopsy  Result Date: 07/18/2017 INDICATION: History of lung cancer and hepatitis-C, now with ill-defined lesion/mass within the left lobe of the liver  and associated malignant occlusion of the left portal and hepatic veins. Please perform ultrasound-guided liver lesion biopsy for tissue diagnostic purposes. EXAM: ULTRASOUND GUIDED LIVER LESION BIOPSY COMPARISON:  CT chest, abdomen and pelvis - 07/02/2017; abdominal MRI - 07/05/2017 MEDICATIONS: None ANESTHESIA/SEDATION:  Fentanyl 50 mcg IV; Versed 2 mg IV Total Moderate Sedation time: 15 minutes. The patient's level of consciousness and vital signs were monitored continuously by radiology nursing throughout the procedure under my direct supervision. COMPLICATIONS: None immediate. PROCEDURE: Informed written consent was obtained from the patient after a discussion of the risks, benefits and alternatives to treatment. The patient understands and consents the procedure. A timeout was performed prior to the initiation of the procedure. Ultrasound scanning was performed of the right upper abdominal quadrant demonstrates an ill-defined mixed echogenic mass within the central aspect of the left lobe of the liver measuring approximately 3.5 x 3.6 cm correlating with the ill-defined lesion seen on preceding abdominal CT image 56, series 2). The procedure was planned. The midline of the upper abdomen was prepped and draped in the usual sterile fashion. The overlying soft tissues were anesthetized with 1% lidocaine with epinephrine. A 17 gauge, 6.8 cm co-axial needle was advanced into a peripheral aspect of the lesion. This was followed by 5 core biopsies with an 18 gauge core device under direct ultrasound guidance. The coaxial needle tract was embolized with a small amount of Gel-Foam slurry and superficial hemostasis was obtained with manual compression. Post procedural scanning was negative for definitive area of hemorrhage or additional complication. A dressing was placed. The patient tolerated the procedure well without immediate post procedural complication. IMPRESSION: Technically successful ultrasound guided core needle biopsy of ill-defined infiltrative lesion within the left lobe of the liver. Electronically Signed   By: Sandi Mariscal M.D.   On: 07/18/2017 13:16    Labs:  CBC:  Recent Labs  06/26/17 0900 07/03/17 0830 07/18/17 1028 07/24/17 0744  WBC 9.5 7.4 7.8 9.8  HGB 13.2 13.5 13.4 14.5  HCT 37.8* 39.3* 40.1 43.1    PLT 242 216 165 217    COAGS:  Recent Labs  04/13/17 1350 07/18/17 1028  INR 1.10 1.19  APTT 32 36    BMP:  Recent Labs  06/05/17 0845 06/26/17 0900 07/03/17 0830 07/24/17 0744  NA 136 136 137 139  K 4.5 4.2 4.1 3.6  CL 102 104 104 102  CO2 27 28 28 27   GLUCOSE 110* 122* 133* 94  BUN 15 20 18  22*  CALCIUM 9.1 9.3 9.2 9.3  CREATININE 0.95 0.78 0.80 0.75  GFRNONAA >60 >60 >60 >60  GFRAA >60 >60 >60 >60    LIVER FUNCTION TESTS:  Recent Labs  06/05/17 0845 06/26/17 0900 07/03/17 0830 07/24/17 0744  BILITOT 0.9 0.6 0.7 0.6  AST 87* 143* 108* 81*  ALT 65* 116* 93* 75*  ALKPHOS 76 94 82 77  PROT 8.6* 8.4* 8.1 9.1*  ALBUMIN 3.6 3.4* 3.4* 3.9    TUMOR MARKERS:  Recent Labs  11/22/16 1300 02/13/17 1322 03/06/17 0920  CEA 2.8 2.9 3.0    Assessment and Plan: Liver lesion c/w [place] for visceral angio with possible coil embo and test dosing radioisotope. Labs reviewed. Risks and benefits discussed with the patient including, but not limited to bleeding, infection, vascular injury or contrast induced renal failure. All of the patient's questions were answered, patient is agreeable to proceed. Consent signed and in chart.   Thank you for this interesting consult.  I greatly enjoyed  meeting Harriet Lee Gilbert and look forward to participating in their care.  A copy of this report was sent to the requesting provider on this date.  Electronically Signed: Ascencion Dike, PA-C 07/24/2017, 9:00 AM   I spent a total of 20 minutes in face to face in clinical consultation, greater than 50% of which was counseling/coordinating care for Pre Y-90 visceral angiogram

## 2017-07-24 NOTE — Progress Notes (Signed)
Patient slowly raised up in bed to sitting position and sat at side of bed with RN. Tolerated well. Ambulated to restroom with RN and able to void. Patient ambulating in room and out into hallway with no difficulty. Groin site unremarkable. VSS. No c/o pain or feeling different in any way. All d/c instructions reviewed with patient and daughter. Aware to cal 911 for any emergencies or their MD with any (non emergent) questions or concerns. Escorted to car via w/c at 1635.

## 2017-07-24 NOTE — Progress Notes (Signed)
Ascencion Dike PA here to see patient (aware of EKG which stated NSR). Prior EKGs on file in [facility] from last year show PAC and PVC. Patient states feels fine. All VS remain constant other than HR dropping down as low as 29 for a few seconds and then back up to 80s. Daughter at bedside. Groin site post Y90 unremarkable. Good pedal pulses and CMS to right foot. Drinking ginger ale and eating a few crackers. Continue to monitor closely. Per Lennette Bihari prior to d/c to home (order for d/c at 1630 as well as stable) have patient sit up and ambulate in halls due to bedrest to make sure is stable for d/c home.

## 2017-07-25 ENCOUNTER — Other Ambulatory Visit: Payer: Self-pay | Admitting: Hematology and Oncology

## 2017-07-25 ENCOUNTER — Encounter (HOSPITAL_COMMUNITY): Payer: Self-pay | Admitting: Interventional Radiology

## 2017-07-25 ENCOUNTER — Inpatient Hospital Stay: Payer: Medicare Other | Attending: Hematology and Oncology | Admitting: Hematology and Oncology

## 2017-07-25 ENCOUNTER — Ambulatory Visit: Payer: No Typology Code available for payment source

## 2017-07-25 VITALS — BP 100/70 | HR 97 | Temp 97.8°F | Resp 20 | Wt 131.4 lb

## 2017-07-25 DIAGNOSIS — I998 Other disorder of circulatory system: Secondary | ICD-10-CM | POA: Diagnosis not present

## 2017-07-25 DIAGNOSIS — Z77090 Contact with and (suspected) exposure to asbestos: Secondary | ICD-10-CM | POA: Insufficient documentation

## 2017-07-25 DIAGNOSIS — Z8619 Personal history of other infectious and parasitic diseases: Secondary | ICD-10-CM | POA: Insufficient documentation

## 2017-07-25 DIAGNOSIS — Z809 Family history of malignant neoplasm, unspecified: Secondary | ICD-10-CM | POA: Insufficient documentation

## 2017-07-25 DIAGNOSIS — Z808 Family history of malignant neoplasm of other organs or systems: Secondary | ICD-10-CM | POA: Diagnosis not present

## 2017-07-25 DIAGNOSIS — Z87891 Personal history of nicotine dependence: Secondary | ICD-10-CM | POA: Diagnosis not present

## 2017-07-25 DIAGNOSIS — Z7901 Long term (current) use of anticoagulants: Secondary | ICD-10-CM | POA: Diagnosis not present

## 2017-07-25 DIAGNOSIS — Z86718 Personal history of other venous thrombosis and embolism: Secondary | ICD-10-CM | POA: Diagnosis not present

## 2017-07-25 DIAGNOSIS — C787 Secondary malignant neoplasm of liver and intrahepatic bile duct: Secondary | ICD-10-CM

## 2017-07-25 DIAGNOSIS — R948 Abnormal results of function studies of other organs and systems: Secondary | ICD-10-CM | POA: Diagnosis not present

## 2017-07-25 DIAGNOSIS — R0602 Shortness of breath: Secondary | ICD-10-CM | POA: Diagnosis not present

## 2017-07-25 DIAGNOSIS — I81 Portal vein thrombosis: Secondary | ICD-10-CM | POA: Diagnosis not present

## 2017-07-25 DIAGNOSIS — Z8701 Personal history of pneumonia (recurrent): Secondary | ICD-10-CM | POA: Insufficient documentation

## 2017-07-25 DIAGNOSIS — M069 Rheumatoid arthritis, unspecified: Secondary | ICD-10-CM

## 2017-07-25 DIAGNOSIS — Z79899 Other long term (current) drug therapy: Secondary | ICD-10-CM

## 2017-07-25 DIAGNOSIS — Z66 Do not resuscitate: Secondary | ICD-10-CM | POA: Insufficient documentation

## 2017-07-25 DIAGNOSIS — C3411 Malignant neoplasm of upper lobe, right bronchus or lung: Secondary | ICD-10-CM | POA: Diagnosis not present

## 2017-07-25 DIAGNOSIS — C7951 Secondary malignant neoplasm of bone: Secondary | ICD-10-CM | POA: Insufficient documentation

## 2017-07-25 DIAGNOSIS — R1011 Right upper quadrant pain: Secondary | ICD-10-CM | POA: Diagnosis not present

## 2017-07-25 DIAGNOSIS — B1920 Unspecified viral hepatitis C without hepatic coma: Secondary | ICD-10-CM

## 2017-07-25 DIAGNOSIS — Z801 Family history of malignant neoplasm of trachea, bronchus and lung: Secondary | ICD-10-CM | POA: Insufficient documentation

## 2017-07-25 DIAGNOSIS — G893 Neoplasm related pain (acute) (chronic): Secondary | ICD-10-CM

## 2017-07-25 DIAGNOSIS — I820 Budd-Chiari syndrome: Secondary | ICD-10-CM

## 2017-07-25 MED ORDER — OXYCODONE HCL 5 MG PO TABS: ORAL_TABLET | ORAL | 0 refills | Status: DC

## 2017-07-25 NOTE — Progress Notes (Signed)
Patient had procedure yesterday.  States he is having discomfort today but no real pain.  States he is taking Xarelto.  If he is to continue, will need a refill.  Also asking for pain medication that is a little stronger.  Oxycodone 5 mg is no longer covering his pain.  Patiient here today for results.

## 2017-07-26 ENCOUNTER — Encounter: Payer: Self-pay | Admitting: Gastroenterology

## 2017-07-26 LAB — SURGICAL PATHOLOGY

## 2017-07-30 ENCOUNTER — Ambulatory Visit: Payer: No Typology Code available for payment source | Admitting: Hematology and Oncology

## 2017-07-31 ENCOUNTER — Inpatient Hospital Stay: Payer: Medicare Other

## 2017-07-31 DIAGNOSIS — C7951 Secondary malignant neoplasm of bone: Secondary | ICD-10-CM

## 2017-07-31 DIAGNOSIS — C787 Secondary malignant neoplasm of liver and intrahepatic bile duct: Secondary | ICD-10-CM

## 2017-07-31 DIAGNOSIS — R948 Abnormal results of function studies of other organs and systems: Secondary | ICD-10-CM | POA: Diagnosis not present

## 2017-07-31 DIAGNOSIS — I81 Portal vein thrombosis: Secondary | ICD-10-CM

## 2017-07-31 DIAGNOSIS — I820 Budd-Chiari syndrome: Secondary | ICD-10-CM

## 2017-07-31 DIAGNOSIS — B1920 Unspecified viral hepatitis C without hepatic coma: Secondary | ICD-10-CM

## 2017-07-31 DIAGNOSIS — G893 Neoplasm related pain (acute) (chronic): Secondary | ICD-10-CM

## 2017-07-31 DIAGNOSIS — Z79899 Other long term (current) drug therapy: Secondary | ICD-10-CM | POA: Diagnosis not present

## 2017-07-31 DIAGNOSIS — C3411 Malignant neoplasm of upper lobe, right bronchus or lung: Secondary | ICD-10-CM | POA: Diagnosis not present

## 2017-07-31 DIAGNOSIS — R1011 Right upper quadrant pain: Secondary | ICD-10-CM | POA: Diagnosis not present

## 2017-07-31 LAB — BASIC METABOLIC PANEL WITH GFR
Anion gap: 4 — ABNORMAL LOW (ref 5–15)
BUN: 14 mg/dL (ref 6–20)
CO2: 29 mmol/L (ref 22–32)
Calcium: 9.1 mg/dL (ref 8.9–10.3)
Chloride: 107 mmol/L (ref 101–111)
Creatinine, Ser: 0.78 mg/dL (ref 0.61–1.24)
GFR calc Af Amer: >60 mL/min
GFR calc non Af Amer: >60 mL/min
Glucose, Bld: 96 mg/dL (ref 65–99)
Potassium: 4.6 mmol/L (ref 3.5–5.1)
Sodium: 140 mmol/L (ref 135–145)

## 2017-07-31 MED ORDER — DENOSUMAB 120 MG/1.7ML ~~LOC~~ SOLN
120.0000 mg | Freq: Once | SUBCUTANEOUS | Status: AC
  Administered 2017-07-31: 120 mg via SUBCUTANEOUS
  Filled 2017-07-31: qty 1.7

## 2017-08-07 ENCOUNTER — Other Ambulatory Visit: Payer: Self-pay | Admitting: Student

## 2017-08-08 ENCOUNTER — Ambulatory Visit (HOSPITAL_COMMUNITY): Payer: Medicare Other

## 2017-08-08 ENCOUNTER — Ambulatory Visit (HOSPITAL_COMMUNITY): Admission: RE | Admit: 2017-08-08 | Payer: Medicare Other | Source: Ambulatory Visit

## 2017-08-08 ENCOUNTER — Other Ambulatory Visit (HOSPITAL_COMMUNITY): Payer: Self-pay | Admitting: Interventional Radiology

## 2017-08-08 ENCOUNTER — Encounter (HOSPITAL_COMMUNITY): Payer: Self-pay

## 2017-08-08 ENCOUNTER — Ambulatory Visit (HOSPITAL_COMMUNITY)
Admission: RE | Admit: 2017-08-08 | Discharge: 2017-08-08 | Disposition: A | Discharge status: Home / Self Care | Payer: Medicare Other | Source: Ambulatory Visit | Attending: Interventional Radiology | Admitting: Interventional Radiology

## 2017-08-08 DIAGNOSIS — R772 Abnormality of alphafetoprotein: Secondary | ICD-10-CM

## 2017-08-08 DIAGNOSIS — C3411 Malignant neoplasm of upper lobe, right bronchus or lung: Secondary | ICD-10-CM

## 2017-08-08 DIAGNOSIS — C787 Secondary malignant neoplasm of liver and intrahepatic bile duct: Secondary | ICD-10-CM

## 2017-08-08 DIAGNOSIS — R160 Hepatomegaly, not elsewhere classified: Secondary | ICD-10-CM

## 2017-08-16 ENCOUNTER — Other Ambulatory Visit: Payer: Self-pay | Admitting: Radiology

## 2017-08-16 ENCOUNTER — Other Ambulatory Visit: Payer: No Typology Code available for payment source

## 2017-08-16 ENCOUNTER — Ambulatory Visit: Payer: No Typology Code available for payment source | Admitting: Hematology and Oncology

## 2017-08-16 ENCOUNTER — Other Ambulatory Visit: Payer: Self-pay | Admitting: General Surgery

## 2017-08-16 MED ORDER — DOXORUBICIN HCL CHEMO IV INJECTION 2 MG/ML
75.0000 mg | Freq: Once | INTRAVENOUS | Status: AC
  Administered 2017-08-20: 76 mg via INTRA_ARTERIAL
  Filled 2017-08-16: qty 38

## 2017-08-17 ENCOUNTER — Other Ambulatory Visit: Payer: Self-pay | Admitting: Radiology

## 2017-08-20 ENCOUNTER — Ambulatory Visit (HOSPITAL_COMMUNITY)
Admission: RE | Admit: 2017-08-20 | Discharge: 2017-08-20 | Disposition: A | Discharge status: Home / Self Care | Payer: Medicare Other | Source: Ambulatory Visit | Attending: Interventional Radiology | Admitting: Interventional Radiology

## 2017-08-20 ENCOUNTER — Encounter (HOSPITAL_COMMUNITY): Payer: Self-pay

## 2017-08-20 ENCOUNTER — Inpatient Hospital Stay (HOSPITAL_COMMUNITY)
Admission: RE | Admit: 2017-08-20 | Discharge: 2017-08-22 | DRG: 435 | Disposition: A | Discharge status: Home / Self Care | Payer: Medicare Other | Source: Ambulatory Visit | Attending: Interventional Radiology | Admitting: Interventional Radiology

## 2017-08-20 DIAGNOSIS — C3490 Malignant neoplasm of unspecified part of unspecified bronchus or lung: Secondary | ICD-10-CM | POA: Diagnosis not present

## 2017-08-20 DIAGNOSIS — C3491 Malignant neoplasm of unspecified part of right bronchus or lung: Secondary | ICD-10-CM | POA: Diagnosis present

## 2017-08-20 DIAGNOSIS — I820 Budd-Chiari syndrome: Secondary | ICD-10-CM | POA: Diagnosis present

## 2017-08-20 DIAGNOSIS — C3492 Malignant neoplasm of unspecified part of left bronchus or lung: Secondary | ICD-10-CM | POA: Diagnosis present

## 2017-08-20 DIAGNOSIS — B1910 Unspecified viral hepatitis B without hepatic coma: Secondary | ICD-10-CM | POA: Diagnosis present

## 2017-08-20 DIAGNOSIS — K7460 Unspecified cirrhosis of liver: Secondary | ICD-10-CM | POA: Diagnosis present

## 2017-08-20 DIAGNOSIS — R160 Hepatomegaly, not elsewhere classified: Secondary | ICD-10-CM

## 2017-08-20 DIAGNOSIS — I81 Portal vein thrombosis: Secondary | ICD-10-CM | POA: Diagnosis present

## 2017-08-20 DIAGNOSIS — Z7901 Long term (current) use of anticoagulants: Secondary | ICD-10-CM

## 2017-08-20 DIAGNOSIS — B1920 Unspecified viral hepatitis C without hepatic coma: Secondary | ICD-10-CM | POA: Diagnosis present

## 2017-08-20 DIAGNOSIS — C220 Liver cell carcinoma: Secondary | ICD-10-CM | POA: Diagnosis not present

## 2017-08-20 DIAGNOSIS — M069 Rheumatoid arthritis, unspecified: Secondary | ICD-10-CM | POA: Diagnosis present

## 2017-08-20 HISTORY — PX: IR EMBO TUMOR ORGAN ISCHEMIA INFARCT INC GUIDE ROADMAPPING: IMG5449

## 2017-08-20 HISTORY — PX: IR ANGIOGRAM VISCERAL SELECTIVE: IMG657

## 2017-08-20 HISTORY — PX: IR ANGIOGRAM SELECTIVE EACH ADDITIONAL VESSEL: IMG667

## 2017-08-20 LAB — COMPREHENSIVE METABOLIC PANEL
ALT: 48 U/L (ref 17–63)
ANION GAP: 7 (ref 5–15)
AST: 66 U/L — ABNORMAL HIGH (ref 15–41)
Albumin: 3.3 g/dL — ABNORMAL LOW (ref 3.5–5.0)
Alkaline Phosphatase: 92 U/L (ref 38–126)
BILIRUBIN TOTAL: 0.7 mg/dL (ref 0.3–1.2)
BUN: 15 mg/dL (ref 6–20)
CALCIUM: 8.7 mg/dL — AB (ref 8.9–10.3)
CO2: 26 mmol/L (ref 22–32)
Chloride: 106 mmol/L (ref 101–111)
Creatinine, Ser: 0.68 mg/dL (ref 0.61–1.24)
GLUCOSE: 99 mg/dL (ref 65–99)
Potassium: 4.4 mmol/L (ref 3.5–5.1)
Sodium: 139 mmol/L (ref 135–145)
TOTAL PROTEIN: 8.1 g/dL (ref 6.5–8.1)

## 2017-08-20 LAB — CBC
HCT: 37.2 % — ABNORMAL LOW (ref 39.0–52.0)
Hemoglobin: 12.3 g/dL — ABNORMAL LOW (ref 13.0–17.0)
MCH: 32.4 pg (ref 26.0–34.0)
MCHC: 33.1 g/dL (ref 30.0–36.0)
MCV: 97.9 fL (ref 78.0–100.0)
PLATELETS: 123 10*3/uL — AB (ref 150–400)
RBC: 3.8 MIL/uL — ABNORMAL LOW (ref 4.22–5.81)
RDW: 14.3 % (ref 11.5–15.5)
WBC: 7.1 10*3/uL (ref 4.0–10.5)

## 2017-08-20 LAB — APTT: aPTT: 27 seconds (ref 24–36)

## 2017-08-20 LAB — PROTIME-INR
INR: 1.06
Prothrombin Time: 13.7 seconds (ref 11.4–15.2)

## 2017-08-20 MED ORDER — PROMETHAZINE HCL 25 MG PO TABS
25.0000 mg | ORAL_TABLET | Freq: Three times a day (TID) | ORAL | Status: DC | PRN
  Administered 2017-08-20 – 2017-08-21 (×2): 25 mg via ORAL
  Filled 2017-08-20 (×2): qty 1

## 2017-08-20 MED ORDER — SODIUM CHLORIDE 0.9% FLUSH: 3.0000 mL | INTRAVENOUS | Status: DC | PRN

## 2017-08-20 MED ORDER — SODIUM CHLORIDE 0.9 % IV SOLN
INTRAVENOUS | Status: DC
  Administered 2017-08-21: 19:00:00 via INTRAVENOUS

## 2017-08-20 MED ORDER — LIDOCAINE HCL 1 % IJ SOLN: INTRAMUSCULAR | Status: AC | PRN

## 2017-08-20 MED ORDER — LIDOCAINE HCL 2 % IJ SOLN
INTRAMUSCULAR | Status: AC | PRN
  Administered 2017-08-20: 5 mL via INTRADERMAL

## 2017-08-20 MED ORDER — FENTANYL CITRATE (PF) 100 MCG/2ML IJ SOLN
INTRAMUSCULAR | Status: AC | PRN
  Administered 2017-08-20 (×2): 50 ug via INTRAVENOUS

## 2017-08-20 MED ORDER — KETOROLAC TROMETHAMINE 30 MG/ML IJ SOLN
15.0000 mg | Freq: Once | INTRAMUSCULAR | Status: AC
  Administered 2017-08-20: 30 mg via INTRAVENOUS

## 2017-08-20 MED ORDER — SODIUM CHLORIDE 0.9 % IV SOLN
INTRAVENOUS | Status: DC
  Administered 2017-08-20: 12:00:00 via INTRAVENOUS

## 2017-08-20 MED ORDER — ONDANSETRON HCL 4 MG/2ML IJ SOLN
4.0000 mg | Freq: Four times a day (QID) | INTRAMUSCULAR | Status: DC | PRN
  Administered 2017-08-20 – 2017-08-21 (×3): 4 mg via INTRAVENOUS
  Filled 2017-08-20 (×3): qty 2

## 2017-08-20 MED ORDER — PROMETHAZINE HCL 25 MG RE SUPP
25.0000 mg | Freq: Three times a day (TID) | RECTAL | Status: DC | PRN
  Administered 2017-08-21: 25 mg via RECTAL
  Filled 2017-08-20: qty 1

## 2017-08-20 MED ORDER — KETOROLAC TROMETHAMINE 30 MG/ML IJ SOLN
INTRAMUSCULAR | Status: AC
  Administered 2017-08-20: 30 mg via INTRAVENOUS
  Filled 2017-08-20: qty 1

## 2017-08-20 MED ORDER — SODIUM CHLORIDE 0.9% FLUSH
3.0000 mL | Freq: Two times a day (BID) | INTRAVENOUS | Status: DC
  Administered 2017-08-21: 3 mL via INTRAVENOUS

## 2017-08-20 MED ORDER — SODIUM CHLORIDE 0.9 % IV SOLN: 250.0000 mL | INTRAVENOUS | Status: DC | PRN

## 2017-08-20 MED ORDER — FENTANYL CITRATE (PF) 100 MCG/2ML IJ SOLN
INTRAMUSCULAR | Status: AC
  Filled 2017-08-20: qty 4

## 2017-08-20 MED ORDER — IOPAMIDOL (ISOVUE-300) INJECTION 61%: 100.0000 mL | Freq: Once | INTRAVENOUS | Status: DC | PRN

## 2017-08-20 MED ORDER — MIDAZOLAM HCL 2 MG/2ML IJ SOLN
INTRAMUSCULAR | Status: AC | PRN
  Administered 2017-08-20 (×2): 1 mg via INTRAVENOUS

## 2017-08-20 MED ORDER — IOPAMIDOL (ISOVUE-300) INJECTION 61%
100.0000 mL | Freq: Once | INTRAVENOUS | Status: AC | PRN
  Administered 2017-08-20: 20 mL via INTRA_ARTERIAL

## 2017-08-20 MED ORDER — IOPAMIDOL (ISOVUE-300) INJECTION 61%
100.0000 mL | Freq: Once | INTRAVENOUS | Status: AC | PRN
Start: 2017-08-20 — End: 2017-08-20
  Administered 2017-08-20: 30 mL via INTRA_ARTERIAL

## 2017-08-20 MED ORDER — SODIUM CHLORIDE 0.9 % IV SOLN
8.0000 mg | Freq: Once | INTRAVENOUS | Status: AC
  Administered 2017-08-20: 8 mg via INTRAVENOUS
  Filled 2017-08-20: qty 4

## 2017-08-20 MED ORDER — PANTOPRAZOLE SODIUM 40 MG IV SOLR
40.0000 mg | Freq: Once | INTRAVENOUS | Status: AC
  Administered 2017-08-20: 40 mg via INTRAVENOUS
  Filled 2017-08-20: qty 40

## 2017-08-20 MED ORDER — MIDAZOLAM HCL 2 MG/2ML IJ SOLN
INTRAMUSCULAR | Status: AC
  Filled 2017-08-20: qty 4

## 2017-08-20 MED ORDER — PIPERACILLIN-TAZOBACTAM 3.375 G IVPB 30 MIN
3.3750 g | Freq: Once | INTRAVENOUS | Status: AC
  Administered 2017-08-20: 3.375 g via INTRAVENOUS
  Filled 2017-08-20: qty 50

## 2017-08-20 MED ORDER — LIDOCAINE HCL (PF) 2 % IJ SOLN
INTRAMUSCULAR | Status: AC
  Filled 2017-08-20: qty 10

## 2017-08-20 MED ORDER — IOPAMIDOL (ISOVUE-300) INJECTION 61%
INTRAVENOUS | Status: AC
  Administered 2017-08-20: 30 mL via INTRA_ARTERIAL
  Filled 2017-08-20: qty 300

## 2017-08-20 MED ORDER — DOCUSATE SODIUM 100 MG PO CAPS
100.0000 mg | ORAL_CAPSULE | Freq: Two times a day (BID) | ORAL | Status: DC
  Administered 2017-08-21 – 2017-08-22 (×3): 100 mg via ORAL
  Filled 2017-08-20 (×3): qty 1

## 2017-08-20 MED ORDER — ONDANSETRON HCL 4 MG/2ML IJ SOLN
4.0000 mg | Freq: Once | INTRAMUSCULAR | Status: AC
  Administered 2017-08-21: 4 mg via INTRAVENOUS
  Filled 2017-08-20: qty 2

## 2017-08-20 MED ORDER — CALCIUM CARBONATE-VITAMIN D 500-200 MG-UNIT PO TABS
1.0000 | ORAL_TABLET | Freq: Two times a day (BID) | ORAL | Status: DC
  Administered 2017-08-21 – 2017-08-22 (×3): 1 via ORAL
  Filled 2017-08-20 (×3): qty 1

## 2017-08-20 MED ORDER — HYDROCODONE-ACETAMINOPHEN 5-325 MG PO TABS
1.0000 | ORAL_TABLET | ORAL | Status: DC | PRN
  Administered 2017-08-20: 1 via ORAL
  Filled 2017-08-20: qty 1

## 2017-08-20 MED ORDER — KETOROLAC TROMETHAMINE 15 MG/ML IJ SOLN
15.0000 mg | Freq: Four times a day (QID) | INTRAMUSCULAR | Status: DC
  Administered 2017-08-20 – 2017-08-21 (×3): 15 mg via INTRAVENOUS
  Filled 2017-08-20 (×3): qty 1

## 2017-08-20 MED ORDER — FOLIC ACID 1 MG PO TABS
1.0000 mg | ORAL_TABLET | Freq: Every day | ORAL | Status: DC
  Administered 2017-08-21 – 2017-08-22 (×2): 1 mg via ORAL
  Filled 2017-08-20 (×2): qty 1

## 2017-08-20 NOTE — H&P (Signed)
Referring Physician(s): Corcoran,M  Supervising Physician: Aletta Edouard  Patient Status:  [facility] TBA  Chief Complaint: Lung cancer, hepatocellular carcinoma   Subjective: Patient familiar to IR service from prior bone marrow biopsy in 2017, liver lesion biopsy on 07/18/17 and pre-hepatic Y 90 arterial roadmapping on 07/24/17. He has known history of metastatic right lung adenocarcinoma, hepatitis C, hepatitis B, cirrhosis by imaging and recently diagnosed left hepatocellular carcinoma with associated left hepatic and portal vein tumor thrombosis. He has significant lung shunting of 31% on recent test Y 90 dosing which prevented further Y-90 treatment. He presents again today for hepatic DEB-TACE to treat the left lobe hepatocellular carcinoma. He currently denies fever, chest pain, nausea, vomiting or abnormal bleeding. He does have occasional headaches, some dyspnea with exertion, occasional abdominal pain.  Past Medical History:  Diagnosis Date  . Arthritis    Rheumatoid arthritis  . Cancer ([place])    lung ca   . Collagen vascular disease ([facility])    RA  . Dyspnea   . Hepatitis    b and c  . Mass of lung   . Pneumonia   . Pneumothorax    spontaneous  . Renal disorder    as a child   Past Surgical History:  Procedure Laterality Date  . FLEXIBLE BRONCHOSCOPY N/A 03/30/2016   Procedure: FLEXIBLE BRONCHOSCOPY;  Surgeon: Vilinda Boehringer, MD;  Location: [facility] ORS;  Service: Cardiopulmonary;  Laterality: N/A;  . IR ANGIOGRAM SELECTIVE EACH ADDITIONAL VESSEL  07/24/2017  . IR ANGIOGRAM SELECTIVE EACH ADDITIONAL VESSEL  07/24/2017  . IR ANGIOGRAM SELECTIVE EACH ADDITIONAL VESSEL  07/24/2017  . IR ANGIOGRAM SELECTIVE EACH ADDITIONAL VESSEL  07/24/2017  . IR ANGIOGRAM SELECTIVE EACH ADDITIONAL VESSEL  07/24/2017  . IR ANGIOGRAM SELECTIVE EACH ADDITIONAL VESSEL  07/24/2017  . IR ANGIOGRAM SELECTIVE EACH ADDITIONAL VESSEL  07/24/2017  . IR ANGIOGRAM VISCERAL SELECTIVE  07/24/2017  . IR EMBO  ARTERIAL NOT HEMORR HEMANG INC GUIDE ROADMAPPING  07/24/2017  . IR US GUIDE VASC ACCESS RIGHT  07/24/2017  . Ureter repair as a child       Allergies: Nsaids and Sulfa antibiotics  Medications: Prior to Admission medications   Medication Sig Start Date End Date Taking? Authorizing Provider  calcium-vitamin D (OSCAL WITH D) 500-200 MG-UNIT tablet Take 1 tablet by mouth 2 (two) times daily. 09/20/16 06/05/17  Creola Corn, MD  enoxaparin (LOVENOX) 60 MG/0.6ML injection Inject 0.6 mLs (60 mg total) into the skin every 12 (twelve) hours. Patient not taking: Reported on 07/18/2017 07/09/17   Lequita Asal, MD  folic acid (FOLVITE) 1 MG tablet Take 1 tablet (1 mg total) by mouth daily. 11/16/16   Lequita Asal, MD  gabapentin (NEURONTIN) 300 MG capsule Take 1 capsule (300 mg total) by mouth at bedtime. 11/07/16   Lequita Asal, MD  HYDROcodone-acetaminophen (NORCO) 7.5-325 MG tablet Take 1 tablet by mouth every 4 (four) hours as needed for moderate pain (Every 4-6 hours as needed for pain). Patient not taking: Reported on 07/18/2017 07/03/16   Lequita Asal, MD  ondansetron (ZOFRAN) 8 MG tablet Take 1 tablet (8 mg total) by mouth 2 (two) times daily as needed for nausea or vomiting. 04/17/16   Lequita Asal, MD  oxyCODONE (OXY IR/ROXICODONE) 5 MG immediate release tablet Take 1-2 tablets every 4 hours as needed for pain 07/25/17   Karen Kitchens, NP  oxyCODONE-acetaminophen (PERCOCET/ROXICET) 5-325 MG tablet Take 1 tablet by mouth every 6 (six) hours as  needed for severe pain. Patient not taking: Reported on 07/18/2017 03/27/17   Lequita Asal, MD  rivaroxaban (XARELTO) 20 MG TABS tablet Take 1 tablet (20 mg total) by mouth daily with supper. 05/11/17   Lequita Asal, MD  sucralfate (CARAFATE) 1 g tablet Take 1 tablet (1 g total) by mouth 3 (three) times daily. Dissolve in 2-3 tbsp warm water, swish and swallow. Patient not taking: Reported on 07/18/2017 05/02/16    Noreene Filbert, MD     Vital Signs: Vitals:   08/20/17 1145  BP: 106/60  Pulse: (!) 51  Resp: 16  Temp: 97.9 F (36.6 C)  SpO2: 99%      Physical Exam thin white male in no acute distress. awake, alert. Chest clear to auscultation bilaterally. Heart with slightly bradycardic rate, occasional ectopy noted; abdomen soft, positive bowel sounds, nontender. No lower extremity edema.  Imaging: No results found.  Labs:  CBC:  Recent Labs  06/26/17 0900 07/03/17 0830 07/18/17 1028 07/24/17 0744  WBC 9.5 7.4 7.8 9.8  HGB 13.2 13.5 13.4 14.5  HCT 37.8* 39.3* 40.1 43.1  PLT 242 216 165 217    COAGS:  Recent Labs  04/13/17 1350 07/18/17 1028 07/24/17 0743  INR 1.10 1.19 1.03  APTT 32 36  --     BMP:  Recent Labs  06/26/17 0900 07/03/17 0830 07/24/17 0744 07/31/17 0918  NA 136 137 139 140  K 4.2 4.1 3.6 4.6  CL 104 104 102 107  CO2 _0 GLUCOSE 122* 133* 94 96  BUN 20 18 22* 14  CALCIUM 9.3 9.2 9.3 9.1  CREATININE 0.78 0.80 0.75 0.78  GFRNONAA >60 >60 >60 >60  GFRAA >60 >60 >60 >60    LIVER FUNCTION TESTS:  Recent Labs  06/05/17 0845 06/26/17 0900 07/03/17 0830 07/24/17 0744  BILITOT 0.9 0.6 0.7 0.6  AST 87* 143* 108* 81*  ALT 65* 116* 93* 75*  ALKPHOS 76 94 82 77  PROT 8.6* 8.4* 8.1 9.1*  ALBUMIN 3.6 3.4* 3.4* 3.9    Assessment and Plan: Pt with known history of metastatic right lung adenocarcinoma, hepatitis C, hepatitis B, cirrhosis by imaging and recently diagnosed left hepatocellular carcinoma with associated left hepatic and portal vein tumor thrombosis. He has significant lung shunting of 31% on recent test Y 90 dosing which prevented further Y-90 treatment. He presents again today for hepatic DEB-TACE to treat the left lobe hepatocellular carcinoma. Details/risks of procedure, including not limited to, internal bleeding, infection, nontarget embolization, discussed with patient with his understanding and consent. Postprocedure  he will be admitted to the hospital for overnight observation.   Electronically Signed: D. Rowe Robert, PA-C 08/20/2017, 11:36 AM   I spent a total of 30 minutes at the the patient's bedside AND on the patient's hospital floor or unit, greater than 50% of which was counseling/coordinating care for hepatic DEB-TACE (chemoembolization) for hepatocellular carcinoma

## 2017-08-20 NOTE — Procedures (Signed)
Interventional Radiology Procedure Note  Procedure: Left accessory hepatic arteriography with chemoembolization of accessory hepatic artery with drug eluting beads  Complications: None  Estimated Blood Loss: < 10 mL  Findings:  Arteriography of accessory left hepatic artery trunk demonstrates durable embolization of left gastric artery trunk and branches. Additional branch vessel supply now identified to proximal stomach. Chemoembolization performed through microcatheter beyond branch supplying stomach to branches supplying tumor in lateral segment of left lobe of liver. 75 mg of doxorubicin loading of one vial of 300-500 micron LC beads performed overnight by Pharmacy.  Entire dose of loaded drug-eluting particles was able to be administered. Closure: Cordis ExoSeal  Plan:  Overnight observation.  Venetia Night. Kathlene Cote, M.D Pager:  [phone]

## 2017-08-21 DIAGNOSIS — K7460 Unspecified cirrhosis of liver: Secondary | ICD-10-CM | POA: Diagnosis present

## 2017-08-21 DIAGNOSIS — C220 Liver cell carcinoma: Secondary | ICD-10-CM | POA: Diagnosis present

## 2017-08-21 DIAGNOSIS — M069 Rheumatoid arthritis, unspecified: Secondary | ICD-10-CM | POA: Diagnosis present

## 2017-08-21 DIAGNOSIS — I81 Portal vein thrombosis: Secondary | ICD-10-CM | POA: Diagnosis present

## 2017-08-21 DIAGNOSIS — Z7901 Long term (current) use of anticoagulants: Secondary | ICD-10-CM | POA: Diagnosis not present

## 2017-08-21 DIAGNOSIS — I820 Budd-Chiari syndrome: Secondary | ICD-10-CM | POA: Diagnosis present

## 2017-08-21 DIAGNOSIS — B1910 Unspecified viral hepatitis B without hepatic coma: Secondary | ICD-10-CM | POA: Diagnosis present

## 2017-08-21 DIAGNOSIS — C3490 Malignant neoplasm of unspecified part of unspecified bronchus or lung: Secondary | ICD-10-CM | POA: Diagnosis not present

## 2017-08-21 DIAGNOSIS — C3491 Malignant neoplasm of unspecified part of right bronchus or lung: Secondary | ICD-10-CM | POA: Diagnosis present

## 2017-08-21 DIAGNOSIS — C3492 Malignant neoplasm of unspecified part of left bronchus or lung: Secondary | ICD-10-CM | POA: Diagnosis present

## 2017-08-21 DIAGNOSIS — B1920 Unspecified viral hepatitis C without hepatic coma: Secondary | ICD-10-CM | POA: Diagnosis present

## 2017-08-21 LAB — AFP TUMOR MARKER: AFP, SERUM, TUMOR MARKER: 819.2 ng/mL — AB (ref 0.0–8.3)

## 2017-08-21 LAB — CEA: CEA: 2.4 ng/mL (ref 0.0–4.7)

## 2017-08-21 MED ORDER — PIPERACILLIN-TAZOBACTAM 3.375 G IVPB
3.3750 g | Freq: Three times a day (TID) | INTRAVENOUS | Status: DC
  Administered 2017-08-21 – 2017-08-22 (×3): 3.375 g via INTRAVENOUS
  Filled 2017-08-21 (×2): qty 50

## 2017-08-21 MED ORDER — OXYCODONE HCL 5 MG PO TABS
5.0000 mg | ORAL_TABLET | ORAL | Status: DC | PRN
  Administered 2017-08-21 (×2): 5 mg via ORAL
  Filled 2017-08-21 (×2): qty 1

## 2017-08-21 MED ORDER — ENOXAPARIN SODIUM 60 MG/0.6ML ~~LOC~~ SOLN
1.0000 mg/kg | Freq: Two times a day (BID) | SUBCUTANEOUS | Status: DC
  Administered 2017-08-22: 06:00:00 60 mg via SUBCUTANEOUS
  Filled 2017-08-21: qty 0.6

## 2017-08-21 MED ORDER — ENOXAPARIN SODIUM 60 MG/0.6ML ~~LOC~~ SOLN
1.0000 mg/kg | Freq: Once | SUBCUTANEOUS | Status: AC
  Administered 2017-08-21: 60 mg via SUBCUTANEOUS
  Filled 2017-08-21: qty 0.6

## 2017-08-21 MED ORDER — PANTOPRAZOLE SODIUM 40 MG IV SOLR
40.0000 mg | Freq: Once | INTRAVENOUS | Status: AC
  Administered 2017-08-21: 40 mg via INTRAVENOUS
  Filled 2017-08-21: qty 40

## 2017-08-21 NOTE — Progress Notes (Signed)
Referring Physician(s): Corcoran,M  Supervising Physician: Sandi Mariscal  Patient Status:  [facility] - In-pt  Chief Complaint: Lung cancer, hepatocellular carcinoma   Subjective: Pt c/o some RUQ/mild pelvic discomfort, intermittent nausea, esp when upright; no vomiting; has voided, taking liquids ok, last attempted to eat yesterday evening   Allergies: Nsaids and Sulfa antibiotics  Medications: Prior to Admission medications   Medication Sig Start Date End Date Taking? Authorizing Provider  calcium-vitamin D (OSCAL WITH D) 500-200 MG-UNIT tablet Take 1 tablet by mouth 2 (two) times daily. 09/20/16 08/20/17 Yes Creola Corn, MD  enoxaparin (LOVENOX) 60 MG/0.6ML injection Inject 0.6 mLs (60 mg total) into the skin every 12 (twelve) hours. 07/09/17  Yes Corcoran, Drue Second, MD  folic acid (FOLVITE) 1 MG tablet Take 1 tablet (1 mg total) by mouth daily. 11/16/16  Yes Lequita Asal, MD  oxyCODONE (OXY IR/ROXICODONE) 5 MG immediate release tablet Take 1-2 tablets every 4 hours as needed for pain 07/25/17  Yes Karen Kitchens, NP  gabapentin (NEURONTIN) 300 MG capsule Take 1 capsule (300 mg total) by mouth at bedtime. Patient not taking: Reported on 08/20/2017 11/07/16   Lequita Asal, MD  HYDROcodone-acetaminophen (NORCO) 7.5-325 MG tablet Take 1 tablet by mouth every 4 (four) hours as needed for moderate pain (Every 4-6 hours as needed for pain). Patient not taking: Reported on 07/18/2017 07/03/16   Lequita Asal, MD  oxyCODONE-acetaminophen (PERCOCET/ROXICET) 5-325 MG tablet Take 1 tablet by mouth every 6 (six) hours as needed for severe pain. Patient not taking: Reported on 07/18/2017 03/27/17   Lequita Asal, MD  rivaroxaban (XARELTO) 20 MG TABS tablet Take 1 tablet (20 mg total) by mouth daily with supper. Patient not taking: Reported on 08/20/2017 05/11/17   Lequita Asal, MD  sucralfate (CARAFATE) 1 g tablet Take 1 tablet (1 g total) by mouth 3 (three) times  daily. Dissolve in 2-3 tbsp warm water, swish and swallow. Patient not taking: Reported on 07/18/2017 05/02/16   Noreene Filbert, MD     Vital Signs: BP 119/73 (BP Location: Left Arm)   Pulse 79   Temp 98.2 F (36.8 C) (Oral)   Resp 16   Ht 5\' 11"  (1.803 m)   Wt 134 lb (60.8 kg)   SpO2 98%   BMI 18.69 kg/m   Physical Exam awake/alert; chest- distant but clear BS bilat; heart- RRR; abd- soft, mild RUQ/ant pelvic tenderness; +BS; puncture site rt CFA clean, dry,NT, no hematoma; no LE edema  Imaging: Ir Angiogram Visceral Selective  Result Date: 08/20/2017 INDICATION: Hepatocellular carcinoma of the left lobe of the liver. Prior planning arteriography and shunt calculation performed on 07/24/2017. Due to high percentage of pulmonary shunting, Yttrium-90 radioembolization was contraindicated. The patient now presents for planned chemoembolization of left lobe tumor with drug-eluting beads. Prior coil embolization of left gastric arterial supply off of an accessory left hepatic arterial trunk was performed on 07/24/2017. EXAM: 1. ULTRASOUND GUIDANCE FOR VASCULAR ACCESS OF THE RIGHT COMMON FEMORAL ARTERY 2. VISCERAL ARTERIOGRAPHY OF ACCESSORY LEFT HEPATIC ARTERY 3. ADDITIONAL SELECTIVE ARTERIOGRAPHY OF LATERAL SEGMENT LEFT HEPATIC ARTERIAL BRANCHES 4. TRANSCATHETER CHEMOEMBOLIZATION OF LEFT LOBE HEPATOCELLULAR CARCINOMA WITH DRUG-ELUTING BEADS MEDICATIONS: Zosyn 3.375 g IV, Protonix 40 mg IV, Toradol 50 mg IV and Zofran 8 mg IV ANESTHESIA/SEDATION: Moderate (conscious) sedation was employed during this procedure. A total of Versed 2.0 mg and Fentanyl 100 mcg was administered intravenously. Moderate Sedation Time: 44 minutes. The patient's level of consciousness and vital signs were  monitored continuously by radiology nursing throughout the procedure under my direct supervision. CONTRAST:  50 mL Isovue-300 FLUOROSCOPY TIME:  Fluoroscopy Time: 13 minutes and 12 seconds. 430 mGy. COMPLICATIONS: None  immediate. PROCEDURE: Informed consent was obtained from the patient following explanation of the procedure, risks, benefits and alternatives. The patient understands, agrees and consents for the procedure. All questions were addressed. A time out was performed prior to the initiation of the procedure. Maximal barrier sterile technique utilized including caps, mask, sterile gowns, sterile gloves, large sterile drape, hand hygiene, and chlorhexidine prep. Ultrasound was used to confirm patency of the right common femoral artery. The artery was then accessed under direct ultrasound guidance with a micropuncture set. Ultrasound image documentation was performed. After access of the artery, a 5 French sheath was placed over a guidewire. A 5 French Cobra catheter was advanced into the abdominal aorta an used to selectively catheterize the celiac axis. A microcatheter was then introduced through the 5 French catheter and used to selectively catheterize an accessory left hepatic artery emanating from the celiac trunk. Selective arteriography was performed of the accessory left hepatic artery. The catheter was further advanced in the accessory left hepatic artery to the level of a distal bifurcation beyond an accessory gastric branch. Additional selective arteriography was performed. Prior to the procedure, a preparation of 1 vial of 300 -500 micron sized LC beads was loaded with 75 mg of doxorubicin by the pharmacy overnight. This preparation was obtained and diluted with saline and contrast. The entire dose of drug-eluting beads was then slowly administered under fluoroscopic guidance via the microcatheter into accessory left hepatic arterial supply. Fluoroscopic loops were saved. The microcatheter and 5 French catheter were then removed. Oblique arteriography was performed at the level of right femoral artery access. Arteriotomy closure was performed with the Cordis Exoseal device. 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 After prior coil embolization, there is interval visualization and enlargement of a collateral branch off of the left accessory hepatic artery that now supplies some of the distal territory original supplied by the left gastric artery. This collateral trunk also appears to supply branches supplying the distal esophagus and GE junction. Given findings of collateral supply to the stomach and distal esophagus, chemoembolization was performed distal to this branch and near the previously defined bifurcation of the accessory left hepatic arterial trunk into 2 discrete dominant arterial trunks that supply the lateral segment of the left lobe of the liver and underlying left lobe hepatocellular carcinoma. The entire dose of LC beads loaded with 75 mg of doxorubicin was able to be slowly infused into arterial supply to the lateral segment of the left lobe. Fluoroscopy demonstrated slower flow during chemoembolization without stasis or reflux of contrast. Larger sized beads were utilized for this treatment due to the presence shunting to the lungs with injection of MAA previously. This should reduce the risk of pulmonary toxicity. IMPRESSION: Successful transcatheter chemoembolization utilizing drug-eluting beads (DEB-TACE) performed at the level of an accessory left hepatic artery supplying the lateral segment of the left lobe of the liver and known left lobe hepatocellular carcinoma. The patient was admitted for overnight observation. Clinical follow-up will be performed in approximately 3-4 weeks. Electronically Signed   By: Eulas Post  Kathlene Cote M.D.   On: 08/20/2017 17:13   Ir Angiogram Selective Each Additional  Vessel  Result Date: 08/20/2017 INDICATION: Hepatocellular carcinoma of the left lobe of the liver. Prior planning arteriography and shunt calculation performed on 07/24/2017. Due to high percentage of pulmonary shunting, Yttrium-90 radioembolization was contraindicated. The patient now presents for planned chemoembolization of left lobe tumor with drug-eluting beads. Prior coil embolization of left gastric arterial supply off of an accessory left hepatic arterial trunk was performed on 07/24/2017. EXAM: 1. ULTRASOUND GUIDANCE FOR VASCULAR ACCESS OF THE RIGHT COMMON FEMORAL ARTERY 2. VISCERAL ARTERIOGRAPHY OF ACCESSORY LEFT HEPATIC ARTERY 3. ADDITIONAL SELECTIVE ARTERIOGRAPHY OF LATERAL SEGMENT LEFT HEPATIC ARTERIAL BRANCHES 4. TRANSCATHETER CHEMOEMBOLIZATION OF LEFT LOBE HEPATOCELLULAR CARCINOMA WITH DRUG-ELUTING BEADS MEDICATIONS: Zosyn 3.375 g IV, Protonix 40 mg IV, Toradol 50 mg IV and Zofran 8 mg IV ANESTHESIA/SEDATION: Moderate (conscious) sedation was employed during this procedure. A total of Versed 2.0 mg and Fentanyl 100 mcg was administered intravenously. Moderate Sedation Time: 44 minutes. The patient's level of consciousness and vital signs were monitored continuously by radiology nursing throughout the procedure under my direct supervision. CONTRAST:  50 mL Isovue-300 FLUOROSCOPY TIME:  Fluoroscopy Time: 13 minutes and 12 seconds. 430 mGy. COMPLICATIONS: None immediate. PROCEDURE: Informed consent was obtained from the patient following explanation of the procedure, risks, benefits and alternatives. The patient understands, agrees and consents for the procedure. All questions were addressed. A time out was performed prior to the initiation of the procedure. Maximal barrier sterile technique utilized including caps, mask, sterile gowns, sterile gloves, large sterile drape, hand hygiene, and chlorhexidine prep. Ultrasound was used to confirm patency of the right common femoral artery. The artery was  then accessed under direct ultrasound guidance with a micropuncture set. Ultrasound image documentation was performed. After access of the artery, a 5 French sheath was placed over a guidewire. A 5 French Cobra catheter was advanced into the abdominal aorta an used to selectively catheterize the celiac axis. A microcatheter was then introduced through the 5 French catheter and used to selectively catheterize an accessory left hepatic artery emanating from the celiac trunk. Selective arteriography was performed of the accessory left hepatic artery. The catheter was further advanced in the accessory left hepatic artery to the level of a distal bifurcation beyond an accessory gastric branch. Additional selective arteriography was performed. Prior to the procedure, a preparation of 1 vial of 300 -500 micron sized LC beads was loaded with 75 mg of doxorubicin by the pharmacy overnight. This preparation was obtained and diluted with saline and contrast. The entire dose of drug-eluting beads was then slowly administered under fluoroscopic guidance via the microcatheter into accessory left hepatic arterial supply. Fluoroscopic loops were saved. The microcatheter and 5 French catheter were then removed. Oblique arteriography was performed at the level of right femoral artery access. Arteriotomy closure was performed with the Cordis Exoseal device. 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 After prior coil embolization, there is interval visualization and enlargement of a collateral branch off of the left accessory hepatic artery that now supplies some of the distal territory original supplied by the left gastric artery.  This collateral trunk also appears to supply branches supplying the distal esophagus and GE junction. Given findings  of collateral supply to the stomach and distal esophagus, chemoembolization was performed distal to this branch and near the previously defined bifurcation of the accessory left hepatic arterial trunk into 2 discrete dominant arterial trunks that supply the lateral segment of the left lobe of the liver and underlying left lobe hepatocellular carcinoma. The entire dose of LC beads loaded with 75 mg of doxorubicin was able to be slowly infused into arterial supply to the lateral segment of the left lobe. Fluoroscopy demonstrated slower flow during chemoembolization without stasis or reflux of contrast. Larger sized beads were utilized for this treatment due to the presence shunting to the lungs with injection of MAA previously. This should reduce the risk of pulmonary toxicity. IMPRESSION: Successful transcatheter chemoembolization utilizing drug-eluting beads (DEB-TACE) performed at the level of an accessory left hepatic artery supplying the lateral segment of the left lobe of the liver and known left lobe hepatocellular carcinoma. The patient was admitted for overnight observation. Clinical follow-up will be performed in approximately 3-4 weeks. Electronically Signed   By: Aletta Edouard M.D.   On: 08/20/2017 17:13   Ir Embo Tumor Organ Ischemia Infarct Inc Guide Roadmapping  Result Date: 08/20/2017 INDICATION: Hepatocellular carcinoma of the left lobe of the liver. Prior planning arteriography and shunt calculation performed on 07/24/2017. Due to high percentage of pulmonary shunting, Yttrium-90 radioembolization was contraindicated. The patient now presents for planned chemoembolization of left lobe tumor with drug-eluting beads. Prior coil embolization of left gastric arterial supply off of an accessory left hepatic arterial trunk was performed on 07/24/2017. EXAM: 1. ULTRASOUND GUIDANCE FOR  VASCULAR ACCESS OF THE RIGHT COMMON FEMORAL ARTERY 2. VISCERAL ARTERIOGRAPHY OF ACCESSORY LEFT HEPATIC ARTERY 3. ADDITIONAL SELECTIVE ARTERIOGRAPHY OF LATERAL SEGMENT LEFT HEPATIC ARTERIAL BRANCHES 4. TRANSCATHETER CHEMOEMBOLIZATION OF LEFT LOBE HEPATOCELLULAR CARCINOMA WITH DRUG-ELUTING BEADS MEDICATIONS: Zosyn 3.375 g IV, Protonix 40 mg IV, Toradol 50 mg IV and Zofran 8 mg IV ANESTHESIA/SEDATION: Moderate (conscious) sedation was employed during this procedure. A total of Versed 2.0 mg and Fentanyl 100 mcg was administered intravenously. Moderate Sedation Time: 44 minutes. The patient's level of consciousness and vital signs were monitored continuously by radiology nursing throughout the procedure under my direct supervision. CONTRAST:  50 mL Isovue-300 FLUOROSCOPY TIME:  Fluoroscopy Time: 13 minutes and 12 seconds. 430 mGy. COMPLICATIONS: None immediate. PROCEDURE: Informed consent was obtained from the patient following explanation of the procedure, risks, benefits and alternatives. The patient understands, agrees and consents for the procedure. All questions were addressed. A time out was performed prior to the initiation of the procedure. Maximal barrier sterile technique utilized including caps, mask, sterile gowns, sterile gloves, large sterile drape, hand hygiene, and chlorhexidine prep. Ultrasound was used to confirm patency of the right common femoral artery. The artery was then accessed under direct ultrasound guidance with a micropuncture set. Ultrasound image documentation was performed. After access of the artery, a 5 French sheath was placed over a guidewire. A 5 French Cobra catheter was advanced into the abdominal aorta an used to selectively catheterize the celiac axis. A microcatheter was then introduced through the 5 French catheter and used to selectively catheterize an accessory left hepatic artery emanating from the celiac trunk. Selective arteriography was performed of the accessory left  hepatic artery. The catheter was further advanced in the accessory left hepatic artery to the level of a distal bifurcation beyond an accessory gastric branch. Additional selective arteriography was performed. Prior to the procedure, a preparation of 1 vial of 300 -500 micron sized  LC beads was loaded with 75 mg of doxorubicin by the pharmacy overnight. This preparation was obtained and diluted with saline and contrast. The entire dose of drug-eluting beads was then slowly administered under fluoroscopic guidance via the microcatheter into accessory left hepatic arterial supply. Fluoroscopic loops were saved. The microcatheter and 5 French catheter were then removed. Oblique arteriography was performed at the level of right femoral artery access. Arteriotomy closure was performed with the Cordis Exoseal device. 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 After prior coil embolization, there is interval visualization and enlargement of a collateral branch off of the left accessory hepatic artery that now supplies some of the distal territory original supplied by the left gastric artery. This collateral trunk also appears to supply branches supplying the distal esophagus and GE junction. Given findings of collateral supply to the stomach and distal esophagus, chemoembolization was performed distal to this branch and near the previously defined bifurcation of the accessory left hepatic arterial trunk into 2 discrete dominant arterial trunks that supply the lateral segment of the left lobe of the liver and underlying left lobe hepatocellular carcinoma. The entire dose of LC beads loaded with 75 mg of doxorubicin was  able to be slowly infused into arterial supply to the lateral segment of the left lobe. Fluoroscopy demonstrated slower flow during chemoembolization without stasis or reflux of contrast. Larger sized beads were utilized for this treatment due to the presence shunting to the lungs with injection of MAA previously. This should reduce the risk of pulmonary toxicity. IMPRESSION: Successful transcatheter chemoembolization utilizing drug-eluting beads (DEB-TACE) performed at the level of an accessory left hepatic artery supplying the lateral segment of the left lobe of the liver and known left lobe hepatocellular carcinoma. The patient was admitted for overnight observation. Clinical follow-up will be performed in approximately 3-4 weeks. Electronically Signed   By: Aletta Edouard M.D.   On: 08/20/2017 17:13    Labs:  CBC:  Recent Labs  07/03/17 0830 07/18/17 1028 07/24/17 0744 08/20/17 1210  WBC 7.4 7.8 9.8 7.1  HGB 13.5 13.4 14.5 12.3*  HCT 39.3* 40.1 43.1 37.2*  PLT 216 165 217 123*    COAGS:  Recent Labs  04/13/17 1350 07/18/17 1028 07/24/17 0743 08/20/17 1210  INR 1.10 1.19 1.03 1.06  APTT 32 36  --  27    BMP:  Recent Labs  07/03/17 0830 07/24/17 0744 07/31/17 0918 08/20/17 1210  NA 137 139 140 139  K 4.1 3.6 4.6 4.4  CL 104 102 107 106  CO2 28 27 29 26   GLUCOSE 133* 94 96 99  BUN 18 22* 14 15  CALCIUM 9.2 9.3 9.1 8.7*  CREATININE 0.80 0.75 0.78 0.68  GFRNONAA >60 >60 >60 >60  GFRAA >60 >60 >60 >60    LIVER FUNCTION TESTS:  Recent Labs  06/26/17 0900 07/03/17 0830 07/24/17 0744 08/20/17 1210  BILITOT 0.6 0.7 0.6 0.7  AST 143* 108* 81* 66*  ALT 116* 93* 75* 48  ALKPHOS 94 82 77 92  PROT 8.4* 8.1 9.1* 8.1  ALBUMIN 3.4* 3.4* 3.9 3.3*    Assessment and Plan: Pt with hx lung cancer, left lobe HCC; s/p left hepatic DEB-TACE 9/10; zofran prn nausea, resume oxycodone for pain;  advance diet as tolerated; hydrate; will update Dr. Kathlene Cote; d/c home once  nausea resolves, able to tolerate diet   Electronically Signed: D. Rowe Robert, PA-C 08/21/2017, 12:53 PM   I spent a total of 15 minutes at the the patient's bedside AND on the patient's hospital floor or unit, greater than 50% of which was counseling/coordinating care for left hepatic DEB-TACE for hepatocellular carcinoma    Patient ID: Gerald [name], male   DOB: 12-Sep-1951, 66 y.o.   MRN: [id number]

## 2017-08-21 NOTE — Progress Notes (Signed)
Pt nauseated. Not wanting to ambulate at this time. Reassured pt.

## 2017-08-22 ENCOUNTER — Other Ambulatory Visit: Payer: Self-pay | Admitting: Radiology

## 2017-08-22 ENCOUNTER — Other Ambulatory Visit: Payer: Self-pay | Admitting: *Deleted

## 2017-08-22 DIAGNOSIS — Z85118 Personal history of other malignant neoplasm of bronchus and lung: Secondary | ICD-10-CM

## 2017-08-22 DIAGNOSIS — C220 Liver cell carcinoma: Secondary | ICD-10-CM

## 2017-08-22 LAB — COMPREHENSIVE METABOLIC PANEL
ALBUMIN: 2.9 g/dL — AB (ref 3.5–5.0)
ALK PHOS: 70 U/L (ref 38–126)
ALT: 167 U/L — ABNORMAL HIGH (ref 17–63)
ANION GAP: 8 (ref 5–15)
AST: 286 U/L — AB (ref 15–41)
BILIRUBIN TOTAL: 1 mg/dL (ref 0.3–1.2)
BUN: 11 mg/dL (ref 6–20)
CALCIUM: 7.9 mg/dL — AB (ref 8.9–10.3)
CO2: 22 mmol/L (ref 22–32)
Chloride: 99 mmol/L — ABNORMAL LOW (ref 101–111)
Creatinine, Ser: 0.74 mg/dL (ref 0.61–1.24)
GFR calc Af Amer: >60 mL/min (ref >60)
GLUCOSE: 106 mg/dL — AB (ref 65–99)
Potassium: 3.8 mmol/L (ref 3.5–5.1)
Sodium: 129 mmol/L — ABNORMAL LOW (ref 135–145)
TOTAL PROTEIN: 6.8 g/dL (ref 6.5–8.1)

## 2017-08-22 LAB — CBC WITH DIFFERENTIAL/PLATELET
BASOS PCT: 0 %
Basophils Absolute: 0 10*3/uL (ref 0.0–0.1)
Eosinophils Absolute: 0 10*3/uL (ref 0.0–0.7)
Eosinophils Relative: 0 %
HEMATOCRIT: 36.5 % — AB (ref 39.0–52.0)
HEMOGLOBIN: 12.4 g/dL — AB (ref 13.0–17.0)
LYMPHS PCT: 6 %
Lymphs Abs: 1 10*3/uL (ref 0.7–4.0)
MCH: 32 pg (ref 26.0–34.0)
MCHC: 34 g/dL (ref 30.0–36.0)
MCV: 94.1 fL (ref 78.0–100.0)
MONOS PCT: 13 %
Monocytes Absolute: 2.4 10*3/uL — ABNORMAL HIGH (ref 0.1–1.0)
NEUTROS ABS: 14.4 10*3/uL — AB (ref 1.7–7.7)
NEUTROS PCT: 81 %
Platelets: 160 10*3/uL (ref 150–400)
RBC: 3.88 MIL/uL — ABNORMAL LOW (ref 4.22–5.81)
RDW: 13.9 % (ref 11.5–15.5)
WBC: 17.8 10*3/uL — ABNORMAL HIGH (ref 4.0–10.5)

## 2017-08-22 NOTE — Discharge Instructions (Signed)
Chemoembolization, Care After This sheet gives you information about how to care for yourself after your procedure. Your health care provider may also give you more specific instructions. If you have problems or questions, contact your health care provider. What can I expect after the procedure? After the procedure, it is common to have:  Pain, nausea, vomiting, and fever (post-embolization syndrome). If your fever gets worse, tell your health care provider.  Fatigue.  Loss of appetite. This should gradually improve after about 1 week.  Soreness and tenderness in the area where the needle and catheter were placed (puncture site).  Follow these instructions at home: Puncture site care  Follow instructions from your health care provider about how to take care of the puncture site. Make sure you: ? Wash your hands with soap and water before you change your bandage (dressing). If soap and water are not available, use hand sanitizer. ? Change your dressing as told by your health care provider. ? Leave stitches (sutures), skin glue, or adhesive strips in place. These skin closures may need to stay in place for 2 weeks or longer. If adhesive strip edges start to loosen and curl up, you may trim the loose edges. Do not remove adhesive strips completely unless your health care provider tells you to do that.  Check your puncture site every day for signs of infection. Check for: ? More redness, swelling, or pain. ? More fluid or blood. ? Warmth. ? Pus or a bad smell. Activity  Rest and return to your normal activities as told by your health care provider. Ask your health care provider what activities are safe for you.  Do not drive for 24 hours after the procedure if you were given a medicine to help you relax (sedative).  Do not lift anything that is heavier than 10 lb (4.5 kg) until your health care provider says that it is safe. Medicines  Take over-the-counter and prescription medicines  only as told by your health care provider.  Do not drive or use heavy machinery while taking prescription pain medicine. General instructions   To prevent or treat constipation while you are taking prescription pain medicine, your health care provider may recommend that you: ? Drink enough fluid to keep your urine clear or pale yellow. ? Take over-the-counter or prescription medicines. ? Eat foods that are high in fiber, such as fresh fruits and vegetables, whole grains, and beans. ? Limit foods that are high in fat and processed sugars, such as fried and sweet foods.  Eat frequent small meals until your appetite returns. Follow instructions from your health care provider about eating or drinking restrictions.  Do not take baths, swim, or use a hot tub until your health care provider approves. You may take showers. Wash your puncture site with mild soap and water and pat the area dry.  Wear compression stockings as told by your health care provider. These stockings help to prevent blood clots and reduce swelling in your legs.  If you were given a small breathing device (incentive spirometer), use it as directed. It helps keep your lungs clear while you are recovering. Use it for as long as directed.  Keep all follow-up visits as told by your health care provider. This is important. You may need to have blood tests and imaging tests done. Contact a health care provider if:  You have more redness, swelling, or pain around your puncture site.  You have more fluid or blood coming from your puncture site.  Your puncture site feels warm to the touch.  You have pus or a bad smell coming from your puncture site.  You have a fever that gets worse or is higher than what your health care provider told you to expect.  You have pain that gets worse or does not get better with medicine.  You have nausea or vomiting that lasts for more than 2 days.  You cannot drink fluids without  vomiting.  You develop a rash. Get help right away if:  You develop any of the following in your legs: ? Pain. ? Swelling. ? Skin that is cold or pale or turns blue.  You develop shortness of breath.  You faint.  You have chest pain.  You have weakness or difficulty moving your arms or legs.  You have changes in your speech or your vision. This information is not intended to replace advice given to you by your health care provider. Make sure you discuss any questions you have with your health care provider. Document Released: 07/26/2011 Document Revised: 08/23/2016 Document Reviewed: 08/23/2016 Elsevier Interactive Patient Education  [employer].

## 2017-08-22 NOTE — Discharge Summary (Signed)
Patient ID: Gerald [name] MRN: [id number] DOB/AGE: 02-20-1951 66 y.o.  Admit date: 08/20/2017 Discharge date: 08/22/2017  Supervising Physician: Aletta Edouard  Patient Status: [facility] - In-pt  Admission Diagnoses: Lung cancer, hepatocellular carcinoma  Discharge Diagnoses: Lung cancer, hepatocellular carcinoma, status post successful transcatheter chemoembolization utilizing drug eluding beads performed at the level of an accessory left hepatic artery supplying the lateral segment of the left lobe of the liver and known left lobe hepatocellular carcinoma on 08/20/17 Active Problems:   Hepatocellular carcinoma ([place])  Past Medical History:  Diagnosis Date  . Arthritis    Rheumatoid arthritis  . Cancer ([place])    lung ca   . Collagen vascular disease ([facility])    RA  . Dyspnea   . Hepatitis    b and c  . Mass of lung   . Pneumonia   . Pneumothorax    spontaneous  . Renal disorder    as a child   Past Surgical History:  Procedure Laterality Date  . FLEXIBLE BRONCHOSCOPY N/A 03/30/2016   Procedure: FLEXIBLE BRONCHOSCOPY;  Surgeon: Vilinda Boehringer, MD;  Location: [facility] ORS;  Service: Cardiopulmonary;  Laterality: N/A;  . IR ANGIOGRAM SELECTIVE EACH ADDITIONAL VESSEL  07/24/2017  . IR ANGIOGRAM SELECTIVE EACH ADDITIONAL VESSEL  07/24/2017  . IR ANGIOGRAM SELECTIVE EACH ADDITIONAL VESSEL  07/24/2017  . IR ANGIOGRAM SELECTIVE EACH ADDITIONAL VESSEL  07/24/2017  . IR ANGIOGRAM SELECTIVE EACH ADDITIONAL VESSEL  07/24/2017  . IR ANGIOGRAM SELECTIVE EACH ADDITIONAL VESSEL  07/24/2017  . IR ANGIOGRAM SELECTIVE EACH ADDITIONAL VESSEL  07/24/2017  . IR ANGIOGRAM SELECTIVE EACH ADDITIONAL VESSEL  08/20/2017  . IR ANGIOGRAM VISCERAL SELECTIVE  07/24/2017  . IR ANGIOGRAM VISCERAL SELECTIVE  08/20/2017  . IR EMBO ARTERIAL NOT HEMORR HEMANG INC GUIDE ROADMAPPING  07/24/2017  . IR EMBO TUMOR ORGAN ISCHEMIA INFARCT INC GUIDE ROADMAPPING  08/20/2017  . IR US GUIDE VASC ACCESS RIGHT  07/24/2017  . Ureter repair as  a child        Discharged Condition: good  Hospital Course: Mr. 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 He had significant lung shunting of 31% on recent test Y 90 dosing which prevented further Y 90 treatment of left lobe HCC. He was deemed an appropriate candidate for hepatic chemoembolization of left lobe liver and underwent this procedure on 08/20/17 utilizing doxorubicin beads. He tolerated the procedure well and was admitted to the hospital afterwards for observation. He was placed on IV antibiotic therapy and given pain meds and antiemetics as needed. Due to some persistent nausea with ambulation/rt abd pain and minimal by mouth intake he was kept an additional night. On the day of discharge the patient was feeling better. He was able to ambulate, void and tolerate small portions of food without significant difficulty. He did have mild temperature elevation of 100. Follow-up labs included WBC 17.8, hemoglobin 12.4, creatinine 0.74, total bilirubin normal, AST/ALT elevated as expected. The above findings were discussed with Drs. [place] and [place] and pt was deemed stable for discharge at this time. He will follow-up with Dr. Kathlene Cote in the [place] clinic in 3-4 weeks. He will maintain current oncologic care with Dr. Mike Gip. He will resume his usual home medications. He was given a prescription for Cipro 500 mg, #14, no refills, 1 tablet twice a day. He was told  to contact our service with any additional questions or concerns.  Consults: none  Significant Diagnostic Studies:  Results for orders placed or performed during the hospital encounter of 08/20/17  APTT upon arrival  Result Value Ref Range   aPTT 27 24 - 36 seconds  CBC upon arrival  Result Value Ref Range   WBC 7.1 4.0 - 10.5 K/uL    RBC 3.80 (L) 4.22 - 5.81 MIL/uL   Hemoglobin 12.3 (L) 13.0 - 17.0 g/dL   HCT 37.2 (L) 39.0 - 52.0 %   MCV 97.9 78.0 - 100.0 fL   MCH 32.4 26.0 - 34.0 pg   MCHC 33.1 30.0 - 36.0 g/dL   RDW 14.3 11.5 - 15.5 %   Platelets 123 (L) 150 - 400 K/uL  Protime-INR upon arrival  Result Value Ref Range   Prothrombin Time 13.7 11.4 - 15.2 seconds   INR 1.06   AFP tumor marker  Result Value Ref Range   AFP, Serum, Tumor Marker 819.2 (H) 0.0 - 8.3 ng/mL  CEA  Result Value Ref Range   CEA 2.4 0.0 - 4.7 ng/mL  Comprehensive metabolic panel  Result Value Ref Range   Sodium 139 135 - 145 mmol/L   Potassium 4.4 3.5 - 5.1 mmol/L   Chloride 106 101 - 111 mmol/L   CO2 26 22 - 32 mmol/L   Glucose, Bld 99 65 - 99 mg/dL   BUN 15 6 - 20 mg/dL   Creatinine, Ser 0.68 0.61 - 1.24 mg/dL   Calcium 8.7 (L) 8.9 - 10.3 mg/dL   Total Protein 8.1 6.5 - 8.1 g/dL   Albumin 3.3 (L) 3.5 - 5.0 g/dL   AST 66 (H) 15 - 41 U/L   ALT 48 17 - 63 U/L   Alkaline Phosphatase 92 38 - 126 U/L   Total Bilirubin 0.7 0.3 - 1.2 mg/dL   GFR calc non Af Amer >60 >60 mL/min   GFR calc Af Amer >60 >60 mL/min   Anion gap 7 5 - 15  CBC with Differential/Platelet  Result Value Ref Range   WBC 17.8 (H) 4.0 - 10.5 K/uL   RBC 3.88 (L) 4.22 - 5.81 MIL/uL   Hemoglobin 12.4 (L) 13.0 - 17.0 g/dL   HCT 36.5 (L) 39.0 - 52.0 %   MCV 94.1 78.0 - 100.0 fL   MCH 32.0 26.0 - 34.0 pg   MCHC 34.0 30.0 - 36.0 g/dL   RDW 13.9 11.5 - 15.5 %   Platelets 160 150 - 400 K/uL   Neutrophils Relative % 81 %   Neutro Abs 14.4 (H) 1.7 - 7.7 K/uL   Lymphocytes Relative 6 %   Lymphs Abs 1.0 0.7 - 4.0 K/uL   Monocytes Relative 13 %   Monocytes Absolute 2.4 (H) 0.1 - 1.0 K/uL   Eosinophils Relative 0 %   Eosinophils Absolute 0.0 0.0 - 0.7 K/uL   Basophils Relative 0 %   Basophils Absolute 0.0 0.0 - 0.1 K/uL  Comprehensive metabolic panel  Result Value Ref Range   Sodium 129 (L) 135 - 145 mmol/L   Potassium 3.8 3.5 - 5.1 mmol/L   Chloride 99 (L)  101 - 111 mmol/L   CO2 22 22 - 32 mmol/L   Glucose, Bld 106 (H) 65 - 99 mg/dL   BUN 11 6 - 20 mg/dL   Creatinine, Ser 0.74 0.61 - 1.24 mg/dL   Calcium 7.9 (L) 8.9 - 10.3 mg/dL   Total Protein 6.8 6.5 - 8.1 g/dL  Albumin 2.9 (L) 3.5 - 5.0 g/dL   AST 286 (H) 15 - 41 U/L   ALT 167 (H) 17 - 63 U/L   Alkaline Phosphatase 70 38 - 126 U/L   Total Bilirubin 1.0 0.3 - 1.2 mg/dL   GFR calc non Af Amer >60 >60 mL/min   GFR calc Af Amer >60 >60 mL/min   Anion gap 8 5 - 15     Treatments: Successful transcatheter chemoembolization utilizing drug-eluting beads (DEB-TACE) performed at the level of an accessory left hepatic artery supplying the lateral segment of the left lobe of the liver and known left lobe hepatocellular carcinoma via IV conscious sedation on 08/20/17  Discharge Exam: Blood pressure 122/68, pulse 63, temperature 100.1 F (37.8 C), temperature source Oral, resp. rate 16, height 5\' 11"  (1.803 m), weight 134 lb (60.8 kg), SpO2 99 %. Awake, alert. Chest with distant but clear breath sounds bilaterally. Heart with regular rate and rhythm. Abdomen soft, positive bowel sounds, mildly tender right abdominal region, right common femoral artery puncture site soft, nontender, no hematoma, intact distal pulses, no lower extremity edema.  Disposition: home  Discharge Instructions    Call MD for:  difficulty breathing, headache or visual disturbances    Complete by:  As directed    Call MD for:  extreme fatigue    Complete by:  As directed    Call MD for:  hives    Complete by:  As directed    Call MD for:  persistant dizziness or light-headedness    Complete by:  As directed    Call MD for:  persistant nausea and vomiting    Complete by:  As directed    Call MD for:  redness, tenderness, or signs of infection (pain, swelling, redness, odor or green/yellow discharge around incision site)    Complete by:  As directed    Call MD for:  severe uncontrolled pain    Complete by:  As  directed    Call MD for:  temperature >100.4    Complete by:  As directed    Diet - low sodium heart healthy    Complete by:  As directed    Discharge instructions    Complete by:  As directed    Please stay well-hydrated, maintain adequate nutrition and take medications as prescribed.   Increase activity slowly    Complete by:  As directed    Lifting restrictions    Complete by:  As directed    No heavy lifting for next 3-4 days     Allergies as of 08/22/2017      Reactions   Nsaids Other (See Comments)   Can take NSAIDs short term but cannot be on it long term d/t renal insuff   Sulfa Antibiotics Other (See Comments)   Reaction:  GI upset       Medication List    STOP taking these medications   gabapentin 300 MG capsule Commonly known as:  NEURONTIN   HYDROcodone-acetaminophen 7.5-325 MG tablet Commonly known as:  NORCO   oxyCODONE-acetaminophen 5-325 MG tablet Commonly known as:  PERCOCET/ROXICET   rivaroxaban 20 MG Tabs tablet Commonly known as:  XARELTO   sucralfate 1 g tablet Commonly known as:  CARAFATE     TAKE these medications   calcium-vitamin D 500-200 MG-UNIT tablet Commonly known as:  OSCAL WITH D Take 1 tablet by mouth 2 (two) times daily.   enoxaparin 60 MG/0.6ML injection Commonly known as:  LOVENOX Inject 0.6 mLs (  60 mg total) into the skin every 12 (twelve) hours.   folic acid 1 MG tablet Commonly known as:  FOLVITE Take 1 tablet (1 mg total) by mouth daily.   oxyCODONE 5 MG immediate release tablet Commonly known as:  Oxy IR/ROXICODONE Take 1-2 tablets every 4 hours as needed for pain            Discharge Care Instructions        Start     Ordered   08/22/17 0000  Increase activity slowly     08/22/17 1340   08/22/17 0000  Diet - low sodium heart healthy     08/22/17 1340   08/22/17 0000  Lifting restrictions    Comments:  No heavy lifting for next 3-4 days   08/22/17 1340   08/22/17 0000  Discharge instructions      Comments:  Please stay well-hydrated, maintain adequate nutrition and take medications as prescribed.   08/22/17 1340   08/22/17 0000  Call MD for:  temperature >100.4     08/22/17 1340   08/22/17 0000  Call MD for:  persistant nausea and vomiting     08/22/17 1340   08/22/17 0000  Call MD for:  severe uncontrolled pain     08/22/17 1340   08/22/17 0000  Call MD for:  redness, tenderness, or signs of infection (pain, swelling, redness, odor or green/yellow discharge around incision site)     08/22/17 1340   08/22/17 0000  Call MD for:  difficulty breathing, headache or visual disturbances     08/22/17 1340   08/22/17 0000  Call MD for:  hives     08/22/17 1340   08/22/17 0000  Call MD for:  persistant dizziness or light-headedness     08/22/17 1340   08/22/17 0000  Call MD for:  extreme fatigue     08/22/17 1340     Follow-up Information    Aletta Edouard, MD Follow up.   Specialty:  Interventional Radiology Why:  Radiology will call you with follow up appointment in the IR clinic with Dr. Kathlene Cote in 3-4 weeks. Call [phone] or [phone] with any questions or concerns. Contact information: [address]  [place] [address] [phone]        Lequita Asal, MD Follow up.   Specialty:  Hematology and Oncology Why:  Continue current care with Dr. Mike Gip as scheduled. Contact information: [place]  [address] [phone]            Electronically Signed: D. Rowe Robert, PA-C 08/22/2017, 1:42 PM   I have spent Less than 30 minutes discharging Gerald [name].

## 2017-08-22 NOTE — Progress Notes (Signed)
Referring Physician(s): Corcoran,M  Supervising Physician: Daryll Brod  Patient Status:  [facility] - In-pt  Chief Complaint:  Lung cancer, hepatocellular carcinoma  Subjective: Pt currently sleeping but arousable; has not eaten any food; has had some liquids; nausea has improved; no vomiting; has voided; abd pain improved   Allergies: Nsaids and Sulfa antibiotics  Medications: Prior to Admission medications   Medication Sig Start Date End Date Taking? Authorizing Provider  calcium-vitamin D (OSCAL WITH D) 500-200 MG-UNIT tablet Take 1 tablet by mouth 2 (two) times daily. 09/20/16 08/20/17 Yes Creola Corn, MD  enoxaparin (LOVENOX) 60 MG/0.6ML injection Inject 0.6 mLs (60 mg total) into the skin every 12 (twelve) hours. 07/09/17  Yes Corcoran, Drue Second, MD  folic acid (FOLVITE) 1 MG tablet Take 1 tablet (1 mg total) by mouth daily. 11/16/16  Yes Lequita Asal, MD  oxyCODONE (OXY IR/ROXICODONE) 5 MG immediate release tablet Take 1-2 tablets every 4 hours as needed for pain 07/25/17  Yes Karen Kitchens, NP  gabapentin (NEURONTIN) 300 MG capsule Take 1 capsule (300 mg total) by mouth at bedtime. Patient not taking: Reported on 08/20/2017 11/07/16   Lequita Asal, MD  HYDROcodone-acetaminophen (NORCO) 7.5-325 MG tablet Take 1 tablet by mouth every 4 (four) hours as needed for moderate pain (Every 4-6 hours as needed for pain). Patient not taking: Reported on 07/18/2017 07/03/16   Lequita Asal, MD  oxyCODONE-acetaminophen (PERCOCET/ROXICET) 5-325 MG tablet Take 1 tablet by mouth every 6 (six) hours as needed for severe pain. Patient not taking: Reported on 07/18/2017 03/27/17   Lequita Asal, MD  rivaroxaban (XARELTO) 20 MG TABS tablet Take 1 tablet (20 mg total) by mouth daily with supper. Patient not taking: Reported on 08/20/2017 05/11/17   Lequita Asal, MD  sucralfate (CARAFATE) 1 g tablet Take 1 tablet (1 g total) by mouth 3 (three) times daily. Dissolve in  2-3 tbsp warm water, swish and swallow. Patient not taking: Reported on 07/18/2017 05/02/16   Noreene Filbert, MD     Vital Signs: BP 122/68 (BP Location: Left Arm)   Pulse 63   Temp 100.1 F (37.8 C) (Oral)   Resp 16   Ht 5\' 11"  (1.803 m)   Wt 134 lb (60.8 kg)   SpO2 99%   BMI 18.69 kg/m   Physical Exam sl drowsy; chest- distant but clear BS bilat; heart- RRR; abd- soft,+BS, mildly tender rt abd region, rt CFA site soft,NT, no hematoma; intact distal pulses, no LE edema  Imaging: Ir Angiogram Visceral Selective  Result Date: 08/20/2017 INDICATION: Hepatocellular carcinoma of the left lobe of the liver. Prior planning arteriography and shunt calculation performed on 07/24/2017. Due to high percentage of pulmonary shunting, Yttrium-90 radioembolization was contraindicated. The patient now presents for planned chemoembolization of left lobe tumor with drug-eluting beads. Prior coil embolization of left gastric arterial supply off of an accessory left hepatic arterial trunk was performed on 07/24/2017. EXAM: 1. ULTRASOUND GUIDANCE FOR VASCULAR ACCESS OF THE RIGHT COMMON FEMORAL ARTERY 2. VISCERAL ARTERIOGRAPHY OF ACCESSORY LEFT HEPATIC ARTERY 3. ADDITIONAL SELECTIVE ARTERIOGRAPHY OF LATERAL SEGMENT LEFT HEPATIC ARTERIAL BRANCHES 4. TRANSCATHETER CHEMOEMBOLIZATION OF LEFT LOBE HEPATOCELLULAR CARCINOMA WITH DRUG-ELUTING BEADS MEDICATIONS: Zosyn 3.375 g IV, Protonix 40 mg IV, Toradol 50 mg IV and Zofran 8 mg IV ANESTHESIA/SEDATION: Moderate (conscious) sedation was employed during this procedure. A total of Versed 2.0 mg and Fentanyl 100 mcg was administered intravenously. Moderate Sedation Time: 44 minutes. The patient's level of consciousness and vital  signs were monitored continuously by radiology nursing throughout the procedure under my direct supervision. CONTRAST:  50 mL Isovue-300 FLUOROSCOPY TIME:  Fluoroscopy Time: 13 minutes and 12 seconds. 430 mGy. COMPLICATIONS: None immediate. PROCEDURE:  Informed consent was obtained from the patient following explanation of the procedure, risks, benefits and alternatives. The patient understands, agrees and consents for the procedure. All questions were addressed. A time out was performed prior to the initiation of the procedure. Maximal barrier sterile technique utilized including caps, mask, sterile gowns, sterile gloves, large sterile drape, hand hygiene, and chlorhexidine prep. Ultrasound was used to confirm patency of the right common femoral artery. The artery was then accessed under direct ultrasound guidance with a micropuncture set. Ultrasound image documentation was performed. After access of the artery, a 5 French sheath was placed over a guidewire. A 5 French Cobra catheter was advanced into the abdominal aorta an used to selectively catheterize the celiac axis. A microcatheter was then introduced through the 5 French catheter and used to selectively catheterize an accessory left hepatic artery emanating from the celiac trunk. Selective arteriography was performed of the accessory left hepatic artery. The catheter was further advanced in the accessory left hepatic artery to the level of a distal bifurcation beyond an accessory gastric branch. Additional selective arteriography was performed. Prior to the procedure, a preparation of 1 vial of 300 -500 micron sized LC beads was loaded with 75 mg of doxorubicin by the pharmacy overnight. This preparation was obtained and diluted with saline and contrast. The entire dose of drug-eluting beads was then slowly administered under fluoroscopic guidance via the microcatheter into accessory left hepatic arterial supply. Fluoroscopic loops were saved. The microcatheter and 5 French catheter were then removed. Oblique arteriography was performed at the level of right femoral artery access. Arteriotomy closure was performed with the Cordis Exoseal device. 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 After prior coil embolization, there is interval visualization and enlargement of a collateral branch off of the left accessory hepatic artery that now supplies some of the distal territory original supplied by the left gastric artery. This collateral trunk also appears to supply branches supplying the distal esophagus and GE junction. Given findings of collateral supply to the stomach and distal esophagus, chemoembolization was performed distal to this branch and near the previously defined bifurcation of the accessory left hepatic arterial trunk into 2 discrete dominant arterial trunks that supply the lateral segment of the left lobe of the liver and underlying left lobe hepatocellular carcinoma. The entire dose of LC beads loaded with 75 mg of doxorubicin was able to be slowly infused into arterial supply to the lateral segment of the left lobe. Fluoroscopy demonstrated slower flow during chemoembolization without stasis or reflux of contrast. Larger sized beads were utilized for this treatment due to the presence shunting to the lungs with injection of MAA previously. This should reduce the risk of pulmonary toxicity. IMPRESSION: Successful transcatheter chemoembolization utilizing drug-eluting beads (DEB-TACE) performed at the level of an accessory left hepatic artery supplying the lateral segment of the left lobe of the liver and known left lobe hepatocellular carcinoma. The patient was admitted for overnight observation. Clinical follow-up will be performed in approximately 3-4 weeks. Electronically Signed   By:  Aletta Edouard M.D.   On: 08/20/2017 17:13   Ir Angiogram Selective Each Additional Vessel  Result Date:  08/20/2017 INDICATION: Hepatocellular carcinoma of the left lobe of the liver. Prior planning arteriography and shunt calculation performed on 07/24/2017. Due to high percentage of pulmonary shunting, Yttrium-90 radioembolization was contraindicated. The patient now presents for planned chemoembolization of left lobe tumor with drug-eluting beads. Prior coil embolization of left gastric arterial supply off of an accessory left hepatic arterial trunk was performed on 07/24/2017. EXAM: 1. ULTRASOUND GUIDANCE FOR VASCULAR ACCESS OF THE RIGHT COMMON FEMORAL ARTERY 2. VISCERAL ARTERIOGRAPHY OF ACCESSORY LEFT HEPATIC ARTERY 3. ADDITIONAL SELECTIVE ARTERIOGRAPHY OF LATERAL SEGMENT LEFT HEPATIC ARTERIAL BRANCHES 4. TRANSCATHETER CHEMOEMBOLIZATION OF LEFT LOBE HEPATOCELLULAR CARCINOMA WITH DRUG-ELUTING BEADS MEDICATIONS: Zosyn 3.375 g IV, Protonix 40 mg IV, Toradol 50 mg IV and Zofran 8 mg IV ANESTHESIA/SEDATION: Moderate (conscious) sedation was employed during this procedure. A total of Versed 2.0 mg and Fentanyl 100 mcg was administered intravenously. Moderate Sedation Time: 44 minutes. The patient's level of consciousness and vital signs were monitored continuously by radiology nursing throughout the procedure under my direct supervision. CONTRAST:  50 mL Isovue-300 FLUOROSCOPY TIME:  Fluoroscopy Time: 13 minutes and 12 seconds. 430 mGy. COMPLICATIONS: None immediate. PROCEDURE: Informed consent was obtained from the patient following explanation of the procedure, risks, benefits and alternatives. The patient understands, agrees and consents for the procedure. All questions were addressed. A time out was performed prior to the initiation of the procedure. Maximal barrier sterile technique utilized including caps, mask, sterile gowns, sterile gloves, large sterile drape, hand hygiene, and chlorhexidine prep. Ultrasound was used to confirm patency of the right common femoral artery. The artery was then accessed under  direct ultrasound guidance with a micropuncture set. Ultrasound image documentation was performed. After access of the artery, a 5 French sheath was placed over a guidewire. A 5 French Cobra catheter was advanced into the abdominal aorta an used to selectively catheterize the celiac axis. A microcatheter was then introduced through the 5 French catheter and used to selectively catheterize an accessory left hepatic artery emanating from the celiac trunk. Selective arteriography was performed of the accessory left hepatic artery. The catheter was further advanced in the accessory left hepatic artery to the level of a distal bifurcation beyond an accessory gastric branch. Additional selective arteriography was performed. Prior to the procedure, a preparation of 1 vial of 300 -500 micron sized LC beads was loaded with 75 mg of doxorubicin by the pharmacy overnight. This preparation was obtained and diluted with saline and contrast. The entire dose of drug-eluting beads was then slowly administered under fluoroscopic guidance via the microcatheter into accessory left hepatic arterial supply. Fluoroscopic loops were saved. The microcatheter and 5 French catheter were then removed. Oblique arteriography was performed at the level of right femoral artery access. Arteriotomy closure was performed with the Cordis Exoseal device. 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 After prior coil embolization, there is interval visualization and enlargement of a collateral branch off of the left accessory hepatic artery that now supplies some of the distal territory original supplied by the left gastric artery. This collateral trunk  also appears to supply branches supplying the distal esophagus and GE junction.  Given findings of collateral supply to the stomach and distal esophagus, chemoembolization was performed distal to this branch and near the previously defined bifurcation of the accessory left hepatic arterial trunk into 2 discrete dominant arterial trunks that supply the lateral segment of the left lobe of the liver and underlying left lobe hepatocellular carcinoma. The entire dose of LC beads loaded with 75 mg of doxorubicin was able to be slowly infused into arterial supply to the lateral segment of the left lobe. Fluoroscopy demonstrated slower flow during chemoembolization without stasis or reflux of contrast. Larger sized beads were utilized for this treatment due to the presence shunting to the lungs with injection of MAA previously. This should reduce the risk of pulmonary toxicity. IMPRESSION: Successful transcatheter chemoembolization utilizing drug-eluting beads (DEB-TACE) performed at the level of an accessory left hepatic artery supplying the lateral segment of the left lobe of the liver and known left lobe hepatocellular carcinoma. The patient was admitted for overnight observation. Clinical follow-up will be performed in approximately 3-4 weeks. Electronically Signed   By: Aletta Edouard M.D.   On: 08/20/2017 17:13   Ir Embo Tumor Organ Ischemia Infarct Inc Guide Roadmapping  Result Date: 08/20/2017 INDICATION: Hepatocellular carcinoma of the left lobe of the liver. Prior planning arteriography and shunt calculation performed on 07/24/2017. Due to high percentage of pulmonary shunting, Yttrium-90 radioembolization was contraindicated. The patient now presents for planned chemoembolization of left lobe tumor with drug-eluting beads. Prior coil embolization of left gastric arterial supply off of an accessory left hepatic arterial trunk was performed on 07/24/2017. EXAM: 1. ULTRASOUND GUIDANCE FOR VASCULAR ACCESS OF THE  RIGHT COMMON FEMORAL ARTERY 2. VISCERAL ARTERIOGRAPHY OF ACCESSORY LEFT HEPATIC ARTERY 3. ADDITIONAL SELECTIVE ARTERIOGRAPHY OF LATERAL SEGMENT LEFT HEPATIC ARTERIAL BRANCHES 4. TRANSCATHETER CHEMOEMBOLIZATION OF LEFT LOBE HEPATOCELLULAR CARCINOMA WITH DRUG-ELUTING BEADS MEDICATIONS: Zosyn 3.375 g IV, Protonix 40 mg IV, Toradol 50 mg IV and Zofran 8 mg IV ANESTHESIA/SEDATION: Moderate (conscious) sedation was employed during this procedure. A total of Versed 2.0 mg and Fentanyl 100 mcg was administered intravenously. Moderate Sedation Time: 44 minutes. The patient's level of consciousness and vital signs were monitored continuously by radiology nursing throughout the procedure under my direct supervision. CONTRAST:  50 mL Isovue-300 FLUOROSCOPY TIME:  Fluoroscopy Time: 13 minutes and 12 seconds. 430 mGy. COMPLICATIONS: None immediate. PROCEDURE: Informed consent was obtained from the patient following explanation of the procedure, risks, benefits and alternatives. The patient understands, agrees and consents for the procedure. All questions were addressed. A time out was performed prior to the initiation of the procedure. Maximal barrier sterile technique utilized including caps, mask, sterile gowns, sterile gloves, large sterile drape, hand hygiene, and chlorhexidine prep. Ultrasound was used to confirm patency of the right common femoral artery. The artery was then accessed under direct ultrasound guidance with a micropuncture set. Ultrasound image documentation was performed. After access of the artery, a 5 French sheath was placed over a guidewire. A 5 French Cobra catheter was advanced into the abdominal aorta an used to selectively catheterize the celiac axis. A microcatheter was then introduced through the 5 French catheter and used to selectively catheterize an accessory left hepatic artery emanating from the celiac trunk. Selective arteriography was performed of the accessory left hepatic artery. The  catheter was further advanced in the accessory left hepatic artery to the level of a distal bifurcation beyond an accessory gastric branch. Additional selective arteriography was performed. Prior to the procedure, a preparation of 1 vial of 300 -500  micron sized LC beads was loaded with 75 mg of doxorubicin by the pharmacy overnight. This preparation was obtained and diluted with saline and contrast. The entire dose of drug-eluting beads was then slowly administered under fluoroscopic guidance via the microcatheter into accessory left hepatic arterial supply. Fluoroscopic loops were saved. The microcatheter and 5 French catheter were then removed. Oblique arteriography was performed at the level of right femoral artery access. Arteriotomy closure was performed with the Cordis Exoseal device. 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 After prior coil embolization, there is interval visualization and enlargement of a collateral branch off of the left accessory hepatic artery that now supplies some of the distal territory original supplied by the left gastric artery. This collateral trunk also appears to supply branches supplying the distal esophagus and GE junction. Given findings of collateral supply to the stomach and distal esophagus, chemoembolization was performed distal to this branch and near the previously defined bifurcation of the accessory left hepatic arterial trunk into 2 discrete dominant arterial trunks that supply the lateral segment of the left lobe of the liver and underlying left lobe hepatocellular carcinoma. The entire dose of LC beads loaded with 75 mg of doxorubicin was able to be slowly  infused into arterial supply to the lateral segment of the left lobe. Fluoroscopy demonstrated slower flow during chemoembolization without stasis or reflux of contrast. Larger sized beads were utilized for this treatment due to the presence shunting to the lungs with injection of MAA previously. This should reduce the risk of pulmonary toxicity. IMPRESSION: Successful transcatheter chemoembolization utilizing drug-eluting beads (DEB-TACE) performed at the level of an accessory left hepatic artery supplying the lateral segment of the left lobe of the liver and known left lobe hepatocellular carcinoma. The patient was admitted for overnight observation. Clinical follow-up will be performed in approximately 3-4 weeks. Electronically Signed   By: Aletta Edouard M.D.   On: 08/20/2017 17:13    Labs:  CBC:  Recent Labs  07/18/17 1028 07/24/17 0744 08/20/17 1210 08/22/17 0508  WBC 7.8 9.8 7.1 17.8*  HGB 13.4 14.5 12.3* 12.4*  HCT 40.1 43.1 37.2* 36.5*  PLT 165 217 123* 160    COAGS:  Recent Labs  04/13/17 1350 07/18/17 1028 07/24/17 0743 08/20/17 1210  INR 1.10 1.19 1.03 1.06  APTT 32 36  --  27    BMP:  Recent Labs  07/24/17 0744 07/31/17 0918 08/20/17 1210 08/22/17 0508  NA 139 140 139 129*  K 3.6 4.6 4.4 3.8  CL 102 107 106 99*  CO2 27 29 26 22   GLUCOSE 94 96 99 106*  BUN 22* 14 15 11   CALCIUM 9.3 9.1 8.7* 7.9*  CREATININE 0.75 0.78 0.68 0.74  GFRNONAA >60 >60 >60 >60  GFRAA >60 >60 >60 >60    LIVER FUNCTION TESTS:  Recent Labs  07/03/17 0830 07/24/17 0744 08/20/17 1210 08/22/17 0508  BILITOT 0.7 0.6 0.7 1.0  AST 108* 81* 66* 286*  ALT 93* 75* 48 167*  ALKPHOS 82 77 92 70  PROT 8.1 9.1* 8.1 6.8  ALBUMIN 3.4* 3.9 3.3* 2.9*    Assessment and Plan: Pt with hx lung cancer, left lobe HCC; s/p left hepatic DEB-TACE 9/10; temp 100.1, hgb stable at  12.4, WBC 17.8, Na 129, creat 0.74, albumin 2.9, t bili nl; AST/ALT elevated as expected; encourage  ambulation/reg diet; on IV Zosyn; will discuss above with Dr. Kathlene Cote   Electronically Signed: D. Rowe Robert, PA-C 08/22/2017, 9:58 AM   I spent a total of 15 minutes at the the patient's bedside AND on the patient's hospital floor or unit, greater than 50% of which was counseling/coordinating care for left hepatocellular carcinoma DEB-TACE    Patient ID: Gerald [name], male   DOB: Mar 30, 1951, 66 y.o.   MRN: [id number]

## 2017-08-22 NOTE — Progress Notes (Signed)
Discussed discharge instructions with patient and asked patient if he had any questions regarding his care and/ or his discharge and care at home. Patient had no further questions and expressed understanding with all of the information given to him.  Patient states he will be driving himself home. Patient has had something to eat, expresses on diziness or nausea, and was able to walk around the halls well.  Removed IV on his Right Forearm- catheter and IV site  Were clean, dry, and intact. VSS. Will continue to monitor patient until discharge.

## 2017-08-24 ENCOUNTER — Inpatient Hospital Stay: Payer: Medicare Other

## 2017-08-24 ENCOUNTER — Inpatient Hospital Stay: Payer: Medicare Other | Admitting: Hematology and Oncology

## 2017-09-07 ENCOUNTER — Other Ambulatory Visit: Payer: Self-pay | Admitting: *Deleted

## 2017-09-07 ENCOUNTER — Inpatient Hospital Stay: Payer: Medicare Other | Attending: Hematology and Oncology

## 2017-09-07 ENCOUNTER — Inpatient Hospital Stay (HOSPITAL_BASED_OUTPATIENT_CLINIC_OR_DEPARTMENT_OTHER): Payer: Medicare Other | Admitting: Hematology and Oncology

## 2017-09-07 VITALS — BP 123/75 | HR 70 | Temp 96.7°F | Resp 20 | Wt 130.4 lb

## 2017-09-07 DIAGNOSIS — Z808 Family history of malignant neoplasm of other organs or systems: Secondary | ICD-10-CM | POA: Diagnosis not present

## 2017-09-07 DIAGNOSIS — D721 Eosinophilia: Secondary | ICD-10-CM | POA: Diagnosis not present

## 2017-09-07 DIAGNOSIS — Z8701 Personal history of pneumonia (recurrent): Secondary | ICD-10-CM | POA: Insufficient documentation

## 2017-09-07 DIAGNOSIS — Z66 Do not resuscitate: Secondary | ICD-10-CM | POA: Insufficient documentation

## 2017-09-07 DIAGNOSIS — D72829 Elevated white blood cell count, unspecified: Secondary | ICD-10-CM | POA: Insufficient documentation

## 2017-09-07 DIAGNOSIS — Z7189 Other specified counseling: Secondary | ICD-10-CM

## 2017-09-07 DIAGNOSIS — I820 Budd-Chiari syndrome: Secondary | ICD-10-CM

## 2017-09-07 DIAGNOSIS — Z9221 Personal history of antineoplastic chemotherapy: Secondary | ICD-10-CM | POA: Insufficient documentation

## 2017-09-07 DIAGNOSIS — Z87891 Personal history of nicotine dependence: Secondary | ICD-10-CM | POA: Diagnosis not present

## 2017-09-07 DIAGNOSIS — C7951 Secondary malignant neoplasm of bone: Secondary | ICD-10-CM

## 2017-09-07 DIAGNOSIS — M069 Rheumatoid arthritis, unspecified: Secondary | ICD-10-CM

## 2017-09-07 DIAGNOSIS — R109 Unspecified abdominal pain: Secondary | ICD-10-CM | POA: Diagnosis not present

## 2017-09-07 DIAGNOSIS — Z801 Family history of malignant neoplasm of trachea, bronchus and lung: Secondary | ICD-10-CM

## 2017-09-07 DIAGNOSIS — C220 Liver cell carcinoma: Secondary | ICD-10-CM

## 2017-09-07 DIAGNOSIS — Z85118 Personal history of other malignant neoplasm of bronchus and lung: Secondary | ICD-10-CM

## 2017-09-07 DIAGNOSIS — I998 Other disorder of circulatory system: Secondary | ICD-10-CM | POA: Diagnosis not present

## 2017-09-07 DIAGNOSIS — R634 Abnormal weight loss: Secondary | ICD-10-CM | POA: Insufficient documentation

## 2017-09-07 DIAGNOSIS — C3411 Malignant neoplasm of upper lobe, right bronchus or lung: Secondary | ICD-10-CM | POA: Diagnosis not present

## 2017-09-07 DIAGNOSIS — Z923 Personal history of irradiation: Secondary | ICD-10-CM

## 2017-09-07 DIAGNOSIS — G893 Neoplasm related pain (acute) (chronic): Secondary | ICD-10-CM

## 2017-09-07 DIAGNOSIS — R5383 Other fatigue: Secondary | ICD-10-CM | POA: Diagnosis not present

## 2017-09-07 DIAGNOSIS — I81 Portal vein thrombosis: Secondary | ICD-10-CM

## 2017-09-07 DIAGNOSIS — Z87448 Personal history of other diseases of urinary system: Secondary | ICD-10-CM | POA: Insufficient documentation

## 2017-09-07 DIAGNOSIS — Z8619 Personal history of other infectious and parasitic diseases: Secondary | ICD-10-CM | POA: Diagnosis not present

## 2017-09-07 DIAGNOSIS — B1920 Unspecified viral hepatitis C without hepatic coma: Secondary | ICD-10-CM

## 2017-09-07 DIAGNOSIS — C787 Secondary malignant neoplasm of liver and intrahepatic bile duct: Secondary | ICD-10-CM

## 2017-09-07 LAB — COMPREHENSIVE METABOLIC PANEL
ALT: 31 U/L (ref 17–63)
AST: 53 U/L — ABNORMAL HIGH (ref 15–41)
Albumin: 3 g/dL — ABNORMAL LOW (ref 3.5–5.0)
Alkaline Phosphatase: 89 U/L (ref 38–126)
Anion gap: 7 (ref 5–15)
BUN: 10 mg/dL (ref 6–20)
CO2: 26 mmol/L (ref 22–32)
Calcium: 8.8 mg/dL — ABNORMAL LOW (ref 8.9–10.3)
Chloride: 102 mmol/L (ref 101–111)
Creatinine, Ser: 0.78 mg/dL (ref 0.61–1.24)
GFR calc Af Amer: >60 mL/min (ref >60)
GFR calc non Af Amer: >60 mL/min (ref >60)
Glucose, Bld: 104 mg/dL — ABNORMAL HIGH (ref 65–99)
Potassium: 4.4 mmol/L (ref 3.5–5.1)
Sodium: 135 mmol/L (ref 135–145)
Total Bilirubin: 0.9 mg/dL (ref 0.3–1.2)
Total Protein: 7.8 g/dL (ref 6.5–8.1)

## 2017-09-07 LAB — CBC WITH DIFFERENTIAL/PLATELET
Basophils Absolute: 0 10*3/uL (ref 0–0.1)
Basophils Relative: 1 %
Eosinophils Absolute: 0.2 10*3/uL (ref 0–0.7)
Eosinophils Relative: 3 %
HCT: 36.7 % — ABNORMAL LOW (ref 40.0–52.0)
Hemoglobin: 12.7 g/dL — ABNORMAL LOW (ref 13.0–18.0)
Lymphocytes Relative: 14 %
Lymphs Abs: 1 10*3/uL (ref 1.0–3.6)
MCH: 32.9 pg (ref 26.0–34.0)
MCHC: 34.5 g/dL (ref 32.0–36.0)
MCV: 95.3 fL (ref 80.0–100.0)
Monocytes Absolute: 1.5 10*3/uL — ABNORMAL HIGH (ref 0.2–1.0)
Monocytes Relative: 19 %
Neutro Abs: 4.9 10*3/uL (ref 1.4–6.5)
Neutrophils Relative %: 63 %
Platelets: 308 10*3/uL (ref 150–440)
RBC: 3.85 MIL/uL — ABNORMAL LOW (ref 4.40–5.90)
RDW: 14.3 % (ref 11.5–14.5)
WBC: 7.6 10*3/uL (ref 3.8–10.6)

## 2017-09-07 MED ORDER — OXYCODONE HCL 5 MG PO TABS
ORAL_TABLET | ORAL | 0 refills | Status: DC
Start: 2017-09-07 — End: 2017-10-15

## 2017-09-07 NOTE — Progress Notes (Signed)
[facility] day:  09/07/2017   Chief Complaint: Gerald [name] is a 66 y.o. male with stage IV adenocarcinoma of the lung and hepatocellular carcinoma who is seen for reassessment following chemo-embolization.  HPI:  The patient was last seen in the medical oncology clinic on 07/25/2017.  At that time, he had RUQ abdominal discomfort, slightly worse since recent liver biopsy. He denied any respiratory symptoms. We discussed management of his hepatocellular carcinoma with  Y-90 radioembolization.  He underwent planning arteriography on 07/24/2017. He had significant lung shunting of 31% on recent test Y 90 dosing which prevented further Y 90 treatment of left lobe HCC. He was deemed an appropriate candidate for hepatic chemoembolization of left lobe liver  He underwent left accessory hepatic arteriography with chemotherapy embolization of accessory hepatic artery with doxorubicin-eluting beads on 08/20/2017 by Dr. Aletta Edouard. Tumor was targeted in the lateral segment of the left lobe of the liver.  During the interim, patient notes that he "doesn't feel well at all".  Patient with fatigue. He is not eating well; no significant weight loss. He complains of abdominal pain when he eats. Patient feels like there is a "ball" sitting in the middle of his abdomen. Patient experiencing diffuse intermittent abdominal pain.  Pain has improved since time has past since 08/20/2017 procedure.  Patient is not active, citing that he doesn't feel like doing anything. Patient continues anticoagulation therapy.    Past Medical History:  Diagnosis Date  . Arthritis    Rheumatoid arthritis  . Cancer ([facility])    lung ca   . Collagen vascular disease (H[facility])    RA  . Dyspnea   . Hepatitis    b and c  . Mass of lung   . Pneumonia   . Pneumothorax    spontaneous  . Renal disorder    as a child    Past Surgical History:  Procedure Laterality Date  . FLEXIBLE  BRONCHOSCOPY N/A 03/30/2016   Procedure: FLEXIBLE BRONCHOSCOPY;  Surgeon: Vilinda Boehringer, MD;  Location: [facility] ORS;  Service: Cardiopulmonary;  Laterality: N/A;  . IR ANGIOGRAM SELECTIVE EACH ADDITIONAL VESSEL  07/24/2017  . IR ANGIOGRAM SELECTIVE EACH ADDITIONAL VESSEL  07/24/2017  . IR ANGIOGRAM SELECTIVE EACH ADDITIONAL VESSEL  07/24/2017  . IR ANGIOGRAM SELECTIVE EACH ADDITIONAL VESSEL  07/24/2017  . IR ANGIOGRAM SELECTIVE EACH ADDITIONAL VESSEL  07/24/2017  . IR ANGIOGRAM SELECTIVE EACH ADDITIONAL VESSEL  07/24/2017  . IR ANGIOGRAM SELECTIVE EACH ADDITIONAL VESSEL  07/24/2017  . IR ANGIOGRAM SELECTIVE EACH ADDITIONAL VESSEL  08/20/2017  . IR ANGIOGRAM VISCERAL SELECTIVE  07/24/2017  . IR ANGIOGRAM VISCERAL SELECTIVE  08/20/2017  . IR EMBO ARTERIAL NOT HEMORR HEMANG INC GUIDE ROADMAPPING  07/24/2017  . IR EMBO TUMOR ORGAN ISCHEMIA INFARCT INC GUIDE ROADMAPPING  08/20/2017  . IR US GUIDE VASC ACCESS RIGHT  07/24/2017  . Ureter repair as a child      Family History  Problem Relation Age of Onset  . Brain cancer Mother   . Lung cancer Maternal Uncle   . Cancer Sister        Metastatic cancer- unknown primary    Social History:  reports that he quit smoking about 17 years ago. His smoking use included Cigarettes. He has a 13.00 pack-year smoking history. He has never used smokeless tobacco. He reports that he drinks alcohol. He reports that he does not use drugs.  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  He lives in [place].  Contact number is [phone].  He has a great pyrenees.  He has been working on his truck.  The patient is alone today.  Allergies:  Allergies  Allergen Reactions  . Nsaids Other (See Comments)    Can take NSAIDs short term but cannot be on it long term d/t renal insuff  . Sulfa Antibiotics Other (See Comments)    Reaction:  GI upset     Current Medications: Current Outpatient Prescriptions  Medication Sig  Dispense Refill  . calcium-vitamin D (OSCAL WITH D) 500-200 MG-UNIT tablet Take 1 tablet by mouth 2 (two) times daily. 180 tablet 0  . enoxaparin (LOVENOX) 60 MG/0.6ML injection Inject 0.6 mLs (60 mg total) into the skin every 12 (twelve) hours. 60 Syringe 2  . folic acid (FOLVITE) 1 MG tablet Take 1 tablet (1 mg total) by mouth daily. 30 tablet 3  . oxyCODONE (OXY IR/ROXICODONE) 5 MG immediate release tablet Take 1-2 tablets every 4 hours as needed for pain 40 tablet 0   No current facility-administered medications for this visit.     Review of Systems:  GENERAL:  Feels "bad". Weak and fatigued. No fever, chills or sweats.  Weight down 1 pound. PERFORMANCE STATUS (ECOG):  2 HEENT: No visual changes, sore throat, mouth sores or tenderness. Lungs:  No shortness of breath or cough. No hemoptysis. Cardiac:  No chest pain, palpitations, orthopnea, or PND.  Sleeps in a recliner. GI:  Appetite fair.  Pain with eating.  Diffuse abdominal pain. No nausea, vomiting, diarrhea, constipation, melena or hematochezia. GU:  No urgency, frequency, dysuria, or hematuria. Musculoskeletal:  Chronic right knee pain. No back pain.  No muscle tenderness. Extremities:  No pain or swelling. Skin:  No rashes or ulcers. Neuro:  Neuropathy (stable) on gabapentin.  No headache, focal weakness, balance or coordination issues. Endocrine:  No diabetes, thyroid issues, hot flashes or night sweats. Psych:  No mood changes, depression or anxiety. Pain:  Diffuse abdominal pain (4 out of 10).  Review of systems:  All other systems reviewed and found to be negative.  Physical Exam: Blood pressure 123/75, pulse 70, temperature (!) 96.7 F (35.9 C), temperature source Tympanic, resp. rate 20, weight 130 lb 7 oz (59.2 kg). GENERAL:  Thin gentleman sitting comfortably in the exam room in no acute distress. MENTAL STATUS:  Alert and oriented to person, place and time.  HEAD:Wearing a cap. Short gray hair. Albertina Parr.  Temporal wasting. Normocephalic, atraumatic, face symmetric, no Cushingoid features. EYES:Blue eyes. Pupils equal round and reactive to light and accomodation. No conjunctivitis or scleral icterus. ZWC:HENIDPOEUM clear without lesion. Tonguenormal. Mucous membranes moist. RESPIRATORY:Clear to auscultationwithout rales, wheezes or rhonchi. CARDIOVASCULAR:Regular rate andrhythmwithout murmur, rub or gallop. ABDOMEN:Soft, tender over liver.  No guarding or rebound tenderness.  Active bowel sounds and no splenomegaly. No masses. SKIN: Dry.  No rashes. EXTREMITIES: No edema, no skin discoloration or tenderness. No palpable cords. LYMPHNODES: No palpable cervical, supraclavicular, axillary or inguinal adenopathy  NEUROLOGICAL: Unremarkable. PSYCH: Appropriate.    Imaging studies: 03/27/2016:  Chest CT angiogram revealed an 8 cm mass in the medial right upper lobe. The mass abutted the mediastinum and possibly invaded the left atrium. There was associated widespread thoracic adenopathy. There was a 2.6 cm enhancing lesion in the medial segment of the left hepatic lobe. 04/05/2016:  PET scan revealed intense hypermetabolism (SUV 17.95) associated with a 7.5 cm central perihilar right  lung lesion. There was mediastinal (sub-carinal node SUV 8.89; 2.6 cm right paratracheal node SUV 17.49) and upper abdominal retroperitoneal hypermetabolic adenopathy (9 mm aortocaval node SUV 9.2). There were 2 liver metastasis (2.3 cm left lobe SUV 6.19; 2.2 cm segment VIII SUV 5.18). There are multifocal bone metastasis (C2 vertebral body SUV 9.0; 1.6 cm left sacral ala lesion SUV of 8.6).  03/31/2016:  Head MRI was indeterminate for early metastatic disease to the brain. There was a small focus of gyral enhancement in the anterior right frontal lobe likely is post ischemic, while a small posterior right cerebellar enhancing lesion was more suspicious for small brain metastasis. There were  multiple small primarily subacute appearing infarcts in the bilateral MCA and left PICA territories.  04/07/2016:  Bone scan revealed a subtle distal right femur lesion which might be a small metastasis.  Bone scan was felt to be an unreliable modality for evaluation of osseous metastatic disease as the bone metastases seen on the recent PET and MRI were not evident. 05/12/2016:  Head MRI revealed expected interval evolution of bilateral cerebral in cerebellar infarcts. There was resolution of right frontal and right cerebellar enhancement consistent with subacute infarcts. There was a new 3 mm focus of gyral enhancement in the left occipital lobe and a possible new 4 mm enhancing lesion in the left cerebellum (? vascular enhancement versus tiny metastasis).  08/16/2016:  Head MRI revealed multiple areas of improving enhancement and restricted diffusion compatible with resolving subacute infarcts.  There was no evidence of metastatic disease or acute infarction  10/19/2016:  Chest, abdomen, and pelvic CT scan revealed radiation changes in the right lung.  The superimposed right lung pneumonia had improved.  The 6.4 x 2.4 cm right perihilar mass had decreased.  There was improving thoracic lymphadenopathy.  There was progression of hepatic metastases, measuring up to 4.7 cm.  Specifically, there was a 3.9 x 3.7 cm metastasis in segment 8 (previously 2.5 x 2.8 cm) and a 4.7 x 4.0 cm metastasis in segment 4B (previously 3.1 x 2.6 cm). 11/14/2016:  PET scan revealed 2 liver masses. Both were significantly more hypermetabolic than on 60/03/5996 and have enlarged compared to the prior PET-CT.  The segment 8 lesion was centrally necrotic but with high activity along its periphery (7.4), and about the same size as it was on 10/19/2016. The lateral segment left hepatic lobe lesion had enlarged (5.9 x 5.3 cm) compared to 10/19/16 (4.1 x 4.4 cm).  The bony metastatic lesions were no longer hypermetabolic and have  resolved.  There were post therapy and postoperative findings in the right lung, with a considerable amount of suspected radiation pneumonitis and radiation fibrosis. 12/29/2016:  Chest, abdomen, and pelvic CT revealed mild reduction in size and central necrosis of the hepatic metastatic lesions.  There was continued post therapy related findings along the right hilar region. Dominant right perihilar mass was reduced in thickness compared to 10/19/2016.  04/13/2017:  Chest, abdomen, and pelvic CT revealed new left hepatic vein thrombosis. This somewhat obscured the known left hepatic lobe tumor although overall the left hepatic lobe masses were thought to be similar in size compared the prior exam. The segment 8 lesion was less well seen than on the prior exam.  The central necrotic portion was seen but the peripheral rim was much less conspicuous (could represent improvement in this tumor).  There was mild increase in adenopathy in the gastrohepatic ligament.  There was essentially stable appearance in the lungs with considerable  radiation pneumonitis, scarring, and volume loss in the right lung, along with a trace loculated pleural effusion with enhancing margins.  There was stable appearance of prior splenic infarcts and prior scarring in the left mid kidney. 07/02/2017:  Chest, abdomen, and pelvic CT revealed interval increase in the left hepatic vein thrombosis with progressive heterogeneity and enhancement of the left hepatic lobe, predominantly obscuring the known left hepatic lobe lesion. There was a possible new 1.5 cm lateral left hepatic lobe lesion versus focal area of heterogeneity. There was slight interval decrease in the size of the irregular low-attenuation lesion within segment 8 of the liver. Right lung was stable in appearance with interval development of consolidation within the lingula representing atelectasis or infection. 07/05/2017:  Abdominal MRI revealed persistent mass within lateral  segment of left lobe of liver with progressive thrombosis of the left portal vein. There was also persistent thrombosis of the left hepatic vein. Portal vein thrombus extended up to the level of the main portal vein and was likely tumor thrombus. The liver has morphologic features suggestive of early cirrhosis. All findings are highly suggestive that the lesion in the left lobe of liver represents hepatoma rather than lung cancer metastasis. The lesion within segment 8 of the liver had decreased in size when compared with 04/13/2017.  The small lesion within segment 7 of the liver appeared new from previous exam. Indeterminate. This may represent an area of lung cancer metastasis or possibly multifocal [place]   Appointment on 09/07/2017  Component Date Value Ref Range Status  . WBC 09/07/2017 7.6  3.8 - 10.6 K/uL Final  . RBC 09/07/2017 3.85* 4.40 - 5.90 MIL/uL Final  . Hemoglobin 09/07/2017 12.7* 13.0 - 18.0 g/dL Final  . HCT 09/07/2017 36.7* 40.0 - 52.0 % Final  . MCV 09/07/2017 95.3  80.0 - 100.0 fL Final  . MCH 09/07/2017 32.9  26.0 - 34.0 pg Final  . MCHC 09/07/2017 34.5  32.0 - 36.0 g/dL Final  . RDW 09/07/2017 14.3  11.5 - 14.5 % Final  . Platelets 09/07/2017 308  150 - 440 K/uL Final  . Neutrophils Relative % 09/07/2017 63  % Final  . Neutro Abs 09/07/2017 4.9  1.4 - 6.5 K/uL Final  . Lymphocytes Relative 09/07/2017 14  % Final  . Lymphs Abs 09/07/2017 1.0  1.0 - 3.6 K/uL Final  . Monocytes Relative 09/07/2017 19  % Final  . Monocytes Absolute 09/07/2017 1.5* 0.2 - 1.0 K/uL Final  . Eosinophils Relative 09/07/2017 3  % Final  . Eosinophils Absolute 09/07/2017 0.2  0 - 0.7 K/uL Final  . Basophils Relative 09/07/2017 1  % Final  . Basophils Absolute 09/07/2017 0.0  0 - 0.1 K/uL Final  . Sodium 09/07/2017 135  135 - 145 mmol/L Final  . Potassium 09/07/2017 4.4  3.5 - 5.1 mmol/L Final  . Chloride 09/07/2017 102  101 - 111 mmol/L Final  . CO2 09/07/2017 26  22 - 32 mmol/L Final  .  Glucose, Bld 09/07/2017 104* 65 - 99 mg/dL Final  . BUN 09/07/2017 10  6 - 20 mg/dL Final  . Creatinine, Ser 09/07/2017 0.78  0.61 - 1.24 mg/dL Final  . Calcium 09/07/2017 8.8* 8.9 - 10.3 mg/dL Final  . Total Protein 09/07/2017 7.8  6.5 - 8.1 g/dL Final  . Albumin 09/07/2017 3.0* 3.5 - 5.0 g/dL Final  . AST 09/07/2017 53* 15 - 41 U/L Final  . ALT 09/07/2017 31  17 - 63 U/L Final  . Alkaline  Phosphatase 09/07/2017 89  38 - 126 U/L Final  . Total Bilirubin 09/07/2017 0.9  0.3 - 1.2 mg/dL Final  . GFR calc non Af Amer 09/07/2017 >60  >60 mL/min Final  . GFR calc Af Amer 09/07/2017 >60  >60 mL/min Final   Comment: (NOTE) The eGFR has been calculated using the CKD EPI equation. This calculation has not been validated in all clinical situations. eGFR's persistently <60 mL/min signify possible Chronic Kidney Disease.   . Anion gap 09/07/2017 7  5 - 15 Final    Assessment:  Gerald [name] is a 66 y.o. male with stage IV adenocarcinoma of the lung and associated leukocytosis with eosinophilia. He presented with a 30 pound weight loss, shortness of breath, cough, and left leg discomfort. He has a 13-15 pack year smoking history.  Bronchoscopy on 03/30/2016 revealed an endobronchial lesion in the RUL anterior segment with 95% occlusion. There was extrinsic compression in the right middle lobe secondary to posterior mass effect. Pathology confirmed non-small cell lung cancer, favor adenocarcinoma.  PD-L1 testing revealed high expression (> 50%).  EGFR, ALK, and ROS1 were negative.  CEA was 2.3 on 03/27/2016.  PET scan on 04/05/2016 revealed intense hypermetabolism (SUV 17.95) associated with a 7.5 cm central perihilar right lung lesion. There was mediastinal (sub-carinal node SUV 8.89; 2.6 cm right paratracheal node SUV 17.49) and upper abdominal retroperitoneal hypermetabolic adenopathy (9 mm aortocaval node SUV 9.2). There were 2 liver metastasis (2.3 cm left lobe SUV 6.19; 2.2 cm segment VIII SUV  5.18). There are multifocal bone metastasis (C2 vertebral body SUV 9.0; 1.6 cm left sacral ala lesion SUV of 8.6).   He had marked leukocytosis with hypereosinophilia. Initial WBC was 72,300 with 53% eosinophils. Bone marrow on 03/29/2016 revealed marked increase in marrow eosinophils (approximate 30%). There was no diagnostic morphologic evidence of a myeloproliferative or lymphoid neoplasm. Flow cytometry revealed significant increase in eosinophils (54%). There was relative decreased myeloid cells with no significant immunophenotypic abnormalities or increase in blasts. There was no lymphoid abnormalities or evidence of clonality. FISH studies were negative for myeloproliferative neoplasms with eosinophilia (PDGFRA, PDGFRB, FGFR1). BCR-ABL was negative on 03/28/2016.  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  Left lower extremity duplex on 03/25/2016 revealed no evidence of DVT. Discomfort improved on Neurontin 300 mg a day and continues to improve with declining eosinophilia.  He received 41.4 Gy from 04/11/2016 - 05/23/2016.  He received 4 weeks of concurrent carboplatin and Taxol (04/17/2016 - 05/22/2016).    He has received 15 cycles of Keytruda (07/03/2016 - 10/12/2016; 11/22/2016 - 06/05/2017).  He has tolerated treatment well.  He receives [place] every 6 weeks (began 05/22/2016; last 06/26/2017).  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  He was on Xarelto from 04/13/2017 - 07/24/2017. Lovenox injections were not started on 07/13/2017 as ordered; ordered again to start 07/25/2017.   He has increased liver function tests.  Etiology is possibly secondary to acute hepatitis, irritation due to thrombosis, or progressive disease (metastatic liver lesion or [place]).  He is hepatitis B core antibody and hepatitis C antibody positive.  Hepatitis C RNA was 5.474 log10 IU/ml (298,000 IU/ml) on 07/03/2017.  Hepatitis C  genotype is 1a.  AFP was 589.7 on 07/03/2017.  Chest, abdomen, and pelvic CT on 07/02/2017 revealed interval increase in the left hepatic vein thrombosis with progressive heterogeneity and enhancement of the left hepatic lobe, predominantly obscuring the known left hepatic  lobe lesion. There was a possible new 1.5 cm lateral left hepatic lobe lesion versus focal area of heterogeneity. There was slight interval decrease in the size of the irregular low-attenuation lesion within segment 8 of the liver. Right lung was stable in appearance with interval development of consolidation within the lingula representing atelectasis or infection.  Abdominal MRI on 07/05/2017 revealed persistent mass within lateral segment of left lobe of liver with progressive thrombosis of the left portal vein. There was also persistent thrombosis of the left hepatic vein. Portal vein thrombus extended up to the level of the main portal vein and was likely tumor thrombus. The liver has morphologic features suggestive of early cirrhosis. All findings are highly suggestive that the lesion in the left lobe of liver represents hepatoma rather than lung cancer metastasis. The lesion within segment 8 of the liver had decreased in size when compared with 04/13/2017.  The small lesion within segment 7 of the liver appeared new from previous exam. Indeterminate. This may represent an area of lung cancer metastasis or possibly multifocal HCC.  He underwent arteriography, embolization and shunt calculations on 07/24/2017 in preparation for Y-90 radioembolization of his liver.  He underwent left accessory hepatic arteriography with chemotherapy embolization of accessory hepatic artery with doxorubicin-eluting beads on 08/20/2017. Tumor was targeted in the lateral segment of the left lobe of the liver.  Code status is DNR/DNI.  Symptomatically, he notes fatigue. He does not feel well today. Patient is not eating well. He has abdominal pain,  improving since his 08/20/2017 procedure. He denies any respiratory symptoms.  Labs are stable.   Plan: 1.  Labs today:  CBC with diff, CMP,  AFP. 2.  Discuss interval chemo-embolization. 3.  Discuss restarting treatment of patient's lung cancer. Patient willing to restart Keytruda and Xgeva treatments. Will need interval PET scan to assess for disease progression. Patient was last treated 3 months ago. 4.  Pain diary reviewed. Patient often only using Roxicodone 22m x 1 tab a day. Will refill Rx: Roxicodone 532m- 1 to 2 tabs every 4 hours PRN (Disp #:40). Patient to continue to maintain a pain diary and bring with him next visit.  5.  Schedule restaging PET scan on 09/17/2017. 6.  RTC on 09/20/2017 for MD assessment, labs (CBC with diff, CMP, Mg, TSH), Xgeva, and cycle #16 Keytruda.   BrHonor LohNP  09/07/2017, 12:03 PM   I saw and evaluated the patient, participating in the key portions of the service and reviewing pertinent diagnostic studies and records.  I reviewed the nurse practitioner's note and agree with the findings and the plan.  The assessment and plan were discussed with the patient.  Additional diagnostic studies of PET scan are needed to clarify disease state and would change the clinical management.  Multiple questions were asked by the patient and answered.   MeLequita AsalMD 09/07/2017, 12:03 PM

## 2017-09-07 NOTE — Progress Notes (Signed)
Patient states since his procedure he is having a hard time being functional.  States before he was cooking, mowing, etc but now cannot do much of anything.  States he has no energy at all.  Appetite is decreased.  States he feels like he has a softball in his stomach.  Complains of constant pain in his lower abdomen.

## 2017-09-08 ENCOUNTER — Encounter: Payer: Self-pay | Admitting: Hematology and Oncology

## 2017-09-08 LAB — CEA: CEA: 7 ng/mL — ABNORMAL HIGH (ref 0.0–4.7)

## 2017-09-08 LAB — AFP TUMOR MARKER: AFP, Serum, Tumor Marker: 475.6 ng/mL — ABNORMAL HIGH (ref 0.0–8.3)

## 2017-09-17 ENCOUNTER — Ambulatory Visit
Admission: RE | Admit: 2017-09-17 | Discharge: 2017-09-17 | Disposition: A | Discharge status: Home / Self Care | Payer: Medicare Other | Source: Ambulatory Visit | Attending: Urgent Care | Admitting: Urgent Care

## 2017-09-17 DIAGNOSIS — J320 Chronic maxillary sinusitis: Secondary | ICD-10-CM | POA: Insufficient documentation

## 2017-09-17 DIAGNOSIS — I700 Atherosclerosis of aorta: Secondary | ICD-10-CM | POA: Insufficient documentation

## 2017-09-17 DIAGNOSIS — C7951 Secondary malignant neoplasm of bone: Secondary | ICD-10-CM | POA: Diagnosis not present

## 2017-09-17 DIAGNOSIS — C787 Secondary malignant neoplasm of liver and intrahepatic bile duct: Secondary | ICD-10-CM

## 2017-09-17 DIAGNOSIS — J432 Centrilobular emphysema: Secondary | ICD-10-CM | POA: Insufficient documentation

## 2017-09-17 DIAGNOSIS — J90 Pleural effusion, not elsewhere classified: Secondary | ICD-10-CM | POA: Insufficient documentation

## 2017-09-17 DIAGNOSIS — C220 Liver cell carcinoma: Secondary | ICD-10-CM

## 2017-09-17 DIAGNOSIS — I2510 Atherosclerotic heart disease of native coronary artery without angina pectoris: Secondary | ICD-10-CM | POA: Diagnosis not present

## 2017-09-17 DIAGNOSIS — C3411 Malignant neoplasm of upper lobe, right bronchus or lung: Secondary | ICD-10-CM | POA: Diagnosis not present

## 2017-09-17 DIAGNOSIS — I81 Portal vein thrombosis: Secondary | ICD-10-CM | POA: Insufficient documentation

## 2017-09-17 DIAGNOSIS — C3490 Malignant neoplasm of unspecified part of unspecified bronchus or lung: Secondary | ICD-10-CM | POA: Diagnosis not present

## 2017-09-17 LAB — GLUCOSE, CAPILLARY: Glucose-Capillary: 90 mg/dL (ref 65–99)

## 2017-09-17 MED ORDER — FLUDEOXYGLUCOSE F - 18 (FDG) INJECTION
12.0000 | Freq: Once | INTRAVENOUS | Status: AC | PRN
  Administered 2017-09-17: 12.97 via INTRAVENOUS

## 2017-09-20 ENCOUNTER — Inpatient Hospital Stay: Payer: Medicare Other | Attending: Hematology and Oncology

## 2017-09-20 ENCOUNTER — Telehealth: Payer: Self-pay | Admitting: Pharmacist

## 2017-09-20 ENCOUNTER — Inpatient Hospital Stay: Payer: Medicare Other

## 2017-09-20 ENCOUNTER — Inpatient Hospital Stay (HOSPITAL_BASED_OUTPATIENT_CLINIC_OR_DEPARTMENT_OTHER): Payer: Medicare Other | Admitting: Hematology and Oncology

## 2017-09-20 ENCOUNTER — Encounter: Payer: Self-pay | Admitting: Hematology and Oncology

## 2017-09-20 VITALS — BP 87/50 | HR 50 | Temp 96.9°F | Resp 22 | Wt 127.6 lb

## 2017-09-20 VITALS — BP 102/67 | HR 77

## 2017-09-20 DIAGNOSIS — Z79899 Other long term (current) drug therapy: Secondary | ICD-10-CM | POA: Diagnosis not present

## 2017-09-20 DIAGNOSIS — Z66 Do not resuscitate: Secondary | ICD-10-CM

## 2017-09-20 DIAGNOSIS — R634 Abnormal weight loss: Secondary | ICD-10-CM | POA: Diagnosis not present

## 2017-09-20 DIAGNOSIS — I998 Other disorder of circulatory system: Secondary | ICD-10-CM

## 2017-09-20 DIAGNOSIS — Z8701 Personal history of pneumonia (recurrent): Secondary | ICD-10-CM | POA: Insufficient documentation

## 2017-09-20 DIAGNOSIS — C7951 Secondary malignant neoplasm of bone: Secondary | ICD-10-CM

## 2017-09-20 DIAGNOSIS — D721 Eosinophilia: Secondary | ICD-10-CM | POA: Diagnosis not present

## 2017-09-20 DIAGNOSIS — I951 Orthostatic hypotension: Secondary | ICD-10-CM

## 2017-09-20 DIAGNOSIS — C787 Secondary malignant neoplasm of liver and intrahepatic bile duct: Secondary | ICD-10-CM

## 2017-09-20 DIAGNOSIS — C220 Liver cell carcinoma: Secondary | ICD-10-CM | POA: Insufficient documentation

## 2017-09-20 DIAGNOSIS — I820 Budd-Chiari syndrome: Secondary | ICD-10-CM

## 2017-09-20 DIAGNOSIS — Z87891 Personal history of nicotine dependence: Secondary | ICD-10-CM

## 2017-09-20 DIAGNOSIS — Z8619 Personal history of other infectious and parasitic diseases: Secondary | ICD-10-CM | POA: Diagnosis not present

## 2017-09-20 DIAGNOSIS — Z86718 Personal history of other venous thrombosis and embolism: Secondary | ICD-10-CM

## 2017-09-20 DIAGNOSIS — Z9221 Personal history of antineoplastic chemotherapy: Secondary | ICD-10-CM | POA: Diagnosis not present

## 2017-09-20 DIAGNOSIS — R0602 Shortness of breath: Secondary | ICD-10-CM

## 2017-09-20 DIAGNOSIS — Z85118 Personal history of other malignant neoplasm of bronchus and lung: Secondary | ICD-10-CM

## 2017-09-20 DIAGNOSIS — Z7189 Other specified counseling: Secondary | ICD-10-CM

## 2017-09-20 DIAGNOSIS — R7989 Other specified abnormal findings of blood chemistry: Secondary | ICD-10-CM | POA: Insufficient documentation

## 2017-09-20 DIAGNOSIS — C3411 Malignant neoplasm of upper lobe, right bronchus or lung: Secondary | ICD-10-CM | POA: Diagnosis not present

## 2017-09-20 DIAGNOSIS — M069 Rheumatoid arthritis, unspecified: Secondary | ICD-10-CM | POA: Insufficient documentation

## 2017-09-20 DIAGNOSIS — I959 Hypotension, unspecified: Secondary | ICD-10-CM | POA: Diagnosis not present

## 2017-09-20 DIAGNOSIS — I81 Portal vein thrombosis: Secondary | ICD-10-CM

## 2017-09-20 LAB — CBC WITH DIFFERENTIAL/PLATELET
Basophils Absolute: 0.1 10*3/uL (ref 0–0.1)
Basophils Relative: 1 %
Eosinophils Absolute: 0.2 10*3/uL (ref 0–0.7)
Eosinophils Relative: 4 %
HCT: 38.9 % — ABNORMAL LOW (ref 40.0–52.0)
Hemoglobin: 13.2 g/dL (ref 13.0–18.0)
Lymphocytes Relative: 16 %
Lymphs Abs: 1.1 10*3/uL (ref 1.0–3.6)
MCH: 32.9 pg (ref 26.0–34.0)
MCHC: 34 g/dL (ref 32.0–36.0)
MCV: 96.8 fL (ref 80.0–100.0)
Monocytes Absolute: 0.9 10*3/uL (ref 0.2–1.0)
Monocytes Relative: 13 %
Neutro Abs: 4.4 10*3/uL (ref 1.4–6.5)
Neutrophils Relative %: 66 %
Platelets: 241 10*3/uL (ref 150–440)
RBC: 4.02 MIL/uL — ABNORMAL LOW (ref 4.40–5.90)
RDW: 14.4 % (ref 11.5–14.5)
WBC: 6.8 10*3/uL (ref 3.8–10.6)

## 2017-09-20 LAB — COMPREHENSIVE METABOLIC PANEL
ALT: 28 U/L (ref 17–63)
AST: 58 U/L — ABNORMAL HIGH (ref 15–41)
Albumin: 2.9 g/dL — ABNORMAL LOW (ref 3.5–5.0)
Alkaline Phosphatase: 88 U/L (ref 38–126)
Anion gap: 11 (ref 5–15)
BUN: 10 mg/dL (ref 6–20)
CO2: 24 mmol/L (ref 22–32)
Calcium: 8.7 mg/dL — ABNORMAL LOW (ref 8.9–10.3)
Chloride: 103 mmol/L (ref 101–111)
Creatinine, Ser: 0.77 mg/dL (ref 0.61–1.24)
GFR calc Af Amer: >60 mL/min (ref >60)
GFR calc non Af Amer: >60 mL/min (ref >60)
Glucose, Bld: 125 mg/dL — ABNORMAL HIGH (ref 65–99)
Potassium: 4.1 mmol/L (ref 3.5–5.1)
Sodium: 138 mmol/L (ref 135–145)
Total Bilirubin: 0.9 mg/dL (ref 0.3–1.2)
Total Protein: 7.6 g/dL (ref 6.5–8.1)

## 2017-09-20 LAB — MAGNESIUM: Magnesium: 1.6 mg/dL — ABNORMAL LOW (ref 1.7–2.4)

## 2017-09-20 MED ORDER — LENVATINIB (8 MG DAILY DOSE) 2 X 4 MG PO CPPK: 8.0000 mg | ORAL_CAPSULE | Freq: Every day | ORAL | 0 refills | Status: DC

## 2017-09-20 MED ORDER — MAGNESIUM SULFATE 50 % IJ SOLN
1.0000 g | Freq: Once | INTRAVENOUS | Status: DC
  Filled 2017-09-20: qty 2

## 2017-09-20 MED ORDER — SODIUM CHLORIDE 0.9 % IV SOLN
Freq: Once | INTRAVENOUS | Status: AC
  Administered 2017-09-20: 10:00:00 via INTRAVENOUS
  Filled 2017-09-20: qty 1000

## 2017-09-20 MED ORDER — SODIUM CHLORIDE 0.9 % IV SOLN
1.0000 g | Freq: Once | INTRAVENOUS | Status: AC
  Administered 2017-09-20: 1 g via INTRAVENOUS
  Filled 2017-09-20: qty 2

## 2017-09-20 NOTE — Progress Notes (Signed)
[facility] day:  09/20/2017   Chief Complaint: Kassidy Frankson is a 66 y.o. male with stage IV adenocarcinoma of the lung and hepatocellular carcinoma who is seen for 2 week assessment following chemo-embolization.  HPI:  The patient was last seen in the medical oncology clinic on 09/07/2017.  At that time, he felt fatigued.  He was not eating well. He had abdominal pain, improving since his 08/20/2017 chemo-embolization procedure. He denied any respiratory symptoms.  Labs were stable.  PET scan on 09/17/2017 revealed the original 2 lesions shown on the prior exam are less cohesivly visible, there were new spreading confluent lesions in segments 1, 2, 3, 4, 5, and 8 of the liver compatible with progressive multifocal hepatic malignancy. 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  There was post therapy related findings in the lungs without active pulmonary or osseous metastatic disease identified.  Symptomatically, patient has been doing "ok". Patient has chronic shortness of breath. He had some diarrhea during the weekend that resolved with the use of Loperamide. Patient denies nausea and vomiting. He has not been experiencing any vertiginous symptoms. Patient eating "ok". He has lost 3 pounds since his last visit. He denies any interval infections. Patient presents to the clinic today HYPOtensive. There are positive orthostatic changes noted. Blood pressure 87/50 sitting and 70/39 standing.    Past Medical History:  Diagnosis Date  . Arthritis    Rheumatoid arthritis  . Cancer ([place])    lung ca   . Collagen vascular disease (HCC)    RA  . Dyspnea   . Hepatitis    b and c  . Mass of lung   . Pneumonia   . Pneumothorax    spontaneous  . Renal disorder    as a child    Past Surgical History:  Procedure Laterality Date  . FLEXIBLE BRONCHOSCOPY N/A 03/30/2016   Procedure: FLEXIBLE BRONCHOSCOPY;   Surgeon: Vilinda Boehringer, MD;  Location: [facility] ORS;  Service: Cardiopulmonary;  Laterality: N/A;  . IR ANGIOGRAM SELECTIVE EACH ADDITIONAL VESSEL  07/24/2017  . IR ANGIOGRAM SELECTIVE EACH ADDITIONAL VESSEL  07/24/2017  . IR ANGIOGRAM SELECTIVE EACH ADDITIONAL VESSEL  07/24/2017  . IR ANGIOGRAM SELECTIVE EACH ADDITIONAL VESSEL  07/24/2017  . IR ANGIOGRAM SELECTIVE EACH ADDITIONAL VESSEL  07/24/2017  . IR ANGIOGRAM SELECTIVE EACH ADDITIONAL VESSEL  07/24/2017  . IR ANGIOGRAM SELECTIVE EACH ADDITIONAL VESSEL  07/24/2017  . IR ANGIOGRAM SELECTIVE EACH ADDITIONAL VESSEL  08/20/2017  . IR ANGIOGRAM VISCERAL SELECTIVE  07/24/2017  . IR ANGIOGRAM VISCERAL SELECTIVE  08/20/2017  . IR EMBO ARTERIAL NOT HEMORR HEMANG INC GUIDE ROADMAPPING  07/24/2017  . IR EMBO TUMOR ORGAN ISCHEMIA INFARCT INC GUIDE ROADMAPPING  08/20/2017  . IR US GUIDE VASC ACCESS RIGHT  07/24/2017  . Ureter repair as a child      Family History  Problem Relation Age of Onset  . Brain cancer Mother   . Lung cancer Maternal Uncle   . Cancer Sister        Metastatic cancer- unknown primary    Social History:  reports that he quit smoking about 17 years ago. His smoking use included Cigarettes. He has a 13.00 pack-year smoking history. He has never used smokeless tobacco. He reports that he drinks alcohol. He reports that he does not use drugs.  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  He was exposed to chemicals  and asbestos.  He lives in [place].  Contact number is [phone].  He has a great pyrenees.  He has been working on his truck.  The patient is alone today.  Allergies:  Allergies  Allergen Reactions  . Nsaids Other (See Comments)    Can take NSAIDs short term but cannot be on it long term d/t renal insuff  . Sulfa Antibiotics Other (See Comments)    Reaction:  GI upset     Current Medications: Current Outpatient Prescriptions  Medication Sig Dispense Refill  . calcium-vitamin D (OSCAL WITH D) 500-200 MG-UNIT  tablet Take 1 tablet by mouth 2 (two) times daily. 180 tablet 0  . enoxaparin (LOVENOX) 60 MG/0.6ML injection Inject 0.6 mLs (60 mg total) into the skin every 12 (twelve) hours. 60 Syringe 2  . oxyCODONE (OXY IR/ROXICODONE) 5 MG immediate release tablet Take 1-2 tablets every 4 hours as needed for pain 40 tablet 0  . folic acid (FOLVITE) 1 MG tablet Take 1 tablet (1 mg total) by mouth daily. (Patient not taking: Reported on 09/20/2017) 30 tablet 3   No current facility-administered medications for this visit.     Review of Systems:  GENERAL:  Feels "ok". No fever, chills or sweats.  Weight down 3 pounds. PERFORMANCE STATUS (ECOG):  2 HEENT: No visual changes, sore throat, mouth sores or tenderness. Lungs:  Chronic shortness of breath or cough. No hemoptysis. Cardiac:  No chest pain, palpitations, orthopnea, or PND.  Sleeps in a recliner. GI:  Appetite fair.  No nausea, vomiting, diarrhea, constipation, melena or hematochezia. GU:  No urgency, frequency, dysuria, or hematuria. Musculoskeletal:  Chronic right knee pain. No back pain.  No muscle tenderness. Extremities:  No pain or swelling. Skin:  No rashes or ulcers. Neuro:  Neuropathy (stable) on gabapentin.  No headache, focal weakness, balance or coordination issues. Endocrine:  No diabetes, thyroid issues, hot flashes or night sweats. Psych:  No mood changes, depression or anxiety. Pain:  Diffuse abdominal pain (4 out of 10).  Review of systems:  All other systems reviewed and found to be negative.  Physical Exam: Blood pressure (!) 87/50, pulse (!) 50, temperature (!) 96.9 F (36.1 C), temperature source Tympanic, resp. rate (!) 22, weight 127 lb 9 oz (57.9 kg). GENERAL:  Thin gentleman sitting comfortably in the exam room in no acute distress. MENTAL STATUS:  Alert and oriented to person, place and time.  HEAD:Wearing a cap. Short gray hair. Albertina Parr. Temporal wasting. Normocephalic, atraumatic, face symmetric, no  Cushingoid features. EYES:Blue eyes. Pupils equal round and reactive to light and accomodation. No conjunctivitis or scleral icterus. NLZ:JQBHALPFXT clear without lesion. Tonguenormal. Mucous membranes dry. RESPIRATORY:Clear to auscultationwithout rales, wheezes or rhonchi. CARDIOVASCULAR:Regular rate andrhythmwithout murmur, rub or gallop. ABDOMEN:Soft, slightly tender over liver.  No guarding or rebound tenderness.  Active bowel sounds and no splenomegaly. No masses. SKIN: Dry.  No rashes. EXTREMITIES: No edema, no skin discoloration or tenderness. No palpable cords. LYMPHNODES: No palpable cervical, supraclavicular, axillary or inguinal adenopathy  NEUROLOGICAL: Unremarkable. PSYCH: Appropriate.    Imaging studies: 03/27/2016:  Chest CT angiogram revealed an 8 cm mass in the medial right upper lobe. The mass abutted the mediastinum and possibly invaded the left atrium. There was associated widespread thoracic adenopathy. There was a 2.6 cm enhancing lesion in the medial segment of the left hepatic lobe. 04/05/2016:  PET scan revealed intense hypermetabolism (SUV 17.95) associated with a 7.5 cm central perihilar right lung lesion. There was mediastinal (sub-carinal node  SUV 8.89; 2.6 cm right paratracheal node SUV 17.49) and upper abdominal retroperitoneal hypermetabolic adenopathy (9 mm aortocaval node SUV 9.2). There were 2 liver metastasis (2.3 cm left lobe SUV 6.19; 2.2 cm segment VIII SUV 5.18). There are multifocal bone metastasis (C2 vertebral body SUV 9.0; 1.6 cm left sacral ala lesion SUV of 8.6).  03/31/2016:  Head MRI was indeterminate for early metastatic disease to the brain. There was a small focus of gyral enhancement in the anterior right frontal lobe likely is post ischemic, while a small posterior right cerebellar enhancing lesion was more suspicious for small brain metastasis. There were multiple small primarily subacute appearing infarcts in the  bilateral MCA and left PICA territories.  04/07/2016:  Bone scan revealed a subtle distal right femur lesion which might be a small metastasis.  Bone scan was felt to be an unreliable modality for evaluation of osseous metastatic disease as the bone metastases seen on the recent PET and MRI were not evident. 05/12/2016:  Head MRI revealed expected interval evolution of bilateral cerebral in cerebellar infarcts. There was resolution of right frontal and right cerebellar enhancement consistent with subacute infarcts. There was a new 3 mm focus of gyral enhancement in the left occipital lobe and a possible new 4 mm enhancing lesion in the left cerebellum (? vascular enhancement versus tiny metastasis).  08/16/2016:  Head MRI revealed multiple areas of improving enhancement and restricted diffusion compatible with resolving subacute infarcts.  There was no evidence of metastatic disease or acute infarction  10/19/2016:  Chest, abdomen, and pelvic CT scan revealed radiation changes in the right lung.  The superimposed right lung pneumonia had improved.  The 6.4 x 2.4 cm right perihilar mass had decreased.  There was improving thoracic lymphadenopathy.  There was progression of hepatic metastases, measuring up to 4.7 cm.  Specifically, there was a 3.9 x 3.7 cm metastasis in segment 8 (previously 2.5 x 2.8 cm) and a 4.7 x 4.0 cm metastasis in segment 4B (previously 3.1 x 2.6 cm). 11/14/2016:  PET scan revealed 2 liver masses. Both were significantly more hypermetabolic than on 54/00/8676 and have enlarged compared to the prior PET-CT.  The segment 8 lesion was centrally necrotic but with high activity along its periphery (7.4), and about the same size as it was on 10/19/2016. The lateral segment left hepatic lobe lesion had enlarged (5.9 x 5.3 cm) compared to 10/19/16 (4.1 x 4.4 cm).  The bony metastatic lesions were no longer hypermetabolic and have resolved.  There were post therapy and postoperative findings in  the right lung, with a considerable amount of suspected radiation pneumonitis and radiation fibrosis. 12/29/2016:  Chest, abdomen, and pelvic CT revealed mild reduction in size and central necrosis of the hepatic metastatic lesions.  There was continued post therapy related findings along the right hilar region. Dominant right perihilar mass was reduced in thickness compared to 10/19/2016.  04/13/2017:  Chest, abdomen, and pelvic CT revealed new left hepatic vein thrombosis. This somewhat obscured the known left hepatic lobe tumor although overall the left hepatic lobe masses were thought to be similar in size compared the prior exam. The segment 8 lesion was less well seen than on the prior exam.  The central necrotic portion was seen but the peripheral rim was much less conspicuous (could represent improvement in this tumor).  There was mild increase in adenopathy in the gastrohepatic ligament.  There was essentially stable appearance in the lungs with considerable radiation pneumonitis, scarring, and volume loss in  the right lung, along with a trace loculated pleural effusion with enhancing margins.  There was stable appearance of prior splenic infarcts and prior scarring in the left mid kidney. 07/02/2017:  Chest, abdomen, and pelvic CT revealed interval increase in the left hepatic vein thrombosis with progressive heterogeneity and enhancement of the left hepatic lobe, predominantly obscuring the known left hepatic lobe lesion. There was a possible new 1.5 cm lateral left hepatic lobe lesion versus focal area of heterogeneity. There was slight interval decrease in the size of the irregular low-attenuation lesion within segment 8 of the liver. Right lung was stable in appearance with interval development of consolidation within the lingula representing atelectasis or infection. 07/05/2017:  Abdominal MRI revealed persistent mass within lateral segment of left lobe of liver with progressive thrombosis of the  left portal vein. There was also persistent thrombosis of the left hepatic vein. Portal vein thrombus extended up to the level of the main portal vein and was likely tumor thrombus. The liver has morphologic features suggestive of early cirrhosis. All findings are highly suggestive that the lesion in the left lobe of liver represents hepatoma rather than lung cancer metastasis. The lesion within segment 8 of the liver had decreased in size when compared with 04/13/2017.  The small lesion within segment 7 of the liver appeared new from previous exam. Indeterminate. This may represent an area of lung cancer metastasis or possibly multifocal [place] 09/17/2017:  PET scan revealed the original 2 lesions shown on the prior exam are less cohesivly visible, there were new spreading confluent lesions in segments 1, 2, 3, 4, 5, and 8 of the liver compatible with progressive multifocal hepatic malignancy. 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  There was post therapy related findings in the lungs without active pulmonary or osseous metastatic disease identified.   Appointment on 09/20/2017  Component Date Value Ref Range Status  . WBC 09/20/2017 6.8  3.8 - 10.6 K/uL Final  . RBC 09/20/2017 4.02* 4.40 - 5.90 MIL/uL Final  . Hemoglobin 09/20/2017 13.2  13.0 - 18.0 g/dL Final  . HCT 09/20/2017 38.9* 40.0 - 52.0 % Final  . MCV 09/20/2017 96.8  80.0 - 100.0 fL Final  . MCH 09/20/2017 32.9  26.0 - 34.0 pg Final  . MCHC 09/20/2017 34.0  32.0 - 36.0 g/dL Final  . RDW 09/20/2017 14.4  11.5 - 14.5 % Final  . Platelets 09/20/2017 241  150 - 440 K/uL Final  . Neutrophils Relative % 09/20/2017 66  % Final  . Neutro Abs 09/20/2017 4.4  1.4 - 6.5 K/uL Final  . Lymphocytes Relative 09/20/2017 16  % Final  . Lymphs Abs 09/20/2017 1.1  1.0 - 3.6 K/uL Final  . Monocytes Relative 09/20/2017 13  % Final  . Monocytes Absolute 09/20/2017 0.9  0.2 - 1.0 K/uL Final  . Eosinophils  Relative 09/20/2017 4  % Final  . Eosinophils Absolute 09/20/2017 0.2  0 - 0.7 K/uL Final  . Basophils Relative 09/20/2017 1  % Final  . Basophils Absolute 09/20/2017 0.1  0 - 0.1 K/uL Final  . Sodium 09/20/2017 138  135 - 145 mmol/L Final  . Potassium 09/20/2017 4.1  3.5 - 5.1 mmol/L Final  . Chloride 09/20/2017 103  101 - 111 mmol/L Final  . CO2 09/20/2017 24  22 - 32 mmol/L Final  . Glucose, Bld 09/20/2017 125* 65 - 99 mg/dL Final  . BUN 09/20/2017 10  6 - 20 mg/dL  Final  . Creatinine, Ser 09/20/2017 0.77  0.61 - 1.24 mg/dL Final  . Calcium 09/20/2017 8.7* 8.9 - 10.3 mg/dL Final  . Total Protein 09/20/2017 7.6  6.5 - 8.1 g/dL Final  . Albumin 09/20/2017 2.9* 3.5 - 5.0 g/dL Final  . AST 09/20/2017 58* 15 - 41 U/L Final  . ALT 09/20/2017 28  17 - 63 U/L Final  . Alkaline Phosphatase 09/20/2017 88  38 - 126 U/L Final  . Total Bilirubin 09/20/2017 0.9  0.3 - 1.2 mg/dL Final  . GFR calc non Af Amer 09/20/2017 >60  >60 mL/min Final  . GFR calc Af Amer 09/20/2017 >60  >60 mL/min Final   Comment: (NOTE) The eGFR has been calculated using the CKD EPI equation. This calculation has not been validated in all clinical situations. eGFR's persistently <60 mL/min signify possible Chronic Kidney Disease.   . Anion gap 09/20/2017 11  5 - 15 Final  . Magnesium 09/20/2017 1.6* 1.7 - 2.4 mg/dL Final    Assessment:  Gerald [name] is a 67 y.o. male with stage IV adenocarcinoma of the lung and associated leukocytosis with eosinophilia. He presented with a 30 pound weight loss, shortness of breath, cough, and left leg discomfort. He has a 13-15 pack year smoking history.  Bronchoscopy on 03/30/2016 revealed an endobronchial lesion in the RUL anterior segment with 95% occlusion. There was extrinsic compression in the right middle lobe secondary to posterior mass effect. Pathology confirmed non-small cell lung cancer, favor adenocarcinoma.  PD-L1 testing revealed high expression (> 50%).  EGFR, ALK,  and ROS1 were negative.  CEA was 2.3 on 03/27/2016.  PET scan on 04/05/2016 revealed intense hypermetabolism (SUV 17.95) associated with a 7.5 cm central perihilar right lung lesion. There was mediastinal (sub-carinal node SUV 8.89; 2.6 cm right paratracheal node SUV 17.49) and upper abdominal retroperitoneal hypermetabolic adenopathy (9 mm aortocaval node SUV 9.2). There were 2 liver metastasis (2.3 cm left lobe SUV 6.19; 2.2 cm segment VIII SUV 5.18). There are multifocal bone metastasis (C2 vertebral body SUV 9.0; 1.6 cm left sacral ala lesion SUV of 8.6).   He had marked leukocytosis with hypereosinophilia. Initial WBC was 72,300 with 53% eosinophils. Bone marrow on 03/29/2016 revealed marked increase in marrow eosinophils (approximate 30%). There was no diagnostic morphologic evidence of a myeloproliferative or lymphoid neoplasm. Flow cytometry revealed significant increase in eosinophils (54%). There was relative decreased myeloid cells with no significant immunophenotypic abnormalities or increase in blasts. There was no lymphoid abnormalities or evidence of clonality. FISH studies were negative for myeloproliferative neoplasms with eosinophilia (PDGFRA, PDGFRB, FGFR1). BCR-ABL was negative on 03/28/2016.  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  Left lower extremity duplex on 03/25/2016 revealed no evidence of DVT. Discomfort improved on Neurontin 300 mg a day and continues to improve with declining eosinophilia.  He received 41.4 Gy from 04/11/2016 - 05/23/2016.  He received 4 weeks of concurrent carboplatin and Taxol (04/17/2016 - 05/22/2016).    He has received 15 cycles of Keytruda (07/03/2016 - 10/12/2016; 11/22/2016 - 06/05/2017).  He has tolerated treatment well.  He receives [place] every 6 weeks (began 05/22/2016; last 06/26/2017).  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  He was on Xarelto from  04/13/2017 - 07/24/2017. Lovenox injections were not started on 07/13/2017 as ordered; ordered again to start 07/25/2017.   He has increased liver function tests.  Etiology is possibly secondary to acute hepatitis, irritation due  to thrombosis, or progressive disease (metastatic liver lesion or [place]).  He is hepatitis B core antibody and hepatitis C antibody positive.  Hepatitis C RNA was 5.474 log10 IU/ml (298,000 IU/ml) on 07/03/2017.  Hepatitis C genotype is 1a.  AFP was 589.7 on 07/03/2017.  Chest, abdomen, and pelvic CT on 07/02/2017 revealed interval increase in the left hepatic vein thrombosis with progressive heterogeneity and enhancement of the left hepatic lobe, predominantly obscuring the known left hepatic lobe lesion. There was a possible new 1.5 cm lateral left hepatic lobe lesion versus focal area of heterogeneity. There was slight interval decrease in the size of the irregular low-attenuation lesion within segment 8 of the liver. Right lung was stable in appearance with interval development of consolidation within the lingula representing atelectasis or infection.  Abdominal MRI on 07/05/2017 revealed persistent mass within lateral segment of left lobe of liver with progressive thrombosis of the left portal vein. There was also persistent thrombosis of the left hepatic vein. Portal vein thrombus extended up to the level of the main portal vein and was likely tumor thrombus. The liver has morphologic features suggestive of early cirrhosis. All findings are highly suggestive that the lesion in the left lobe of liver represents hepatoma rather than lung cancer metastasis. The lesion within segment 8 of the liver had decreased in size when compared with 04/13/2017.  The small lesion within segment 7 of the liver appeared new from previous exam. Indeterminate. This may represent an area of lung cancer metastasis or possibly multifocal HCC.  Ultrasound guided liver biopsy on 07/18/2017 revealed  carcinoma c/w hepatocellular carcinoma.  He underwent arteriography, embolization and shunt calculations on 07/24/2017 in preparation for Y-90 radioembolization of his liver.  He underwent left accessory hepatic arteriography with chemotherapy embolization of accessory hepatic artery with doxorubicin-eluting beads on 08/20/2017. Tumor was targeted in the lateral segment of the left lobe of the liver.  AFP has been followed: 589.7 on 07/03/2017, 819.2 on 08/20/2017, and 475.6 on 09/07/2017.  PET scan on 09/17/2017 revealed the original 2 lesions shown on the prior exam are less cohesivly visible, there were new spreading confluent lesions in segments 1, 2, 3, 4, 5, and 8 of the liver compatible with progressive multifocal hepatic malignancy. 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  There was post therapy related findings in the lungs without active pulmonary or osseous metastatic disease identified.  Code status is DNR/DNI.  Symptomatically, he reports that he feels "ok". He had some diarrhea over the weekend; resolved. Patient HYPOtensive, with orthostatic changes. Patient is trying to eat better, however he has lost 3 more pounds. He has chronic shortness of breath. WBC 6.8 with an [place] of 4400. Hemoglobin 13.2, hematocrit 38.9, platelets 241,000.   Plan: 1.  Labs today:  CBC with diff, CMP,  AFP.  2.  Review PET scan- progressive liver changes c/w progressive multi-focal hepatocellular malignancy.  Discuss review in tumor board.  Discuss .  Consider options including sorafenib, lenvatinib, regorafenib.  Discuss side effects of oral TKI sorafenib (fatigue, diarrhea, hand foot syndrome) and oral VEGF inhibitor lenvatinib (hypertension, hand-foot syndrome, decreased appetite, proteinuria).  Discuss REFLECT study (sorafenib versus lenvatinib).  Lenvatinib was noninferior to sorafenib (median overall survival 13.6 versus 12.3 months), the objective response  rate was higher (24% versus 9%), and median TTP was longer (7.4 versus 3.7 months).  Chemotherapy options include gemcitabine and oxaliplatin.  Side effects reviewed.  Discuss plan for lenvatinib (start at 8  mg/day rather than 12 mg/day secondary to weight).  Information provided.  Patient in agreement.  3.  Discuss imaging findings with Dr. Kathlene Cote- done.  Patient not a candidate for Y90 secondary to too much shunting to lung.  Prior chemo-embolization focused on lateral segment of left lobe. [place] is now spread out more (medially left lobe and into right lobe).  Lung cancer liver metastasis in inferior right lobe is stable.  4.  Present at tumor board on 09/20/2017. 5.  No pembrolizumab or Xgeva today.  6.  NS 1000cc today secondary to low blood pressure.  Patient declined further intervention. 7.  Magnesium low at 1.6. Will replace with 1 gram of intravenous magnesium today.  8.  Preauthorize Lenvatinib 9.  RTC after oral chemotherapy approved for MD assessment and labs (CBC with diff, CMP, Mg, AFP).  Addendum:  Patient presented at tumor board.  Agreed with systemic treatment of [place] with lenvatinib.   Honor Loh, NP  09/20/2017, 9:36 AM   I saw and evaluated the patient, participating in the key portions of the service and reviewing pertinent diagnostic studies and records.  I reviewed the nurse practitioner's note and agree with the findings and the plan.  The assessment and plan were discussed with the patient.  Additional diagnostic studies of PET scan are needed to clarify disease state and would change the clinical management.  Multiple questions were asked by the patient and answered.   Lequita Asal, MD 09/20/2017, 9:36 AM

## 2017-09-20 NOTE — Progress Notes (Signed)
Patient continues to have problems with fatigue and no energy.  States he is eating better for past couple of days..  Also had some diarrhea last Friday and Saturday. States he had a lot of abdominal cramping that "doubled him over".    BP low today - Sitting 87/50 HR 50  Standing 70/39 HR 52.  Patient is asymptomatic.

## 2017-09-20 NOTE — Patient Instructions (Signed)
Lenvatinib oral capsule What is this medicine? LENVATINIB (len VA ti nib) is a medicine that targets proteins in cancer cells and stops the cancer cells from growing. It is used to treat thyroid cancer and kidney cancer. This medicine may be used for other purposes; ask your health care provider or pharmacist if you have questions. COMMON BRAND NAME(S): [place] What should I tell my health care provider before I take this medicine? They need to know if you have any of these conditions: -diabetes -high blood pressure -heart disease -history of blood clots -history of a drug or alcohol abuse problem -history of irregular heartbeat -history of low levels of calcium, magnesium, or potassium in the blood -inflammatory bowel disease -kidney disease -liver disease -protein in your urine -an unusual or allergic reaction to lenvatinib, other medicines, foods, dyes, or preservatives -pregnant or trying to get pregnant -breast-feeding How should I use this medicine? Take this medicine by mouth with a glass of water. Follow the directions on the prescription label. Swallow the capsules whole. Do not cut, crush or chew this medicine. Take your medicine at regular intervals. Do not take it more often than directed. Do not stop taking except on your doctor's advice. Talk to your pediatrician regarding the use of this medicine in children. Special care may be needed. Overdosage: If you think you have taken too much of this medicine contact a poison control center or emergency room at once. NOTE: This medicine is only for you. Do not share this medicine with others. What if I miss a dose? If you miss a dose, take it as soon as you can. If your next dose is to be taken in less than 12 hours, then do not take the missed dose. Take the next dose at your regular time. Do not take double or extra doses. What may interact with this medicine? Do not take this medicine with any of the following  medications: -cisapride -dofetilide -dronedarone -pimozide -thioridazine -ziprasidone This medicine may also interact with the following medications: -alfuzosin -bedaquiline -certain antibiotics like azithromycin, clarithromycin or erythromycin -certain medicines for bladder problems like solifenacin, tolterodine -certain medicines for depression, anxiety, or psychotic disturbances -certain medicines for fungal infections like ketoconazole, posaconazole, voriconazole, fluconazole, and itraconazole -certain medicines for irregular heart beat like amiodarone, dofetilide, flecainide, propafenone, quinidine -chloroquine -ciprofloxacin -cyclobenzaprine -ezogabine -fingolimod -granisetron -leuprolide -ritonavir -lopinavir; ritonavir -methadone -metronidazole -mifepristone -octreotide -ondansetron -other medicines that prolong the QT interval (cause an abnormal heart rhythm) -pasireotide -pentamidine -promethazine -quinine -ranolazine -rifampin -rilpivirine -romidepsin -saquinavir -tacrolimus -telavancin -telithromycin -tetrabenazine -tizanidine -toremifene -vardenafil -vorinostat This list may not describe all possible interactions. Give your health care provider a list of all the medicines, herbs, non-prescription drugs, or dietary supplements you use. Also tell them if you smoke, drink alcohol, or use illegal drugs. Some items may interact with your medicine. What should I watch for while using this medicine? Visit your doctor for regular check ups. Report any side effects. Continue your course of treatment unless your doctor tells you to stop. You will need blood work done while you are taking this medicine. Do not become pregnant while taking this medicine or for 2 weeks after stopping it. Women should inform their doctor if they wish to become pregnant or think they might be pregnant. There is a potential for serious side effects to an unborn child. Talk to your  health care professional or pharmacist for more information. Do not breast-feed an infant while taking this medicine. Be careful brushing and flossing  your teeth or using a toothpick because you may get an infection or bleed more easily. If you have any dental work done, tell your dentist you are receiving this medicine. This medicine may increase your risk to bruise or bleed. Call your doctor or health care professional if you notice any unusual bleeding. This drug may make you feel generally unwell. This is not uncommon, as chemotherapy can affect healthy cells as well as cancer cells. Report any side effects. Continue your course of treatment even though you feel ill unless your doctor tells you to stop. This medicine may interfere with the ability to have a child. You should talk with your doctor or health care professional if you are concerned about your fertility. What side effects may I notice from receiving this medicine? Side effects that you should report to your doctor or health care professional as soon as possible: -allergic reactions like skin rash, itching or hives, swelling of the face, lips, or tongue -breathing problems -chest pain or palpitations -dizziness -feeling faint or lightheaded, falls -headache -high blood pressure -seizures -signs and symptoms of bleeding such as bloody or black, tarry stools; red or dark-brown urine; spitting up blood or brown material that looks like coffee grounds; red spots on the skin; unusual bruising or bleeding from the eye, gums, or nose -signs and symptoms of a dangerous change in heartbeat or heart rhythm like chest pain; dizziness; fast or irregular heartbeat; palpitations; feeling faint or lightheaded, falls; breathing problems -signs and symptoms of kidney injury like trouble passing urine or change in the amount of urine -signs and symptoms of liver injury like dark yellow or brown urine; general ill feeling or flu-like symptoms;  light-colored stools; loss of appetite; nausea; right upper belly pain; unusually weak or tired; yellowing of the eyes or skin -signs and symptoms of low potassium like muscle cramps or muscle pain; chest pain; dizziness; feeling faint or lightheaded, falls; palpitations; breathing problems; or fast, irregular heartbeat -signs and symptoms of a stroke like changes in vision; confusion; trouble speaking or understanding; severe headaches; sudden numbness or weakness of the face, arm or leg; trouble walking; dizziness; loss of balance or coordination -stomach pain -swelling of the legs or ankles -unusually weak or tired Side effects that usually do not require medical attention (report to your doctor or health care professional if they continue or are bothersome): -diarrhea -joint pain -loss of appetite -mouth sores -muscle pain -nausea, vomiting -weight loss This list may not describe all possible side effects. Call your doctor for medical advice about side effects. You may report side effects to FDA at 1-800-FDA-1088. Where should I keep my medicine? Keep out of the reach of children. Store between 20 and 25 degrees C (68 and 77 degrees F). Throw away any unused medicine after the expiration date. NOTE: This sheet is a summary. It may not cover all possible information. If you have questions about this medicine, talk to your doctor, pharmacist, or health care provider.  2018 [employer]vier/Gold Standard (2015-12-30 11:15:33)

## 2017-09-20 NOTE — Telephone Encounter (Signed)
Oral Oncology Pharmacist Encounter  Received new prescription for Lenvima (lenvatinib) for the treatment of hepatocellular carcinoma, planned duration until disease progression or unacceptable drug toxicity.  BP/CMP/Mg from 09/20/17 assessed, mag slightly low at 1.6, no other relevant lab abnormalities. Thyroid panel from today is still in process. Prescription dose and frequency assessed. Dose appropriate for patient weight.   Current medication list in Epic reviewed, no DDIs with lenvatinib identified.   Prescription has been e-scribed to the [facility] for benefits analysis and approval.  Oral Oncology Clinic will continue to follow for insurance authorization, copayment issues, initial counseling and start date.  Darl Pikes, PharmD, BCPS Hematology/Oncology Clinical Pharmacist [facility] [phone]  09/20/2017 2:17 PM

## 2017-09-21 LAB — THYROID PANEL WITH TSH
Free Thyroxine Index: 2.8 (ref 1.2–4.9)
T3 Uptake Ratio: 31 % (ref 24–39)
T4, Total: 9.1 ug/dL (ref 4.5–12.0)
TSH: 2.66 u[IU]/mL (ref 0.450–4.500)

## 2017-09-24 ENCOUNTER — Encounter: Payer: Self-pay | Admitting: Interventional Radiology

## 2017-09-24 NOTE — Telephone Encounter (Signed)
Oral Oncology Patient Advocate Encounter   Have been calling patient to get financial information so I can fill out [employer] assistance. Patient does not pharmacy coverage.  Called 09/20/2017 L/M Called 09/24/2017 L/M   [place] Patient Advocate [phone] 09/24/2017 3:59 PM

## 2017-09-26 NOTE — Telephone Encounter (Signed)
  Thanks  M 

## 2017-09-26 NOTE — Telephone Encounter (Signed)
Oral Chemotherapy Pharmacist Encounter  Called patient again today and LVM letting him know he had to apply for manufacturer assistance for his new medication. Asked him to give [place] a call so that we could get this process started.  Additional called patient's son and LVM for him to call us back.    Darl Pikes, PharmD, BCPS Hematology/Oncology Clinical Pharmacist [facility] [phone]  09/26/2017 10:03 AM

## 2017-09-28 ENCOUNTER — Telehealth: Payer: Self-pay | Admitting: Pharmacist

## 2017-09-28 NOTE — Telephone Encounter (Signed)
Oral Chemotherapy Pharmacist Encounter  Called patient again today and LVM letting him know he had to apply for manufacturer assistance for his new medication. Asked him to give [place] a call so that we could get this process started.  Additional called patient's son and LVM for him to call us back.   Darl Pikes, PharmD, BCPS Hematology/Oncology Clinical Pharmacist [facility] [phone]  09/28/2017 1:48 PM

## 2017-10-01 NOTE — Telephone Encounter (Signed)
Oral Oncology Patient Advocate Encounter   Called patient and left message to please call us, we need to get his application for assistance approved.   [place] Patient Advocate [phone] 10/01/2017 10:08 AM

## 2017-10-02 NOTE — Telephone Encounter (Signed)
Oral Oncology Patient Advocate Encounter  Met patient in lobby to complete application for Lenvima in an effort to reduce patient's out of pocket expense for Lenvima to $0.    Patient has no pharmacy coverage.  Application completed and faxed to [phone].   Patient assistance phone number for follow up is [phone].   This encounter will be updated until final determination.   [place] Patient Advocate [phone] 10/02/2017 12:02 PM

## 2017-10-08 NOTE — Telephone Encounter (Signed)
Oral Oncology Patient Advocate Encounter  Patient has been approved by [employer] to receive Lenvima. They have tried several times to reach Gerald [name] unable to reach him. I told them I would call him. I called him and asked him to please call them so they can deliver his Lenvima. He is going to call them and then call me. [phone]   [place] Patient Advocate [phone] 10/08/2017 10:45 AM

## 2017-10-08 NOTE — Telephone Encounter (Signed)
Oral Oncology Patient Advocate [facility] and we did a three way call and spoke with the patient. He was having difficulty getting to the right person. They will now call him with a delivery date. Told him to call me if he needs any help.    [place] Patient Advocate [phone] 10/08/2017 11:42 AM

## 2017-10-10 NOTE — Telephone Encounter (Signed)
Oral Oncology Patient Advocate Encounter  Per Joylene Grapes will be mailed 10/11/2017. Spoke with [facility] Patient Advocate [phone] 10/10/2017 10:12 AM

## 2017-10-11 ENCOUNTER — Telehealth: Payer: Self-pay | Admitting: Pharmacist

## 2017-10-11 ENCOUNTER — Telehealth: Payer: Self-pay | Admitting: Hematology and Oncology

## 2017-10-11 ENCOUNTER — Other Ambulatory Visit: Payer: Self-pay | Admitting: *Deleted

## 2017-10-11 DIAGNOSIS — C220 Liver cell carcinoma: Secondary | ICD-10-CM

## 2017-10-11 DIAGNOSIS — Z85118 Personal history of other malignant neoplasm of bronchus and lung: Secondary | ICD-10-CM

## 2017-10-11 NOTE — Telephone Encounter (Signed)
Oral Oncology Patient Advocate Encounter  Per [place] patients Gerald [name] was mailed on 10/09/2017. Tracking # [id number]    [place] Patient Advocate [phone] 10/11/2017 12:07 PM

## 2017-10-11 NOTE — Telephone Encounter (Signed)
Oral Chemotherapy Pharmacist Encounter  Medication was mailed to Gerald [name] on 10/30. Spoke with Dr. Mike Gip and they plan is to bring the patient into clinic to be seen and for lab work prior to starting the [facility].  Called patient and informed him of the plan. He know not to start the medication until he is seen. I will education Mr. Gallen when he is in clinic.   Darl Pikes, PharmD, BCPS Hematology/Oncology Clinical Pharmacist [facility] [place] [phone]  10/11/2017 1:58 PM

## 2017-10-12 ENCOUNTER — Inpatient Hospital Stay: Payer: Medicare Other | Admitting: Hematology and Oncology

## 2017-10-12 ENCOUNTER — Inpatient Hospital Stay: Payer: Medicare Other

## 2017-10-12 NOTE — Progress Notes (Unsigned)
[facility] day:  10/12/2017   Chief Complaint: Gerald [name] is a 66 y.o. male with stage IV adenocarcinoma of the lung and hepatocellular carcinoma who is seen for 3 week assessment prior to initiation of lenvatinib.  HPI:  The patient was last seen in the medical oncology clinic on 10/112018.  At that time, he felt "ok" s/p chemoembolization. He had a recent history of diarrhea.  He was HYPOtensive, with orthostatic changes.  He had lost 3 more pounds. He had chronic shortness of breath. WBC was 6800 with an [place] of 4400. Hemoglobin was 13.2, hematocrit 38.9, and platelets 241,000.   At last visit, PET scan revealed progressive liver changes c/w progressive multi-focal hepatocellular malignancy.  We discussed lenvatinib. He received the medication yesterday.  During the interim,   Past Medical History:  Diagnosis Date  . Arthritis    Rheumatoid arthritis  . Cancer ([place])    lung ca   . Collagen vascular disease (HCC)    RA  . Dyspnea   . Hepatitis    b and c  . Mass of lung   . Pneumonia   . Pneumothorax    spontaneous  . Renal disorder    as a child    Past Surgical History:  Procedure Laterality Date  . FLEXIBLE BRONCHOSCOPY N/A 03/30/2016   Procedure: FLEXIBLE BRONCHOSCOPY;  Surgeon: Vilinda Boehringer, MD;  Location: [facility] ORS;  Service: Cardiopulmonary;  Laterality: N/A;  . IR ANGIOGRAM SELECTIVE EACH ADDITIONAL VESSEL  07/24/2017  . IR ANGIOGRAM SELECTIVE EACH ADDITIONAL VESSEL  07/24/2017  . IR ANGIOGRAM SELECTIVE EACH ADDITIONAL VESSEL  07/24/2017  . IR ANGIOGRAM SELECTIVE EACH ADDITIONAL VESSEL  07/24/2017  . IR ANGIOGRAM SELECTIVE EACH ADDITIONAL VESSEL  07/24/2017  . IR ANGIOGRAM SELECTIVE EACH ADDITIONAL VESSEL  07/24/2017  . IR ANGIOGRAM SELECTIVE EACH ADDITIONAL VESSEL  07/24/2017  . IR ANGIOGRAM SELECTIVE EACH ADDITIONAL VESSEL  08/20/2017  . IR ANGIOGRAM VISCERAL SELECTIVE  07/24/2017  . IR ANGIOGRAM VISCERAL SELECTIVE   08/20/2017  . IR EMBO ARTERIAL NOT HEMORR HEMANG INC GUIDE ROADMAPPING  07/24/2017  . IR EMBO TUMOR ORGAN ISCHEMIA INFARCT INC GUIDE ROADMAPPING  08/20/2017  . IR RADIOLOGIST EVAL & MGMT  07/11/2017  . IR US GUIDE VASC ACCESS RIGHT  07/24/2017  . Ureter repair as a child      Family History  Problem Relation Age of Onset  . Brain cancer Mother   . Lung cancer Maternal Uncle   . Cancer Sister        Metastatic cancer- unknown primary    Social History:  reports that he quit smoking about 17 years ago. His smoking use included Cigarettes. He has a 13.00 pack-year smoking history. He has never used smokeless tobacco. He reports that he drinks alcohol. He reports that he does not use drugs.  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  He lives in [place].  Contact number is [phone].  He has a great pyrenees.  He has been working on his truck.  The patient is alone today.  Allergies:  Allergies  Allergen Reactions  . Nsaids Other (See Comments)    Can take NSAIDs short term but cannot be on it long term d/t renal insuff  . Sulfa Antibiotics Other (See Comments)    Reaction:  GI upset     Current Medications: Current Outpatient Prescriptions  Medication Sig Dispense Refill  .  calcium-vitamin D (OSCAL WITH D) 500-200 MG-UNIT tablet Take 1 tablet by mouth 2 (two) times daily. 180 tablet 0  . enoxaparin (LOVENOX) 60 MG/0.6ML injection Inject 0.6 mLs (60 mg total) into the skin every 12 (twelve) hours. 60 Syringe 2  . folic acid (FOLVITE) 1 MG tablet Take 1 tablet (1 mg total) by mouth daily. (Patient not taking: Reported on 09/20/2017) 30 tablet 3  . lenvatinib 8 mg daily dose (LENVIMA) 4 (2) MG capsule Take 2 capsules (8 mg total) by mouth daily. 60 capsule 0  . oxyCODONE (OXY IR/ROXICODONE) 5 MG immediate release tablet Take 1-2 tablets every 4 hours as needed for pain 40 tablet 0   No current facility-administered medications for this  visit.     Review of Systems:  GENERAL:  Feels "ok". No fever, chills or sweats.  Weight down 3 pounds. PERFORMANCE STATUS (ECOG):  2 HEENT: No visual changes, sore throat, mouth sores or tenderness. Lungs:  Chronic shortness of breath or cough. No hemoptysis. Cardiac:  No chest pain, palpitations, orthopnea, or PND.  Sleeps in a recliner. GI:  Appetite fair.  No nausea, vomiting, diarrhea, constipation, melena or hematochezia. GU:  No urgency, frequency, dysuria, or hematuria. Musculoskeletal:  Chronic right knee pain. No back pain.  No muscle tenderness. Extremities:  No pain or swelling. Skin:  No rashes or ulcers. Neuro:  Neuropathy (stable) on gabapentin.  No headache, focal weakness, balance or coordination issues. Endocrine:  No diabetes, thyroid issues, hot flashes or night sweats. Psych:  No mood changes, depression or anxiety. Pain:  Diffuse abdominal pain (4 out of 10).  Review of systems:  All other systems reviewed and found to be negative.  Physical Exam: There were no vitals taken for this visit. GENERAL:  Thin gentleman sitting comfortably in the exam room in no acute distress. MENTAL STATUS:  Alert and oriented to person, place and time.  HEAD:Wearing a cap. Short gray hair. Albertina Parr. Temporal wasting. Normocephalic, atraumatic, face symmetric, no Cushingoid features. EYES:Blue eyes. Pupils equal round and reactive to light and accomodation. No conjunctivitis or scleral icterus. MNO:[id number] clear without lesion. Tonguenormal. Mucous membranes dry. RESPIRATORY:Clear to auscultationwithout rales, wheezes or rhonchi. CARDIOVASCULAR:Regular rate andrhythmwithout murmur, rub or gallop. ABDOMEN:Soft, slightly tender over liver.  No guarding or rebound tenderness.  Active bowel sounds and no splenomegaly. No masses. SKIN: Dry.  No rashes. EXTREMITIES: No edema, no skin discoloration or tenderness. No palpable cords. LYMPHNODES: No palpable  cervical, supraclavicular, axillary or inguinal adenopathy  NEUROLOGICAL: Unremarkable. PSYCH: Appropriate.    Imaging studies: 03/27/2016:  Chest CT angiogram revealed an 8 cm mass in the medial right upper lobe. The mass abutted the mediastinum and possibly invaded the left atrium. There was associated widespread thoracic adenopathy. There was a 2.6 cm enhancing lesion in the medial segment of the left hepatic lobe. 04/05/2016:  PET scan revealed intense hypermetabolism (SUV 17.95) associated with a 7.5 cm central perihilar right lung lesion. There was mediastinal (sub-carinal node SUV 8.89; 2.6 cm right paratracheal node SUV 17.49) and upper abdominal retroperitoneal hypermetabolic adenopathy (9 mm aortocaval node SUV 9.2). There were 2 liver metastasis (2.3 cm left lobe SUV 6.19; 2.2 cm segment VIII SUV 5.18). There are multifocal bone metastasis (C2 vertebral body SUV 9.0; 1.6 cm left sacral ala lesion SUV of 8.6).  03/31/2016:  Head MRI was indeterminate for early metastatic disease to the brain. There was a small focus of gyral enhancement in the anterior right frontal lobe likely is post ischemic,  while a small posterior right cerebellar enhancing lesion was more suspicious for small brain metastasis. There were multiple small primarily subacute appearing infarcts in the bilateral MCA and left PICA territories.  04/07/2016:  Bone scan revealed a subtle distal right femur lesion which might be a small metastasis.  Bone scan was felt to be an unreliable modality for evaluation of osseous metastatic disease as the bone metastases seen on the recent PET and MRI were not evident. 05/12/2016:  Head MRI revealed expected interval evolution of bilateral cerebral in cerebellar infarcts. There was resolution of right frontal and right cerebellar enhancement consistent with subacute infarcts. There was a new 3 mm focus of gyral enhancement in the left occipital lobe and a possible new 4 mm enhancing  lesion in the left cerebellum (? vascular enhancement versus tiny metastasis).  08/16/2016:  Head MRI revealed multiple areas of improving enhancement and restricted diffusion compatible with resolving subacute infarcts.  There was no evidence of metastatic disease or acute infarction  10/19/2016:  Chest, abdomen, and pelvic CT scan revealed radiation changes in the right lung.  The superimposed right lung pneumonia had improved.  The 6.4 x 2.4 cm right perihilar mass had decreased.  There was improving thoracic lymphadenopathy.  There was progression of hepatic metastases, measuring up to 4.7 cm.  Specifically, there was a 3.9 x 3.7 cm metastasis in segment 8 (previously 2.5 x 2.8 cm) and a 4.7 x 4.0 cm metastasis in segment 4B (previously 3.1 x 2.6 cm). 11/14/2016:  PET scan revealed 2 liver masses. Both were significantly more hypermetabolic than on 54/62/7035 and have enlarged compared to the prior PET-CT.  The segment 8 lesion was centrally necrotic but with high activity along its periphery (7.4), and about the same size as it was on 10/19/2016. The lateral segment left hepatic lobe lesion had enlarged (5.9 x 5.3 cm) compared to 10/19/16 (4.1 x 4.4 cm).  The bony metastatic lesions were no longer hypermetabolic and have resolved.  There were post therapy and postoperative findings in the right lung, with a considerable amount of suspected radiation pneumonitis and radiation fibrosis. 12/29/2016:  Chest, abdomen, and pelvic CT revealed mild reduction in size and central necrosis of the hepatic metastatic lesions.  There was continued post therapy related findings along the right hilar region. Dominant right perihilar mass was reduced in thickness compared to 10/19/2016.  04/13/2017:  Chest, abdomen, and pelvic CT revealed new left hepatic vein thrombosis. This somewhat obscured the known left hepatic lobe tumor although overall the left hepatic lobe masses were thought to be similar in size compared the  prior exam. The segment 8 lesion was less well seen than on the prior exam.  The central necrotic portion was seen but the peripheral rim was much less conspicuous (could represent improvement in this tumor).  There was mild increase in adenopathy in the gastrohepatic ligament.  There was essentially stable appearance in the lungs with considerable radiation pneumonitis, scarring, and volume loss in the right lung, along with a trace loculated pleural effusion with enhancing margins.  There was stable appearance of prior splenic infarcts and prior scarring in the left mid kidney. 07/02/2017:  Chest, abdomen, and pelvic CT revealed interval increase in the left hepatic vein thrombosis with progressive heterogeneity and enhancement of the left hepatic lobe, predominantly obscuring the known left hepatic lobe lesion. There was a possible new 1.5 cm lateral left hepatic lobe lesion versus focal area of heterogeneity. There was slight interval decrease in the size  of the irregular low-attenuation lesion within segment 8 of the liver. Right lung was stable in appearance with interval development of consolidation within the lingula representing atelectasis or infection. 07/05/2017:  Abdominal MRI revealed persistent mass within lateral segment of left lobe of liver with progressive thrombosis of the left portal vein. There was also persistent thrombosis of the left hepatic vein. Portal vein thrombus extended up to the level of the main portal vein and was likely tumor thrombus. The liver has morphologic features suggestive of early cirrhosis. All findings are highly suggestive that the lesion in the left lobe of liver represents hepatoma rather than lung cancer metastasis. The lesion within segment 8 of the liver had decreased in size when compared with 04/13/2017.  The small lesion within segment 7 of the liver appeared new from previous exam. Indeterminate. This may represent an area of lung cancer metastasis or  possibly multifocal [place] 09/17/2017:  PET scan revealed the original 2 lesions shown on the prior exam are less cohesivly visible, there were new spreading confluent lesions in segments 1, 2, 3, 4, 5, and 8 of the liver compatible with progressive multifocal hepatic malignancy. 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  There was post therapy related findings in the lungs without active pulmonary or osseous metastatic disease identified.   No visits with results within 3 Day(s) from this visit.  Latest known visit with results is:  Appointment on 09/20/2017  Component Date Value Ref Range Status  . WBC 09/20/2017 6.8  3.8 - 10.6 K/uL Final  . RBC 09/20/2017 4.02* 4.40 - 5.90 MIL/uL Final  . Hemoglobin 09/20/2017 13.2  13.0 - 18.0 g/dL Final  . HCT 09/20/2017 38.9* 40.0 - 52.0 % Final  . MCV 09/20/2017 96.8  80.0 - 100.0 fL Final  . MCH 09/20/2017 32.9  26.0 - 34.0 pg Final  . MCHC 09/20/2017 34.0  32.0 - 36.0 g/dL Final  . RDW 09/20/2017 14.4  11.5 - 14.5 % Final  . Platelets 09/20/2017 241  150 - 440 K/uL Final  . Neutrophils Relative % 09/20/2017 66  % Final  . Neutro Abs 09/20/2017 4.4  1.4 - 6.5 K/uL Final  . Lymphocytes Relative 09/20/2017 16  % Final  . Lymphs Abs 09/20/2017 1.1  1.0 - 3.6 K/uL Final  . Monocytes Relative 09/20/2017 13  % Final  . Monocytes Absolute 09/20/2017 0.9  0.2 - 1.0 K/uL Final  . Eosinophils Relative 09/20/2017 4  % Final  . Eosinophils Absolute 09/20/2017 0.2  0 - 0.7 K/uL Final  . Basophils Relative 09/20/2017 1  % Final  . Basophils Absolute 09/20/2017 0.1  0 - 0.1 K/uL Final  . Sodium 09/20/2017 138  135 - 145 mmol/L Final  . Potassium 09/20/2017 4.1  3.5 - 5.1 mmol/L Final  . Chloride 09/20/2017 103  101 - 111 mmol/L Final  . CO2 09/20/2017 24  22 - 32 mmol/L Final  . Glucose, Bld 09/20/2017 125* 65 - 99 mg/dL Final  . BUN 09/20/2017 10  6 - 20 mg/dL Final  . Creatinine, Ser 09/20/2017 0.77  0.61 -  1.24 mg/dL Final  . Calcium 09/20/2017 8.7* 8.9 - 10.3 mg/dL Final  . Total Protein 09/20/2017 7.6  6.5 - 8.1 g/dL Final  . Albumin 09/20/2017 2.9* 3.5 - 5.0 g/dL Final  . AST 09/20/2017 58* 15 - 41 U/L Final  . ALT 09/20/2017 28  17 - 63 U/L Final  . Alkaline Phosphatase 09/20/2017  88  38 - 126 U/L Final  . Total Bilirubin 09/20/2017 0.9  0.3 - 1.2 mg/dL Final  . GFR calc non Af Amer 09/20/2017 >60  >60 mL/min Final  . GFR calc Af Amer 09/20/2017 >60  >60 mL/min Final   Comment: (NOTE) The eGFR has been calculated using the CKD EPI equation. This calculation has not been validated in all clinical situations. eGFR's persistently <60 mL/min signify possible Chronic Kidney Disease.   . Anion gap 09/20/2017 11  5 - 15 Final  . Magnesium 09/20/2017 1.6* 1.7 - 2.4 mg/dL Final  . TSH 09/20/2017 2.660  0.450 - 4.500 uIU/mL Final  . T4, Total 09/20/2017 9.1  4.5 - 12.0 ug/dL Final  . T3 Uptake Ratio 09/20/2017 31  24 - 39 % Final  . Free Thyroxine Index 09/20/2017 2.8  1.2 - 4.9 Final   Comment: (NOTE) Performed At: [facility] [address] [place], [place] [id number] Lindon Romp MD UY:[id number]     Assessment:  Gerald [name] is a 66 y.o. male with stage IV adenocarcinoma of the lung and associated leukocytosis with eosinophilia. He presented with a 30 pound weight loss, shortness of breath, cough, and left leg discomfort. He has a 13-15 pack year smoking history.  Bronchoscopy on 03/30/2016 revealed an endobronchial lesion in the RUL anterior segment with 95% occlusion. There was extrinsic compression in the right middle lobe secondary to posterior mass effect. Pathology confirmed non-small cell lung cancer, favor adenocarcinoma.  PD-L1 testing revealed high expression (> 50%).  EGFR, ALK, and ROS1 were negative.  CEA was 2.3 on 03/27/2016.  PET scan on 04/05/2016 revealed intense hypermetabolism (SUV 17.95) associated with a 7.5 cm central perihilar right lung lesion.  There was mediastinal (sub-carinal node SUV 8.89; 2.6 cm right paratracheal node SUV 17.49) and upper abdominal retroperitoneal hypermetabolic adenopathy (9 mm aortocaval node SUV 9.2). There were 2 liver metastasis (2.3 cm left lobe SUV 6.19; 2.2 cm segment VIII SUV 5.18). There are multifocal bone metastasis (C2 vertebral body SUV 9.0; 1.6 cm left sacral ala lesion SUV of 8.6).   He had marked leukocytosis with hypereosinophilia. Initial WBC was 72,300 with 53% eosinophils. Bone marrow on 03/29/2016 revealed marked increase in marrow eosinophils (approximate 30%). There was no diagnostic morphologic evidence of a myeloproliferative or lymphoid neoplasm. Flow cytometry revealed significant increase in eosinophils (54%). There was relative decreased myeloid cells with no significant immunophenotypic abnormalities or increase in blasts. There was no lymphoid abnormalities or evidence of clonality. FISH studies were negative for myeloproliferative neoplasms with eosinophilia (PDGFRA, PDGFRB, FGFR1). BCR-ABL was negative on 03/28/2016.  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  Left lower extremity duplex on 03/25/2016 revealed no evidence of DVT. Discomfort improved on Neurontin 300 mg a day and continues to improve with declining eosinophilia.  He received 41.4 Gy from 04/11/2016 - 05/23/2016.  He received 4 weeks of concurrent carboplatin and Taxol (04/17/2016 - 05/22/2016).    He has received 15 cycles of Keytruda (07/03/2016 - 10/12/2016; 11/22/2016 - 06/05/2017).  He has tolerated treatment well.  He receives [place] every 6 weeks (began 05/22/2016; last 06/26/2017).  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  He was on Xarelto from 04/13/2017 - 07/24/2017. Lovenox injections were not started on 07/13/2017 as ordered; ordered again to start 07/25/2017.   He has increased liver function tests.  Etiology is possibly  secondary to acute hepatitis, irritation due to thrombosis,  or progressive disease (metastatic liver lesion or [place]).  He is hepatitis B core antibody and hepatitis C antibody positive.  Hepatitis C RNA was 5.474 log10 IU/ml (298,000 IU/ml) on 07/03/2017.  Hepatitis C genotype is 1a.  AFP was 589.7 on 07/03/2017.  Chest, abdomen, and pelvic CT on 07/02/2017 revealed interval increase in the left hepatic vein thrombosis with progressive heterogeneity and enhancement of the left hepatic lobe, predominantly obscuring the known left hepatic lobe lesion. There was a possible new 1.5 cm lateral left hepatic lobe lesion versus focal area of heterogeneity. There was slight interval decrease in the size of the irregular low-attenuation lesion within segment 8 of the liver. Right lung was stable in appearance with interval development of consolidation within the lingula representing atelectasis or infection.  Abdominal MRI on 07/05/2017 revealed persistent mass within lateral segment of left lobe of liver with progressive thrombosis of the left portal vein. There was also persistent thrombosis of the left hepatic vein. Portal vein thrombus extended up to the level of the main portal vein and was likely tumor thrombus. The liver has morphologic features suggestive of early cirrhosis. All findings are highly suggestive that the lesion in the left lobe of liver represents hepatoma rather than lung cancer metastasis. The lesion within segment 8 of the liver had decreased in size when compared with 04/13/2017.  The small lesion within segment 7 of the liver appeared new from previous exam. Indeterminate. This may represent an area of lung cancer metastasis or possibly multifocal HCC.  Ultrasound guided liver biopsy on 07/18/2017 revealed carcinoma c/w hepatocellular carcinoma.  He underwent arteriography, embolization and shunt calculations on 07/24/2017 in preparation for Y-90 radioembolization of his liver.  He underwent  left accessory hepatic arteriography with chemotherapy embolization of accessory hepatic artery with doxorubicin-eluting beads on 08/20/2017. Tumor was targeted in the lateral segment of the left lobe of the liver.  AFP has been followed: 589.7 on 07/03/2017, 819.2 on 08/20/2017, and 475.6 on 09/07/2017.  PET scan on 09/17/2017 revealed the original 2 lesions shown on the prior exam are less cohesivly visible, there were new spreading confluent lesions in segments 1, 2, 3, 4, 5, and 8 of the liver compatible with progressive multifocal hepatic malignancy. 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  There was post therapy related findings in the lungs without active pulmonary or osseous metastatic disease identified.  Code status is DNR/DNI.  Symptomatically, he reports that he feels "ok". He had some diarrhea over the weekend; resolved. Patient HYPOtensive, with orthostatic changes. Patient is trying to eat better, however he has lost 3 more pounds. He has chronic shortness of breath. WBC 6.8 with an [place] of 4400. Hemoglobin 13.2, hematocrit 38.9, platelets 241,000.   Plan: 1.  Labs today:  CBC with diff, CMP,  Mg, AFP, TSH.  2.  Discuss plan to initiate lenvatinib.  Side effects reviewed including hypertension, edema, fatigue, increased Qt, nausea/vomiting/diarrhea/abdominal pain, low blood counts, liver dysfunction, fistula/performation, thyroid dysfunction, bleeding/thromboembolic events, perforation.  Review PET scan- progressive liver changes c/w progressive multi-focal hepatocellular malignancy.  Discuss review in tumor board.  Discuss .  Consider options including sorafenib, lenvatinib, regorafenib.  Discuss side effects of oral TKI sorafenib (fatigue, diarrhea, hand foot syndrome) and oral VEGF inhibitor lenvatinib (hypertension, hand-foot syndrome, decreased appetite, proteinuria).  Discuss REFLECT study (sorafenib versus lenvatinib).  Lenvatinib  was noninferior to sorafenib (median overall survival 13.6 versus 12.3 months), the objective response rate was higher (24% versus  9%), and median TTP was longer (7.4 versus 3.7 months).  Chemotherapy options include gemcitabine and oxaliplatin.  Side effects reviewed.  Discuss plan for lenvatinib (start at 8 mg/day rather than 12 mg/day secondary to weight).  Information provided.  Patient in agreement.  3.  Discuss imaging findings with Dr. Kathlene Cote- done.  Patient not a candidate for Y90 secondary to too much shunting to lung.  Prior chemo-embolization focused on lateral segment of left lobe. [place] is now spread out more (medially left lobe and into right lobe).  Lung cancer liver metastasis in inferior right lobe is stable.  4.  Present at tumor board on 09/20/2017. 5.  No pembrolizumab or Xgeva today.  6.  NS 1000cc today secondary to low blood pressure.  Patient declined further intervention. 7.  Magnesium low at 1.6. Will replace with 1 gram of intravenous magnesium today.  8.  Preauthorize Lenvatinib 9.  RTC after oral chemotherapy approved for MD assessment and labs (CBC with diff, CMP, Mg, AFP).  Addendum:  Patient presented at tumor board.  Agreed with systemic treatment of [place] with lenvatinib.   Lequita Asal, MD  10/12/2017, 1:36 PM   I saw and evaluated the patient, participating in the key portions of the service and reviewing pertinent diagnostic studies and records.  I reviewed the nurse practitioner's note and agree with the findings and the plan.  The assessment and plan were discussed with the patient.  Additional diagnostic studies of PET scan are needed to clarify disease state and would change the clinical management.  Multiple questions were asked by the patient and answered.   Lequita Asal, MD 10/12/2017, 1:36 PM

## 2017-10-15 ENCOUNTER — Telehealth: Payer: Self-pay | Admitting: *Deleted

## 2017-10-15 ENCOUNTER — Inpatient Hospital Stay: Payer: Medicare Other | Attending: Hematology and Oncology

## 2017-10-15 ENCOUNTER — Telehealth: Payer: Self-pay | Admitting: Pharmacist

## 2017-10-15 ENCOUNTER — Other Ambulatory Visit: Payer: Self-pay | Admitting: *Deleted

## 2017-10-15 ENCOUNTER — Other Ambulatory Visit: Payer: Self-pay

## 2017-10-15 ENCOUNTER — Inpatient Hospital Stay (HOSPITAL_BASED_OUTPATIENT_CLINIC_OR_DEPARTMENT_OTHER): Payer: Medicare Other | Admitting: Hematology and Oncology

## 2017-10-15 VITALS — BP 110/71 | HR 90 | Temp 97.3°F | Resp 20 | Wt 131.3 lb

## 2017-10-15 DIAGNOSIS — Z66 Do not resuscitate: Secondary | ICD-10-CM | POA: Diagnosis not present

## 2017-10-15 DIAGNOSIS — D721 Eosinophilia: Secondary | ICD-10-CM | POA: Insufficient documentation

## 2017-10-15 DIAGNOSIS — I998 Other disorder of circulatory system: Secondary | ICD-10-CM | POA: Diagnosis not present

## 2017-10-15 DIAGNOSIS — C220 Liver cell carcinoma: Secondary | ICD-10-CM

## 2017-10-15 DIAGNOSIS — Z8619 Personal history of other infectious and parasitic diseases: Secondary | ICD-10-CM | POA: Diagnosis not present

## 2017-10-15 DIAGNOSIS — Z79899 Other long term (current) drug therapy: Secondary | ICD-10-CM | POA: Insufficient documentation

## 2017-10-15 DIAGNOSIS — R7989 Other specified abnormal findings of blood chemistry: Secondary | ICD-10-CM | POA: Insufficient documentation

## 2017-10-15 DIAGNOSIS — C3411 Malignant neoplasm of upper lobe, right bronchus or lung: Secondary | ICD-10-CM

## 2017-10-15 DIAGNOSIS — R0602 Shortness of breath: Secondary | ICD-10-CM | POA: Insufficient documentation

## 2017-10-15 DIAGNOSIS — C787 Secondary malignant neoplasm of liver and intrahepatic bile duct: Secondary | ICD-10-CM | POA: Insufficient documentation

## 2017-10-15 DIAGNOSIS — Z87891 Personal history of nicotine dependence: Secondary | ICD-10-CM | POA: Insufficient documentation

## 2017-10-15 DIAGNOSIS — R5383 Other fatigue: Secondary | ICD-10-CM | POA: Diagnosis not present

## 2017-10-15 DIAGNOSIS — Z7901 Long term (current) use of anticoagulants: Secondary | ICD-10-CM

## 2017-10-15 DIAGNOSIS — C7951 Secondary malignant neoplasm of bone: Secondary | ICD-10-CM

## 2017-10-15 DIAGNOSIS — R110 Nausea: Secondary | ICD-10-CM | POA: Diagnosis not present

## 2017-10-15 DIAGNOSIS — R109 Unspecified abdominal pain: Secondary | ICD-10-CM | POA: Diagnosis not present

## 2017-10-15 DIAGNOSIS — M069 Rheumatoid arthritis, unspecified: Secondary | ICD-10-CM | POA: Diagnosis not present

## 2017-10-15 DIAGNOSIS — I81 Portal vein thrombosis: Secondary | ICD-10-CM

## 2017-10-15 DIAGNOSIS — R634 Abnormal weight loss: Secondary | ICD-10-CM | POA: Insufficient documentation

## 2017-10-15 DIAGNOSIS — B1920 Unspecified viral hepatitis C without hepatic coma: Secondary | ICD-10-CM

## 2017-10-15 DIAGNOSIS — I820 Budd-Chiari syndrome: Secondary | ICD-10-CM

## 2017-10-15 DIAGNOSIS — Z8701 Personal history of pneumonia (recurrent): Secondary | ICD-10-CM | POA: Diagnosis not present

## 2017-10-15 DIAGNOSIS — G893 Neoplasm related pain (acute) (chronic): Secondary | ICD-10-CM

## 2017-10-15 DIAGNOSIS — Z7189 Other specified counseling: Secondary | ICD-10-CM

## 2017-10-15 DIAGNOSIS — Z85118 Personal history of other malignant neoplasm of bronchus and lung: Secondary | ICD-10-CM

## 2017-10-15 LAB — URINALYSIS, COMPLETE (UACMP) WITH MICROSCOPIC
Bacteria, UA: NONE SEEN
Bilirubin Urine: NEGATIVE
Glucose, UA: NEGATIVE mg/dL
Hgb urine dipstick: NEGATIVE
Ketones, ur: NEGATIVE mg/dL
Leukocytes, UA: NEGATIVE
Nitrite: NEGATIVE
Protein, ur: 100 mg/dL — AB
Specific Gravity, Urine: 1.025 (ref 1.005–1.030)
pH: 5 (ref 5.0–8.0)

## 2017-10-15 LAB — CBC WITH DIFFERENTIAL/PLATELET
Basophils Absolute: 0.1 10*3/uL (ref 0–0.1)
Basophils Relative: 1 %
Eosinophils Absolute: 0.2 10*3/uL (ref 0–0.7)
Eosinophils Relative: 3 %
HCT: 37.8 % — ABNORMAL LOW (ref 40.0–52.0)
Hemoglobin: 12.6 g/dL — ABNORMAL LOW (ref 13.0–18.0)
Lymphocytes Relative: 12 %
Lymphs Abs: 0.9 10*3/uL — ABNORMAL LOW (ref 1.0–3.6)
MCH: 32.4 pg (ref 26.0–34.0)
MCHC: 33.3 g/dL (ref 32.0–36.0)
MCV: 97.2 fL (ref 80.0–100.0)
Monocytes Absolute: 1.1 10*3/uL — ABNORMAL HIGH (ref 0.2–1.0)
Monocytes Relative: 14 %
Neutro Abs: 5.3 10*3/uL (ref 1.4–6.5)
Neutrophils Relative %: 70 %
Platelets: 217 10*3/uL (ref 150–440)
RBC: 3.89 MIL/uL — ABNORMAL LOW (ref 4.40–5.90)
RDW: 15.5 % — ABNORMAL HIGH (ref 11.5–14.5)
WBC: 7.6 10*3/uL (ref 3.8–10.6)

## 2017-10-15 LAB — COMPREHENSIVE METABOLIC PANEL
ALT: 47 U/L (ref 17–63)
AST: 112 U/L — ABNORMAL HIGH (ref 15–41)
Albumin: 2.9 g/dL — ABNORMAL LOW (ref 3.5–5.0)
Alkaline Phosphatase: 159 U/L — ABNORMAL HIGH (ref 38–126)
Anion gap: 8 (ref 5–15)
BUN: 17 mg/dL (ref 6–20)
CO2: 24 mmol/L (ref 22–32)
Calcium: 8.6 mg/dL — ABNORMAL LOW (ref 8.9–10.3)
Chloride: 103 mmol/L (ref 101–111)
Creatinine, Ser: 0.75 mg/dL (ref 0.61–1.24)
GFR calc Af Amer: >60 mL/min (ref >60)
GFR calc non Af Amer: >60 mL/min (ref >60)
Glucose, Bld: 113 mg/dL — ABNORMAL HIGH (ref 65–99)
Potassium: 3.3 mmol/L — ABNORMAL LOW (ref 3.5–5.1)
Sodium: 135 mmol/L (ref 135–145)
Total Bilirubin: 1.2 mg/dL (ref 0.3–1.2)
Total Protein: 8 g/dL (ref 6.5–8.1)

## 2017-10-15 LAB — TSH: TSH: 2.762 u[IU]/mL (ref 0.350–4.500)

## 2017-10-15 LAB — MAGNESIUM: Magnesium: 1.8 mg/dL (ref 1.7–2.4)

## 2017-10-15 MED ORDER — POTASSIUM CHLORIDE CRYS ER 20 MEQ PO TBCR: 20.0000 meq | EXTENDED_RELEASE_TABLET | Freq: Every day | ORAL | 0 refills | Status: DC

## 2017-10-15 MED ORDER — OXYCODONE HCL 5 MG PO TABS: ORAL_TABLET | ORAL | 0 refills | Status: DC

## 2017-10-15 NOTE — Progress Notes (Signed)
Here for follow up  Stated he could not urinate today for lab- given g ale  And snack. Stated he will try again   In NAD

## 2017-10-15 NOTE — Progress Notes (Signed)
[facility] day:  10/15/2017   Chief Complaint: Gerald [name] is a 66 y.o. male with stage IV adenocarcinoma of the lung and hepatocellular carcinoma who is seen for 3 week assessment prior to initiation of lenvatinib.  HPI:  The patient was last seen in the medical oncology clinic on 10/[id number].  At that time, he felt "ok" s/p chemoembolization. He had a recent history of diarrhea.  He was HYPOtensive, with orthostatic changes.  He had lost 3 more pounds. He had chronic shortness of breath. WBC was 6800 with an [place] of 4400. Hemoglobin was 13.2, hematocrit 38.9, and platelets 241,000.   At last visit, PET scan revealed progressive liver changes c/w progressive multi-focal hepatocellular malignancy.  We discussed lenvatinib. He received the medication yesterday.  During the interim, patient is doing "ok". Patient has no physical complaints today.  He notes that his stools are soft. Patient has chronic shortness of breath. Patient denies any B symptoms or interval infections. Patient is eating well, and has gained 4 pounds.    Past Medical History:  Diagnosis Date  . Arthritis    Rheumatoid arthritis  . Cancer ([place])    lung ca   . Collagen vascular disease (HCC)    RA  . Dyspnea   . Hepatitis    b and c  . Mass of lung   . Pneumonia   . Pneumothorax    spontaneous  . Renal disorder    as a child    Past Surgical History:  Procedure Laterality Date  . IR ANGIOGRAM SELECTIVE EACH ADDITIONAL VESSEL  07/24/2017  . IR ANGIOGRAM SELECTIVE EACH ADDITIONAL VESSEL  07/24/2017  . IR ANGIOGRAM SELECTIVE EACH ADDITIONAL VESSEL  07/24/2017  . IR ANGIOGRAM SELECTIVE EACH ADDITIONAL VESSEL  07/24/2017  . IR ANGIOGRAM SELECTIVE EACH ADDITIONAL VESSEL  07/24/2017  . IR ANGIOGRAM SELECTIVE EACH ADDITIONAL VESSEL  07/24/2017  . IR ANGIOGRAM SELECTIVE EACH ADDITIONAL VESSEL  07/24/2017  . IR ANGIOGRAM SELECTIVE EACH ADDITIONAL VESSEL  08/20/2017  . IR ANGIOGRAM  VISCERAL SELECTIVE  07/24/2017  . IR ANGIOGRAM VISCERAL SELECTIVE  08/20/2017  . IR EMBO ARTERIAL NOT HEMORR HEMANG INC GUIDE ROADMAPPING  07/24/2017  . IR EMBO TUMOR ORGAN ISCHEMIA INFARCT INC GUIDE ROADMAPPING  08/20/2017  . IR RADIOLOGIST EVAL & MGMT  07/11/2017  . IR US GUIDE VASC ACCESS RIGHT  07/24/2017  . Ureter repair as a child      Family History  Problem Relation Age of Onset  . Brain cancer Mother   . Lung cancer Maternal Uncle   . Cancer Sister        Metastatic cancer- unknown primary    Social History:  reports that he quit smoking about 17 years ago. His smoking use included cigarettes. He has a 13.00 pack-year smoking history. he has never used smokeless tobacco. He reports that he drinks alcohol. He reports that he does not use drugs.  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  He lives in [place].  Contact number is [phone].  He has a great pyrenees.  He has been working on his truck.  The patient is alone today.  Allergies:  Allergies  Allergen Reactions  . Nsaids Other (See Comments)    Can take NSAIDs short term but cannot be on it long term d/t renal insuff  . Sulfa Antibiotics Other (See Comments)    Reaction:  GI upset     Current Medications: Current Outpatient Medications  Medication Sig Dispense Refill  . enoxaparin (LOVENOX) 60 MG/0.6ML injection Inject 0.6 mLs (60 mg total) into the skin every 12 (twelve) hours. 60 Syringe 2  . oxyCODONE (OXY IR/ROXICODONE) 5 MG immediate release tablet Take 1-2 tablets every 4 hours as needed for pain 40 tablet 0  . calcium-vitamin D (OSCAL WITH D) 500-200 MG-UNIT tablet Take 1 tablet by mouth 2 (two) times daily. 381 tablet 0  . folic acid (FOLVITE) 1 MG tablet Take 1 tablet (1 mg total) by mouth daily. (Patient not taking: Reported on 09/20/2017) 30 tablet 3  . lenvatinib 8 mg daily dose (LENVIMA) 4 (2) MG capsule Take 2 capsules (8 mg total) by mouth daily.  (Patient not taking: Reported on 10/15/2017) 60 capsule 0   No current facility-administered medications for this visit.     Review of Systems:  GENERAL:  Feels "ok". No fever, chills or sweats.  Weight up 4 pounds. PERFORMANCE STATUS (ECOG):  2 HEENT: No visual changes, sore throat, mouth sores or tenderness. Lungs:  Chronic shortness of breath or cough. No hemoptysis. Cardiac:  No chest pain, palpitations, orthopnea, or PND.  Sleeps in a recliner. GI:  Appetite fair.  No nausea, vomiting, diarrhea, constipation, melena or hematochezia. GU:  No urgency, frequency, dysuria, or hematuria. Musculoskeletal:  Chronic right knee pain. No back pain.  No muscle tenderness. Extremities:  No pain or swelling. Skin:  No rashes or ulcers. Neuro:  Neuropathy (stable) on gabapentin.  No headache, focal weakness, balance or coordination issues. Endocrine:  No diabetes, thyroid issues, hot flashes or night sweats. Psych:  No mood changes, depression or anxiety. Pain:  No pain today.  Review of systems:  All other systems reviewed and found to be negative.  Physical Exam: Blood pressure 110/71, pulse 90, temperature (!) 97.3 F (36.3 C), temperature source Tympanic, resp. rate 20, weight 131 lb 4.8 oz (59.6 kg). GENERAL:  Thin gentleman sitting comfortably in the exam room in no acute distress. MENTAL STATUS:  Alert and oriented to person, place and time.  HEAD:Wearing a cap. Short gray hair. Albertina Parr. Temporal wasting. Normocephalic, atraumatic, face symmetric, no Cushingoid features. EYES:Blue eyes. Pupils equal round and reactive to light and accomodation. No conjunctivitis or scleral icterus. OFB:PZWCHENIDP clear without lesion. Tonguenormal. Mucous membranes dry. RESPIRATORY:Clear to auscultationwithout rales, wheezes or rhonchi. CARDIOVASCULAR:Regular rate andrhythmwithout murmur, rub or gallop. ABDOMEN:Soft, slightly tender over liver (stable).  No guarding or  rebound tenderness.  Active bowel sounds and no splenomegaly. No masses. SKIN: Dry.  No rashes. EXTREMITIES: No edema, no skin discoloration or tenderness. No palpable cords. LYMPHNODES: No palpable cervical, supraclavicular, axillary or inguinal adenopathy  NEUROLOGICAL: Unremarkable. PSYCH: Appropriate.    Imaging studies: 03/27/2016:  Chest CT angiogram revealed an 8 cm mass in the medial right upper lobe. The mass abutted the mediastinum and possibly invaded the left atrium. There was associated widespread thoracic adenopathy. There was a 2.6 cm enhancing lesion in the medial segment of the left hepatic lobe. 04/05/2016:  PET scan revealed intense hypermetabolism (SUV 17.95) associated with a 7.5 cm central perihilar right lung lesion. There was mediastinal (sub-carinal node SUV 8.89; 2.6 cm right paratracheal node SUV 17.49) and upper abdominal retroperitoneal hypermetabolic adenopathy (9 mm aortocaval node SUV 9.2). There were 2 liver metastasis (2.3 cm left lobe SUV 6.19; 2.2 cm segment VIII SUV 5.18). There are multifocal bone metastasis (C2 vertebral body SUV 9.0; 1.6 cm left sacral  ala lesion SUV of 8.6).  03/31/2016:  Head MRI was indeterminate for early metastatic disease to the brain. There was a small focus of gyral enhancement in the anterior right frontal lobe likely is post ischemic, while a small posterior right cerebellar enhancing lesion was more suspicious for small brain metastasis. There were multiple small primarily subacute appearing infarcts in the bilateral MCA and left PICA territories.  04/07/2016:  Bone scan revealed a subtle distal right femur lesion which might be a small metastasis.  Bone scan was felt to be an unreliable modality for evaluation of osseous metastatic disease as the bone metastases seen on the recent PET and MRI were not evident. 05/12/2016:  Head MRI revealed expected interval evolution of bilateral cerebral in cerebellar infarcts. There was  resolution of right frontal and right cerebellar enhancement consistent with subacute infarcts. There was a new 3 mm focus of gyral enhancement in the left occipital lobe and a possible new 4 mm enhancing lesion in the left cerebellum (? vascular enhancement versus tiny metastasis).  08/16/2016:  Head MRI revealed multiple areas of improving enhancement and restricted diffusion compatible with resolving subacute infarcts.  There was no evidence of metastatic disease or acute infarction  10/19/2016:  Chest, abdomen, and pelvic CT scan revealed radiation changes in the right lung.  The superimposed right lung pneumonia had improved.  The 6.4 x 2.4 cm right perihilar mass had decreased.  There was improving thoracic lymphadenopathy.  There was progression of hepatic metastases, measuring up to 4.7 cm.  Specifically, there was a 3.9 x 3.7 cm metastasis in segment 8 (previously 2.5 x 2.8 cm) and a 4.7 x 4.0 cm metastasis in segment 4B (previously 3.1 x 2.6 cm). 11/14/2016:  PET scan revealed 2 liver masses. Both were significantly more hypermetabolic than on 40/98/1191 and have enlarged compared to the prior PET-CT.  The segment 8 lesion was centrally necrotic but with high activity along its periphery (7.4), and about the same size as it was on 10/19/2016. The lateral segment left hepatic lobe lesion had enlarged (5.9 x 5.3 cm) compared to 10/19/16 (4.1 x 4.4 cm).  The bony metastatic lesions were no longer hypermetabolic and have resolved.  There were post therapy and postoperative findings in the right lung, with a considerable amount of suspected radiation pneumonitis and radiation fibrosis. 12/29/2016:  Chest, abdomen, and pelvic CT revealed mild reduction in size and central necrosis of the hepatic metastatic lesions.  There was continued post therapy related findings along the right hilar region. Dominant right perihilar mass was reduced in thickness compared to 10/19/2016.  04/13/2017:  Chest, abdomen, and  pelvic CT revealed new left hepatic vein thrombosis. This somewhat obscured the known left hepatic lobe tumor although overall the left hepatic lobe masses were thought to be similar in size compared the prior exam. The segment 8 lesion was less well seen than on the prior exam.  The central necrotic portion was seen but the peripheral rim was much less conspicuous (could represent improvement in this tumor).  There was mild increase in adenopathy in the gastrohepatic ligament.  There was essentially stable appearance in the lungs with considerable radiation pneumonitis, scarring, and volume loss in the right lung, along with a trace loculated pleural effusion with enhancing margins.  There was stable appearance of prior splenic infarcts and prior scarring in the left mid kidney. 07/02/2017:  Chest, abdomen, and pelvic CT revealed interval increase in the left hepatic vein thrombosis with progressive heterogeneity and enhancement of  the left hepatic lobe, predominantly obscuring the known left hepatic lobe lesion. There was a possible new 1.5 cm lateral left hepatic lobe lesion versus focal area of heterogeneity. There was slight interval decrease in the size of the irregular low-attenuation lesion within segment 8 of the liver. Right lung was stable in appearance with interval development of consolidation within the lingula representing atelectasis or infection. 07/05/2017:  Abdominal MRI revealed persistent mass within lateral segment of left lobe of liver with progressive thrombosis of the left portal vein. There was also persistent thrombosis of the left hepatic vein. Portal vein thrombus extended up to the level of the main portal vein and was likely tumor thrombus. The liver has morphologic features suggestive of early cirrhosis. All findings are highly suggestive that the lesion in the left lobe of liver represents hepatoma rather than lung cancer metastasis. The lesion within segment 8 of the liver had  decreased in size when compared with 04/13/2017.  The small lesion within segment 7 of the liver appeared new from previous exam. Indeterminate. This may represent an area of lung cancer metastasis or possibly multifocal [place] 09/17/2017:  PET scan revealed the original 2 lesions shown on the prior exam are less cohesivly visible, there were new spreading confluent lesions in segments 1, 2, 3, 4, 5, and 8 of the liver compatible with progressive multifocal hepatic malignancy. 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  There was post therapy related findings in the lungs without active pulmonary or osseous metastatic disease identified.   Appointment on 10/15/2017  Component Date Value Ref Range Status  . Magnesium 10/15/2017 1.8  1.7 - 2.4 mg/dL Final  . Sodium 10/15/2017 135  135 - 145 mmol/L Final  . Potassium 10/15/2017 3.3* 3.5 - 5.1 mmol/L Final  . Chloride 10/15/2017 103  101 - 111 mmol/L Final  . CO2 10/15/2017 24  22 - 32 mmol/L Final  . Glucose, Bld 10/15/2017 113* 65 - 99 mg/dL Final  . BUN 10/15/2017 17  6 - 20 mg/dL Final  . Creatinine, Ser 10/15/2017 0.75  0.61 - 1.24 mg/dL Final  . Calcium 10/15/2017 8.6* 8.9 - 10.3 mg/dL Final  . Total Protein 10/15/2017 8.0  6.5 - 8.1 g/dL Final  . Albumin 10/15/2017 2.9* 3.5 - 5.0 g/dL Final  . AST 10/15/2017 112* 15 - 41 U/L Final  . ALT 10/15/2017 47  17 - 63 U/L Final  . Alkaline Phosphatase 10/15/2017 159* 38 - 126 U/L Final  . Total Bilirubin 10/15/2017 1.2  0.3 - 1.2 mg/dL Final  . GFR calc non Af Amer 10/15/2017 >60  >60 mL/min Final  . GFR calc Af Amer 10/15/2017 >60  >60 mL/min Final   Comment: (NOTE) The eGFR has been calculated using the CKD EPI equation. This calculation has not been validated in all clinical situations. eGFR's persistently <60 mL/min signify possible Chronic Kidney Disease.   . Anion gap 10/15/2017 8  5 - 15 Final  . WBC 10/15/2017 7.6  3.8 - 10.6 K/uL Final  .  RBC 10/15/2017 3.89* 4.40 - 5.90 MIL/uL Final  . Hemoglobin 10/15/2017 12.6* 13.0 - 18.0 g/dL Final  . HCT 10/15/2017 37.8* 40.0 - 52.0 % Final  . MCV 10/15/2017 97.2  80.0 - 100.0 fL Final  . MCH 10/15/2017 32.4  26.0 - 34.0 pg Final  . MCHC 10/15/2017 33.3  32.0 - 36.0 g/dL Final  . RDW 10/15/2017 15.5* 11.5 - 14.5 % Final  .  Platelets 10/15/2017 217  150 - 440 K/uL Final  . Neutrophils Relative % 10/15/2017 70  % Final  . Neutro Abs 10/15/2017 5.3  1.4 - 6.5 K/uL Final  . Lymphocytes Relative 10/15/2017 12  % Final  . Lymphs Abs 10/15/2017 0.9* 1.0 - 3.6 K/uL Final  . Monocytes Relative 10/15/2017 14  % Final  . Monocytes Absolute 10/15/2017 1.1* 0.2 - 1.0 K/uL Final  . Eosinophils Relative 10/15/2017 3  % Final  . Eosinophils Absolute 10/15/2017 0.2  0 - 0.7 K/uL Final  . Basophils Relative 10/15/2017 1  % Final  . Basophils Absolute 10/15/2017 0.1  0 - 0.1 K/uL Final    Assessment:  Gerald [name] is a 66 y.o. male with stage IV adenocarcinoma of the lung and associated leukocytosis with eosinophilia. He presented with a 30 pound weight loss, shortness of breath, cough, and left leg discomfort. He has a 13-15 pack year smoking history.  Bronchoscopy on 03/30/2016 revealed an endobronchial lesion in the RUL anterior segment with 95% occlusion. There was extrinsic compression in the right middle lobe secondary to posterior mass effect. Pathology confirmed non-small cell lung cancer, favor adenocarcinoma.  PD-L1 testing revealed high expression (> 50%).  EGFR, ALK, and ROS1 were negative.  CEA was 2.3 on 03/27/2016.  PET scan on 04/05/2016 revealed intense hypermetabolism (SUV 17.95) associated with a 7.5 cm central perihilar right lung lesion. There was mediastinal (sub-carinal node SUV 8.89; 2.6 cm right paratracheal node SUV 17.49) and upper abdominal retroperitoneal hypermetabolic adenopathy (9 mm aortocaval node SUV 9.2). There were 2 liver metastasis (2.3 cm left lobe SUV 6.19;  2.2 cm segment VIII SUV 5.18). There are multifocal bone metastasis (C2 vertebral body SUV 9.0; 1.6 cm left sacral ala lesion SUV of 8.6).   He had marked leukocytosis with hypereosinophilia. Initial WBC was 72,300 with 53% eosinophils. Bone marrow on 03/29/2016 revealed marked increase in marrow eosinophils (approximate 30%). There was no diagnostic morphologic evidence of a myeloproliferative or lymphoid neoplasm. Flow cytometry revealed significant increase in eosinophils (54%). There was relative decreased myeloid cells with no significant immunophenotypic abnormalities or increase in blasts. There was no lymphoid abnormalities or evidence of clonality. FISH studies were negative for myeloproliferative neoplasms with eosinophilia (PDGFRA, PDGFRB, FGFR1). BCR-ABL was negative on 03/28/2016.  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  Left lower extremity duplex on 03/25/2016 revealed no evidence of DVT. Discomfort improved on Neurontin 300 mg a day and continues to improve with declining eosinophilia.  He received 41.4 Gy from 04/11/2016 - 05/23/2016.  He received 4 weeks of concurrent carboplatin and Taxol (04/17/2016 - 05/22/2016).    He has received 15 cycles of Keytruda (07/03/2016 - 10/12/2016; 11/22/2016 - 06/05/2017).  He has tolerated treatment well.  He receives [place] every 6 weeks (began 05/22/2016; last 06/26/2017).  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  He was on Xarelto from 04/13/2017 - 07/24/2017. Lovenox injections were not started on 07/13/2017 as ordered; ordered again to start 07/25/2017.   He has increased liver function tests.  Etiology is possibly secondary to acute hepatitis, irritation due to thrombosis, or progressive disease (metastatic liver lesion or [place]).  He is hepatitis B core antibody and hepatitis C antibody positive.  Hepatitis C RNA was 5.474 log10 IU/ml (298,000 IU/ml) on  07/03/2017.  Hepatitis C genotype is 1a.  AFP was 589.7 on 07/03/2017.  Chest, abdomen, and pelvic CT on 07/02/2017 revealed interval increase  in the left hepatic vein thrombosis with progressive heterogeneity and enhancement of the left hepatic lobe, predominantly obscuring the known left hepatic lobe lesion. There was a possible new 1.5 cm lateral left hepatic lobe lesion versus focal area of heterogeneity. There was slight interval decrease in the size of the irregular low-attenuation lesion within segment 8 of the liver. Right lung was stable in appearance with interval development of consolidation within the lingula representing atelectasis or infection.  Abdominal MRI on 07/05/2017 revealed persistent mass within lateral segment of left lobe of liver with progressive thrombosis of the left portal vein. There was also persistent thrombosis of the left hepatic vein. Portal vein thrombus extended up to the level of the main portal vein and was likely tumor thrombus. The liver has morphologic features suggestive of early cirrhosis. All findings are highly suggestive that the lesion in the left lobe of liver represents hepatoma rather than lung cancer metastasis. The lesion within segment 8 of the liver had decreased in size when compared with 04/13/2017.  The small lesion within segment 7 of the liver appeared new from previous exam. Indeterminate. This may represent an area of lung cancer metastasis or possibly multifocal HCC.  Ultrasound guided liver biopsy on 07/18/2017 revealed carcinoma c/w hepatocellular carcinoma.  He underwent arteriography, embolization and shunt calculations on 07/24/2017 in preparation for Y-90 radioembolization of his liver.  He underwent left accessory hepatic arteriography with chemotherapy embolization of accessory hepatic artery with doxorubicin-eluting beads on 08/20/2017. Tumor was targeted in the lateral segment of the left lobe of the liver.  AFP has been followed:  589.7 on 07/03/2017, 819.2 on 08/20/2017, and 475.6 on 09/07/2017.  PET scan on 09/17/2017 revealed the original 2 lesions shown on the prior exam are less cohesivly visible, there were new spreading confluent lesions in segments 1, 2, 3, 4, 5, and 8 of the liver compatible with progressive multifocal hepatic malignancy. 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  There was post therapy related findings in the lungs without active pulmonary or osseous metastatic disease identified.  Code status is DNR/DNI.  Symptomatically, he reports that he feels "ok".  He has chronic shortness of breath. Patient eating better and has gained 4 pounds. WBC is 7600 with an [place] of 5300. Hemoglobin 12.6, hematocrit 37.8, platelets 217,000.   Plan: 1.  Labs today:  CBC with diff, CMP,  Mg, AFP, TSH. 2.  Discuss plan to initiate lenvatinib.  Side effects reviewed including hypertension, edema, fatigue, increased QTc, nausea/vomiting/diarrhea/abdominal pain, low blood counts, liver dysfunction, fistula/performation, thyroid dysfunction, bleeding/thromboembolic events, perforation. Patient agrees to initiating therapy.  3.  Discuss prolonged QTc associated with Levanvatinib. Will perform baseline ECG today to determine pre-therapy baseline QTc interval. EKG showed NSR at a rate of 89. Pre-Lonsurf QTc is measured at 444m. 4.  Patient to meet with oral chemotherapy pharmacist to discuss Lenvatinib therapy today.  5.  RTC in 2 weeks for MD assessment, labs (CBC with diff, CMP, Mg). 6.  RTC in 4 weeks for MD assessment, labs (CBC with diff, CMP, Mg, AFP).   BHonor Loh NP  10/15/2017, 4:30 PM   I saw and evaluated the patient, participating in the key portions of the service and reviewing pertinent diagnostic studies and records.  I reviewed the nurse practitioner's note and agree with the findings and the plan.  The assessment and plan were discussed with the patient.  Several  questions were asked by the patient and answered.  Lequita Asal, MD 10/15/2017, 4:30 PM

## 2017-10-15 NOTE — Telephone Encounter (Signed)
Patient in clinic today.  Prescription sent to pharmacy for Potassium.  Prescription for Oxycodone printed and given to patient.  Spoke with patient about eating K+ rich foods.  Verbalized understanding.

## 2017-10-15 NOTE — Telephone Encounter (Signed)
-----   Message from Lequita Asal, MD sent at 10/15/2017  4:37 PM EST ----- Regarding: Potassium a little low  Oral potassium for a couple of days.  Discuss potassium rich foods.  M  ----- Message ----- From: Interface, Lab In [place] Sent: 10/15/2017   3:14 PM To: Lequita Asal, MD

## 2017-10-15 NOTE — Telephone Encounter (Signed)
Oral Chemotherapy Pharmacist Encounter Patient Education  I spoke with patient following his office visit for overview of new oral chemotherapy medication: Lenvima (lenvatinib) for the treatment of hepatocellular carcinoma, planned duration until disease progression or unacceptable drug toxicity.   Pt is doing well. Counseled patient on administration, dosing, side effects, monitoring, drug-food interactions, safe handling, storage, and disposal. Patient will take 2 capsules (8 mg total) by mouth daily.  Mr. Whitmoyer brought his medication with him to the visit today. He will get started on his medication today 10/15/17  Side effects include but not limited to: HTN, N/V/D, fatigue, mouth irritation, HA, abdominal pain.    Reviewed with patient importance of keeping a medication schedule and plan for any missed doses.  Mr. Sermon voiced understanding and appreciation. All questions answered.  Provided patient with [facility] phone number. Patient knows to call the office with questions or concerns. Oral Chemotherapy [facility] will continue to follow.  Thank you,  Darl Pikes, PharmD, BCPS Hematology/Oncology Clinical Pharmacist [facility] [phone]  10/15/2017 4:53 PM

## 2017-10-17 ENCOUNTER — Encounter: Payer: Self-pay | Admitting: Hematology and Oncology

## 2017-10-21 ENCOUNTER — Encounter: Payer: Self-pay | Admitting: Hematology and Oncology

## 2017-10-26 ENCOUNTER — Other Ambulatory Visit: Payer: Self-pay | Admitting: *Deleted

## 2017-10-26 DIAGNOSIS — C3411 Malignant neoplasm of upper lobe, right bronchus or lung: Secondary | ICD-10-CM

## 2017-10-29 ENCOUNTER — Other Ambulatory Visit: Payer: Self-pay | Admitting: *Deleted

## 2017-10-29 ENCOUNTER — Inpatient Hospital Stay: Payer: Medicare Other

## 2017-10-29 ENCOUNTER — Inpatient Hospital Stay (HOSPITAL_BASED_OUTPATIENT_CLINIC_OR_DEPARTMENT_OTHER): Payer: Medicare Other | Admitting: Hematology and Oncology

## 2017-10-29 ENCOUNTER — Other Ambulatory Visit: Payer: Self-pay

## 2017-10-29 VITALS — BP 99/68 | HR 85 | Temp 96.8°F | Resp 18 | Wt 127.0 lb

## 2017-10-29 DIAGNOSIS — M069 Rheumatoid arthritis, unspecified: Secondary | ICD-10-CM

## 2017-10-29 DIAGNOSIS — Z79899 Other long term (current) drug therapy: Secondary | ICD-10-CM

## 2017-10-29 DIAGNOSIS — R109 Unspecified abdominal pain: Secondary | ICD-10-CM

## 2017-10-29 DIAGNOSIS — R5383 Other fatigue: Secondary | ICD-10-CM | POA: Diagnosis not present

## 2017-10-29 DIAGNOSIS — D721 Eosinophilia: Secondary | ICD-10-CM | POA: Diagnosis not present

## 2017-10-29 DIAGNOSIS — R0602 Shortness of breath: Secondary | ICD-10-CM | POA: Diagnosis not present

## 2017-10-29 DIAGNOSIS — R634 Abnormal weight loss: Secondary | ICD-10-CM | POA: Diagnosis not present

## 2017-10-29 DIAGNOSIS — R110 Nausea: Secondary | ICD-10-CM

## 2017-10-29 DIAGNOSIS — R7989 Other specified abnormal findings of blood chemistry: Secondary | ICD-10-CM | POA: Diagnosis not present

## 2017-10-29 DIAGNOSIS — C3411 Malignant neoplasm of upper lobe, right bronchus or lung: Secondary | ICD-10-CM | POA: Diagnosis not present

## 2017-10-29 DIAGNOSIS — Z7901 Long term (current) use of anticoagulants: Secondary | ICD-10-CM

## 2017-10-29 DIAGNOSIS — C787 Secondary malignant neoplasm of liver and intrahepatic bile duct: Secondary | ICD-10-CM | POA: Diagnosis not present

## 2017-10-29 DIAGNOSIS — R945 Abnormal results of liver function studies: Secondary | ICD-10-CM

## 2017-10-29 DIAGNOSIS — Z8619 Personal history of other infectious and parasitic diseases: Secondary | ICD-10-CM

## 2017-10-29 DIAGNOSIS — C220 Liver cell carcinoma: Secondary | ICD-10-CM

## 2017-10-29 DIAGNOSIS — Z8701 Personal history of pneumonia (recurrent): Secondary | ICD-10-CM

## 2017-10-29 DIAGNOSIS — I998 Other disorder of circulatory system: Secondary | ICD-10-CM

## 2017-10-29 DIAGNOSIS — Z66 Do not resuscitate: Secondary | ICD-10-CM

## 2017-10-29 DIAGNOSIS — Z7189 Other specified counseling: Secondary | ICD-10-CM

## 2017-10-29 DIAGNOSIS — Z87891 Personal history of nicotine dependence: Secondary | ICD-10-CM

## 2017-10-29 DIAGNOSIS — Z01810 Encounter for preprocedural cardiovascular examination: Secondary | ICD-10-CM | POA: Diagnosis not present

## 2017-10-29 LAB — CBC WITH DIFFERENTIAL/PLATELET
Basophils Absolute: 0.1 10*3/uL (ref 0–0.1)
Basophils Relative: 1 %
Eosinophils Absolute: 0.1 10*3/uL (ref 0–0.7)
Eosinophils Relative: 3 %
HCT: 42.9 % (ref 40.0–52.0)
Hemoglobin: 14.2 g/dL (ref 13.0–18.0)
Lymphocytes Relative: 20 %
Lymphs Abs: 0.9 10*3/uL — ABNORMAL LOW (ref 1.0–3.6)
MCH: 32.4 pg (ref 26.0–34.0)
MCHC: 33.2 g/dL (ref 32.0–36.0)
MCV: 97.5 fL (ref 80.0–100.0)
Monocytes Absolute: 0.8 10*3/uL (ref 0.2–1.0)
Monocytes Relative: 17 %
Neutro Abs: 2.7 10*3/uL (ref 1.4–6.5)
Neutrophils Relative %: 59 %
Platelets: 241 10*3/uL (ref 150–440)
RBC: 4.4 MIL/uL (ref 4.40–5.90)
RDW: 15.5 % — ABNORMAL HIGH (ref 11.5–14.5)
WBC: 4.6 10*3/uL (ref 3.8–10.6)

## 2017-10-29 LAB — COMPREHENSIVE METABOLIC PANEL
ALT: 60 U/L (ref 17–63)
AST: 158 U/L — ABNORMAL HIGH (ref 15–41)
Albumin: 2.7 g/dL — ABNORMAL LOW (ref 3.5–5.0)
Alkaline Phosphatase: 138 U/L — ABNORMAL HIGH (ref 38–126)
Anion gap: 7 (ref 5–15)
BUN: 13 mg/dL (ref 6–20)
CO2: 27 mmol/L (ref 22–32)
Calcium: 9.1 mg/dL (ref 8.9–10.3)
Chloride: 101 mmol/L (ref 101–111)
Creatinine, Ser: 0.83 mg/dL (ref 0.61–1.24)
GFR calc Af Amer: >60 mL/min (ref >60)
GFR calc non Af Amer: >60 mL/min (ref >60)
Glucose, Bld: 123 mg/dL — ABNORMAL HIGH (ref 65–99)
Potassium: 3.9 mmol/L (ref 3.5–5.1)
Sodium: 135 mmol/L (ref 135–145)
Total Bilirubin: 1.4 mg/dL — ABNORMAL HIGH (ref 0.3–1.2)
Total Protein: 8.4 g/dL — ABNORMAL HIGH (ref 6.5–8.1)

## 2017-10-29 LAB — MAGNESIUM: Magnesium: 1.8 mg/dL (ref 1.7–2.4)

## 2017-10-29 MED ORDER — ONDANSETRON HCL 4 MG PO TABS: 4.0000 mg | ORAL_TABLET | Freq: Three times a day (TID) | ORAL | 0 refills | Status: AC | PRN

## 2017-10-29 NOTE — Progress Notes (Signed)
[facility] day:  10/29/2017   Chief Complaint: Gerald [name] is a 66 y.o. male with stage IV adenocarcinoma of the lung and hepatocellular carcinoma who is seen for 2 week assessment on lenvatinib.  HPI:  The patient was last seen in the medical oncology clinic on 11/052018.  At that time, he felt "ok".  He had chronic shortness of breath.  He was eating better and had gained 4 pounds. WBC was 7600 with an [place] of 5300. Hemoglobin was 12.6, hematocrit 37.8, platelets 217,000.   He began Lenvatinib on 10/16/2017.  During the interim, patient has been experiencing a poor appetite. He has had nausea. Patient has not taken anything for his nausea citing that it was "not that bad". Patient has lost 4 pounds since his last visit to the clinic. Patient denies diarrhea. He has not experienced any fevers or sweats. He denies recent infections. Patient notes that his energy is "very low".  He denies shortness of breath.  He endorses significant lower abdominal pain, mostly in the mornings. He is taking the prescribed Oxycodone IR 48m PRN.    Past Medical History:  Diagnosis Date  . Arthritis    Rheumatoid arthritis  . Cancer (HEcho    lung ca   . Collagen vascular disease (HCC)    RA  . Dyspnea   . Hepatitis    b and c  . Mass of lung   . Pneumonia   . Pneumothorax    spontaneous  . Renal disorder    as a child    Past Surgical History:  Procedure Laterality Date  . FLEXIBLE BRONCHOSCOPY N/A 03/30/2016   [facility]  . IR ANGIOGRAM SELECTIVE EACH ADDITIONAL VESSEL  07/24/2017  . IR ANGIOGRAM SELECTIVE EACH ADDITIONAL VESSEL  07/24/2017  . IR ANGIOGRAM SELECTIVE EACH ADDITIONAL VESSEL  07/24/2017  . IR ANGIOGRAM SELECTIVE EACH ADDITIONAL VESSEL  07/24/2017  . IR ANGIOGRAM SELECTIVE EACH ADDITIONAL VESSEL  07/24/2017  . IR ANGIOGRAM SELECTIVE EACH ADDITIONAL VESSEL  07/24/2017  . IR ANGIOGRAM SELECTIVE EACH ADDITIONAL  VESSEL  07/24/2017  . IR ANGIOGRAM SELECTIVE EACH ADDITIONAL VESSEL  08/20/2017  . IR ANGIOGRAM VISCERAL SELECTIVE  07/24/2017  . IR ANGIOGRAM VISCERAL SELECTIVE  08/20/2017  . IR EMBO ARTERIAL NOT HEMORR HEMANG INC GUIDE ROADMAPPING  07/24/2017  . IR EMBO TUMOR ORGAN ISCHEMIA INFARCT INC GUIDE ROADMAPPING  08/20/2017  . IR RADIOLOGIST EVAL & MGMT  07/11/2017  . IR [place] VASC ACCESS RIGHT  07/24/2017  . Ureter repair as a child      Family History  Problem Relation Age of Onset  . Brain cancer Mother   . Lung cancer Maternal Uncle   . Cancer Sister        Metastatic cancer- unknown primary    Social History:  reports that he quit smoking about 17 years ago. His smoking use included cigarettes. He has a 13.00 pack-year smoking history. he has never used smokeless tobacco. He reports that he drinks alcohol. He reports that he does not use drugs.  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  [place]  Contact number is [phone]  He has a great pyrenees.  The patient is alone today.  Allergies:  Allergies  Allergen Reactions  . Nsaids Other (See Comments)    Can take NSAIDs short term but cannot  be on it long term d/t renal insuff  . Sulfa Antibiotics Other (See Comments)    Reaction:  GI upset     Current Medications: Current Outpatient Medications  Medication Sig Dispense Refill  . calcium-vitamin D (OSCAL WITH D) 500-200 MG-UNIT tablet Take 1 tablet by mouth 2 (two) times daily. 180 tablet 0  . lenvatinib 8 mg daily dose (LENVIMA) 4 (2) MG capsule Take 2 capsules (8 mg total) by mouth daily. 60 capsule 0  . oxyCODONE (OXY IR/ROXICODONE) 5 MG immediate release tablet Take 1-2 tablets every 4 hours as needed for pain 40 tablet 0  . enoxaparin (LOVENOX) 60 MG/0.6ML injection Inject 0.6 mLs (60 mg total) into the skin every 12 (twelve) hours. (Patient not taking: Reported on 10/29/2017) 60 Syringe 2  . folic acid  (FOLVITE) 1 MG tablet Take 1 tablet (1 mg total) by mouth daily. (Patient not taking: Reported on 09/20/2017) 30 tablet 3  . potassium chloride SA (K-DUR,KLOR-CON) 20 MEQ tablet Take 1 tablet (20 mEq total) daily by mouth. (Patient not taking: Reported on 10/29/2017) 10 tablet 0   No current facility-administered medications for this visit.     Review of Systems:  GENERAL:  Feels "fatigue".  Low energy.  No fever, chills or sweats.  Weight down 4 pounds. PERFORMANCE STATUS (ECOG):  2 HEENT: No visual changes, sore throat, mouth sores or tenderness. Lungs:  Chronic shortness of breath or cough. No hemoptysis. Cardiac:  No chest pain, palpitations, orthopnea, or PND.  Sleeps in a recliner. GI:  Appetite poor. (+) nausea. No vomiting, diarrhea, constipation, melena or hematochezia. GU:  No urgency, frequency, dysuria, or hematuria. Musculoskeletal:  Chronic right knee pain. No back pain.  No muscle tenderness. Extremities:  No pain or swelling. Skin:  No rashes or ulcers. Neuro:  Neuropathy (stable) on gabapentin.  No headache, focal weakness, balance or coordination issues. Endocrine:  No diabetes, thyroid issues, hot flashes or night sweats. Psych:  No mood changes, depression or anxiety. Pain:  No pain today.  Review of systems:  All other systems reviewed and found to be negative.  Physical Exam: Blood pressure 99/68, pulse 85, temperature (!) 96.8 F (36 C), temperature source Tympanic, resp. rate 18, weight 127 lb (57.6 kg). GENERAL:  Thin gentleman sitting comfortably in the exam room in no acute distress. MENTAL STATUS:  Alert and oriented to person, place and time.  HEAD:Wearing a cap. Short gray hair. Gerald [name]. Temporal wasting. Normocephalic, atraumatic, face symmetric, no Cushingoid features. EYES:Blue eyes. Pupils equal round and reactive to light and accomodation. No conjunctivitis or scleral icterus. EKC:[id number] clear without lesion. Tonguenormal.  Mucous membranes dry. RESPIRATORY:Clear to auscultationwithout rales, wheezes or rhonchi. CARDIOVASCULAR:Regular rate andrhythmwithout murmur, rub or gallop. ABDOMEN:Soft, slightly tender over liver.  No guarding or rebound tenderness.  Active bowel sounds and no splenomegaly. No masses. SKIN: Dry.  No rashes. EXTREMITIES: No edema, no skin discoloration or tenderness. No palpable cords. LYMPHNODES: No palpable cervical, supraclavicular, axillary or inguinal adenopathy  NEUROLOGICAL: Unremarkable. PSYCH: Appropriate.    Imaging studies: 03/27/2016:  Chest CT angiogram revealed an 8 cm mass in the medial right upper lobe. The mass abutted the mediastinum and possibly invaded the left atrium. There was associated widespread thoracic adenopathy. There was a 2.6 cm enhancing lesion in the medial segment of the left hepatic lobe. 04/05/2016:  PET scan revealed intense hypermetabolism (SUV 17.95) associated with a 7.5 cm central perihilar right lung lesion. There was mediastinal (sub-carinal node SUV 8.89; 2.6 cm  right paratracheal node SUV 17.49) and upper abdominal retroperitoneal hypermetabolic adenopathy (9 mm aortocaval node SUV 9.2). There were 2 liver metastasis (2.3 cm left lobe SUV 6.19; 2.2 cm segment VIII SUV 5.18). There are multifocal bone metastasis (C2 vertebral body SUV 9.0; 1.6 cm left sacral ala lesion SUV of 8.6).  03/31/2016:  Head MRI was indeterminate for early metastatic disease to the brain. There was a small focus of gyral enhancement in the anterior right frontal lobe likely is post ischemic, while a small posterior right cerebellar enhancing lesion was more suspicious for small brain metastasis. There were multiple small primarily subacute appearing infarcts in the bilateral MCA and left PICA territories.  04/07/2016:  Bone scan revealed a subtle distal right femur lesion which might be a small metastasis.  Bone scan was felt to be an unreliable modality for  evaluation of osseous metastatic disease as the bone metastases seen on the recent PET and MRI were not evident. 05/12/2016:  Head MRI revealed expected interval evolution of bilateral cerebral in cerebellar infarcts. There was resolution of right frontal and right cerebellar enhancement consistent with subacute infarcts. There was a new 3 mm focus of gyral enhancement in the left occipital lobe and a possible new 4 mm enhancing lesion in the left cerebellum (? vascular enhancement versus tiny metastasis).  08/16/2016:  Head MRI revealed multiple areas of improving enhancement and restricted diffusion compatible with resolving subacute infarcts.  There was no evidence of metastatic disease or acute infarction  10/19/2016:  Chest, abdomen, and pelvic CT scan revealed radiation changes in the right lung.  The superimposed right lung pneumonia had improved.  The 6.4 x 2.4 cm right perihilar mass had decreased.  There was improving thoracic lymphadenopathy.  There was progression of hepatic metastases, measuring up to 4.7 cm.  Specifically, there was a 3.9 x 3.7 cm metastasis in segment 8 (previously 2.5 x 2.8 cm) and a 4.7 x 4.0 cm metastasis in segment 4B (previously 3.1 x 2.6 cm). 11/14/2016:  PET scan revealed 2 liver masses. Both were significantly more hypermetabolic than on 88/10/314 and have enlarged compared to the prior PET-CT.  The segment 8 lesion was centrally necrotic but with high activity along its periphery (7.4), and about the same size as it was on 10/19/2016. The lateral segment left hepatic lobe lesion had enlarged (5.9 x 5.3 cm) compared to 10/19/16 (4.1 x 4.4 cm).  The bony metastatic lesions were no longer hypermetabolic and have resolved.  There were post therapy and postoperative findings in the right lung, with a considerable amount of suspected radiation pneumonitis and radiation fibrosis. 12/29/2016:  Chest, abdomen, and pelvic CT revealed mild reduction in size and central necrosis  of the hepatic metastatic lesions.  There was continued post therapy related findings along the right hilar region. Dominant right perihilar mass was reduced in thickness compared to 10/19/2016.  04/13/2017:  Chest, abdomen, and pelvic CT revealed new left hepatic vein thrombosis. This somewhat obscured the known left hepatic lobe tumor although overall the left hepatic lobe masses were thought to be similar in size compared the prior exam. The segment 8 lesion was less well seen than on the prior exam.  The central necrotic portion was seen but the peripheral rim was much less conspicuous (could represent improvement in this tumor).  There was mild increase in adenopathy in the gastrohepatic ligament.  There was essentially stable appearance in the lungs with considerable radiation pneumonitis, scarring, and volume loss in the right lung, along  with a trace loculated pleural effusion with enhancing margins.  There was stable appearance of prior splenic infarcts and prior scarring in the left mid kidney. 07/02/2017:  Chest, abdomen, and pelvic CT revealed interval increase in the left hepatic vein thrombosis with progressive heterogeneity and enhancement of the left hepatic lobe, predominantly obscuring the known left hepatic lobe lesion. There was a possible new 1.5 cm lateral left hepatic lobe lesion versus focal area of heterogeneity. There was slight interval decrease in the size of the irregular low-attenuation lesion within segment 8 of the liver. Right lung was stable in appearance with interval development of consolidation within the lingula representing atelectasis or infection. 07/05/2017:  Abdominal MRI revealed persistent mass within lateral segment of left lobe of liver with progressive thrombosis of the left portal vein. There was also persistent thrombosis of the left hepatic vein. Portal vein thrombus extended up to the level of the main portal vein and was likely tumor thrombus. The liver has  morphologic features suggestive of early cirrhosis. All findings are highly suggestive that the lesion in the left lobe of liver represents hepatoma rather than lung cancer metastasis. The lesion within segment 8 of the liver had decreased in size when compared with 04/13/2017.  The small lesion within segment 7 of the liver appeared new from previous exam. Indeterminate. This may represent an area of lung cancer metastasis or possibly multifocal [place] 09/17/2017:  PET scan revealed the original 2 lesions shown on the prior exam are less cohesivly visible, there were new spreading confluent lesions in segments 1, 2, 3, 4, 5, and 8 of the liver compatible with progressive multifocal hepatic malignancy. 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  There was post therapy related findings in the lungs without active pulmonary or osseous metastatic disease identified.   Appointment on 10/29/2017  Component Date Value Ref Range Status  . Magnesium 10/29/2017 1.8  1.7 - 2.4 mg/dL Final  . Sodium 10/29/2017 135  135 - 145 mmol/L Final  . Potassium 10/29/2017 3.9  3.5 - 5.1 mmol/L Final  . Chloride 10/29/2017 101  101 - 111 mmol/L Final  . CO2 10/29/2017 27  22 - 32 mmol/L Final  . Glucose, Bld 10/29/2017 123* 65 - 99 mg/dL Final  . BUN 10/29/2017 13  6 - 20 mg/dL Final  . Creatinine, Ser 10/29/2017 0.83  0.61 - 1.24 mg/dL Final  . Calcium 10/29/2017 9.1  8.9 - 10.3 mg/dL Final  . Total Protein 10/29/2017 8.4* 6.5 - 8.1 g/dL Final  . Albumin 10/29/2017 2.7* 3.5 - 5.0 g/dL Final  . AST 10/29/2017 158* 15 - 41 U/L Final  . ALT 10/29/2017 60  17 - 63 U/L Final  . Alkaline Phosphatase 10/29/2017 138* 38 - 126 U/L Final  . Total Bilirubin 10/29/2017 1.4* 0.3 - 1.2 mg/dL Final  . GFR calc non Af Amer 10/29/2017 >60  >60 mL/min Final  . GFR calc Af Amer 10/29/2017 >60  >60 mL/min Final   Comment: (NOTE) The eGFR has been calculated using the CKD EPI equation. This  calculation has not been validated in all clinical situations. eGFR's persistently <60 mL/min signify possible Chronic Kidney Disease.   . Anion gap 10/29/2017 7  5 - 15 Final  . WBC 10/29/2017 4.6  3.8 - 10.6 K/uL Final  . RBC 10/29/2017 4.40  4.40 - 5.90 MIL/uL Final  . Hemoglobin 10/29/2017 14.2  13.0 - 18.0 g/dL Final  . HCT 10/29/2017 42.9  40.0 - 52.0 % Final  . MCV 10/29/2017 97.5  80.0 - 100.0 fL Final  . MCH 10/29/2017 32.4  26.0 - 34.0 pg Final  . MCHC 10/29/2017 33.2  32.0 - 36.0 g/dL Final  . RDW 10/29/2017 15.5* 11.5 - 14.5 % Final  . Platelets 10/29/2017 241  150 - 440 K/uL Final  . Neutrophils Relative % 10/29/2017 59  % Final  . Neutro Abs 10/29/2017 2.7  1.4 - 6.5 K/uL Final  . Lymphocytes Relative 10/29/2017 20  % Final  . Lymphs Abs 10/29/2017 0.9* 1.0 - 3.6 K/uL Final  . Monocytes Relative 10/29/2017 17  % Final  . Monocytes Absolute 10/29/2017 0.8  0.2 - 1.0 K/uL Final  . Eosinophils Relative 10/29/2017 3  % Final  . Eosinophils Absolute 10/29/2017 0.1  0 - 0.7 K/uL Final  . Basophils Relative 10/29/2017 1  % Final  . Basophils Absolute 10/29/2017 0.1  0 - 0.1 K/uL Final    Assessment:  Gerald [name] is a 66 y.o. male with stage IV adenocarcinoma of the lung and associated leukocytosis with eosinophilia. He presented with a 30 pound weight loss, shortness of breath, cough, and left leg discomfort. He has a 13-15 pack year smoking history.  Bronchoscopy on 03/30/2016 revealed an endobronchial lesion in the RUL anterior segment with 95% occlusion. There was extrinsic compression in the right middle lobe secondary to posterior mass effect. Pathology confirmed non-small cell lung cancer, favor adenocarcinoma.  PD-L1 testing revealed high expression (> 50%).  EGFR, ALK, and ROS1 were negative.  CEA was 2.3 on 03/27/2016.  PET scan on 04/05/2016 revealed intense hypermetabolism (SUV 17.95) associated with a 7.5 cm central perihilar right lung lesion. There was  mediastinal (sub-carinal node SUV 8.89; 2.6 cm right paratracheal node SUV 17.49) and upper abdominal retroperitoneal hypermetabolic adenopathy (9 mm aortocaval node SUV 9.2). There were 2 liver metastasis (2.3 cm left lobe SUV 6.19; 2.2 cm segment VIII SUV 5.18). There are multifocal bone metastasis (C2 vertebral body SUV 9.0; 1.6 cm left sacral ala lesion SUV of 8.6).   He had marked leukocytosis with hypereosinophilia. Initial WBC was 72,300 with 53% eosinophils. Bone marrow on 03/29/2016 revealed marked increase in marrow eosinophils (approximate 30%). There was no diagnostic morphologic evidence of a myeloproliferative or lymphoid neoplasm. Flow cytometry revealed significant increase in eosinophils (54%). There was relative decreased myeloid cells with no significant immunophenotypic abnormalities or increase in blasts. There was no lymphoid abnormalities or evidence of clonality. FISH studies were negative for myeloproliferative neoplasms with eosinophilia (PDGFRA, PDGFRB, FGFR1). BCR-ABL was negative on 03/28/2016.  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  Left lower extremity duplex on 03/25/2016 revealed no evidence of DVT. Discomfort improved on Neurontin 300 mg a day and continues to improve with declining eosinophilia.  He received 41.4 Gy from 04/11/2016 - 05/23/2016.  He received 4 weeks of concurrent carboplatin and Taxol (04/17/2016 - 05/22/2016).    He has received 15 cycles of Keytruda (07/03/2016 - 10/12/2016; 11/22/2016 - 06/05/2017).  He has tolerated treatment well.  He receives [place] every 6 weeks (began 05/22/2016; last 06/26/2017).  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  He was on Xarelto from 04/13/2017 - 07/24/2017. Lovenox injections were not started on 07/13/2017 as ordered; ordered again to start 07/25/2017.   He has increased liver function tests.  Etiology is possibly secondary  to acute hepatitis, irritation due to thrombosis, or progressive disease (metastatic  liver lesion or [place]).  He is hepatitis B core antibody and hepatitis C antibody positive.  Hepatitis C RNA was 5.474 log10 IU/ml (298,000 IU/ml) on 07/03/2017.  Hepatitis C genotype is 1a.  AFP was 589.7 on 07/03/2017.  Chest, abdomen, and pelvic CT on 07/02/2017 revealed interval increase in the left hepatic vein thrombosis with progressive heterogeneity and enhancement of the left hepatic lobe, predominantly obscuring the known left hepatic lobe lesion. There was a possible new 1.5 cm lateral left hepatic lobe lesion versus focal area of heterogeneity. There was slight interval decrease in the size of the irregular low-attenuation lesion within segment 8 of the liver. Right lung was stable in appearance with interval development of consolidation within the lingula representing atelectasis or infection.  Abdominal MRI on 07/05/2017 revealed persistent mass within lateral segment of left lobe of liver with progressive thrombosis of the left portal vein. There was also persistent thrombosis of the left hepatic vein. Portal vein thrombus extended up to the level of the main portal vein and was likely tumor thrombus. The liver has morphologic features suggestive of early cirrhosis. All findings are highly suggestive that the lesion in the left lobe of liver represents hepatoma rather than lung cancer metastasis. The lesion within segment 8 of the liver had decreased in size when compared with 04/13/2017.  The small lesion within segment 7 of the liver appeared new from previous exam. Indeterminate. This may represent an area of lung cancer metastasis or possibly multifocal HCC.  Ultrasound guided liver biopsy on 07/18/2017 revealed carcinoma c/w hepatocellular carcinoma.  He underwent arteriography, embolization and shunt calculations on 07/24/2017 in preparation for Y-90 radioembolization of his liver.  He underwent left  accessory hepatic arteriography with chemotherapy embolization of accessory hepatic artery with doxorubicin-eluting beads on 08/20/2017. Tumor was targeted in the lateral segment of the left lobe of the liver.  AFP has been followed: 589.7 on 07/03/2017, 819.2 on 08/20/2017, 475.6 on 09/07/2017, and 1188 on 10/19/2017.  PET scan on 09/17/2017 revealed the original 2 lesions shown on the prior exam are less cohesivly visible, there were new spreading confluent lesions in segments 1, 2, 3, 4, 5, and 8 of the liver compatible with progressive multifocal hepatic malignancy. 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  There was post therapy related findings in the lungs without active pulmonary or osseous metastatic disease identified.  He began lenvatinib on 10/16/2017.  Code status is DNR/DNI.  Symptomatically, he is "fatigued".  He has a poor appetite and has lost 4 pounds. WBC is 4600 with an [place] of 2700. Hemoglobin 14.2, hematocrit 42.9,  platelets 241,000. LFTs are elevated.   Plan: 1.  Labs today:  CBC with diff, CMP, Mg, AFP. 2.  Continue lenvatinib daily.  3.  Discuss weight loss. Patient has a poor appetite secondary to nausea. Encourage patient to increase protein and calorie intake. Recommended supplement shakes.  4.  Discuss nausea. Patient can't recall taking antiemetic in the past. Will Rx: Zofran 54m q8h PRN (Disp # 30). 5.  Discuss pain control. Patient is taking the prescribed Oxycodone 544mq4h PRN. He notes that this intervention is adequate in managing his pain.  6.  Discuss prolonged QTc associated with levanvatinib. Will perform follow up ECG today to determine QTc interval while patient on therapy. EKG showed sinus rhythm with occasional PVCs at a rate of 74.  Post-Lonsurf QTc is measured at 46364m7.  RTC in 1 week for MD assessment  and labs (CBC with diff, CMP, Mg).   Honor Loh, NP  10/29/2017, 3:57 PM   Addendum:  LFT's have  increased.  AST is 3.8 x ULN (pre-treatment 2.7 x ULN) and bilirubin 1.16 x ULN.  AST elevation is grade II.  Bilirubin elevation is grade I.  I saw and evaluated the patient, participating in the key portions of the service and reviewing pertinent diagnostic studies and records.  I reviewed the nurse practitioner's note and agree with the findings and the plan.  The assessment and plan were discussed with the patient.  Several questions were asked by the patient and answered.   Lequita Asal, MD 10/29/2017, 3:57 PM

## 2017-10-29 NOTE — Progress Notes (Signed)
Pt in for follow up, reports having nausea for 4 days, unable to eat.  Pt also reports having difficulty going to sleep at night.  Using pain med twice a day.  No pain at report during visit today.Marland Kitchen

## 2017-10-30 LAB — AFP TUMOR MARKER: AFP, Serum, Tumor Marker: 1188 ng/mL — ABNORMAL HIGH (ref 0.0–8.3)

## 2017-11-01 ENCOUNTER — Encounter: Payer: Self-pay | Admitting: Hematology and Oncology

## 2017-11-06 ENCOUNTER — Inpatient Hospital Stay (HOSPITAL_BASED_OUTPATIENT_CLINIC_OR_DEPARTMENT_OTHER): Payer: Medicare Other | Admitting: Hematology and Oncology

## 2017-11-06 ENCOUNTER — Other Ambulatory Visit: Payer: Self-pay

## 2017-11-06 ENCOUNTER — Inpatient Hospital Stay: Payer: Medicare Other

## 2017-11-06 VITALS — BP 119/74 | HR 119 | Temp 96.1°F | Resp 20 | Wt 128.2 lb

## 2017-11-06 DIAGNOSIS — R634 Abnormal weight loss: Secondary | ICD-10-CM | POA: Diagnosis not present

## 2017-11-06 DIAGNOSIS — I998 Other disorder of circulatory system: Secondary | ICD-10-CM

## 2017-11-06 DIAGNOSIS — C787 Secondary malignant neoplasm of liver and intrahepatic bile duct: Secondary | ICD-10-CM | POA: Diagnosis not present

## 2017-11-06 DIAGNOSIS — Z79899 Other long term (current) drug therapy: Secondary | ICD-10-CM

## 2017-11-06 DIAGNOSIS — C220 Liver cell carcinoma: Secondary | ICD-10-CM | POA: Diagnosis not present

## 2017-11-06 DIAGNOSIS — Z8619 Personal history of other infectious and parasitic diseases: Secondary | ICD-10-CM

## 2017-11-06 DIAGNOSIS — R0602 Shortness of breath: Secondary | ICD-10-CM

## 2017-11-06 DIAGNOSIS — G893 Neoplasm related pain (acute) (chronic): Secondary | ICD-10-CM

## 2017-11-06 DIAGNOSIS — R7989 Other specified abnormal findings of blood chemistry: Secondary | ICD-10-CM

## 2017-11-06 DIAGNOSIS — M069 Rheumatoid arthritis, unspecified: Secondary | ICD-10-CM

## 2017-11-06 DIAGNOSIS — Z8701 Personal history of pneumonia (recurrent): Secondary | ICD-10-CM

## 2017-11-06 DIAGNOSIS — C3411 Malignant neoplasm of upper lobe, right bronchus or lung: Secondary | ICD-10-CM

## 2017-11-06 DIAGNOSIS — B1920 Unspecified viral hepatitis C without hepatic coma: Secondary | ICD-10-CM

## 2017-11-06 DIAGNOSIS — D721 Eosinophilia: Secondary | ICD-10-CM

## 2017-11-06 DIAGNOSIS — B1910 Unspecified viral hepatitis B without hepatic coma: Secondary | ICD-10-CM

## 2017-11-06 DIAGNOSIS — R5383 Other fatigue: Secondary | ICD-10-CM | POA: Diagnosis not present

## 2017-11-06 DIAGNOSIS — Z66 Do not resuscitate: Secondary | ICD-10-CM

## 2017-11-06 DIAGNOSIS — R109 Unspecified abdominal pain: Secondary | ICD-10-CM

## 2017-11-06 DIAGNOSIS — Z7189 Other specified counseling: Secondary | ICD-10-CM

## 2017-11-06 DIAGNOSIS — R110 Nausea: Secondary | ICD-10-CM | POA: Diagnosis not present

## 2017-11-06 DIAGNOSIS — Z87891 Personal history of nicotine dependence: Secondary | ICD-10-CM

## 2017-11-06 DIAGNOSIS — Z7901 Long term (current) use of anticoagulants: Secondary | ICD-10-CM

## 2017-11-06 LAB — CBC WITH DIFFERENTIAL/PLATELET
Basophils Absolute: 0.1 10*3/uL (ref 0–0.1)
Basophils Relative: 2 %
Eosinophils Absolute: 0.1 10*3/uL (ref 0–0.7)
Eosinophils Relative: 3 %
HCT: 40.2 % (ref 40.0–52.0)
Hemoglobin: 13.3 g/dL (ref 13.0–18.0)
Lymphocytes Relative: 22 %
Lymphs Abs: 0.9 10*3/uL — ABNORMAL LOW (ref 1.0–3.6)
MCH: 32.1 pg (ref 26.0–34.0)
MCHC: 33.2 g/dL (ref 32.0–36.0)
MCV: 96.7 fL (ref 80.0–100.0)
Monocytes Absolute: 0.6 10*3/uL (ref 0.2–1.0)
Monocytes Relative: 14 %
Neutro Abs: 2.5 10*3/uL (ref 1.4–6.5)
Neutrophils Relative %: 59 %
Platelets: 189 10*3/uL (ref 150–440)
RBC: 4.16 MIL/uL — ABNORMAL LOW (ref 4.40–5.90)
RDW: 16.1 % — ABNORMAL HIGH (ref 11.5–14.5)
WBC: 4.2 10*3/uL (ref 3.8–10.6)

## 2017-11-06 LAB — COMPREHENSIVE METABOLIC PANEL
ALT: 63 U/L (ref 17–63)
AST: 145 U/L — ABNORMAL HIGH (ref 15–41)
Albumin: 2.6 g/dL — ABNORMAL LOW (ref 3.5–5.0)
Alkaline Phosphatase: 150 U/L — ABNORMAL HIGH (ref 38–126)
Anion gap: 5 (ref 5–15)
BUN: 16 mg/dL (ref 6–20)
CO2: 26 mmol/L (ref 22–32)
Calcium: 8.1 mg/dL — ABNORMAL LOW (ref 8.9–10.3)
Chloride: 108 mmol/L (ref 101–111)
Creatinine, Ser: 0.64 mg/dL (ref 0.61–1.24)
GFR calc Af Amer: >60 mL/min (ref >60)
GFR calc non Af Amer: >60 mL/min (ref >60)
Glucose, Bld: 104 mg/dL — ABNORMAL HIGH (ref 65–99)
Potassium: 3.6 mmol/L (ref 3.5–5.1)
Sodium: 139 mmol/L (ref 135–145)
Total Bilirubin: 1.3 mg/dL — ABNORMAL HIGH (ref 0.3–1.2)
Total Protein: 7.9 g/dL (ref 6.5–8.1)

## 2017-11-06 LAB — MAGNESIUM: Magnesium: 1.9 mg/dL (ref 1.7–2.4)

## 2017-11-06 NOTE — Progress Notes (Signed)
[facility] day:  11/06/2017   Chief Complaint: Gerald [name] is a 66 y.o. male with stage IV adenocarcinoma of the lung and hepatocellular carcinoma on lenvatinib who is seen for 1 week assessment.  HPI:  The patient was last seen in the medical oncology clinic on 10/29/2017.  At that time, he was "fatigued".  He had a poor appetite and had lost 4 pounds. WBC was 4600 with an Standard of 2700. Hemoglobin was 14.2, hematocrit 42.9,  platelets 241,000. LFTs were elevated (grade I-II).  He continued lenvatinib.  During the interim, patient has been doing "fine". He remains fatigued. Patient denies increased shortness of breath. She is eating "better". Weight has increased 1 pound. Patient has not experienced B symptoms. He denies interval infections.    Past Medical History:  Diagnosis Date  . Arthritis    Rheumatoid arthritis  . Cancer ([facility])    lung ca   . Collagen vascular disease ([facility])    RA  . Dyspnea   . Hepatitis    b and c  . Mass of lung   . Pneumonia   . Pneumothorax    spontaneous  . Renal disorder    as a child    Past Surgical History:  Procedure Laterality Date  . FLEXIBLE BRONCHOSCOPY N/A 03/30/2016   Procedure: FLEXIBLE BRONCHOSCOPY;  Surgeon: Vilinda Boehringer, MD;  Location: [facility] ORS;  Service: Cardiopulmonary;  Laterality: N/A;  . IR ANGIOGRAM SELECTIVE EACH ADDITIONAL VESSEL  07/24/2017  . IR ANGIOGRAM SELECTIVE EACH ADDITIONAL VESSEL  07/24/2017  . IR ANGIOGRAM SELECTIVE EACH ADDITIONAL VESSEL  07/24/2017  . IR ANGIOGRAM SELECTIVE EACH ADDITIONAL VESSEL  07/24/2017  . IR ANGIOGRAM SELECTIVE EACH ADDITIONAL VESSEL  07/24/2017  . IR ANGIOGRAM SELECTIVE EACH ADDITIONAL VESSEL  07/24/2017  . IR ANGIOGRAM SELECTIVE EACH ADDITIONAL VESSEL  07/24/2017  . IR ANGIOGRAM SELECTIVE EACH ADDITIONAL VESSEL  08/20/2017  . IR ANGIOGRAM VISCERAL SELECTIVE  07/24/2017  . IR ANGIOGRAM VISCERAL SELECTIVE  08/20/2017  . IR EMBO ARTERIAL NOT HEMORR  HEMANG INC GUIDE ROADMAPPING  07/24/2017  . IR EMBO TUMOR ORGAN ISCHEMIA INFARCT INC GUIDE ROADMAPPING  08/20/2017  . IR RADIOLOGIST EVAL & MGMT  07/11/2017  . IR US GUIDE VASC ACCESS RIGHT  07/24/2017  . Ureter repair as a child      Family History  Problem Relation Age of Onset  . Brain cancer Mother   . Lung cancer Maternal Uncle   . Cancer Sister        Metastatic cancer- unknown primary    Social History:  reports that he quit smoking about 17 years ago. His smoking use included cigarettes. He has a 13.00 pack-year smoking history. he has never used smokeless tobacco. He reports that he drinks alcohol. He reports that he does not use drugs.  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  He lives in [place].  Contact number is [phone].  He has a great pyrenees.  The patient is alone today.  Allergies:  Allergies  Allergen Reactions  . Nsaids Other (See Comments)    Can take NSAIDs short term but cannot be on it long term d/t renal insuff  . Sulfa Antibiotics Other (See Comments)    Reaction:  GI upset     Current Medications: Current Outpatient Medications  Medication Sig Dispense Refill  . calcium-vitamin D (OSCAL WITH D) 500-200 MG-UNIT tablet Take  1 tablet by mouth 2 (two) times daily. 180 tablet 0  . lenvatinib 8 mg daily dose (LENVIMA) 4 (2) MG capsule Take 2 capsules (8 mg total) by mouth daily. 60 capsule 0  . ondansetron (ZOFRAN) 4 MG tablet Take 1 tablet (4 mg total) every 8 (eight) hours as needed by mouth for nausea or vomiting. 30 tablet 0  . oxyCODONE (OXY IR/ROXICODONE) 5 MG immediate release tablet Take 1-2 tablets every 4 hours as needed for pain 40 tablet 0  . potassium chloride SA (K-DUR,KLOR-CON) 20 MEQ tablet Take 1 tablet (20 mEq total) daily by mouth. 10 tablet 0  . enoxaparin (LOVENOX) 60 MG/0.6ML injection Inject 0.6 mLs (60 mg total) into the skin every 12 (twelve) hours. (Patient not taking: Reported  on 10/29/2017) 60 Syringe 2  . folic acid (FOLVITE) 1 MG tablet Take 1 tablet (1 mg total) by mouth daily. (Patient not taking: Reported on 09/20/2017) 30 tablet 3   No current facility-administered medications for this visit.     Review of Systems:  GENERAL:  Feels "alright".  Fatigued.  No fever, chills or sweats.  Weight up 1 pound. PERFORMANCE STATUS (ECOG):  2 HEENT: No visual changes, sore throat, mouth sores or tenderness. Lungs:  Chronic shortness of breath or cough. No hemoptysis. Cardiac:  No chest pain, palpitations, orthopnea, or PND.  Sleeps in a recliner. GI:  Eating better. No vomiting, diarrhea, constipation, melena or hematochezia. GU:  No urgency, frequency, dysuria, or hematuria. Musculoskeletal:  Chronic right knee pain. No back pain.  No muscle tenderness. Extremities:  No pain or swelling. Skin:  No rashes or ulcers. Neuro:  Neuropathy on gabapentin.  No headache, focal weakness, balance or coordination issues. Endocrine:  No diabetes, thyroid issues, hot flashes or night sweats. Psych:  No mood changes, depression or anxiety. Pain:  No pain today.  Review of systems:  All other systems reviewed and found to be negative.  Physical Exam: Blood pressure 119/74, pulse (!) 119, temperature (!) 96.1 F (35.6 C), temperature source Tympanic, resp. rate 20, weight 128 lb 4 oz (58.2 kg). GENERAL:  Thin gentleman sitting comfortably in the exam room in no acute distress. MENTAL STATUS:  Alert and oriented to person, place and time.  HEAD:Wearing a cap. Short gray hair. Gerald [name]. Temporal wasting. Normocephalic, atraumatic, face symmetric, no Cushingoid features. EYES:Blue eyes. Pupils equal round and reactive to light and accomodation. No conjunctivitis or scleral icterus. EPP:IRJJOACZYS clear without lesion. Tonguenormal. Mucous membranes dry. RESPIRATORY:Clear to auscultationwithout rales, wheezes or rhonchi. CARDIOVASCULAR:Regular rate  andrhythmwithout murmur, rub or gallop. ABDOMEN:Soft, slightly tender over liver.  No guarding or rebound tenderness.  Active bowel sounds and no splenomegaly. No masses. SKIN: Dry.  No rashes. EXTREMITIES: No edema, no skin discoloration or tenderness. No palpable cords. LYMPHNODES: No palpable cervical, supraclavicular, axillary or inguinal adenopathy  NEUROLOGICAL: Unremarkable. PSYCH: Appropriate.    Imaging studies: 03/27/2016:  Chest CT angiogram revealed an 8 cm mass in the medial right upper lobe. The mass abutted the mediastinum and possibly invaded the left atrium. There was associated widespread thoracic adenopathy. There was a 2.6 cm enhancing lesion in the medial segment of the left hepatic lobe. 04/05/2016:  PET scan revealed intense hypermetabolism (SUV 17.95) associated with a 7.5 cm central perihilar right lung lesion. There was mediastinal (sub-carinal node SUV 8.89; 2.6 cm right paratracheal node SUV 17.49) and upper abdominal retroperitoneal hypermetabolic adenopathy (9 mm aortocaval node SUV 9.2). There were 2 liver metastasis (2.3 cm left lobe  SUV 6.19; 2.2 cm segment VIII SUV 5.18). There are multifocal bone metastasis (C2 vertebral body SUV 9.0; 1.6 cm left sacral ala lesion SUV of 8.6).  03/31/2016:  Head MRI was indeterminate for early metastatic disease to the brain. There was a small focus of gyral enhancement in the anterior right frontal lobe likely is post ischemic, while a small posterior right cerebellar enhancing lesion was more suspicious for small brain metastasis. There were multiple small primarily subacute appearing infarcts in the bilateral MCA and left PICA territories.  04/07/2016:  Bone scan revealed a subtle distal right femur lesion which might be a small metastasis.  Bone scan was felt to be an unreliable modality for evaluation of osseous metastatic disease as the bone metastases seen on the recent PET and MRI were not evident. 05/12/2016:   Head MRI revealed expected interval evolution of bilateral cerebral in cerebellar infarcts. There was resolution of right frontal and right cerebellar enhancement consistent with subacute infarcts. There was a new 3 mm focus of gyral enhancement in the left occipital lobe and a possible new 4 mm enhancing lesion in the left cerebellum (? vascular enhancement versus tiny metastasis).  08/16/2016:  Head MRI revealed multiple areas of improving enhancement and restricted diffusion compatible with resolving subacute infarcts.  There was no evidence of metastatic disease or acute infarction  10/19/2016:  Chest, abdomen, and pelvic CT scan revealed radiation changes in the right lung.  The superimposed right lung pneumonia had improved.  The 6.4 x 2.4 cm right perihilar mass had decreased.  There was improving thoracic lymphadenopathy.  There was progression of hepatic metastases, measuring up to 4.7 cm.  Specifically, there was a 3.9 x 3.7 cm metastasis in segment 8 (previously 2.5 x 2.8 cm) and a 4.7 x 4.0 cm metastasis in segment 4B (previously 3.1 x 2.6 cm). 11/14/2016:  PET scan revealed 2 liver masses. Both were significantly more hypermetabolic than on 65/68/1275 and have enlarged compared to the prior PET-CT.  The segment 8 lesion was centrally necrotic but with high activity along its periphery (7.4), and about the same size as it was on 10/19/2016. The lateral segment left hepatic lobe lesion had enlarged (5.9 x 5.3 cm) compared to 10/19/16 (4.1 x 4.4 cm).  The bony metastatic lesions were no longer hypermetabolic and have resolved.  There were post therapy and postoperative findings in the right lung, with a considerable amount of suspected radiation pneumonitis and radiation fibrosis. 12/29/2016:  Chest, abdomen, and pelvic CT revealed mild reduction in size and central necrosis of the hepatic metastatic lesions.  There was continued post therapy related findings along the right hilar region. Dominant  right perihilar mass was reduced in thickness compared to 10/19/2016.  04/13/2017:  Chest, abdomen, and pelvic CT revealed new left hepatic vein thrombosis. This somewhat obscured the known left hepatic lobe tumor although overall the left hepatic lobe masses were thought to be similar in size compared the prior exam. The segment 8 lesion was less well seen than on the prior exam.  The central necrotic portion was seen but the peripheral rim was much less conspicuous (could represent improvement in this tumor).  There was mild increase in adenopathy in the gastrohepatic ligament.  There was essentially stable appearance in the lungs with considerable radiation pneumonitis, scarring, and volume loss in the right lung, along with a trace loculated pleural effusion with enhancing margins.  There was stable appearance of prior splenic infarcts and prior scarring in the left mid kidney.  07/02/2017:  Chest, abdomen, and pelvic CT revealed interval increase in the left hepatic vein thrombosis with progressive heterogeneity and enhancement of the left hepatic lobe, predominantly obscuring the known left hepatic lobe lesion. There was a possible new 1.5 cm lateral left hepatic lobe lesion versus focal area of heterogeneity. There was slight interval decrease in the size of the irregular low-attenuation lesion within segment 8 of the liver. Right lung was stable in appearance with interval development of consolidation within the lingula representing atelectasis or infection. 07/05/2017:  Abdominal MRI revealed persistent mass within lateral segment of left lobe of liver with progressive thrombosis of the left portal vein. There was also persistent thrombosis of the left hepatic vein. Portal vein thrombus extended up to the level of the main portal vein and was likely tumor thrombus. The liver has morphologic features suggestive of early cirrhosis. All findings are highly suggestive that the lesion in the left lobe of liver  represents hepatoma rather than lung cancer metastasis. The lesion within segment 8 of the liver had decreased in size when compared with 04/13/2017.  The small lesion within segment 7 of the liver appeared new from previous exam. Indeterminate. This may represent an area of lung cancer metastasis or possibly multifocal [place] 09/17/2017:  PET scan revealed the original 2 lesions shown on the prior exam are less cohesivly visible, there were new spreading confluent lesions in segments 1, 2, 3, 4, 5, and 8 of the liver compatible with progressive multifocal hepatic malignancy. 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  There was post therapy related findings in the lungs without active pulmonary or osseous metastatic disease identified.   Orders Only on 11/06/2017  Component Date Value Ref Range Status  . Magnesium 11/06/2017 1.9  1.7 - 2.4 mg/dL Final  . Sodium 11/06/2017 139  135 - 145 mmol/L Final  . Potassium 11/06/2017 3.6  3.5 - 5.1 mmol/L Final  . Chloride 11/06/2017 108  101 - 111 mmol/L Final  . CO2 11/06/2017 26  22 - 32 mmol/L Final  . Glucose, Bld 11/06/2017 104* 65 - 99 mg/dL Final  . BUN 11/06/2017 16  6 - 20 mg/dL Final  . Creatinine, Ser 11/06/2017 0.64  0.61 - 1.24 mg/dL Final  . Calcium 11/06/2017 8.1* 8.9 - 10.3 mg/dL Final  . Total Protein 11/06/2017 7.9  6.5 - 8.1 g/dL Final  . Albumin 11/06/2017 2.6* 3.5 - 5.0 g/dL Final  . AST 11/06/2017 145* 15 - 41 U/L Final  . ALT 11/06/2017 63  17 - 63 U/L Final  . Alkaline Phosphatase 11/06/2017 150* 38 - 126 U/L Final  . Total Bilirubin 11/06/2017 1.3* 0.3 - 1.2 mg/dL Final  . GFR calc non Af Amer 11/06/2017 >60  >60 mL/min Final  . GFR calc Af Amer 11/06/2017 >60  >60 mL/min Final   Comment: (NOTE) The eGFR has been calculated using the CKD EPI equation. This calculation has not been validated in all clinical situations. eGFR's persistently <60 mL/min signify possible Chronic  Kidney Disease.   . Anion gap 11/06/2017 5  5 - 15 Final  . WBC 11/06/2017 4.2  3.8 - 10.6 K/uL Final  . RBC 11/06/2017 4.16* 4.40 - 5.90 MIL/uL Final  . Hemoglobin 11/06/2017 13.3  13.0 - 18.0 g/dL Final  . HCT 11/06/2017 40.2  40.0 - 52.0 % Final  . MCV 11/06/2017 96.7  80.0 - 100.0 fL Final  . MCH 11/06/2017 32.1  26.0 - 34.0  pg Final  . MCHC 11/06/2017 33.2  32.0 - 36.0 g/dL Final  . RDW 11/06/2017 16.1* 11.5 - 14.5 % Final  . Platelets 11/06/2017 189  150 - 440 K/uL Final  . Neutrophils Relative % 11/06/2017 59  % Final  . Neutro Abs 11/06/2017 2.5  1.4 - 6.5 K/uL Final  . Lymphocytes Relative 11/06/2017 22  % Final  . Lymphs Abs 11/06/2017 0.9* 1.0 - 3.6 K/uL Final  . Monocytes Relative 11/06/2017 14  % Final  . Monocytes Absolute 11/06/2017 0.6  0.2 - 1.0 K/uL Final  . Eosinophils Relative 11/06/2017 3  % Final  . Eosinophils Absolute 11/06/2017 0.1  0 - 0.7 K/uL Final  . Basophils Relative 11/06/2017 2  % Final  . Basophils Absolute 11/06/2017 0.1  0 - 0.1 K/uL Final    Assessment:  Ondre Salvetti is a 66 y.o. male with stage IV adenocarcinoma of the lung and associated leukocytosis with eosinophilia. He presented with a 30 pound weight loss, shortness of breath, cough, and left leg discomfort. He has a 13-15 pack year smoking history.  Bronchoscopy on 03/30/2016 revealed an endobronchial lesion in the RUL anterior segment with 95% occlusion. There was extrinsic compression in the right middle lobe secondary to posterior mass effect. Pathology confirmed non-small cell lung cancer, favor adenocarcinoma.  PD-L1 testing revealed high expression (> 50%).  EGFR, ALK, and ROS1 were negative.  CEA was 2.3 on 03/27/2016.  PET scan on 04/05/2016 revealed intense hypermetabolism (SUV 17.95) associated with a 7.5 cm central perihilar right lung lesion. There was mediastinal (sub-carinal node SUV 8.89; 2.6 cm right paratracheal node SUV 17.49) and upper abdominal retroperitoneal  hypermetabolic adenopathy (9 mm aortocaval node SUV 9.2). There were 2 liver metastasis (2.3 cm left lobe SUV 6.19; 2.2 cm segment VIII SUV 5.18). There are multifocal bone metastasis (C2 vertebral body SUV 9.0; 1.6 cm left sacral ala lesion SUV of 8.6).   He had marked leukocytosis with hypereosinophilia. Initial WBC was 72,300 with 53% eosinophils. Bone marrow on 03/29/2016 revealed marked increase in marrow eosinophils (approximate 30%). There was no diagnostic morphologic evidence of a myeloproliferative or lymphoid neoplasm. Flow cytometry revealed significant increase in eosinophils (54%). There was relative decreased myeloid cells with no significant immunophenotypic abnormalities or increase in blasts. There was no lymphoid abnormalities or evidence of clonality. FISH studies were negative for myeloproliferative neoplasms with eosinophilia (PDGFRA, PDGFRB, FGFR1). BCR-ABL was negative on 03/28/2016.  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  Left lower extremity duplex on 03/25/2016 revealed no evidence of DVT. Discomfort improved on Neurontin 300 mg a day and continues to improve with declining eosinophilia.  He received 41.4 Gy from 04/11/2016 - 05/23/2016.  He received 4 weeks of concurrent carboplatin and Taxol (04/17/2016 - 05/22/2016).    He has received 15 cycles of Keytruda (07/03/2016 - 10/12/2016; 11/22/2016 - 06/05/2017).  He has tolerated treatment well.  He receives [place] every 6 weeks (began 05/22/2016; last 06/26/2017).  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  He was on Xarelto from 04/13/2017 - 07/24/2017. Lovenox injections were not started on 07/13/2017 as ordered; ordered again to start 07/25/2017.   He has increased liver function tests.  Etiology is possibly secondary to acute hepatitis, irritation due to thrombosis, or progressive disease (metastatic liver lesion or [place]).  He is  hepatitis B core antibody and hepatitis C antibody positive.  Hepatitis C RNA was 5.474 log10 IU/ml (298,000  IU/ml) on 07/03/2017.  Hepatitis C genotype is 1a.  AFP was 589.7 on 07/03/2017.  Chest, abdomen, and pelvic CT on 07/02/2017 revealed interval increase in the left hepatic vein thrombosis with progressive heterogeneity and enhancement of the left hepatic lobe, predominantly obscuring the known left hepatic lobe lesion. There was a possible new 1.5 cm lateral left hepatic lobe lesion versus focal area of heterogeneity. There was slight interval decrease in the size of the irregular low-attenuation lesion within segment 8 of the liver. Right lung was stable in appearance with interval development of consolidation within the lingula representing atelectasis or infection.  Abdominal MRI on 07/05/2017 revealed persistent mass within lateral segment of left lobe of liver with progressive thrombosis of the left portal vein. There was also persistent thrombosis of the left hepatic vein. Portal vein thrombus extended up to the level of the main portal vein and was likely tumor thrombus. The liver has morphologic features suggestive of early cirrhosis. All findings are highly suggestive that the lesion in the left lobe of liver represents hepatoma rather than lung cancer metastasis. The lesion within segment 8 of the liver had decreased in size when compared with 04/13/2017.  The small lesion within segment 7 of the liver appeared new from previous exam. Indeterminate. This may represent an area of lung cancer metastasis or possibly multifocal HCC.  Ultrasound guided liver biopsy on 07/18/2017 revealed carcinoma c/w hepatocellular carcinoma.  He underwent arteriography, embolization and shunt calculations on 07/24/2017 in preparation for Y-90 radioembolization of his liver.  He underwent left accessory hepatic arteriography with chemotherapy embolization of accessory hepatic artery with doxorubicin-eluting  beads on 08/20/2017. Tumor was targeted in the lateral segment of the left lobe of the liver.  AFP has been followed: 589.7 on 07/03/2017, 819.2 on 08/20/2017, 475.6 on 09/07/2017, and 1188 on 10/29/2017.  PET scan on 09/17/2017 revealed the original 2 lesions shown on the prior exam are less cohesivly visible, there were new spreading confluent lesions in segments 1, 2, 3, 4, 5, and 8 of the liver compatible with progressive multifocal hepatic malignancy. 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  There was post therapy related findings in the lungs without active pulmonary or osseous metastatic disease identified.  He began lenvatinib on 10/16/2017.  Code status is DNR/DNI.  Symptomatically, he is "fatigued".  He has improved and has gained a pound since his last visit. WBC is 4200 with an [place] of 2500. Hemoglobin 14.3, hematocrit 40.2,  platelets 189,000. LFTs are elevated.   Plan: 1.  Labs today:  CBC with diff, CMP, Mg. 2.  Continue lenvatinib daily.  3.  Discuss need for potential reimaging due to increased LFTs. Will discuss at next visit.  4.  Discuss pain control. Patient is taking the prescribed Oxycodone 58m q4h PRN. He notes that this intervention is adequate in managing his pain.  5.  RTC on 11/23/2017 for MD assessment and labs (CBC with diff, CMP, Mg).   BHonor Loh NP  11/06/2017, 11:36 AM   I saw and evaluated the patient, participating in the key portions of the service and reviewing pertinent diagnostic studies and records.  I reviewed the nurse practitioner's note and agree with the findings and the plan.  The assessment and plan were discussed with the patient.  Several questions were asked by the patient and answered.   MLequita Asal MD 11/06/2017, 11:36 AM

## 2017-11-06 NOTE — Progress Notes (Signed)
Patient states when he inhales deeply he has pain across his abdomen. Otherwise, no complaints.

## 2017-11-08 ENCOUNTER — Encounter: Payer: Self-pay | Admitting: Hematology and Oncology

## 2017-11-12 ENCOUNTER — Telehealth: Payer: Self-pay | Admitting: Hematology and Oncology

## 2017-11-12 ENCOUNTER — Inpatient Hospital Stay: Payer: Medicare Other | Admitting: Hematology and Oncology

## 2017-11-12 ENCOUNTER — Inpatient Hospital Stay: Payer: Medicare Other

## 2017-11-12 NOTE — Telephone Encounter (Signed)
[facility] Patient Advocate Encounter  Patient was in for office visit, I had him sign for his re-enrollment thru [facility] for [facility] for 2019.  I faxed to [facility] Patient Advocate [phone] 11/12/2017 11:40 AM

## 2017-11-15 ENCOUNTER — Ambulatory Visit: Payer: Medicare Other | Attending: Radiation Oncology | Admitting: Radiation Oncology

## 2017-11-23 ENCOUNTER — Inpatient Hospital Stay: Payer: Medicare Other | Attending: Hematology and Oncology

## 2017-11-23 ENCOUNTER — Inpatient Hospital Stay (HOSPITAL_BASED_OUTPATIENT_CLINIC_OR_DEPARTMENT_OTHER): Payer: Medicare Other | Admitting: Hematology and Oncology

## 2017-11-23 VITALS — BP 118/75 | HR 104 | Temp 96.1°F | Resp 20 | Wt 126.3 lb

## 2017-11-23 DIAGNOSIS — Z801 Family history of malignant neoplasm of trachea, bronchus and lung: Secondary | ICD-10-CM

## 2017-11-23 DIAGNOSIS — Z8719 Personal history of other diseases of the digestive system: Secondary | ICD-10-CM | POA: Insufficient documentation

## 2017-11-23 DIAGNOSIS — Z79899 Other long term (current) drug therapy: Secondary | ICD-10-CM

## 2017-11-23 DIAGNOSIS — R109 Unspecified abdominal pain: Secondary | ICD-10-CM | POA: Insufficient documentation

## 2017-11-23 DIAGNOSIS — R110 Nausea: Secondary | ICD-10-CM | POA: Insufficient documentation

## 2017-11-23 DIAGNOSIS — R5383 Other fatigue: Secondary | ICD-10-CM

## 2017-11-23 DIAGNOSIS — C7800 Secondary malignant neoplasm of unspecified lung: Secondary | ICD-10-CM | POA: Insufficient documentation

## 2017-11-23 DIAGNOSIS — C220 Liver cell carcinoma: Secondary | ICD-10-CM

## 2017-11-23 DIAGNOSIS — I81 Portal vein thrombosis: Secondary | ICD-10-CM

## 2017-11-23 DIAGNOSIS — D721 Eosinophilia: Secondary | ICD-10-CM | POA: Insufficient documentation

## 2017-11-23 DIAGNOSIS — C3411 Malignant neoplasm of upper lobe, right bronchus or lung: Secondary | ICD-10-CM

## 2017-11-23 DIAGNOSIS — G629 Polyneuropathy, unspecified: Secondary | ICD-10-CM

## 2017-11-23 DIAGNOSIS — Z66 Do not resuscitate: Secondary | ICD-10-CM | POA: Diagnosis not present

## 2017-11-23 DIAGNOSIS — R635 Abnormal weight gain: Secondary | ICD-10-CM | POA: Insufficient documentation

## 2017-11-23 DIAGNOSIS — R948 Abnormal results of function studies of other organs and systems: Secondary | ICD-10-CM

## 2017-11-23 DIAGNOSIS — I820 Budd-Chiari syndrome: Secondary | ICD-10-CM

## 2017-11-23 DIAGNOSIS — C787 Secondary malignant neoplasm of liver and intrahepatic bile duct: Secondary | ICD-10-CM | POA: Diagnosis not present

## 2017-11-23 DIAGNOSIS — E871 Hypo-osmolality and hyponatremia: Secondary | ICD-10-CM | POA: Insufficient documentation

## 2017-11-23 DIAGNOSIS — Z86718 Personal history of other venous thrombosis and embolism: Secondary | ICD-10-CM | POA: Insufficient documentation

## 2017-11-23 DIAGNOSIS — R630 Anorexia: Secondary | ICD-10-CM | POA: Insufficient documentation

## 2017-11-23 DIAGNOSIS — B1920 Unspecified viral hepatitis C without hepatic coma: Secondary | ICD-10-CM

## 2017-11-23 DIAGNOSIS — R188 Other ascites: Secondary | ICD-10-CM | POA: Diagnosis not present

## 2017-11-23 DIAGNOSIS — G893 Neoplasm related pain (acute) (chronic): Secondary | ICD-10-CM

## 2017-11-23 DIAGNOSIS — C7951 Secondary malignant neoplasm of bone: Secondary | ICD-10-CM | POA: Insufficient documentation

## 2017-11-23 DIAGNOSIS — Z8701 Personal history of pneumonia (recurrent): Secondary | ICD-10-CM

## 2017-11-23 DIAGNOSIS — Z87891 Personal history of nicotine dependence: Secondary | ICD-10-CM

## 2017-11-23 DIAGNOSIS — R748 Abnormal levels of other serum enzymes: Secondary | ICD-10-CM

## 2017-11-23 DIAGNOSIS — M069 Rheumatoid arthritis, unspecified: Secondary | ICD-10-CM

## 2017-11-23 DIAGNOSIS — Z7189 Other specified counseling: Secondary | ICD-10-CM

## 2017-11-23 LAB — CBC WITH DIFFERENTIAL/PLATELET
Basophils Absolute: 0.1 10*3/uL (ref 0–0.1)
Basophils Relative: 2 %
Eosinophils Absolute: 0.2 10*3/uL (ref 0–0.7)
Eosinophils Relative: 3 %
HCT: 41.3 % (ref 40.0–52.0)
Hemoglobin: 13.7 g/dL (ref 13.0–18.0)
Lymphocytes Relative: 13 %
Lymphs Abs: 1 10*3/uL (ref 1.0–3.6)
MCH: 32.9 pg (ref 26.0–34.0)
MCHC: 33.1 g/dL (ref 32.0–36.0)
MCV: 99.4 fL (ref 80.0–100.0)
Monocytes Absolute: 1.1 10*3/uL — ABNORMAL HIGH (ref 0.2–1.0)
Monocytes Relative: 14 %
Neutro Abs: 5.2 10*3/uL (ref 1.4–6.5)
Neutrophils Relative %: 68 %
Platelets: 170 10*3/uL (ref 150–440)
RBC: 4.16 MIL/uL — ABNORMAL LOW (ref 4.40–5.90)
RDW: 16.7 % — ABNORMAL HIGH (ref 11.5–14.5)
WBC: 7.6 10*3/uL (ref 3.8–10.6)

## 2017-11-23 LAB — COMPREHENSIVE METABOLIC PANEL
ALT: 60 U/L (ref 17–63)
AST: 121 U/L — ABNORMAL HIGH (ref 15–41)
Albumin: 2.7 g/dL — ABNORMAL LOW (ref 3.5–5.0)
Alkaline Phosphatase: 201 U/L — ABNORMAL HIGH (ref 38–126)
Anion gap: 8 (ref 5–15)
BUN: 19 mg/dL (ref 6–20)
CO2: 25 mmol/L (ref 22–32)
Calcium: 8.7 mg/dL — ABNORMAL LOW (ref 8.9–10.3)
Chloride: 101 mmol/L (ref 101–111)
Creatinine, Ser: 0.72 mg/dL (ref 0.61–1.24)
GFR calc Af Amer: >60 mL/min (ref >60)
GFR calc non Af Amer: >60 mL/min (ref >60)
Glucose, Bld: 111 mg/dL — ABNORMAL HIGH (ref 65–99)
Potassium: 4.4 mmol/L (ref 3.5–5.1)
Sodium: 134 mmol/L — ABNORMAL LOW (ref 135–145)
Total Bilirubin: 3.8 mg/dL — ABNORMAL HIGH (ref 0.3–1.2)
Total Protein: 8.3 g/dL — ABNORMAL HIGH (ref 6.5–8.1)

## 2017-11-23 LAB — MAGNESIUM: Magnesium: 1.6 mg/dL — ABNORMAL LOW (ref 1.7–2.4)

## 2017-11-23 MED ORDER — OXYCODONE HCL 5 MG PO TABS: ORAL_TABLET | ORAL | 0 refills | Status: DC

## 2017-11-23 NOTE — Progress Notes (Signed)
Patient continues to have pain in upper abdomen when he coughs, sneezes, hiccups or takes a deep breath.

## 2017-11-23 NOTE — Progress Notes (Signed)
[facility] day:  11/23/2017   Chief Complaint: Gerald [name] is a 66 y.o. male with stage IV adenocarcinoma of the lung and hepatocellular carcinoma on lenvatinib who is seen for 2 week assessment.  HPI:  The patient was last seen in the medical oncology clinic on 11/06/2017.  At that time, he was "fatigued".  He had gained a pound since his last visit. WBC was 4200 with an ANC of 2500. Hemoglobin was 14.3, hematocrit 40.2,  platelets 189,000. LFTs included a AST 145, ALT 63, bilirubin 1.3 and alkaline phosphatase 150.  During the interim, patient is doing "ok". Patient continues to experience fatigue. Patient continues to lose weight; down 2 pounds since his last visit. Patient is on his Levatinib.  He experiences nausea and becomes "very lethargic" when he takes it 3 days in a row.   He then does not take it for a few days.  Patient denies fevers and sweats. He has had no recent infections.    Past Medical History:  Diagnosis Date  . Arthritis    Rheumatoid arthritis  . Cancer ([place])    lung ca   . Collagen vascular disease (HCC)    RA  . Dyspnea   . Hepatitis    b and c  . Mass of lung   . Pneumonia   . Pneumothorax    spontaneous  . Renal disorder    as a child    Past Surgical History:  Procedure Laterality Date  . FLEXIBLE BRONCHOSCOPY N/A 03/30/2016   Procedure: FLEXIBLE BRONCHOSCOPY;  Surgeon: Vilinda Boehringer, MD;  Location: [facility] ORS;  Service: Cardiopulmonary;  Laterality: N/A;  . IR ANGIOGRAM SELECTIVE EACH ADDITIONAL VESSEL  07/24/2017  . IR ANGIOGRAM SELECTIVE EACH ADDITIONAL VESSEL  07/24/2017  . IR ANGIOGRAM SELECTIVE EACH ADDITIONAL VESSEL  07/24/2017  . IR ANGIOGRAM SELECTIVE EACH ADDITIONAL VESSEL  07/24/2017  . IR ANGIOGRAM SELECTIVE EACH ADDITIONAL VESSEL  07/24/2017  . IR ANGIOGRAM SELECTIVE EACH ADDITIONAL VESSEL  07/24/2017  . IR ANGIOGRAM SELECTIVE EACH ADDITIONAL VESSEL  07/24/2017  . IR ANGIOGRAM SELECTIVE EACH  ADDITIONAL VESSEL  08/20/2017  . IR ANGIOGRAM VISCERAL SELECTIVE  07/24/2017  . IR ANGIOGRAM VISCERAL SELECTIVE  08/20/2017  . IR EMBO ARTERIAL NOT HEMORR HEMANG INC GUIDE ROADMAPPING  07/24/2017  . IR EMBO TUMOR ORGAN ISCHEMIA INFARCT INC GUIDE ROADMAPPING  08/20/2017  . IR RADIOLOGIST EVAL & MGMT  07/11/2017  . IR US GUIDE VASC ACCESS RIGHT  07/24/2017  . Ureter repair as a child      Family History  Problem Relation Age of Onset  . Brain cancer Mother   . Lung cancer Maternal Uncle   . Cancer Sister        Metastatic cancer- unknown primary    Social History:  reports that he quit smoking about 17 years ago. His smoking use included cigarettes. He has a 13.00 pack-year smoking history. he has never used smokeless tobacco. He reports that he drinks alcohol. He reports that he does not use drugs.  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  He lives in [place].  Contact number is [phone].  He has a great pyrenees.  The patient is alone today.  Allergies:  Allergies  Allergen Reactions  . Nsaids Other (See Comments)    Can take NSAIDs short term but cannot be on it long term d/t renal insuff  .  Sulfa Antibiotics Other (See Comments)    Reaction:  GI upset     Current Medications: Current Outpatient Medications  Medication Sig Dispense Refill  . calcium-vitamin D (OSCAL WITH D) 500-200 MG-UNIT tablet Take 1 tablet by mouth 2 (two) times daily. 180 tablet 0  . lenvatinib 8 mg daily dose (LENVIMA) 4 (2) MG capsule Take 2 capsules (8 mg total) by mouth daily. 60 capsule 0  . ondansetron (ZOFRAN) 4 MG tablet Take 1 tablet (4 mg total) every 8 (eight) hours as needed by mouth for nausea or vomiting. 30 tablet 0  . oxyCODONE (OXY IR/ROXICODONE) 5 MG immediate release tablet Take 1-2 tablets every 4 hours as needed for pain 40 tablet 0  . enoxaparin (LOVENOX) 60 MG/0.6ML injection Inject 0.6 mLs (60 mg total) into the skin every 12  (twelve) hours. (Patient not taking: Reported on 10/29/2017) 60 Syringe 2  . folic acid (FOLVITE) 1 MG tablet Take 1 tablet (1 mg total) by mouth daily. (Patient not taking: Reported on 09/20/2017) 30 tablet 3  . potassium chloride SA (K-DUR,KLOR-CON) 20 MEQ tablet Take 1 tablet (20 mEq total) daily by mouth. (Patient not taking: Reported on 11/23/2017) 10 tablet 0   No current facility-administered medications for this visit.     Review of Systems:  GENERAL:  Feels "ok".  Fatigued.  No fever, chills or sweats.  Weight down 2 pounds. PERFORMANCE STATUS (ECOG):  2 HEENT: No visual changes, sore throat, mouth sores or tenderness. Lungs:  Chronic shortness of breath.  No cough. No hemoptysis. Cardiac:  No chest pain, palpitations, orthopnea, or PND.  Sleeps in a recliner. GI:  Appetite poor. (+) nausea. No vomiting, diarrhea, constipation, melena or hematochezia. GU:  No urgency, frequency, dysuria, or hematuria. Musculoskeletal:  Chronic right knee pain. No back pain.  No muscle tenderness. Extremities:  No pain or swelling. Skin:  No rashes or ulcers. Neuro:  Neuropathy (stable) on gabapentin.  No headache, focal weakness, balance or coordination issues. Endocrine:  No diabetes, thyroid issues, hot flashes or night sweats. Psych:  No mood changes, depression or anxiety. Pain:  No pain today.  Review of systems:  All other systems reviewed and found to be negative.  Physical Exam: Blood pressure 118/75, pulse (!) 104, temperature (!) 96.1 F (35.6 C), temperature source Tympanic, resp. rate 20, weight 126 lb 5 oz (57.3 kg), SpO2 96 %. GENERAL:  Thin fatigued appearing gentleman sitting comfortably in the exam room in no acute distress. MENTAL STATUS:  Alert and oriented to person, place and time.  HEAD:Wearing a cap. Short gray hair. Albertina Parr. Temporal wasting. Normocephalic, atraumatic, face symmetric, no Cushingoid features. EYES:Blue eyes. Pupils equal round and reactive  to light and accomodation. No conjunctivitis or scleral icterus. WCB:JSEGBTDVVO clear without lesion. Tonguenormal. Mucous membranes dry. RESPIRATORY:Clear to auscultationwithout rales, wheezes or rhonchi. CARDIOVASCULAR:Regular rate andrhythmwithout murmur, rub or gallop. ABDOMEN:Soft, slightly tender over liver.  No guarding or rebound tenderness.  Active bowel sounds and no splenomegaly. No masses. SKIN: Dry.  No rashes. EXTREMITIES: No edema, no skin discoloration or tenderness. No palpable cords. LYMPHNODES: No palpable cervical, supraclavicular, axillary or inguinal adenopathy  NEUROLOGICAL: Unremarkable. PSYCH: Appropriate.    Imaging studies: 03/27/2016:  Chest CT angiogram revealed an 8 cm mass in the medial right upper lobe. The mass abutted the mediastinum and possibly invaded the left atrium. There was associated widespread thoracic adenopathy. There was a 2.6 cm enhancing lesion in the medial segment of the left hepatic lobe. 04/05/2016:  PET scan revealed intense hypermetabolism (SUV 17.95) associated with a 7.5 cm central perihilar right lung lesion. There was mediastinal (sub-carinal node SUV 8.89; 2.6 cm right paratracheal node SUV 17.49) and upper abdominal retroperitoneal hypermetabolic adenopathy (9 mm aortocaval node SUV 9.2). There were 2 liver metastasis (2.3 cm left lobe SUV 6.19; 2.2 cm segment VIII SUV 5.18). There are multifocal bone metastasis (C2 vertebral body SUV 9.0; 1.6 cm left sacral ala lesion SUV of 8.6).  03/31/2016:  Head MRI was indeterminate for early metastatic disease to the brain. There was a small focus of gyral enhancement in the anterior right frontal lobe likely is post ischemic, while a small posterior right cerebellar enhancing lesion was more suspicious for small brain metastasis. There were multiple small primarily subacute appearing infarcts in the bilateral MCA and left PICA territories.  04/07/2016:  Bone scan revealed a  subtle distal right femur lesion which might be a small metastasis.  Bone scan was felt to be an unreliable modality for evaluation of osseous metastatic disease as the bone metastases seen on the recent PET and MRI were not evident. 05/12/2016:  Head MRI revealed expected interval evolution of bilateral cerebral in cerebellar infarcts. There was resolution of right frontal and right cerebellar enhancement consistent with subacute infarcts. There was a new 3 mm focus of gyral enhancement in the left occipital lobe and a possible new 4 mm enhancing lesion in the left cerebellum (? vascular enhancement versus tiny metastasis).  08/16/2016:  Head MRI revealed multiple areas of improving enhancement and restricted diffusion compatible with resolving subacute infarcts.  There was no evidence of metastatic disease or acute infarction  10/19/2016:  Chest, abdomen, and pelvic CT scan revealed radiation changes in the right lung.  The superimposed right lung pneumonia had improved.  The 6.4 x 2.4 cm right perihilar mass had decreased.  There was improving thoracic lymphadenopathy.  There was progression of hepatic metastases, measuring up to 4.7 cm.  Specifically, there was a 3.9 x 3.7 cm metastasis in segment 8 (previously 2.5 x 2.8 cm) and a 4.7 x 4.0 cm metastasis in segment 4B (previously 3.1 x 2.6 cm). 11/14/2016:  PET scan revealed 2 liver masses. Both were significantly more hypermetabolic than on 58/08/9832 and have enlarged compared to the prior PET-CT.  The segment 8 lesion was centrally necrotic but with high activity along its periphery (7.4), and about the same size as it was on 10/19/2016. The lateral segment left hepatic lobe lesion had enlarged (5.9 x 5.3 cm) compared to 10/19/16 (4.1 x 4.4 cm).  The bony metastatic lesions were no longer hypermetabolic and have resolved.  There were post therapy and postoperative findings in the right lung, with a considerable amount of suspected radiation pneumonitis  and radiation fibrosis. 12/29/2016:  Chest, abdomen, and pelvic CT revealed mild reduction in size and central necrosis of the hepatic metastatic lesions.  There was continued post therapy related findings along the right hilar region. Dominant right perihilar mass was reduced in thickness compared to 10/19/2016.  04/13/2017:  Chest, abdomen, and pelvic CT revealed new left hepatic vein thrombosis. This somewhat obscured the known left hepatic lobe tumor although overall the left hepatic lobe masses were thought to be similar in size compared the prior exam. The segment 8 lesion was less well seen than on the prior exam.  The central necrotic portion was seen but the peripheral rim was much less conspicuous (could represent improvement in this tumor).  There was mild increase in adenopathy  in the gastrohepatic ligament.  There was essentially stable appearance in the lungs with considerable radiation pneumonitis, scarring, and volume loss in the right lung, along with a trace loculated pleural effusion with enhancing margins.  There was stable appearance of prior splenic infarcts and prior scarring in the left mid kidney. 07/02/2017:  Chest, abdomen, and pelvic CT revealed interval increase in the left hepatic vein thrombosis with progressive heterogeneity and enhancement of the left hepatic lobe, predominantly obscuring the known left hepatic lobe lesion. There was a possible new 1.5 cm lateral left hepatic lobe lesion versus focal area of heterogeneity. There was slight interval decrease in the size of the irregular low-attenuation lesion within segment 8 of the liver. Right lung was stable in appearance with interval development of consolidation within the lingula representing atelectasis or infection. 07/05/2017:  Abdominal MRI revealed persistent mass within lateral segment of left lobe of liver with progressive thrombosis of the left portal vein. There was also persistent thrombosis of the left hepatic  vein. Portal vein thrombus extended up to the level of the main portal vein and was likely tumor thrombus. The liver has morphologic features suggestive of early cirrhosis. All findings are highly suggestive that the lesion in the left lobe of liver represents hepatoma rather than lung cancer metastasis. The lesion within segment 8 of the liver had decreased in size when compared with 04/13/2017.  The small lesion within segment 7 of the liver appeared new from previous exam. Indeterminate. This may represent an area of lung cancer metastasis or possibly multifocal [place] 09/17/2017:  PET scan revealed the original 2 lesions shown on the prior exam are less cohesivly visible, there were new spreading confluent lesions in segments 1, 2, 3, 4, 5, and 8 of the liver compatible with progressive multifocal hepatic malignancy. 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  There was post therapy related findings in the lungs without active pulmonary or osseous metastatic disease identified.   Appointment on 11/23/2017  Component Date Value Ref Range Status  . Magnesium 11/23/2017 1.6* 1.7 - 2.4 mg/dL Final  . Sodium 11/23/2017 134* 135 - 145 mmol/L Final  . Potassium 11/23/2017 4.4  3.5 - 5.1 mmol/L Final  . Chloride 11/23/2017 101  101 - 111 mmol/L Final  . CO2 11/23/2017 25  22 - 32 mmol/L Final  . Glucose, Bld 11/23/2017 111* 65 - 99 mg/dL Final  . BUN 11/23/2017 19  6 - 20 mg/dL Final  . Creatinine, Ser 11/23/2017 0.72  0.61 - 1.24 mg/dL Final  . Calcium 11/23/2017 8.7* 8.9 - 10.3 mg/dL Final  . Total Protein 11/23/2017 8.3* 6.5 - 8.1 g/dL Final  . Albumin 11/23/2017 2.7* 3.5 - 5.0 g/dL Final  . AST 11/23/2017 121* 15 - 41 U/L Final  . ALT 11/23/2017 60  17 - 63 U/L Final  . Alkaline Phosphatase 11/23/2017 201* 38 - 126 U/L Final  . Total Bilirubin 11/23/2017 3.8* 0.3 - 1.2 mg/dL Final  . GFR calc non Af Amer 11/23/2017 >60  >60 mL/min Final  . GFR calc Af  Amer 11/23/2017 >60  >60 mL/min Final   Comment: (NOTE) The eGFR has been calculated using the CKD EPI equation. This calculation has not been validated in all clinical situations. eGFR's persistently <60 mL/min signify possible Chronic Kidney Disease.   . Anion gap 11/23/2017 8  5 - 15 Final  . WBC 11/23/2017 7.6  3.8 - 10.6 K/uL Final  . RBC 11/23/2017  4.16* 4.40 - 5.90 MIL/uL Final  . Hemoglobin 11/23/2017 13.7  13.0 - 18.0 g/dL Final  . HCT 11/23/2017 41.3  40.0 - 52.0 % Final  . MCV 11/23/2017 99.4  80.0 - 100.0 fL Final  . MCH 11/23/2017 32.9  26.0 - 34.0 pg Final  . MCHC 11/23/2017 33.1  32.0 - 36.0 g/dL Final  . RDW 11/23/2017 16.7* 11.5 - 14.5 % Final  . Platelets 11/23/2017 170  150 - 440 K/uL Final  . Neutrophils Relative % 11/23/2017 68  % Final  . Neutro Abs 11/23/2017 5.2  1.4 - 6.5 K/uL Final  . Lymphocytes Relative 11/23/2017 13  % Final  . Lymphs Abs 11/23/2017 1.0  1.0 - 3.6 K/uL Final  . Monocytes Relative 11/23/2017 14  % Final  . Monocytes Absolute 11/23/2017 1.1* 0.2 - 1.0 K/uL Final  . Eosinophils Relative 11/23/2017 3  % Final  . Eosinophils Absolute 11/23/2017 0.2  0 - 0.7 K/uL Final  . Basophils Relative 11/23/2017 2  % Final  . Basophils Absolute 11/23/2017 0.1  0 - 0.1 K/uL Final    Assessment:  Mylik Pro is a 66 y.o. male with stage IV adenocarcinoma of the lung and associated leukocytosis with eosinophilia. He presented with a 30 pound weight loss, shortness of breath, cough, and left leg discomfort. He has a 13-15 pack year smoking history.  Bronchoscopy on 03/30/2016 revealed an endobronchial lesion in the RUL anterior segment with 95% occlusion. There was extrinsic compression in the right middle lobe secondary to posterior mass effect. Pathology confirmed non-small cell lung cancer, favor adenocarcinoma.  PD-L1 testing revealed high expression (> 50%).  EGFR, ALK, and ROS1 were negative.  CEA was 2.3 on 03/27/2016.  PET scan on 04/05/2016  revealed intense hypermetabolism (SUV 17.95) associated with a 7.5 cm central perihilar right lung lesion. There was mediastinal (sub-carinal node SUV 8.89; 2.6 cm right paratracheal node SUV 17.49) and upper abdominal retroperitoneal hypermetabolic adenopathy (9 mm aortocaval node SUV 9.2). There were 2 liver metastasis (2.3 cm left lobe SUV 6.19; 2.2 cm segment VIII SUV 5.18). There are multifocal bone metastasis (C2 vertebral body SUV 9.0; 1.6 cm left sacral ala lesion SUV of 8.6).   He had marked leukocytosis with hypereosinophilia. Initial WBC was 72,300 with 53% eosinophils. Bone marrow on 03/29/2016 revealed marked increase in marrow eosinophils (approximate 30%). There was no diagnostic morphologic evidence of a myeloproliferative or lymphoid neoplasm. Flow cytometry revealed significant increase in eosinophils (54%). There was relative decreased myeloid cells with no significant immunophenotypic abnormalities or increase in blasts. There was no lymphoid abnormalities or evidence of clonality. FISH studies were negative for myeloproliferative neoplasms with eosinophilia (PDGFRA, PDGFRB, FGFR1). BCR-ABL was negative on 03/28/2016.  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  Left lower extremity duplex on 03/25/2016 revealed no evidence of DVT. Discomfort improved on Neurontin 300 mg a day and continues to improve with declining eosinophilia.  He received 41.4 Gy from 04/11/2016 - 05/23/2016.  He received 4 weeks of concurrent carboplatin and Taxol (04/17/2016 - 05/22/2016).    He has received 15 cycles of Keytruda (07/03/2016 - 10/12/2016; 11/22/2016 - 06/05/2017).  He has tolerated treatment well.  He receives [place] every 6 weeks (began 05/22/2016; last 06/26/2017).  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  He was on Xarelto from 04/13/2017 - 07/24/2017. Lovenox injections were not started on 07/13/2017 as  ordered; ordered again to start 07/25/2017.  He has increased liver function tests.  Etiology is possibly secondary to acute hepatitis, irritation due to thrombosis, or progressive disease (metastatic liver lesion or [place]).  He is hepatitis B core antibody and hepatitis C antibody positive.  Hepatitis C RNA was 5.474 log10 IU/ml (298,000 IU/ml) on 07/03/2017.  Hepatitis C genotype is 1a.  AFP was 589.7 on 07/03/2017.  Chest, abdomen, and pelvic CT on 07/02/2017 revealed interval increase in the left hepatic vein thrombosis with progressive heterogeneity and enhancement of the left hepatic lobe, predominantly obscuring the known left hepatic lobe lesion. There was a possible new 1.5 cm lateral left hepatic lobe lesion versus focal area of heterogeneity. There was slight interval decrease in the size of the irregular low-attenuation lesion within segment 8 of the liver. Right lung was stable in appearance with interval development of consolidation within the lingula representing atelectasis or infection.  Abdominal MRI on 07/05/2017 revealed persistent mass within lateral segment of left lobe of liver with progressive thrombosis of the left portal vein. There was also persistent thrombosis of the left hepatic vein. Portal vein thrombus extended up to the level of the main portal vein and was likely tumor thrombus. The liver has morphologic features suggestive of early cirrhosis. All findings are highly suggestive that the lesion in the left lobe of liver represents hepatoma rather than lung cancer metastasis. The lesion within segment 8 of the liver had decreased in size when compared with 04/13/2017.  The small lesion within segment 7 of the liver appeared new from previous exam. Indeterminate. This may represent an area of lung cancer metastasis or possibly multifocal HCC.  Ultrasound guided liver biopsy on 07/18/2017 revealed carcinoma c/w hepatocellular carcinoma.  He underwent arteriography,  embolization and shunt calculations on 07/24/2017 in preparation for Y-90 radioembolization of his liver.  He underwent left accessory hepatic arteriography with chemotherapy embolization of accessory hepatic artery with doxorubicin-eluting beads on 08/20/2017. Tumor was targeted in the lateral segment of the left lobe of the liver.  AFP has been followed: 589.7 on 07/03/2017, 819.2 on 08/20/2017, 475.6 on 09/07/2017, 1188 on 10/29/2017, and 1457 on 11/23/2017.  PET scan on 09/17/2017 revealed the original 2 lesions shown on the prior exam are less cohesivly visible, there were new spreading confluent lesions in segments 1, 2, 3, 4, 5, and 8 of the liver compatible with progressive multifocal hepatic malignancy. 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  There was post therapy related findings in the lungs without active pulmonary or osseous metastatic disease identified.  He began lenvatinib on 10/16/2017.  He takes 3 days in a row then waits a few days and continues.  Code status is DNR/DNI.  Symptomatically, he is fatigued.  He has lost 2 pounds since his last visit. WBC is 7600 with an [place] of 5200. Hemoglobin 13.7, hematocrit 41.3,  platelets 170,000. Total bilirubin is elevated at 3.8   Plan: 1.  Labs today:  CBC with diff, CMP, Mg, AFP. 2.  Hold Lenvatinib due to elevation of total bilirubin to 3.8.  Etiology unclear (toxicity of medication or progression of liver disease). 3.  Discuss plan for reimaging (chest, abdomen, pelvic CT) on 11/26/2017. 4.  Discuss pain control. Patient is taking the prescribed oxycodone 69m q4h PRN. He notes that this intervention is adequate in managing his pain. Refill Rx for oxycodone IR 5-10 mg q4 PRN (Disp #40)  5.  Discuss weight loss. Patient encouraged to increase calorie and protein intake as  much as possible. Patient to use supplement shakes BID to TID.  6.  RTC after CT scan for MD assessment and labs (CBC  with diff, CMP, Mg), and review of scans.    Honor Loh, NP  11/23/2017, 3:56 PM   I saw and evaluated the patient, participating in the key portions of the service and reviewing pertinent diagnostic studies and records.  I reviewed the nurse practitioner's note and agree with the findings and the plan.  The assessment and plan were discussed with the patient.  Imaging is needed to address his elevated bilirubin and concern for progressive disease.  Several questions were asked by the patient and answered.   Lequita Asal, MD 11/23/2017, 3:56 PM

## 2017-11-24 LAB — AFP TUMOR MARKER: AFP, Serum, Tumor Marker: 1457 ng/mL — ABNORMAL HIGH (ref 0.0–8.3)

## 2017-11-25 ENCOUNTER — Encounter: Payer: Self-pay | Admitting: Hematology and Oncology

## 2017-11-26 ENCOUNTER — Telehealth: Payer: Self-pay | Admitting: Oncology

## 2017-11-26 ENCOUNTER — Encounter: Payer: Self-pay | Admitting: Emergency Medicine

## 2017-11-26 ENCOUNTER — Ambulatory Visit
Admission: RE | Admit: 2017-11-26 | Discharge: 2017-11-26 | Disposition: A | Discharge status: Home / Self Care | Payer: Medicare Other | Source: Ambulatory Visit | Attending: Urgent Care | Admitting: Urgent Care

## 2017-11-26 ENCOUNTER — Encounter: Payer: Self-pay | Admitting: Hematology and Oncology

## 2017-11-26 ENCOUNTER — Other Ambulatory Visit: Payer: Self-pay

## 2017-11-26 ENCOUNTER — Telehealth: Payer: Self-pay | Admitting: Urgent Care

## 2017-11-26 ENCOUNTER — Telehealth: Payer: Self-pay | Admitting: *Deleted

## 2017-11-26 DIAGNOSIS — I81 Portal vein thrombosis: Secondary | ICD-10-CM | POA: Diagnosis present

## 2017-11-26 DIAGNOSIS — I82220 Acute embolism and thrombosis of inferior vena cava: Secondary | ICD-10-CM | POA: Diagnosis not present

## 2017-11-26 DIAGNOSIS — R748 Abnormal levels of other serum enzymes: Secondary | ICD-10-CM | POA: Insufficient documentation

## 2017-11-26 DIAGNOSIS — C787 Secondary malignant neoplasm of liver and intrahepatic bile duct: Secondary | ICD-10-CM | POA: Insufficient documentation

## 2017-11-26 DIAGNOSIS — M069 Rheumatoid arthritis, unspecified: Secondary | ICD-10-CM | POA: Diagnosis present

## 2017-11-26 DIAGNOSIS — R918 Other nonspecific abnormal finding of lung field: Secondary | ICD-10-CM

## 2017-11-26 DIAGNOSIS — I8290 Acute embolism and thrombosis of unspecified vein: Secondary | ICD-10-CM | POA: Diagnosis not present

## 2017-11-26 DIAGNOSIS — A09 Infectious gastroenteritis and colitis, unspecified: Secondary | ICD-10-CM | POA: Insufficient documentation

## 2017-11-26 DIAGNOSIS — K766 Portal hypertension: Secondary | ICD-10-CM | POA: Diagnosis present

## 2017-11-26 DIAGNOSIS — I2699 Other pulmonary embolism without acute cor pulmonale: Principal | ICD-10-CM | POA: Diagnosis present

## 2017-11-26 DIAGNOSIS — Z681 Body mass index (BMI) 19 or less, adult: Secondary | ICD-10-CM

## 2017-11-26 DIAGNOSIS — E43 Unspecified severe protein-calorie malnutrition: Secondary | ICD-10-CM | POA: Diagnosis present

## 2017-11-26 DIAGNOSIS — C220 Liver cell carcinoma: Secondary | ICD-10-CM | POA: Insufficient documentation

## 2017-11-26 DIAGNOSIS — G629 Polyneuropathy, unspecified: Secondary | ICD-10-CM | POA: Diagnosis present

## 2017-11-26 DIAGNOSIS — R188 Other ascites: Secondary | ICD-10-CM | POA: Insufficient documentation

## 2017-11-26 DIAGNOSIS — Z23 Encounter for immunization: Secondary | ICD-10-CM

## 2017-11-26 DIAGNOSIS — C3411 Malignant neoplasm of upper lobe, right bronchus or lung: Secondary | ICD-10-CM | POA: Diagnosis present

## 2017-11-26 DIAGNOSIS — Z808 Family history of malignant neoplasm of other organs or systems: Secondary | ICD-10-CM

## 2017-11-26 DIAGNOSIS — Y842 Radiological procedure and radiotherapy as the cause of abnormal reaction of the patient, or of later complication, without mention of misadventure at the time of the procedure: Secondary | ICD-10-CM

## 2017-11-26 DIAGNOSIS — Z882 Allergy status to sulfonamides status: Secondary | ICD-10-CM

## 2017-11-26 DIAGNOSIS — Z87891 Personal history of nicotine dependence: Secondary | ICD-10-CM

## 2017-11-26 DIAGNOSIS — Z923 Personal history of irradiation: Secondary | ICD-10-CM

## 2017-11-26 DIAGNOSIS — R0602 Shortness of breath: Secondary | ICD-10-CM | POA: Diagnosis not present

## 2017-11-26 DIAGNOSIS — E871 Hypo-osmolality and hyponatremia: Secondary | ICD-10-CM | POA: Diagnosis present

## 2017-11-26 DIAGNOSIS — C3490 Malignant neoplasm of unspecified part of unspecified bronchus or lung: Secondary | ICD-10-CM | POA: Diagnosis not present

## 2017-11-26 DIAGNOSIS — Z801 Family history of malignant neoplasm of trachea, bronchus and lung: Secondary | ICD-10-CM

## 2017-11-26 LAB — CBC WITH DIFFERENTIAL/PLATELET
Basophils Absolute: 0.1 10*3/uL (ref 0–0.1)
Basophils Relative: 1 %
EOS ABS: 0.1 10*3/uL (ref 0–0.7)
Eosinophils Relative: 2 %
HCT: 43 % (ref 40.0–52.0)
HEMOGLOBIN: 14.1 g/dL (ref 13.0–18.0)
LYMPHS ABS: 0.7 10*3/uL — AB (ref 1.0–3.6)
LYMPHS PCT: 11 %
MCH: 32.3 pg (ref 26.0–34.0)
MCHC: 32.7 g/dL (ref 32.0–36.0)
MCV: 98.6 fL (ref 80.0–100.0)
Monocytes Absolute: 0.8 10*3/uL (ref 0.2–1.0)
Monocytes Relative: 12 %
NEUTROS PCT: 74 %
Neutro Abs: 4.9 10*3/uL (ref 1.4–6.5)
Platelets: 163 10*3/uL (ref 150–440)
RBC: 4.36 MIL/uL — AB (ref 4.40–5.90)
RDW: 16.5 % — ABNORMAL HIGH (ref 11.5–14.5)
WBC: 6.7 10*3/uL (ref 3.8–10.6)

## 2017-11-26 LAB — COMPREHENSIVE METABOLIC PANEL
ALK PHOS: 230 U/L — AB (ref 38–126)
ALT: 63 U/L (ref 17–63)
AST: 128 U/L — AB (ref 15–41)
Albumin: 2.8 g/dL — ABNORMAL LOW (ref 3.5–5.0)
Anion gap: 8 (ref 5–15)
BUN: 12 mg/dL (ref 6–20)
CALCIUM: 8.3 mg/dL — AB (ref 8.9–10.3)
CO2: 25 mmol/L (ref 22–32)
CREATININE: 0.66 mg/dL (ref 0.61–1.24)
Chloride: 101 mmol/L (ref 101–111)
GFR calc non Af Amer: >60 mL/min (ref >60)
Glucose, Bld: 132 mg/dL — ABNORMAL HIGH (ref 65–99)
Potassium: 4.5 mmol/L (ref 3.5–5.1)
SODIUM: 134 mmol/L — AB (ref 135–145)
Total Bilirubin: 3.5 mg/dL — ABNORMAL HIGH (ref 0.3–1.2)
Total Protein: 8.5 g/dL — ABNORMAL HIGH (ref 6.5–8.1)

## 2017-11-26 LAB — TROPONIN I: Troponin I: <0.03 ng/mL (ref <0.03)

## 2017-11-26 MED ORDER — IOPAMIDOL (ISOVUE-300) INJECTION 61%
100.0000 mL | Freq: Once | INTRAVENOUS | Status: AC | PRN
  Administered 2017-11-26: 100 mL via INTRAVENOUS

## 2017-11-26 NOTE — ED Triage Notes (Signed)
Pt presents to ED after he spoke with his oncologist post CT scan (which was earlier today) and was told to come to ED for blood clots in his right lung. Pt currently undergoing tx for lung and liver CA. Pt reports having chronic sob and has noticed very little change in his respiratory status. Pt alert with slight increased work of breathing noted. No confirmed hx of blood clots but was told he possibly had a clot in his liver.

## 2017-11-26 NOTE — Telephone Encounter (Signed)
Call placed to ED charge nurse to make them aware that patient is coming in via POV following interval imaging today that demonstrates:  1. Pulmonary embolus in the patient's LEFT lung.   2. Progression of his hepatic cell carcinoma. Patient has new lesions that are likely causing hepatic obstruction that has precipitated an increase in his total bilirubin; last T-bili was 3.8 (previously 1.3 2 weeks prior). Patient has new small volume ascites.   3. Progression of a known portal vein thrombus that continues into the intra-cardiac IVC.   4. RIGHT colonic wall thickening representing a sequela associated with patient portal hypertension and portal thrombus, or a concurrent infectious colitis.   These findings were communicated to charge nurse Opal Sidles, RN). She was advised that the CT report represents significant changes and new developments. Patient was supposed to be on anticoagulation therapy using Enoxaparin   60 mg BID, however I was made aware of the fact that patient is not taking. Patient's daughter is to be bringing the patient in for further evaluation. I have prepared them for probable admission.

## 2017-11-26 NOTE — Telephone Encounter (Signed)
-----   Message from Lequita Asal, MD sent at 11/25/2017 12:29 PM EST ----- Regarding: Please find out about Lovenox  Patient to have scan on Monday.  Notes indicate he is not taking Lovenox.  M

## 2017-11-26 NOTE — Telephone Encounter (Signed)
Called patient and LVM for him to call me back to verify whether or not he is still taking Lovenox injections.

## 2017-11-26 NOTE — Telephone Encounter (Signed)
Called patient's daughter, Lyndel Safe, to have her get in touch with her dad regarding the results of his scan today.  Scan results show multiple PE's.  Tried to call patient earlier today to see if he is on Lovenox but unable to reach him.  Also called his son without success.  After scan results, was notified by Honor Loh, NP to contact patient to have him go to the ED with plans to stay.  Spoke with [name] @ 17:15 who states she will get in touch with her father on his mobile phone (we do not have number), and will take him to the ED.

## 2017-11-26 NOTE — Telephone Encounter (Signed)
Paged by Congress about patient's CT scan today which not only showed disease progression,but also new PE and progression of hepatic vein IVC thrombosis. Unclear if patient is on Lovenox.  I talked to Dr.Corcoran's NP Gaspar Bidding, who will try to contact patient and recommend patient to go ER for treatment. Marland Kitchen

## 2017-11-27 ENCOUNTER — Inpatient Hospital Stay
Admission: EM | Admit: 2017-11-27 | Discharge: 2017-11-28 | DRG: 175 | Disposition: A | Discharge status: Home Health | Payer: Medicare Other | Attending: Internal Medicine | Admitting: Internal Medicine

## 2017-11-27 ENCOUNTER — Other Ambulatory Visit: Payer: Self-pay

## 2017-11-27 DIAGNOSIS — I81 Portal vein thrombosis: Secondary | ICD-10-CM | POA: Diagnosis present

## 2017-11-27 DIAGNOSIS — K766 Portal hypertension: Secondary | ICD-10-CM | POA: Diagnosis present

## 2017-11-27 DIAGNOSIS — Z801 Family history of malignant neoplasm of trachea, bronchus and lung: Secondary | ICD-10-CM | POA: Diagnosis not present

## 2017-11-27 DIAGNOSIS — R5383 Other fatigue: Secondary | ICD-10-CM | POA: Diagnosis not present

## 2017-11-27 DIAGNOSIS — C3490 Malignant neoplasm of unspecified part of unspecified bronchus or lung: Secondary | ICD-10-CM | POA: Diagnosis not present

## 2017-11-27 DIAGNOSIS — Z681 Body mass index (BMI) 19 or less, adult: Secondary | ICD-10-CM | POA: Diagnosis not present

## 2017-11-27 DIAGNOSIS — Z923 Personal history of irradiation: Secondary | ICD-10-CM | POA: Diagnosis not present

## 2017-11-27 DIAGNOSIS — Z882 Allergy status to sulfonamides status: Secondary | ICD-10-CM | POA: Diagnosis not present

## 2017-11-27 DIAGNOSIS — I2699 Other pulmonary embolism without acute cor pulmonale: Secondary | ICD-10-CM | POA: Diagnosis present

## 2017-11-27 DIAGNOSIS — K529 Noninfective gastroenteritis and colitis, unspecified: Secondary | ICD-10-CM | POA: Diagnosis not present

## 2017-11-27 DIAGNOSIS — C220 Liver cell carcinoma: Secondary | ICD-10-CM | POA: Diagnosis not present

## 2017-11-27 DIAGNOSIS — M069 Rheumatoid arthritis, unspecified: Secondary | ICD-10-CM | POA: Diagnosis present

## 2017-11-27 DIAGNOSIS — C7951 Secondary malignant neoplasm of bone: Secondary | ICD-10-CM | POA: Diagnosis not present

## 2017-11-27 DIAGNOSIS — Z87891 Personal history of nicotine dependence: Secondary | ICD-10-CM | POA: Diagnosis not present

## 2017-11-27 DIAGNOSIS — R531 Weakness: Secondary | ICD-10-CM | POA: Diagnosis not present

## 2017-11-27 DIAGNOSIS — E43 Unspecified severe protein-calorie malnutrition: Secondary | ICD-10-CM | POA: Diagnosis present

## 2017-11-27 DIAGNOSIS — I8290 Acute embolism and thrombosis of unspecified vein: Secondary | ICD-10-CM

## 2017-11-27 DIAGNOSIS — Z23 Encounter for immunization: Secondary | ICD-10-CM | POA: Diagnosis not present

## 2017-11-27 DIAGNOSIS — C799 Secondary malignant neoplasm of unspecified site: Secondary | ICD-10-CM | POA: Diagnosis not present

## 2017-11-27 DIAGNOSIS — G629 Polyneuropathy, unspecified: Secondary | ICD-10-CM | POA: Diagnosis not present

## 2017-11-27 DIAGNOSIS — C762 Malignant neoplasm of abdomen: Secondary | ICD-10-CM | POA: Diagnosis not present

## 2017-11-27 DIAGNOSIS — I820 Budd-Chiari syndrome: Secondary | ICD-10-CM | POA: Diagnosis not present

## 2017-11-27 DIAGNOSIS — R0602 Shortness of breath: Secondary | ICD-10-CM | POA: Diagnosis not present

## 2017-11-27 DIAGNOSIS — C3411 Malignant neoplasm of upper lobe, right bronchus or lung: Secondary | ICD-10-CM | POA: Diagnosis not present

## 2017-11-27 DIAGNOSIS — G893 Neoplasm related pain (acute) (chronic): Secondary | ICD-10-CM | POA: Diagnosis not present

## 2017-11-27 DIAGNOSIS — R948 Abnormal results of function studies of other organs and systems: Secondary | ICD-10-CM | POA: Diagnosis not present

## 2017-11-27 DIAGNOSIS — E871 Hypo-osmolality and hyponatremia: Secondary | ICD-10-CM | POA: Diagnosis present

## 2017-11-27 DIAGNOSIS — C787 Secondary malignant neoplasm of liver and intrahepatic bile duct: Secondary | ICD-10-CM | POA: Diagnosis present

## 2017-11-27 DIAGNOSIS — Z808 Family history of malignant neoplasm of other organs or systems: Secondary | ICD-10-CM | POA: Diagnosis not present

## 2017-11-27 DIAGNOSIS — I82220 Acute embolism and thrombosis of inferior vena cava: Secondary | ICD-10-CM | POA: Diagnosis present

## 2017-11-27 LAB — HEPARIN LEVEL (UNFRACTIONATED)
Heparin Unfractionated: 0.58 IU/mL (ref 0.30–0.70)
Heparin Unfractionated: 0.31 IU/mL (ref 0.30–0.70)
Heparin Unfractionated: 0.32 IU/mL (ref 0.30–0.70)

## 2017-11-27 LAB — APTT: aPTT: >160 seconds (ref 24–36)

## 2017-11-27 LAB — PROTIME-INR
INR: 1.25
PROTHROMBIN TIME: 15.6 s — AB (ref 11.4–15.2)

## 2017-11-27 LAB — COMPREHENSIVE METABOLIC PANEL
ALBUMIN: 2.3 g/dL — AB (ref 3.5–5.0)
ALT: 50 U/L (ref 17–63)
AST: 105 U/L — AB (ref 15–41)
Alkaline Phosphatase: 188 U/L — ABNORMAL HIGH (ref 38–126)
Anion gap: 4 — ABNORMAL LOW (ref 5–15)
BILIRUBIN TOTAL: 4.1 mg/dL — AB (ref 0.3–1.2)
BUN: 11 mg/dL (ref 6–20)
CHLORIDE: 103 mmol/L (ref 101–111)
CO2: 25 mmol/L (ref 22–32)
Calcium: 7.8 mg/dL — ABNORMAL LOW (ref 8.9–10.3)
Creatinine, Ser: 0.55 mg/dL — ABNORMAL LOW (ref 0.61–1.24)
GFR calc Af Amer: >60 mL/min (ref >60)
GLUCOSE: 121 mg/dL — AB (ref 65–99)
POTASSIUM: 3.9 mmol/L (ref 3.5–5.1)
Sodium: 132 mmol/L — ABNORMAL LOW (ref 135–145)
TOTAL PROTEIN: 7.2 g/dL (ref 6.5–8.1)

## 2017-11-27 LAB — MAGNESIUM: Magnesium: 1.5 mg/dL — ABNORMAL LOW (ref 1.7–2.4)

## 2017-11-27 LAB — TSH: TSH: 4.224 u[IU]/mL (ref 0.350–4.500)

## 2017-11-27 MED ORDER — PNEUMOCOCCAL VAC POLYVALENT 25 MCG/0.5ML IJ INJ
0.5000 mL | INJECTION | INTRAMUSCULAR | Status: AC
  Administered 2017-11-28: 0.5 mL via INTRAMUSCULAR
  Filled 2017-11-27: qty 0.5

## 2017-11-27 MED ORDER — HEPARIN (PORCINE) IN NACL 100-0.45 UNIT/ML-% IJ SOLN
1200.0000 [IU]/h | INTRAMUSCULAR | Status: DC
  Administered 2017-11-27: 1050 [IU]/h via INTRAVENOUS
  Administered 2017-11-27: 1100 [IU]/h via INTRAVENOUS
  Filled 2017-11-27 (×2): qty 250

## 2017-11-27 MED ORDER — ONDANSETRON HCL 4 MG/2ML IJ SOLN: 4.0000 mg | Freq: Four times a day (QID) | INTRAMUSCULAR | Status: DC | PRN

## 2017-11-27 MED ORDER — CIPROFLOXACIN HCL 500 MG PO TABS
500.0000 mg | ORAL_TABLET | Freq: Two times a day (BID) | ORAL | Status: DC
  Administered 2017-11-27 – 2017-11-28 (×2): 500 mg via ORAL
  Filled 2017-11-27 (×2): qty 1

## 2017-11-27 MED ORDER — ONDANSETRON HCL 4 MG PO TABS: 4.0000 mg | ORAL_TABLET | Freq: Four times a day (QID) | ORAL | Status: DC | PRN

## 2017-11-27 MED ORDER — MORPHINE SULFATE (PF) 4 MG/ML IV SOLN
INTRAVENOUS | Status: AC
  Filled 2017-11-27: qty 1

## 2017-11-27 MED ORDER — LENVATINIB (8 MG DAILY DOSE) 2 X 4 MG PO CPPK: 8.0000 mg | ORAL_CAPSULE | Freq: Every day | ORAL | Status: DC

## 2017-11-27 MED ORDER — CALCIUM CARBONATE-VITAMIN D 500-200 MG-UNIT PO TABS
1.0000 | ORAL_TABLET | Freq: Two times a day (BID) | ORAL | Status: DC
  Administered 2017-11-27 – 2017-11-28 (×3): 1 via ORAL
  Filled 2017-11-27 (×3): qty 1

## 2017-11-27 MED ORDER — SODIUM CHLORIDE 0.9 % IV SOLN
INTRAVENOUS | Status: DC
  Administered 2017-11-27 – 2017-11-28 (×3): via INTRAVENOUS

## 2017-11-27 MED ORDER — ACETAMINOPHEN 650 MG RE SUPP: 650.0000 mg | Freq: Four times a day (QID) | RECTAL | Status: DC | PRN

## 2017-11-27 MED ORDER — AMOXICILLIN-POT CLAVULANATE 875-125 MG PO TABS: 1.0000 | ORAL_TABLET | Freq: Two times a day (BID) | ORAL | Status: DC

## 2017-11-27 MED ORDER — OXYCODONE HCL 5 MG PO TABS
10.0000 mg | ORAL_TABLET | Freq: Four times a day (QID) | ORAL | Status: DC | PRN
  Administered 2017-11-28: 06:00:00 10 mg via ORAL
  Filled 2017-11-27: qty 2

## 2017-11-27 MED ORDER — HEPARIN (PORCINE) IN NACL 100-0.45 UNIT/ML-% IJ SOLN: 14.0000 [IU]/kg/h | Freq: Once | INTRAMUSCULAR | Status: DC

## 2017-11-27 MED ORDER — ACETAMINOPHEN 325 MG PO TABS: 650.0000 mg | ORAL_TABLET | Freq: Four times a day (QID) | ORAL | Status: DC | PRN

## 2017-11-27 MED ORDER — AMOXICILLIN-POT CLAVULANATE 875-125 MG PO TABS
1.0000 | ORAL_TABLET | Freq: Once | ORAL | Status: AC
  Administered 2017-11-27: 1 via ORAL
  Filled 2017-11-27: qty 1

## 2017-11-27 MED ORDER — METRONIDAZOLE 500 MG PO TABS
500.0000 mg | ORAL_TABLET | Freq: Three times a day (TID) | ORAL | Status: DC
  Administered 2017-11-27 – 2017-11-28 (×4): 500 mg via ORAL
  Filled 2017-11-27 (×4): qty 1

## 2017-11-27 MED ORDER — INFLUENZA VAC SPLIT HIGH-DOSE 0.5 ML IM SUSY
0.5000 mL | PREFILLED_SYRINGE | INTRAMUSCULAR | Status: AC
  Administered 2017-11-28: 0.5 mL via INTRAMUSCULAR
  Filled 2017-11-27: qty 0.5

## 2017-11-27 MED ORDER — DOCUSATE SODIUM 100 MG PO CAPS
100.0000 mg | ORAL_CAPSULE | Freq: Two times a day (BID) | ORAL | Status: DC
  Administered 2017-11-28: 09:00:00 100 mg via ORAL
  Filled 2017-11-27: qty 1

## 2017-11-27 MED ORDER — BOOST / RESOURCE BREEZE PO LIQD CUSTOM: 1.0000 | Freq: Two times a day (BID) | ORAL | Status: DC

## 2017-11-27 MED ORDER — PREMIER PROTEIN SHAKE
11.0000 [oz_av] | Freq: Two times a day (BID) | ORAL | Status: DC
  Administered 2017-11-27: 21:00:00 11 [oz_av] via ORAL

## 2017-11-27 MED ORDER — HEPARIN SODIUM (PORCINE) 5000 UNIT/ML IJ SOLN: 60.0000 [IU]/kg | Freq: Once | INTRAMUSCULAR | Status: DC

## 2017-11-27 MED ORDER — HEPARIN BOLUS VIA INFUSION
4550.0000 [IU] | Freq: Once | INTRAVENOUS | Status: AC
  Administered 2017-11-27: 4550 [IU] via INTRAVENOUS
  Filled 2017-11-27: qty 4550

## 2017-11-27 MED ORDER — MORPHINE SULFATE (PF) 4 MG/ML IV SOLN
4.0000 mg | INTRAVENOUS | Status: AC
  Administered 2017-11-27: 4 mg via INTRAVENOUS

## 2017-11-27 MED ORDER — ADULT MULTIVITAMIN W/MINERALS CH
1.0000 | ORAL_TABLET | Freq: Every day | ORAL | Status: DC
  Administered 2017-11-28: 09:00:00 1 via ORAL
  Filled 2017-11-27: qty 1

## 2017-11-27 NOTE — ED Notes (Signed)
Pt given food and drink per request.

## 2017-11-27 NOTE — Progress Notes (Signed)
Initial Nutrition Assessment  DOCUMENTATION CODES:   Severe malnutrition in context of chronic illness, Underweight  INTERVENTION:  Provide Premier Protein po BID, each supplement provides 160 kcal and 30 grams of protein.  Provide Boost Breeze po BID, each supplement provides 250 kcal and 9 grams of protein.  Provide MVI daily.  NUTRITION DIAGNOSIS:   Severe Malnutrition related to chronic illness(stage IV adenocarcinoma of lung, HCC) as evidenced by severe fat depletion, severe muscle depletion.  GOAL:   Patient will meet greater than or equal to 90% of their needs  MONITOR:   PO intake, Supplement acceptance, Labs, Weight trends, Skin, I & O's  REASON FOR ASSESSMENT:   Other (Comment)(Low BMI)    ASSESSMENT:   66 year old male with PMHx of arthritis, hx spontaneous pneumothorax, RA, hepatitis B, hepatitis C, stage IV adenocarcinoma of the lung and hepatocellular carcinoma on lenvatinib admitted with large pulmonary embolus.   Met with patient and his daughter at bedside. He reports his appetite is pretty good now. He reports when he was initially diagnosed with cancer he had a poor appetite but it improved. He tries to eat small, frequent meals. He has two good meals daily and then snacks between meals on chicken pot pies or burritos. He does not drink oral nutrition supplements regularly because he does not like Ensure or Boost. He would like to try the [employer] and Premier Protein drinks to see if he likes them.  UBW 165 lbs. No weight that high in chart. Patient reports that when he was initially diagnosed with cancer he lost from 165 lbs to 124 lbs. Since then he has gained a little weight back and he fluctuates between 126-132 lbs now.  Medications reviewed and include: Oscal with D 1 tablet BID, Colace, lenvatinib 8 mg daily, NS @ 125 mL/hr, heparin infusion.  Labs reviewed: Sodium 134, Glucose 132.  NUTRITION - FOCUSED PHYSICAL EXAM:    Most Recent Value   Orbital Region  Severe depletion  Upper Arm Region  Severe depletion  Thoracic and Lumbar Region  Severe depletion  Buccal Region  Severe depletion  Temple Region  Severe depletion  Clavicle Bone Region  Severe depletion  Clavicle and Acromion Bone Region  Severe depletion  Scapular Bone Region  Severe depletion  Dorsal Hand  Severe depletion  Patellar Region  Severe depletion  Anterior Thigh Region  Severe depletion  Posterior Calf Region  Severe depletion  Edema (RD Assessment)  None  Hair  Reviewed  Eyes  Reviewed  Mouth  Reviewed  Skin  Reviewed  Nails  Reviewed     Diet Order:  Diet regular Room service appropriate? Yes; Fluid consistency: Thin  EDUCATION NEEDS:   No education needs have been identified at this time  Skin:  Skin Assessment: Reviewed RN Assessment(ecchymosis to right hand)  Last BM:  11/27/2017  Height:   Ht Readings from Last 1 Encounters:  11/27/17 5' 11"  (1.803 m)    Weight:   Wt Readings from Last 1 Encounters:  11/27/17 129 lb 12.8 oz (58.9 kg)    Ideal Body Weight:  78.2 kg  BMI:  Body mass index is 18.1 kg/m.  Estimated Nutritional Needs:   Kcal:  1820-2100 (MSJ x 1.3-1.5)  Protein:  90-100 grams (1.5-1.7 grams/kg)  Fluid:  1.8-2 L/day (30-35 mL/kg)  Willey Blade, MS, RD, LDN Office: [phone] Pager: [phone] After Hours/Weekend Pager: [phone]

## 2017-11-27 NOTE — Progress Notes (Signed)
[facility] NAME: Gerald [name]    MR#:  [id number]  DATE OF BIRTH:  08-Sep-1951  SUBJECTIVE:  CHIEF COMPLAINT:  Patient is resting comfortably. Denies any chest pain or shortness of breath. Denies any diarrhea either, no abdominal pain  REVIEW OF SYSTEMS:  CONSTITUTIONAL: No fever, fatigue or weakness.  EYES: No blurred or double vision.  EARS, NOSE, AND THROAT: No tinnitus or ear pain.  RESPIRATORY: No cough, shortness of breath, wheezing or hemoptysis.  CARDIOVASCULAR: No chest pain, orthopnea, edema.  GASTROINTESTINAL: No nausea, vomiting, diarrhea or abdominal pain.  GENITOURINARY: No dysuria, hematuria.  ENDOCRINE: No polyuria, nocturia,  HEMATOLOGY: No anemia, easy bruising or bleeding SKIN: No rash or lesion. MUSCULOSKELETAL: No joint pain or arthritis.   NEUROLOGIC: No tingling, numbness, weakness.  PSYCHIATRY: No anxiety or depression.   DRUG ALLERGIES:   Allergies  Allergen Reactions  . Nsaids Other (See Comments)    Can take NSAIDs short term but cannot be on it long term d/t renal insuff  . Sulfa Antibiotics Other (See Comments)    Reaction:  GI upset     VITALS:  Blood pressure (!) 103/56, pulse (!) 109, temperature 98.5 F (36.9 C), temperature source Oral, resp. rate 18, height 5\' 11"  (1.803 m), weight 58.9 kg (129 lb 12.8 oz), SpO2 95 %.  PHYSICAL EXAMINATION:  GENERAL:  66 y.o.-year-old patient lying in the bed with no acute distress.  EYES: Pupils equal, round, reactive to light and accommodation. No scleral icterus. Extraocular muscles intact.  HEENT: Head atraumatic, normocephalic. Oropharynx and nasopharynx clear.  NECK:  Supple, no jugular venous distention. No thyroid enlargement, no tenderness.  LUNGS: Normal breath sounds bilaterally, no wheezing, rales,rhonchi or crepitation. No use of accessory muscles of respiration.  CARDIOVASCULAR: S1, S2 normal. No murmurs, rubs, or gallops.  ABDOMEN:  Soft, nontender, nondistended. Bowel sounds present. No organomegaly or mass.  EXTREMITIES: No pedal edema, cyanosis, or clubbing.  NEUROLOGIC: Cranial nerves II through XII are intact. Muscle strength 5/5 in all extremities. Sensation intact. Gait not checked.  PSYCHIATRIC: The patient is alert and oriented x 3.  SKIN: No obvious rash, lesion, or ulcer.    LABORATORY PANEL:   CBC Recent Labs  Lab 11/26/17 2104  WBC 6.7  HGB 14.1  HCT 43.0  PLT 163   ------------------------------------------------------------------------------------------------------------------  Chemistries  Recent Labs  Lab 11/23/17 1520 11/26/17 2104  NA 134* 134*  K 4.4 4.5  CL 101 101  CO2 25 25  GLUCOSE 111* 132*  BUN 19 12  CREATININE 0.72 0.66  CALCIUM 8.7* 8.3*  MG 1.6*  --   AST 121* 128*  ALT 60 63  ALKPHOS 201* 230*  BILITOT 3.8* 3.5*   ------------------------------------------------------------------------------------------------------------------  Cardiac Enzymes Recent Labs  Lab 11/26/17 2104  TROPONINI <0.03   ------------------------------------------------------------------------------------------------------------------  RADIOLOGY:  Ct Chest W Contrast  Result Date: 11/26/2017 CLINICAL DATA:  Hepatocellular carcinoma. Stage IV adenocarcinoma of the lung. EXAM: CT CHEST, ABDOMEN, AND PELVIS WITH CONTRAST TECHNIQUE: Multidetector CT imaging of the chest, abdomen and pelvis was performed following the standard protocol during bolus administration of intravenous contrast. CONTRAST:  146mL ISOVUE-300 IOPAMIDOL (ISOVUE-300) INJECTION 61% COMPARISON:  09/17/2017 PET. Chest abdomen and pelvic CTs of 07/02/2017. FINDINGS: CT CHEST FINDINGS Cardiovascular: Tortuous thoracic aorta. Aortic atherosclerosis. Normal heart size, without pericardial effusion. Multivessel coronary artery atherosclerosis. Moderate volume pulmonary embolism to the left lower lobe, identified within segmental and  subsegmental branches including on image 35/series  2. No findings of right heart strain. Mediastinum/Nodes: Small left supraclavicular nodes are similar. No mediastinal or hilar adenopathy. Lungs/Pleura: Small left and trace right pleural effusion. The left-sided effusion is increased and right-sided effusion is new since the prior diagnostic CT. Secretions in dependent trachea. Moderate centrilobular emphysema. Development of basilar predominant bilateral pulmonary nodules since the prior diagnostic CT. Example 6 mm right lower lobe pulmonary nodule on image 90/series 4. The left lower lobe pulmonary nodule measures 9 mm on image 121/series 4. Medial right upper lobe and right perihilar radiation induced consolidation with traction bronchiectasis. Similar in morphology and distribution back to the prior diagnostic CT. Musculoskeletal: No acute osseous abnormality. CT ABDOMEN PELVIS FINDINGS Hepatobiliary: New high right hepatic lobe low-density lesion at 7 mm on image 48/series 2. Index higha lateral segment left liver lobe lesion measures on the order of 2.0 cm on image 53/series 2 versus 1.5 cm on the prior diagnostic CT. New or progressive confluent lateral segment left liver lobe lesion measures on the order of 3.6 x 2.9 cm on image 58/series 2. This was likely present on 09/17/2017 PET. And new caudate lobe lesion since the prior diagnostic CT measures 2.9 x 2.7 cm on image 55/series 2. Grossly normal gallbladder, common duct dilatation. There is developing intrahepatic duct dilatation within the high left liver lobe, including on image 53/series 2. This is likely due to obstruction by central tumor. Pancreas: Normal, without mass or ductal dilatation. Spleen: Large splenule. Adrenals/Urinary Tract: Normal adrenal glands. Upper pole left renal scarring medially. Normal right kidney and urinary bladder. Stomach/Bowel: Varices about the gastroesophageal junction. Right-sided colonic wall thickening is moderate,  including on image 88/series 2. Normal small bowel caliber. Vascular/Lymphatic: Advanced aortic and branch vessel atherosclerosis. Since the prior diagnostic CT, progression of portal vein thrombus. This is grossly similar to on the prior PET. Presumed tumor thrombus within portal vein branches. This alters perfusion and makes evaluation of the extent of hepatic metastasis difficult. Thrombus also continues into the extrahepatic portal vein, including on image 66/series 2. No extension into the splenic vein or superior mesenteric veins. Tumor thrombus is also identified within the left hepatic vein, including on image 47/series 2. This is progressive since the prior diagnostic CT. This continues into the intra cardiac IVC, including on image 44/series 2. Gastrohepatic ligament node measures 8 mm on image 57/series 2 and is similar. Not pathologic by size criteria. No pelvic sidewall adenopathy. Reproductive: Normal prostate. Other: New small volume abdominopelvic ascites. Musculoskeletal: No acute osseous abnormality. IMPRESSION: CT CHEST IMPRESSION 1. Development of bilateral pulmonary nodules, consistent with metastatic disease. 2. Moderate volume left-sided pulmonary emboli. 3. Similar radiation change within the medial right lung. 4. Increased tiny left and new trace right pleural effusion. CT ABDOMEN AND PELVIS IMPRESSION 1. Progression of hepatic metastasis, especially when compared to the prior diagnostic CT of 07/02/2017. Direct comparison to the PET is difficult secondary to cross modality. 2. Progression of portal and hepatic vein thrombus with IVC thrombus continuing into the intracardiac IVC. 3. Right-sided colonic wall thickening is most likely due to portal venous hypertension and portal vein thrombus. Concurrent infectious colitis cannot be excluded. 4. New small volume abdominopelvic ascites. 5. Developing high left hepatic lobe intrahepatic biliary duct dilatation. Recommend attention on follow-up.  These results will be called to the ordering clinician or representative by the Radiologist Assistant, and communication documented in the PACS or zVision Dashboard. Electronically Signed   By: Abigail Miyamoto M.D.   On: 11/26/2017 16:36  Ct Abdomen Pelvis W Contrast  Result Date: 11/26/2017 CLINICAL DATA:  Hepatocellular carcinoma. Stage IV adenocarcinoma of the lung. EXAM: CT CHEST, ABDOMEN, AND PELVIS WITH CONTRAST TECHNIQUE: Multidetector CT imaging of the chest, abdomen and pelvis was performed following the standard protocol during bolus administration of intravenous contrast. CONTRAST:  15mL ISOVUE-300 IOPAMIDOL (ISOVUE-300) INJECTION 61% COMPARISON:  09/17/2017 PET. Chest abdomen and pelvic CTs of 07/02/2017. FINDINGS: CT CHEST FINDINGS Cardiovascular: Tortuous thoracic aorta. Aortic atherosclerosis. Normal heart size, without pericardial effusion. Multivessel coronary artery atherosclerosis. Moderate volume pulmonary embolism to the left lower lobe, identified within segmental and subsegmental branches including on image 35/series 2. No findings of right heart strain. Mediastinum/Nodes: Small left supraclavicular nodes are similar. No mediastinal or hilar adenopathy. Lungs/Pleura: Small left and trace right pleural effusion. The left-sided effusion is increased and right-sided effusion is new since the prior diagnostic CT. Secretions in dependent trachea. Moderate centrilobular emphysema. Development of basilar predominant bilateral pulmonary nodules since the prior diagnostic CT. Example 6 mm right lower lobe pulmonary nodule on image 90/series 4. The left lower lobe pulmonary nodule measures 9 mm on image 121/series 4. Medial right upper lobe and right perihilar radiation induced consolidation with traction bronchiectasis. Similar in morphology and distribution back to the prior diagnostic CT. Musculoskeletal: No acute osseous abnormality. CT ABDOMEN PELVIS FINDINGS Hepatobiliary: New high right  hepatic lobe low-density lesion at 7 mm on image 48/series 2. Index higha lateral segment left liver lobe lesion measures on the order of 2.0 cm on image 53/series 2 versus 1.5 cm on the prior diagnostic CT. New or progressive confluent lateral segment left liver lobe lesion measures on the order of 3.6 x 2.9 cm on image 58/series 2. This was likely present on 09/17/2017 PET. And new caudate lobe lesion since the prior diagnostic CT measures 2.9 x 2.7 cm on image 55/series 2. Grossly normal gallbladder, common duct dilatation. There is developing intrahepatic duct dilatation within the high left liver lobe, including on image 53/series 2. This is likely due to obstruction by central tumor. Pancreas: Normal, without mass or ductal dilatation. Spleen: Large splenule. Adrenals/Urinary Tract: Normal adrenal glands. Upper pole left renal scarring medially. Normal right kidney and urinary bladder. Stomach/Bowel: Varices about the gastroesophageal junction. Right-sided colonic wall thickening is moderate, including on image 88/series 2. Normal small bowel caliber. Vascular/Lymphatic: Advanced aortic and branch vessel atherosclerosis. Since the prior diagnostic CT, progression of portal vein thrombus. This is grossly similar to on the prior PET. Presumed tumor thrombus within portal vein branches. This alters perfusion and makes evaluation of the extent of hepatic metastasis difficult. Thrombus also continues into the extrahepatic portal vein, including on image 66/series 2. No extension into the splenic vein or superior mesenteric veins. Tumor thrombus is also identified within the left hepatic vein, including on image 47/series 2. This is progressive since the prior diagnostic CT. This continues into the intra cardiac IVC, including on image 44/series 2. Gastrohepatic ligament node measures 8 mm on image 57/series 2 and is similar. Not pathologic by size criteria. No pelvic sidewall adenopathy. Reproductive: Normal  prostate. Other: New small volume abdominopelvic ascites. Musculoskeletal: No acute osseous abnormality. IMPRESSION: CT CHEST IMPRESSION 1. Development of bilateral pulmonary nodules, consistent with metastatic disease. 2. Moderate volume left-sided pulmonary emboli. 3. Similar radiation change within the medial right lung. 4. Increased tiny left and new trace right pleural effusion. CT ABDOMEN AND PELVIS IMPRESSION 1. Progression of hepatic metastasis, especially when compared to the prior diagnostic CT of 07/02/2017. Direct  comparison to the PET is difficult secondary to cross modality. 2. Progression of portal and hepatic vein thrombus with IVC thrombus continuing into the intracardiac IVC. 3. Right-sided colonic wall thickening is most likely due to portal venous hypertension and portal vein thrombus. Concurrent infectious colitis cannot be excluded. 4. New small volume abdominopelvic ascites. 5. Developing high left hepatic lobe intrahepatic biliary duct dilatation. Recommend attention on follow-up. These results will be called to the ordering clinician or representative by the Radiologist Assistant, and communication documented in the PACS or zVision Dashboard. Electronically Signed   By: Abigail Miyamoto M.D.   On: 11/26/2017 16:36    EKG:   Orders placed or performed during the hospital encounter of 11/27/17  . ED EKG  . ED EKG    ASSESSMENT AND PLAN:   1.  PE: Extensive;  continue heparin drip.   I have consulted vascular surgery for possible  Thrombectomy, d/w dr.Dew Echo  2.  Metastatic cancer: CT of chest and abdomen shows progression of metastasis.  Is currently undergoing chemotherapy and has already completed radiation.   Chemotherapy per oncology.  3.  Colitis: Concern for infectious etiology.  on Augmentin in the emergency department.  We will start Cipro and Flagyl. Discontinue Augmentin  4.  Hyponatremia: Likely secondary to lung malignancy.  Hydrate with normal saline.  5.   DVT prophylaxis: Heparin drip   6.  GI prophylaxis: None       All the records are reviewed and case discussed with Care Management/Social Workerr. Management plans discussed with the patient, family and they are in agreement.  CODE STATUS: fc  TOTAL TIME TAKING CARE OF THIS PATIENT: 35 minutes.   POSSIBLE D/C IN 1-2 DAYS, DEPENDING ON CLINICAL CONDITION.  Note: This dictation was prepared with Dragon dictation along with smaller phrase technology. Any transcriptional errors that result from this process are unintentional.   Nicholes Mango M.D on 11/27/2017 at 4:14 PM  Between 7am to 6pm - Pager - [phone] After 6pm go to [email or web address] - password [email or web address] [facility]  Office  [phone]  CC: Primary care physician; Patient, No Pcp Per

## 2017-11-27 NOTE — Progress Notes (Signed)
ANTICOAGULATION CONSULT NOTE - Initial Consult  Pharmacy Consult for heparin Indication: pulmonary embolus  Allergies  Allergen Reactions  . Nsaids Other (See Comments)    Can take NSAIDs short term but cannot be on it long term d/t renal insuff  . Sulfa Antibiotics Other (See Comments)    Reaction:  GI upset     Patient Measurements: Height: 5\' 11"  (180.3 cm) Weight: 126 lb (57.2 kg) IBW/kg (Calculated) : 75.3 Heparin Dosing Weight: 57.2 kg  Vital Signs: Temp: 97.9 F (36.6 C) (12/17 2048) Temp Source: Oral (12/17 2048) BP: 131/88 (12/18 0100) Pulse Rate: 104 (12/18 0045)  Labs: Recent Labs    11/26/17 2104  HGB 14.1  HCT 43.0  PLT 163  CREATININE 0.66  TROPONINI <0.03    Estimated Creatinine Clearance: 74.5 mL/min (by C-G formula based on SCr of 0.66 mg/dL).   Medical History: Past Medical History:  Diagnosis Date  . Arthritis    Rheumatoid arthritis  . Cancer ([place])    lung ca   . Collagen vascular disease (HCC)    RA  . Dyspnea   . Hepatitis    b and c  . Mass of lung   . Pneumonia   . Pneumothorax    spontaneous  . Renal disorder    as a child    Medications:  Scheduled:  . amoxicillin-clavulanate  1 tablet Oral Once  . heparin  4,550 Units Intravenous Once    Assessment: Patient has active metastatic hepatic cancer, on CT shows disease progression and evidence of left-sided PE. Patient also was prescribed lovenox 60 mg subq as an outpatient for VTE treatment for possible intrahepatic clot, but on med rec states patient was not taking. Patient is now being started on heparin drip for newly developed left-sided PE on CT  Goal of Therapy:  Heparin level 0.3-0.7 units/ml Monitor platelets by anticoagulation protocol: Yes   Plan:  Will bolus heparin 4550 units IV x 1 Will start drip rate @ 1050 units/hr Baseline labs ordered Will monitor daily CBC's and adjust per anti-Xa's  Tobie Lords, PharmD, BCPS Clinical  Pharmacist 11/27/2017

## 2017-11-27 NOTE — Care Management Note (Signed)
Case Management Note  Patient Details  Name: Gerald [name] MRN: [id number] Date of Birth: [birth year]  Subjective/Objective:                  Admitted to [facility] with the diagnosis of Pulmonary Embolism and lung malignancy. Lives with son, Gerald [name] [phone]). Goes to the [facility] for physician services and sees Gerald [name].  Radiation in the past, prescriptions are filled at [facility]. No home Health. No skilled facility. No home oxygen. Takes care of all basic activities of daily living himself, drives. No falls. Good appetite (Weight 160-124), but eating better now.   Action/Plan: Received referral for medication assistance. Gerald [name] states that he pays out of pocket for a lot of his medications. Application for Medication Management given.   Expected Discharge Date:                  Expected Discharge Plan:     In-House Referral:   yes  Discharge planning Services     Post Acute Care Choice:    Choice offered to:     DME Arranged:    DME Agency:     HH Arranged:    HH Agency:     Status of Service:     If discussed at [employer], dates discussed:    Additional Comments:  Gerald Ammons, RN MSN CCM Care Management [phone] 11/27/2017, 2:49 PM

## 2017-11-27 NOTE — ED Notes (Signed)
Attempted report, was told RN is in an isolation room at this time and will call back to report on patient when she comes out

## 2017-11-27 NOTE — Progress Notes (Signed)
[facility] for heparin Indication: pulmonary embolus  Allergies  Allergen Reactions  . Nsaids Other (See Comments)    Can take NSAIDs short term but cannot be on it long term d/t renal insuff  . Sulfa Antibiotics Other (See Comments)    Reaction:  GI upset     Patient Measurements: Height: 5\' 11"  (180.3 cm) Weight: 129 lb 12.8 oz (58.9 kg) IBW/kg (Calculated) : 75.3 Heparin Dosing Weight: 57.2 kg  Vital Signs: Temp: 98.4 F (36.9 C) (12/18 2002) Temp Source: Oral (12/18 2002) BP: 119/71 (12/18 2002) Pulse Rate: 60 (12/18 2002)  Labs: Recent Labs    11/26/17 2104 11/27/17 0114 11/27/17 1110 11/27/17 1915  HGB 14.1  --   --   --   HCT 43.0  --   --   --   PLT 163  --   --   --   APTT  --  >160*  --   --   LABPROT  --  15.6*  --   --   INR  --  1.25  --   --   HEPARINUNFRC  --  0.58 0.31 0.32  CREATININE 0.66  --   --  0.55*  TROPONINI <0.03  --   --   --     Estimated Creatinine Clearance: 76.7 mL/min (A) (by C-G formula based on SCr of 0.55 mg/dL (L)).   Medical History: Past Medical History:  Diagnosis Date  . Arthritis    Rheumatoid arthritis  . Cancer ([place])    lung ca   . Collagen vascular disease (HCC)    RA  . Dyspnea   . Hepatitis    b and c  . Mass of lung   . Pneumonia   . Pneumothorax    spontaneous  . Renal disorder    as a child    Assessment: Patient has active metastatic hepatic cancer, on CT shows disease progression and evidence of left-sided PE. Patient also was prescribed lovenox 60 mg subq as an outpatient for VTE treatment for possible intrahepatic clot, but on med rec states patient was not taking. Patient is now being started on heparin drip for newly developed left-sided PE on CT  Goal of Therapy:  Heparin level 0.3-0.7 units/ml Monitor platelets by anticoagulation protocol: Yes   Plan:  HL = 0.32 is therapeutic. Will continue heparin infusion at current rate of 1100 units/hr and  order confirmatory HL in 6 hours. CBC ordered with AM labs tomorrow.  Lenis Noon, PharmD, BCPS Clinical Pharmacist 11/27/17 8:12 PM

## 2017-11-27 NOTE — Progress Notes (Signed)
[place] for heparin Indication: pulmonary embolus  Allergies  Allergen Reactions  . Nsaids Other (See Comments)    Can take NSAIDs short term but cannot be on it long term d/t renal insuff  . Sulfa Antibiotics Other (See Comments)    Reaction:  GI upset     Patient Measurements: Height: 5\' 11"  (180.3 cm) Weight: 129 lb 12.8 oz (58.9 kg) IBW/kg (Calculated) : 75.3 Heparin Dosing Weight: 57.2 kg  Vital Signs: Temp: 98.2 F (36.8 C) (12/18 0906) Temp Source: Oral (12/18 0906) BP: 112/65 (12/18 0906) Pulse Rate: 102 (12/18 0906)  Labs: Recent Labs    11/26/17 2104 11/27/17 0114 11/27/17 1110  HGB 14.1  --   --   HCT 43.0  --   --   PLT 163  --   --   APTT  --  >160*  --   LABPROT  --  15.6*  --   INR  --  1.25  --   HEPARINUNFRC  --  0.58 0.31  CREATININE 0.66  --   --   TROPONINI <0.03  --   --     Estimated Creatinine Clearance: 76.7 mL/min (by C-G formula based on SCr of 0.66 mg/dL).   Medical History: Past Medical History:  Diagnosis Date  . Arthritis    Rheumatoid arthritis  . Cancer ([place])    lung ca   . Collagen vascular disease (HCC)    RA  . Dyspnea   . Hepatitis    b and c  . Mass of lung   . Pneumonia   . Pneumothorax    spontaneous  . Renal disorder    as a child    Medications:  Scheduled:  . calcium-vitamin D  1 tablet Oral BID  . docusate sodium  100 mg Oral BID  . [START ON 11/28/2017] Influenza vac split quadrivalent PF  0.5 mL Intramuscular Tomorrow-1000  . lenvatinib 8 mg daily dose  8 mg Oral Daily  . [START ON 11/28/2017] pneumococcal 23 valent vaccine  0.5 mL Intramuscular Tomorrow-1000    Assessment: Patient has active metastatic hepatic cancer, on CT shows disease progression and evidence of left-sided PE. Patient also was prescribed lovenox 60 mg subq as an outpatient for VTE treatment for possible intrahepatic clot, but on med rec states patient was not taking. Patient is now  being started on heparin drip for newly developed left-sided PE on CT  Goal of Therapy:  Heparin level 0.3-0.7 units/ml Monitor platelets by anticoagulation protocol: Yes   Plan:  Will bolus heparin 4550 units IV x 1 Will start drip rate @ 1050 units/hr Baseline labs ordered Will monitor daily CBC's and adjust per anti-Xa's  12/18 HL @ 1110 = 0.31. Patient borderline therapeutic. Will slightly increase Heparin rate to 1100 units/hr. Will recheck in 6 hours at 1900.  Chinita Greenland PharmD Clinical Pharmacist 11/27/2017

## 2017-11-27 NOTE — ED Notes (Signed)
Family at bedside. 

## 2017-11-27 NOTE — ED Provider Notes (Signed)
[facility] Emergency Department Provider Note   ____________________________________________   First MD Initiated Contact with Patient 11/27/17 0034     (approximate)  I have reviewed the triage vital signs and the nursing notes.   HISTORY  Chief Complaint Shortness of Breath    HPI Gerald [name] is a 66 y.o. male who presents to the ED from home with a chief complaint of pulmonary emboli.  Patient has stage IV adenocarcinoma of the lung and hepatocellular carcinoma on lenvatinib who had a CT chest, abdomen/pelvis yesterday afternoon.  He was called by his oncologist with findings on CT of pulmonary emboli in his left lung, progression of hepatic cell carcinoma with new lesions, progression of known portal vein thrombus extending into the intracardiac IVC and right colonic wall thickening which is most likely a sequela associated with patient's portal hypertension and portal thrombus, or could be a concurrent infectious colitis.  Patient was told to come to the ED for hospitalization.  He was prescribed Lovenox approximately 6 weeks ago for portal vein thrombus.  States he has not taken it in 3-4 weeks because the shots are too painful.  Has been experiencing some right lower quadrant pain which he has been attributing to his liver.  Reports he is chronically short of breath, more so on exertion.  Denies recent fever, chills, chest pain, nausea, vomiting.     Past Medical History:  Diagnosis Date  . Arthritis    Rheumatoid arthritis  . Cancer ([place])    lung ca   . Collagen vascular disease (HCC)    RA  . Dyspnea   . Hepatitis    b and c  . Mass of lung   . Pneumonia   . Pneumothorax    spontaneous  . Renal disorder    as a child    Patient Active Problem List   Diagnosis Date Noted  . Hypomagnesemia 09/20/2017  . Hepatocellular carcinoma ([place]) 08/20/2017  . Increased liver enzymes 07/07/2017  . Elevated AFP 07/07/2017  . Portal vein  thrombosis 07/07/2017  . Hepatitis B infection without delta agent without hepatic coma 07/03/2017  . Hepatitis C virus infection without hepatic coma 07/03/2017  . Hepatic vein thrombosis ([place]) 04/14/2017  . Goals of care, counseling/discussion 03/27/2017  . Encounter for antineoplastic immunotherapy 01/02/2017  . Liver metastasis ([place]) 12/10/2016  . Dyspnea 07/11/2016  . Pneumonia 07/11/2016  . Lung cancer ([place]) 07/11/2016  . Right Hilar mass 07/11/2016  . Aspiration pneumonia ([place]) 06/08/2016  . Cancer associated pain 04/17/2016  . Cerebral infarction, unspecified ([place]) 04/09/2016  . Peripheral neuropathy 04/03/2016  . Bone metastasis ([place]) 03/31/2016  . Primary cancer of right upper lobe of lung ([place]) 03/30/2016  . Protein-calorie malnutrition, severe 03/28/2016  . Lymphoproliferative disease ([place])   . Cough   . Atrial mass   . Eosinophilia 03/27/2016    Past Surgical History:  Procedure Laterality Date  . FLEXIBLE BRONCHOSCOPY N/A 03/30/2016   Procedure: FLEXIBLE BRONCHOSCOPY;  Surgeon: Vilinda Boehringer, MD;  Location: [facility] ORS;  Service: Cardiopulmonary;  Laterality: N/A;  . IR ANGIOGRAM SELECTIVE EACH ADDITIONAL VESSEL  07/24/2017  . IR ANGIOGRAM SELECTIVE EACH ADDITIONAL VESSEL  07/24/2017  . IR ANGIOGRAM SELECTIVE EACH ADDITIONAL VESSEL  07/24/2017  . IR ANGIOGRAM SELECTIVE EACH ADDITIONAL VESSEL  07/24/2017  . IR ANGIOGRAM SELECTIVE EACH ADDITIONAL VESSEL  07/24/2017  . IR ANGIOGRAM SELECTIVE EACH ADDITIONAL VESSEL  07/24/2017  . IR ANGIOGRAM SELECTIVE EACH ADDITIONAL VESSEL  07/24/2017  . IR  ANGIOGRAM SELECTIVE EACH ADDITIONAL VESSEL  08/20/2017  . IR ANGIOGRAM VISCERAL SELECTIVE  07/24/2017  . IR ANGIOGRAM VISCERAL SELECTIVE  08/20/2017  . IR EMBO ARTERIAL NOT HEMORR HEMANG INC GUIDE ROADMAPPING  07/24/2017  . IR EMBO TUMOR ORGAN ISCHEMIA INFARCT INC GUIDE ROADMAPPING  08/20/2017  . IR RADIOLOGIST EVAL & MGMT  07/11/2017  . IR US GUIDE VASC ACCESS RIGHT  07/24/2017  . Ureter repair  as a child      Prior to Admission medications   Medication Sig Start Date End Date Taking? Authorizing Provider  calcium-vitamin D (OSCAL WITH D) 500-200 MG-UNIT tablet Take 1 tablet by mouth 2 (two) times daily. 09/20/16 11/23/17  Creola Corn, MD  enoxaparin (LOVENOX) 60 MG/0.6ML injection Inject 0.6 mLs (60 mg total) into the skin every 12 (twelve) hours. Patient not taking: Reported on 10/29/2017 07/09/17   Lequita Asal, MD  folic acid (FOLVITE) 1 MG tablet Take 1 tablet (1 mg total) by mouth daily. Patient not taking: Reported on 09/20/2017 11/16/16   Lequita Asal, MD  lenvatinib 8 mg daily dose (LENVIMA) 4 (2) MG capsule Take 2 capsules (8 mg total) by mouth daily. 09/20/17   Lequita Asal, MD  ondansetron (ZOFRAN) 4 MG tablet Take 1 tablet (4 mg total) every 8 (eight) hours as needed by mouth for nausea or vomiting. 10/29/17   Karen Kitchens, NP  oxyCODONE (OXY IR/ROXICODONE) 5 MG immediate release tablet Take 1-2 tablets every 4 hours as needed for pain 11/23/17   Karen Kitchens, NP  potassium chloride SA (K-DUR,KLOR-CON) 20 MEQ tablet Take 1 tablet (20 mEq total) daily by mouth. Patient not taking: Reported on 11/23/2017 10/15/17   Lequita Asal, MD    Allergies Nsaids and Sulfa antibiotics  Family History  Problem Relation Age of Onset  . Brain cancer Mother   . Lung cancer Maternal Uncle   . Cancer Sister        Metastatic cancer- unknown primary    Social History Social History   Tobacco Use  . Smoking status: Former Smoker    Packs/day: 0.50    Years: 26.00    Pack years: 13.00    Types: Cigarettes    Last attempt to quit: 06/08/2000    Years since quitting: 17.4  . Smokeless tobacco: Never Used  . Tobacco comment: Quit about 5 years ago  Substance Use Topics  . Alcohol use: Yes    Alcohol/week: 0.0 oz    Comment: occasionally beer  . Drug use: No    Review of Systems   Constitutional: No fever/chills. Eyes: No visual  changes. ENT: No sore throat. Cardiovascular: Denies chest pain. Respiratory: Positive for shortness of breath. Gastrointestinal: Positive for lower abdominal pain.  No nausea, no vomiting.  No diarrhea.  No constipation. Genitourinary: Negative for dysuria. Musculoskeletal: Negative for back pain. Skin: Negative for rash. Neurological: Negative for headaches, focal weakness or numbness.   ____________________________________________   PHYSICAL EXAM:  VITAL SIGNS: ED Triage Vitals  Enc Vitals Group     BP 11/26/17 2048 117/69     Pulse Rate 11/26/17 2048 (!) 109     Resp 11/26/17 2048 20     Temp 11/26/17 2048 97.9 F (36.6 C)     Temp Source 11/26/17 2048 Oral     SpO2 11/26/17 2048 99 %     Weight 11/26/17 2048 126 lb (57.2 kg)     Height 11/26/17 2048 5\' 11"  (1.803 m)  Head Circumference --      Peak Flow --      Pain Score 11/26/17 2047 3     Pain Loc --      Pain Edu? --      Excl. in [place]? --     Constitutional: Alert and oriented.  Chronically ill appearing and in no acute distress. Eyes: Conjunctivae are normal. PERRL. EOMI. Head: Atraumatic. Nose: No congestion/rhinnorhea. Mouth/Throat: Mucous membranes are moist.  Oropharynx non-erythematous. Neck: No stridor.   Cardiovascular: Normal rate, regular rhythm. Grossly normal heart sounds.  Good peripheral circulation. Respiratory: Normal respiratory effort.  No retractions. Lungs CTAB. Gastrointestinal: Soft and mildly tender to palpation right upper, mid and lower quadrants without rebound or guarding.  Hepatomegaly.  No distention. No abdominal bruits. No CVA tenderness. Musculoskeletal: No lower extremity tenderness nor edema.  No joint effusions. Neurologic:  Normal speech and language. No gross focal neurologic deficits are appreciated. No gait instability. Skin:  Skin is warm, dry and intact. No rash noted. Psychiatric: Mood and affect are normal. Speech and behavior are  normal.  ____________________________________________   LABS (all labs ordered are listed, but only abnormal results are displayed)  Labs Reviewed  CBC WITH DIFFERENTIAL/PLATELET - Abnormal; Notable for the following components:      Result Value   RBC 4.36 (*)    RDW 16.5 (*)    Lymphs Abs 0.7 (*)    All other components within normal limits  COMPREHENSIVE METABOLIC PANEL - Abnormal; Notable for the following components:   Sodium 134 (*)    Glucose, Bld 132 (*)    Calcium 8.3 (*)    Total Protein 8.5 (*)    Albumin 2.8 (*)    AST 128 (*)    Alkaline Phosphatase 230 (*)    Total Bilirubin 3.5 (*)    All other components within normal limits  TROPONIN I   ____________________________________________  EKG  ED ECG REPORT I, SUNG,JADE J, the attending physician, personally viewed and interpreted this ECG.   Date: 11/27/2017  EKG Time: 2113  Rate: 107  Rhythm: sinus tachycardia  Axis: Normal  Intervals:none  ST&T Change: Nonspecific  ____________________________________________  RADIOLOGY  Ct Chest W Contrast  Result Date: 11/26/2017 CLINICAL DATA:  Hepatocellular carcinoma. Stage IV adenocarcinoma of the lung. EXAM: CT CHEST, ABDOMEN, AND PELVIS WITH CONTRAST TECHNIQUE: Multidetector CT imaging of the chest, abdomen and pelvis was performed following the standard protocol during bolus administration of intravenous contrast. CONTRAST:  128mL ISOVUE-300 IOPAMIDOL (ISOVUE-300) INJECTION 61% COMPARISON:  09/17/2017 PET. Chest abdomen and pelvic CTs of 07/02/2017. FINDINGS: CT CHEST FINDINGS Cardiovascular: Tortuous thoracic aorta. Aortic atherosclerosis. Normal heart size, without pericardial effusion. Multivessel coronary artery atherosclerosis. Moderate volume pulmonary embolism to the left lower lobe, identified within segmental and subsegmental branches including on image 35/series 2. No findings of right heart strain. Mediastinum/Nodes: Small left supraclavicular nodes  are similar. No mediastinal or hilar adenopathy. Lungs/Pleura: Small left and trace right pleural effusion. The left-sided effusion is increased and right-sided effusion is new since the prior diagnostic CT. Secretions in dependent trachea. Moderate centrilobular emphysema. Development of basilar predominant bilateral pulmonary nodules since the prior diagnostic CT. Example 6 mm right lower lobe pulmonary nodule on image 90/series 4. The left lower lobe pulmonary nodule measures 9 mm on image 121/series 4. Medial right upper lobe and right perihilar radiation induced consolidation with traction bronchiectasis. Similar in morphology and distribution back to the prior diagnostic CT. Musculoskeletal: No acute osseous abnormality. CT ABDOMEN PELVIS  FINDINGS Hepatobiliary: New high right hepatic lobe low-density lesion at 7 mm on image 48/series 2. Index higha lateral segment left liver lobe lesion measures on the order of 2.0 cm on image 53/series 2 versus 1.5 cm on the prior diagnostic CT. New or progressive confluent lateral segment left liver lobe lesion measures on the order of 3.6 x 2.9 cm on image 58/series 2. This was likely present on 09/17/2017 PET. And new caudate lobe lesion since the prior diagnostic CT measures 2.9 x 2.7 cm on image 55/series 2. Grossly normal gallbladder, common duct dilatation. There is developing intrahepatic duct dilatation within the high left liver lobe, including on image 53/series 2. This is likely due to obstruction by central tumor. Pancreas: Normal, without mass or ductal dilatation. Spleen: Large splenule. Adrenals/Urinary Tract: Normal adrenal glands. Upper pole left renal scarring medially. Normal right kidney and urinary bladder. Stomach/Bowel: Varices about the gastroesophageal junction. Right-sided colonic wall thickening is moderate, including on image 88/series 2. Normal small bowel caliber. Vascular/Lymphatic: Advanced aortic and branch vessel atherosclerosis. Since the  prior diagnostic CT, progression of portal vein thrombus. This is grossly similar to on the prior PET. Presumed tumor thrombus within portal vein branches. This alters perfusion and makes evaluation of the extent of hepatic metastasis difficult. Thrombus also continues into the extrahepatic portal vein, including on image 66/series 2. No extension into the splenic vein or superior mesenteric veins. Tumor thrombus is also identified within the left hepatic vein, including on image 47/series 2. This is progressive since the prior diagnostic CT. This continues into the intra cardiac IVC, including on image 44/series 2. Gastrohepatic ligament node measures 8 mm on image 57/series 2 and is similar. Not pathologic by size criteria. No pelvic sidewall adenopathy. Reproductive: Normal prostate. Other: New small volume abdominopelvic ascites. Musculoskeletal: No acute osseous abnormality. IMPRESSION: CT CHEST IMPRESSION 1. Development of bilateral pulmonary nodules, consistent with metastatic disease. 2. Moderate volume left-sided pulmonary emboli. 3. Similar radiation change within the medial right lung. 4. Increased tiny left and new trace right pleural effusion. CT ABDOMEN AND PELVIS IMPRESSION 1. Progression of hepatic metastasis, especially when compared to the prior diagnostic CT of 07/02/2017. Direct comparison to the PET is difficult secondary to cross modality. 2. Progression of portal and hepatic vein thrombus with IVC thrombus continuing into the intracardiac IVC. 3. Right-sided colonic wall thickening is most likely due to portal venous hypertension and portal vein thrombus. Concurrent infectious colitis cannot be excluded. 4. New small volume abdominopelvic ascites. 5. Developing high left hepatic lobe intrahepatic biliary duct dilatation. Recommend attention on follow-up. These results will be called to the ordering clinician or representative by the Radiologist Assistant, and communication documented in the  PACS or zVision Dashboard. Electronically Signed   By: Abigail Miyamoto M.D.   On: 11/26/2017 16:36   Ct Abdomen Pelvis W Contrast  Result Date: 11/26/2017 CLINICAL DATA:  Hepatocellular carcinoma. Stage IV adenocarcinoma of the lung. EXAM: CT CHEST, ABDOMEN, AND PELVIS WITH CONTRAST TECHNIQUE: Multidetector CT imaging of the chest, abdomen and pelvis was performed following the standard protocol during bolus administration of intravenous contrast. CONTRAST:  188mL ISOVUE-300 IOPAMIDOL (ISOVUE-300) INJECTION 61% COMPARISON:  09/17/2017 PET. Chest abdomen and pelvic CTs of 07/02/2017. FINDINGS: CT CHEST FINDINGS Cardiovascular: Tortuous thoracic aorta. Aortic atherosclerosis. Normal heart size, without pericardial effusion. Multivessel coronary artery atherosclerosis. Moderate volume pulmonary embolism to the left lower lobe, identified within segmental and subsegmental branches including on image 35/series 2. No findings of right heart strain. Mediastinum/Nodes: Small  left supraclavicular nodes are similar. No mediastinal or hilar adenopathy. Lungs/Pleura: Small left and trace right pleural effusion. The left-sided effusion is increased and right-sided effusion is new since the prior diagnostic CT. Secretions in dependent trachea. Moderate centrilobular emphysema. Development of basilar predominant bilateral pulmonary nodules since the prior diagnostic CT. Example 6 mm right lower lobe pulmonary nodule on image 90/series 4. The left lower lobe pulmonary nodule measures 9 mm on image 121/series 4. Medial right upper lobe and right perihilar radiation induced consolidation with traction bronchiectasis. Similar in morphology and distribution back to the prior diagnostic CT. Musculoskeletal: No acute osseous abnormality. CT ABDOMEN PELVIS FINDINGS Hepatobiliary: New high right hepatic lobe low-density lesion at 7 mm on image 48/series 2. Index higha lateral segment left liver lobe lesion measures on the order of 2.0 cm  on image 53/series 2 versus 1.5 cm on the prior diagnostic CT. New or progressive confluent lateral segment left liver lobe lesion measures on the order of 3.6 x 2.9 cm on image 58/series 2. This was likely present on 09/17/2017 PET. And new caudate lobe lesion since the prior diagnostic CT measures 2.9 x 2.7 cm on image 55/series 2. Grossly normal gallbladder, common duct dilatation. There is developing intrahepatic duct dilatation within the high left liver lobe, including on image 53/series 2. This is likely due to obstruction by central tumor. Pancreas: Normal, without mass or ductal dilatation. Spleen: Large splenule. Adrenals/Urinary Tract: Normal adrenal glands. Upper pole left renal scarring medially. Normal right kidney and urinary bladder. Stomach/Bowel: Varices about the gastroesophageal junction. Right-sided colonic wall thickening is moderate, including on image 88/series 2. Normal small bowel caliber. Vascular/Lymphatic: Advanced aortic and branch vessel atherosclerosis. Since the prior diagnostic CT, progression of portal vein thrombus. This is grossly similar to on the prior PET. Presumed tumor thrombus within portal vein branches. This alters perfusion and makes evaluation of the extent of hepatic metastasis difficult. Thrombus also continues into the extrahepatic portal vein, including on image 66/series 2. No extension into the splenic vein or superior mesenteric veins. Tumor thrombus is also identified within the left hepatic vein, including on image 47/series 2. This is progressive since the prior diagnostic CT. This continues into the intra cardiac IVC, including on image 44/series 2. Gastrohepatic ligament node measures 8 mm on image 57/series 2 and is similar. Not pathologic by size criteria. No pelvic sidewall adenopathy. Reproductive: Normal prostate. Other: New small volume abdominopelvic ascites. Musculoskeletal: No acute osseous abnormality. IMPRESSION: CT CHEST IMPRESSION 1. Development  of bilateral pulmonary nodules, consistent with metastatic disease. 2. Moderate volume left-sided pulmonary emboli. 3. Similar radiation change within the medial right lung. 4. Increased tiny left and new trace right pleural effusion. CT ABDOMEN AND PELVIS IMPRESSION 1. Progression of hepatic metastasis, especially when compared to the prior diagnostic CT of 07/02/2017. Direct comparison to the PET is difficult secondary to cross modality. 2. Progression of portal and hepatic vein thrombus with IVC thrombus continuing into the intracardiac IVC. 3. Right-sided colonic wall thickening is most likely due to portal venous hypertension and portal vein thrombus. Concurrent infectious colitis cannot be excluded. 4. New small volume abdominopelvic ascites. 5. Developing high left hepatic lobe intrahepatic biliary duct dilatation. Recommend attention on follow-up. These results will be called to the ordering clinician or representative by the Radiologist Assistant, and communication documented in the PACS or zVision Dashboard. Electronically Signed   By: Abigail Miyamoto M.D.   On: 11/26/2017 16:36    ____________________________________________   PROCEDURES  Procedure(s) performed:  None  Procedures  Critical Care performed: No  ____________________________________________   INITIAL IMPRESSION / ASSESSMENT AND PLAN / ED COURSE  As part of my medical decision making, I reviewed the following data within the [facility] History obtained from family, Nursing notes reviewed and incorporated, Labs reviewed, EKG interpreted, Old chart reviewed, Radiograph reviewed, Discussed with admitting physician and Notes from prior ED visits.   66 year old male with hepatic cell carcinoma referred to the ED by his oncologist for admission for new left-sided pulmonary emboli, progression of HCC, and progression of known portal vein thrombus extending into intracardiac IVC.  This is complicated by the fact  that patient has not been taking his Lovenox for the past 3-4 weeks as instructed.  On CT there is also a suggestion of right colonic wall thickening possibly representing infectious colitis.  Will initiate heparin bolus and infusion, Augmentin for possible colitis and discussed with hospitalist to evaluate patient in the emergency department for admission.      ____________________________________________   FINAL CLINICAL IMPRESSION(S) / ED DIAGNOSES  Final diagnoses:  Shortness of breath  PE (pulmonary thromboembolism) ([place])  Thrombus     ED Discharge Orders    None       Note:  This document was prepared using Dragon voice recognition software and may include unintentional dictation errors.    Paulette Blanch, MD 11/27/17 [phone]

## 2017-11-27 NOTE — H&P (Signed)
Gerald [name] is an 66 y.o. male.   Chief Complaint: Shortness of breath HPI: The patient with past medical history of static hepatocellular carcinoma as well as lung cancer presents to the emergency department with shortness of breath exertion which is not distinctly different than his baseline.  The patient denies any acute complaints at this time.  He was encouraged to seek evaluation after a large venous thrombosis from his portal vein extending into the intracardiac IVC was discovered.  He was started on heparin drip prior to the emergency department staff called the hospitalist service for admission.  Past Medical History:  Diagnosis Date  . Arthritis    Rheumatoid arthritis  . Cancer ([place])    lung ca   . Collagen vascular disease (H[facility])    RA  . Dyspnea   . Hepatitis    b and c  . Mass of lung   . Pneumonia   . Pneumothorax    spontaneous  . Renal disorder    as a child    Past Surgical History:  Procedure Laterality Date  . FLEXIBLE BRONCHOSCOPY N/A 03/30/2016   Procedure: FLEXIBLE BRONCHOSCOPY;  Surgeon: Vilinda Boehringer, MD;  Location: [facility] ORS;  Service: Cardiopulmonary;  Laterality: N/A;  . IR ANGIOGRAM SELECTIVE EACH ADDITIONAL VESSEL  07/24/2017  . IR ANGIOGRAM SELECTIVE EACH ADDITIONAL VESSEL  07/24/2017  . IR ANGIOGRAM SELECTIVE EACH ADDITIONAL VESSEL  07/24/2017  . IR ANGIOGRAM SELECTIVE EACH ADDITIONAL VESSEL  07/24/2017  . IR ANGIOGRAM SELECTIVE EACH ADDITIONAL VESSEL  07/24/2017  . IR ANGIOGRAM SELECTIVE EACH ADDITIONAL VESSEL  07/24/2017  . IR ANGIOGRAM SELECTIVE EACH ADDITIONAL VESSEL  07/24/2017  . IR ANGIOGRAM SELECTIVE EACH ADDITIONAL VESSEL  08/20/2017  . IR ANGIOGRAM VISCERAL SELECTIVE  07/24/2017  . IR ANGIOGRAM VISCERAL SELECTIVE  08/20/2017  . IR EMBO ARTERIAL NOT HEMORR HEMANG INC GUIDE ROADMAPPING  07/24/2017  . IR EMBO TUMOR ORGAN ISCHEMIA INFARCT INC GUIDE ROADMAPPING  08/20/2017  . IR RADIOLOGIST EVAL & MGMT  07/11/2017  . IR US GUIDE VASC ACCESS RIGHT   07/24/2017  . Ureter repair as a child      Family History  Problem Relation Age of Onset  . Brain cancer Mother   . Lung cancer Maternal Uncle   . Cancer Sister        Metastatic cancer- unknown primary   Social History:  reports that he quit smoking about 17 years ago. His smoking use included cigarettes. He has a 13.00 pack-year smoking history. he has never used smokeless tobacco. He reports that he drinks alcohol. He reports that he does not use drugs.  Allergies:  Allergies  Allergen Reactions  . Nsaids Other (See Comments)    Can take NSAIDs short term but cannot be on it long term d/t renal insuff  . Sulfa Antibiotics Other (See Comments)    Reaction:  GI upset      (Not in a hospital admission)  Results for orders placed or performed during the hospital encounter of 11/27/17 (from the past 48 hour(s))  Troponin I     Status: None   Collection Time: 11/26/17  9:04 PM  Result Value Ref Range   Troponin I <0.03 <0.03 ng/mL  CBC with Differential     Status: Abnormal   Collection Time: 11/26/17  9:04 PM  Result Value Ref Range   WBC 6.7 3.8 - 10.6 K/uL   RBC 4.36 (L) 4.40 - 5.90 MIL/uL   Hemoglobin 14.1 13.0 - 18.0 g/dL  HCT 43.0 40.0 - 52.0 %   MCV 98.6 80.0 - 100.0 fL   MCH 32.3 26.0 - 34.0 pg   MCHC 32.7 32.0 - 36.0 g/dL   RDW 16.5 (H) 11.5 - 14.5 %   Platelets 163 150 - 440 K/uL   Neutrophils Relative % 74 %   Neutro Abs 4.9 1.4 - 6.5 K/uL   Lymphocytes Relative 11 %   Lymphs Abs 0.7 (L) 1.0 - 3.6 K/uL   Monocytes Relative 12 %   Monocytes Absolute 0.8 0.2 - 1.0 K/uL   Eosinophils Relative 2 %   Eosinophils Absolute 0.1 0 - 0.7 K/uL   Basophils Relative 1 %   Basophils Absolute 0.1 0 - 0.1 K/uL  Comprehensive metabolic panel     Status: Abnormal   Collection Time: 11/26/17  9:04 PM  Result Value Ref Range   Sodium 134 (L) 135 - 145 mmol/L   Potassium 4.5 3.5 - 5.1 mmol/L   Chloride 101 101 - 111 mmol/L   CO2 25 22 - 32 mmol/L   Glucose, Bld 132 (H)  65 - 99 mg/dL   BUN 12 6 - 20 mg/dL   Creatinine, Ser 0.66 0.61 - 1.24 mg/dL   Calcium 8.3 (L) 8.9 - 10.3 mg/dL   Total Protein 8.5 (H) 6.5 - 8.1 g/dL   Albumin 2.8 (L) 3.5 - 5.0 g/dL   AST 128 (H) 15 - 41 U/L   ALT 63 17 - 63 U/L   Alkaline Phosphatase 230 (H) 38 - 126 U/L   Total Bilirubin 3.5 (H) 0.3 - 1.2 mg/dL   GFR calc non Af Amer >60 >60 mL/min   GFR calc Af Amer >60 >60 mL/min    Comment: (NOTE) The eGFR has been calculated using the CKD EPI equation. This calculation has not been validated in all clinical situations. eGFR's persistently <60 mL/min signify possible Chronic Kidney Disease.    Anion gap 8 5 - 15  APTT     Status: Abnormal   Collection Time: 11/27/17  1:14 AM  Result Value Ref Range   aPTT >160 (HH) 24 - 36 seconds    Comment:        IF BASELINE aPTT IS ELEVATED, SUGGEST PATIENT RISK ASSESSMENT BE USED TO DETERMINE APPROPRIATE ANTICOAGULANT THERAPY. CRITICAL RESULT CALLED TO, READ BACK BY AND VERIFIED WITH: ALLISON PATE AT 0441 11/27/17 ALV   Protime-INR     Status: Abnormal   Collection Time: 11/27/17  1:14 AM  Result Value Ref Range   Prothrombin Time 15.6 (H) 11.4 - 15.2 seconds   INR 1.25   Heparin level (unfractionated)     Status: None   Collection Time: 11/27/17  1:14 AM  Result Value Ref Range   Heparin Unfractionated 0.58 0.30 - 0.70 IU/mL    Comment:        IF HEPARIN RESULTS ARE BELOW EXPECTED VALUES, AND PATIENT DOSAGE HAS BEEN CONFIRMED, SUGGEST FOLLOW UP TESTING OF ANTITHROMBIN III LEVELS.    Ct Chest W Contrast  Result Date: 11/26/2017 CLINICAL DATA:  Hepatocellular carcinoma. Stage IV adenocarcinoma of the lung. EXAM: CT CHEST, ABDOMEN, AND PELVIS WITH CONTRAST TECHNIQUE: Multidetector CT imaging of the chest, abdomen and pelvis was performed following the standard protocol during bolus administration of intravenous contrast. CONTRAST:  164m ISOVUE-300 IOPAMIDOL (ISOVUE-300) INJECTION 61% COMPARISON:  09/17/2017 PET. Chest  abdomen and pelvic CTs of 07/02/2017. FINDINGS: CT CHEST FINDINGS Cardiovascular: Tortuous thoracic aorta. Aortic atherosclerosis. Normal heart size, without pericardial effusion. Multivessel coronary artery  atherosclerosis. Moderate volume pulmonary embolism to the left lower lobe, identified within segmental and subsegmental branches including on image 35/series 2. No findings of right heart strain. Mediastinum/Nodes: Small left supraclavicular nodes are similar. No mediastinal or hilar adenopathy. Lungs/Pleura: Small left and trace right pleural effusion. The left-sided effusion is increased and right-sided effusion is new since the prior diagnostic CT. Secretions in dependent trachea. Moderate centrilobular emphysema. Development of basilar predominant bilateral pulmonary nodules since the prior diagnostic CT. Example 6 mm right lower lobe pulmonary nodule on image 90/series 4. The left lower lobe pulmonary nodule measures 9 mm on image 121/series 4. Medial right upper lobe and right perihilar radiation induced consolidation with traction bronchiectasis. Similar in morphology and distribution back to the prior diagnostic CT. Musculoskeletal: No acute osseous abnormality. CT ABDOMEN PELVIS FINDINGS Hepatobiliary: New high right hepatic lobe low-density lesion at 7 mm on image 48/series 2. Index higha lateral segment left liver lobe lesion measures on the order of 2.0 cm on image 53/series 2 versus 1.5 cm on the prior diagnostic CT. New or progressive confluent lateral segment left liver lobe lesion measures on the order of 3.6 x 2.9 cm on image 58/series 2. This was likely present on 09/17/2017 PET. And new caudate lobe lesion since the prior diagnostic CT measures 2.9 x 2.7 cm on image 55/series 2. Grossly normal gallbladder, common duct dilatation. There is developing intrahepatic duct dilatation within the high left liver lobe, including on image 53/series 2. This is likely due to obstruction by central tumor.  Pancreas: Normal, without mass or ductal dilatation. Spleen: Large splenule. Adrenals/Urinary Tract: Normal adrenal glands. Upper pole left renal scarring medially. Normal right kidney and urinary bladder. Stomach/Bowel: Varices about the gastroesophageal junction. Right-sided colonic wall thickening is moderate, including on image 88/series 2. Normal small bowel caliber. Vascular/Lymphatic: Advanced aortic and branch vessel atherosclerosis. Since the prior diagnostic CT, progression of portal vein thrombus. This is grossly similar to on the prior PET. Presumed tumor thrombus within portal vein branches. This alters perfusion and makes evaluation of the extent of hepatic metastasis difficult. Thrombus also continues into the extrahepatic portal vein, including on image 66/series 2. No extension into the splenic vein or superior mesenteric veins. Tumor thrombus is also identified within the left hepatic vein, including on image 47/series 2. This is progressive since the prior diagnostic CT. This continues into the intra cardiac IVC, including on image 44/series 2. Gastrohepatic ligament node measures 8 mm on image 57/series 2 and is similar. Not pathologic by size criteria. No pelvic sidewall adenopathy. Reproductive: Normal prostate. Other: New small volume abdominopelvic ascites. Musculoskeletal: No acute osseous abnormality. IMPRESSION: CT CHEST IMPRESSION 1. Development of bilateral pulmonary nodules, consistent with metastatic disease. 2. Moderate volume left-sided pulmonary emboli. 3. Similar radiation change within the medial right lung. 4. Increased tiny left and new trace right pleural effusion. CT ABDOMEN AND PELVIS IMPRESSION 1. Progression of hepatic metastasis, especially when compared to the prior diagnostic CT of 07/02/2017. Direct comparison to the PET is difficult secondary to cross modality. 2. Progression of portal and hepatic vein thrombus with IVC thrombus continuing into the intracardiac IVC. 3.  Right-sided colonic wall thickening is most likely due to portal venous hypertension and portal vein thrombus. Concurrent infectious colitis cannot be excluded. 4. New small volume abdominopelvic ascites. 5. Developing high left hepatic lobe intrahepatic biliary duct dilatation. Recommend attention on follow-up. These results will be called to the ordering clinician or representative by the Radiologist Assistant, and communication documented in  the PACS or zVision Dashboard. Electronically Signed   By: Abigail Miyamoto M.D.   On: 11/26/2017 16:36   Ct Abdomen Pelvis W Contrast  Result Date: 11/26/2017 CLINICAL DATA:  Hepatocellular carcinoma. Stage IV adenocarcinoma of the lung. EXAM: CT CHEST, ABDOMEN, AND PELVIS WITH CONTRAST TECHNIQUE: Multidetector CT imaging of the chest, abdomen and pelvis was performed following the standard protocol during bolus administration of intravenous contrast. CONTRAST:  169m ISOVUE-300 IOPAMIDOL (ISOVUE-300) INJECTION 61% COMPARISON:  09/17/2017 PET. Chest abdomen and pelvic CTs of 07/02/2017. FINDINGS: CT CHEST FINDINGS Cardiovascular: Tortuous thoracic aorta. Aortic atherosclerosis. Normal heart size, without pericardial effusion. Multivessel coronary artery atherosclerosis. Moderate volume pulmonary embolism to the left lower lobe, identified within segmental and subsegmental branches including on image 35/series 2. No findings of right heart strain. Mediastinum/Nodes: Small left supraclavicular nodes are similar. No mediastinal or hilar adenopathy. Lungs/Pleura: Small left and trace right pleural effusion. The left-sided effusion is increased and right-sided effusion is new since the prior diagnostic CT. Secretions in dependent trachea. Moderate centrilobular emphysema. Development of basilar predominant bilateral pulmonary nodules since the prior diagnostic CT. Example 6 mm right lower lobe pulmonary nodule on image 90/series 4. The left lower lobe pulmonary nodule measures 9  mm on image 121/series 4. Medial right upper lobe and right perihilar radiation induced consolidation with traction bronchiectasis. Similar in morphology and distribution back to the prior diagnostic CT. Musculoskeletal: No acute osseous abnormality. CT ABDOMEN PELVIS FINDINGS Hepatobiliary: New high right hepatic lobe low-density lesion at 7 mm on image 48/series 2. Index higha lateral segment left liver lobe lesion measures on the order of 2.0 cm on image 53/series 2 versus 1.5 cm on the prior diagnostic CT. New or progressive confluent lateral segment left liver lobe lesion measures on the order of 3.6 x 2.9 cm on image 58/series 2. This was likely present on 09/17/2017 PET. And new caudate lobe lesion since the prior diagnostic CT measures 2.9 x 2.7 cm on image 55/series 2. Grossly normal gallbladder, common duct dilatation. There is developing intrahepatic duct dilatation within the high left liver lobe, including on image 53/series 2. This is likely due to obstruction by central tumor. Pancreas: Normal, without mass or ductal dilatation. Spleen: Large splenule. Adrenals/Urinary Tract: Normal adrenal glands. Upper pole left renal scarring medially. Normal right kidney and urinary bladder. Stomach/Bowel: Varices about the gastroesophageal junction. Right-sided colonic wall thickening is moderate, including on image 88/series 2. Normal small bowel caliber. Vascular/Lymphatic: Advanced aortic and branch vessel atherosclerosis. Since the prior diagnostic CT, progression of portal vein thrombus. This is grossly similar to on the prior PET. Presumed tumor thrombus within portal vein branches. This alters perfusion and makes evaluation of the extent of hepatic metastasis difficult. Thrombus also continues into the extrahepatic portal vein, including on image 66/series 2. No extension into the splenic vein or superior mesenteric veins. Tumor thrombus is also identified within the left hepatic vein, including on image  47/series 2. This is progressive since the prior diagnostic CT. This continues into the intra cardiac IVC, including on image 44/series 2. Gastrohepatic ligament node measures 8 mm on image 57/series 2 and is similar. Not pathologic by size criteria. No pelvic sidewall adenopathy. Reproductive: Normal prostate. Other: New small volume abdominopelvic ascites. Musculoskeletal: No acute osseous abnormality. IMPRESSION: CT CHEST IMPRESSION 1. Development of bilateral pulmonary nodules, consistent with metastatic disease. 2. Moderate volume left-sided pulmonary emboli. 3. Similar radiation change within the medial right lung. 4. Increased tiny left and new trace right pleural effusion.  CT ABDOMEN AND PELVIS IMPRESSION 1. Progression of hepatic metastasis, especially when compared to the prior diagnostic CT of 07/02/2017. Direct comparison to the PET is difficult secondary to cross modality. 2. Progression of portal and hepatic vein thrombus with IVC thrombus continuing into the intracardiac IVC. 3. Right-sided colonic wall thickening is most likely due to portal venous hypertension and portal vein thrombus. Concurrent infectious colitis cannot be excluded. 4. New small volume abdominopelvic ascites. 5. Developing high left hepatic lobe intrahepatic biliary duct dilatation. Recommend attention on follow-up. These results will be called to the ordering clinician or representative by the Radiologist Assistant, and communication documented in the PACS or zVision Dashboard. Electronically Signed   By: Abigail Miyamoto M.D.   On: 11/26/2017 16:36    Review of Systems  Constitutional: Negative for chills and fever.  HENT: Negative for sore throat and tinnitus.   Eyes: Negative for blurred vision and redness.  Respiratory: Positive for shortness of breath. Negative for cough.   Cardiovascular: Negative for chest pain, palpitations, orthopnea and PND.  Gastrointestinal: Negative for abdominal pain, diarrhea, nausea and  vomiting.  Genitourinary: Negative for dysuria, frequency and urgency.  Musculoskeletal: Negative for joint pain and myalgias.  Skin: Negative for rash.       No lesions  Neurological: Negative for speech change, focal weakness and weakness.  Endo/Heme/Allergies: Does not bruise/bleed easily.       No temperature intolerance  Psychiatric/Behavioral: Negative for depression and suicidal ideas.    Blood pressure (!) 91/58, pulse (!) 47, temperature 97.9 F (36.6 C), temperature source Oral, resp. rate 15, height 5' 11"  (1.803 m), weight 57.2 kg (126 lb), SpO2 94 %. Physical Exam  Nursing note and vitals reviewed. Constitutional: He is oriented to person, place, and time. He appears well-developed. He appears cachectic. No distress.  HENT:  Head: Normocephalic and atraumatic.  Mouth/Throat: Oropharynx is clear and moist.  Eyes: Conjunctivae and EOM are normal. Pupils are equal, round, and reactive to light. No scleral icterus.  Neck: Normal range of motion. Neck supple. No JVD present. No tracheal deviation present. No thyromegaly present.  Cardiovascular: Normal rate, regular rhythm and normal heart sounds. Exam reveals no gallop and no friction rub.  No murmur heard. Respiratory: Effort normal and breath sounds normal. No respiratory distress.  GI: Soft. Bowel sounds are normal. He exhibits no distension. There is no tenderness.  Genitourinary:  Genitourinary Comments: Deferred  Musculoskeletal: Normal range of motion. He exhibits no edema.  Lymphadenopathy:    He has no cervical adenopathy.  Neurological: He is alert and oriented to person, place, and time. No cranial nerve deficit.  Skin: Skin is warm and dry. No rash noted. No erythema.  Psychiatric: He has a normal mood and affect. His behavior is normal. Judgment and thought content normal.     Assessment/Plan This is a 66 year old male admitted for large pulmonary embolus. 1.  PE: Extensive; continue heparin drip.  I have  consulted vascular surgery for thrombectomy. 2.  Metastatic cancer: CT of chest and abdomen shows progression of metastasis.  Is currently undergoing chemotherapy and has already completed radiation.  Chemotherapy per oncology. 3.  Colitis: Concern for infectious etiology.  The patient was started on Augmentin in the emergency department.  We will add anaerobic coverage. 4.  Hyponatremia: Likely secondary to lung malignancy.  Hydrate with normal saline. 5.  DVT prophylaxis: Lovenox 6.  GI prophylaxis: None The patient is a full code.  Time spent on admission orders and patient care  approximately 45 minutes   Harrie Foreman, MD 11/27/2017, 7:33 AM

## 2017-11-27 NOTE — Consult Note (Signed)
[facility] Vascular Consult Note  MRN : [id number]  Gerald [name] is a 66 y.o. (1950-12-18) male who presents with chief complaint of  Chief Complaint  Patient presents with  . Shortness of Breath  .  History of Present Illness: I am asked to evaluate the patient by Dr. Marcille Blanco.  The patient is a 66 year old gentleman with known metastatic hepatocellular carcinoma who presented to the hospital after new PE was noted on a CT scan performed yesterday.  The patient states he feels about the same as usual.  He denies shortness of breath.  He denies hemoptysis.  No change in his abdominal pain symptoms.  No change in his lower extremity swelling.  Patient is not on oxygen at the time of my interview and he is talking in complete sentences without difficulty.  He also states he is gotten up to go to the bathroom without becoming short of breath.   Current Meds  Medication Sig  . Calcium Carb-Cholecalciferol (CALCIUM-VITAMIN D) 500-200 MG-UNIT tablet Take 1 tablet by mouth 2 (two) times daily.  Marland Kitchen lenvatinib 8 mg daily dose (LENVIMA) 4 (2) MG capsule Take 2 capsules (8 mg total) by mouth daily.  . ondansetron (ZOFRAN) 4 MG tablet Take 1 tablet (4 mg total) every 8 (eight) hours as needed by mouth for nausea or vomiting.  Marland Kitchen oxyCODONE (OXY IR/ROXICODONE) 5 MG immediate release tablet Take 1-2 tablets every 4 hours as needed for pain    Past Medical History:  Diagnosis Date  . Arthritis    Rheumatoid arthritis  . Cancer ([place])    lung ca   . Collagen vascular disease (HCC)    RA  . Dyspnea   . Hepatitis    b and c  . Mass of lung   . Pneumonia   . Pneumothorax    spontaneous  . Renal disorder    as a child    Past Surgical History:  Procedure Laterality Date  . FLEXIBLE BRONCHOSCOPY N/A 03/30/2016   Procedure: FLEXIBLE BRONCHOSCOPY;  Surgeon: Vilinda Boehringer, MD;  Location: [facility] ORS;  Service: Cardiopulmonary;  Laterality: N/A;  . IR ANGIOGRAM SELECTIVE EACH  ADDITIONAL VESSEL  07/24/2017  . IR ANGIOGRAM SELECTIVE EACH ADDITIONAL VESSEL  07/24/2017  . IR ANGIOGRAM SELECTIVE EACH ADDITIONAL VESSEL  07/24/2017  . IR ANGIOGRAM SELECTIVE EACH ADDITIONAL VESSEL  07/24/2017  . IR ANGIOGRAM SELECTIVE EACH ADDITIONAL VESSEL  07/24/2017  . IR ANGIOGRAM SELECTIVE EACH ADDITIONAL VESSEL  07/24/2017  . IR ANGIOGRAM SELECTIVE EACH ADDITIONAL VESSEL  07/24/2017  . IR ANGIOGRAM SELECTIVE EACH ADDITIONAL VESSEL  08/20/2017  . IR ANGIOGRAM VISCERAL SELECTIVE  07/24/2017  . IR ANGIOGRAM VISCERAL SELECTIVE  08/20/2017  . IR EMBO ARTERIAL NOT HEMORR HEMANG INC GUIDE ROADMAPPING  07/24/2017  . IR EMBO TUMOR ORGAN ISCHEMIA INFARCT INC GUIDE ROADMAPPING  08/20/2017  . IR RADIOLOGIST EVAL & MGMT  07/11/2017  . IR US GUIDE VASC ACCESS RIGHT  07/24/2017  . Ureter repair as a child      Social History Social History   Tobacco Use  . Smoking status: Former Smoker    Packs/day: 0.50    Years: 26.00    Pack years: 13.00    Types: Cigarettes    Last attempt to quit: 06/08/2000    Years since quitting: 17.4  . Smokeless tobacco: Never Used  . Tobacco comment: Quit about 5 years ago  Substance Use Topics  . Alcohol use: Yes    Alcohol/week: 0.0 oz  Comment: occasionally beer  . Drug use: No    Family History Family History  Problem Relation Age of Onset  . Brain cancer Mother   . Lung cancer Maternal Uncle   . Cancer Sister        Metastatic cancer- unknown primary  No family history of bleeding/clotting disorders, porphyria or autoimmune disease   Allergies  Allergen Reactions  . Nsaids Other (See Comments)    Can take NSAIDs short term but cannot be on it long term d/t renal insuff  . Sulfa Antibiotics Other (See Comments)    Reaction:  GI upset      REVIEW OF SYSTEMS (Negative unless checked)  Constitutional: [x] Weight loss  [] Fever  [] Chills Cardiac: [] Chest pain   [] Chest pressure   [] Palpitations   [] Shortness of breath when laying flat   [] Shortness of  breath at rest   [x] Shortness of breath with exertion. Vascular:  [] Pain in legs with walking   [] Pain in legs at rest   [] Pain in legs when laying flat   [] Claudication   [] Pain in feet when walking  [] Pain in feet at rest  [] Pain in feet when laying flat   [] History of DVT   [] Phlebitis   [] Swelling in legs   [] Varicose veins   [] Non-healing ulcers Pulmonary:   [] Uses home oxygen   [] Productive cough   [] Hemoptysis   [] Wheeze  [] COPD   [] Asthma Neurologic:  [] Dizziness  [] Blackouts   [] Seizures   [] History of stroke   [] History of TIA  [] Aphasia   [] Temporary blindness   [] Dysphagia   [] Weakness or numbness in arms   [] Weakness or numbness in legs Musculoskeletal:  [] Arthritis   [] Joint swelling   [] Joint pain   [] Low back pain Hematologic:  [] Easy bruising  [] Easy bleeding   [] Hypercoagulable state   [] Anemic  [] Hepatitis Gastrointestinal:  [] Blood in stool   [] Vomiting blood  [] Gastroesophageal reflux/heartburn   [] Difficulty swallowing. Genitourinary:  [] Chronic kidney disease   [] Difficult urination  [] Frequent urination  [] Burning with urination   [] Blood in urine Skin:  [] Rashes   [] Ulcers   [] Wounds Psychological:  [] History of anxiety   []  History of major depression.   Physical Examination  Vitals:   11/27/17 0700 11/27/17 0906 11/27/17 1314 11/27/17 1316  BP: 99/77 112/65 (!) 97/59 (!) 103/56  Pulse: 99 (!) 102 (!) 109 (!) 109  Resp: (!) 31 18 18    Temp:  98.2 F (36.8 C) 98.5 F (36.9 C)   TempSrc:  Oral Oral   SpO2: 96% 97% 95% 95%  Weight:  58.9 kg (129 lb 12.8 oz)    Height:  5\' 11"  (1.803 m)     Body mass index is 18.1 kg/m.  Head: [place]/AT, No temporalis wasting. Prominent temp pulse not noted. Ear/Nose/Throat: Nares w/o erythema or drainage, oropharynx w/o obsrtuction, Mallampati score: 3.  Dentition poor.  Eyes: PERRLA, Sclera nonicteric.  Neck: Supple, no nuchal rigidity.  No bruit or JVD.  Pulmonary:  Breath sounds e diminished on the right with rhonchi noted, no  use of accessory muscles.  Cardiac: RRR, normal S1, S2, no Murmurs, rubs or gallops. Vascular: Radial pulses 2+ well most of the gumline angiogram of the abdomen Gastrointestinal: soft, non-tender, non-distended.  Musculoskeletal: Moves all extremities.  No deformity or atrophy. No edema. Neurologic: CN 2-12 intact. Symmetrical.  Speech is fluent.  Psychiatric: Judgment intact, Mood & affect appropriate for pt's clinical situation. Dermatologic: No rashes or ulcers noted.  No cellulitis or open wounds.  Lymph : No Cervical,  or Inguinal lymphadenopathy.      CBC Lab Results  Component Value Date   WBC 6.7 11/26/2017   HGB 14.1 11/26/2017   HCT 43.0 11/26/2017   MCV 98.6 11/26/2017   PLT 163 11/26/2017    BMET    Component Value Date/Time   NA 134 (L) 11/26/2017 2104   K 4.5 11/26/2017 2104   CL 101 11/26/2017 2104   CO2 25 11/26/2017 2104   GLUCOSE 132 (H) 11/26/2017 2104   BUN 12 11/26/2017 2104   CREATININE 0.66 11/26/2017 2104   CALCIUM 8.3 (L) 11/26/2017 2104   GFRNONAA >60 11/26/2017 2104   GFRAA >60 11/26/2017 2104   Estimated Creatinine Clearance: 76.7 mL/min (by C-G formula based on SCr of 0.66 mg/dL).  COAG Lab Results  Component Value Date   INR 1.25 11/27/2017   INR 1.06 08/20/2017   INR 1.03 07/24/2017    Radiology I have personally reviewed the CT scan and also reviewed it with Dr. Anselm Pancoast.  The patient has a small relatively small pulmonary embolism in the left which is not located very proximally.  He does have a mass in his inferior vena cava at the hepatic level which could represent thrombus but also could represent metastatic disease.   Assessment/Plan #1 pulmonary embolism: At this time I do not recommend thrombolysis for several reasons; the patient is not in significant respiratory distress at the present time is not even on supplemental oxygen.  In association with his tumor burden and overall metastatic disease he is a high risk for thrombolytic  therapy.  I would therefore defer at the present time unless he were to become significantly more symptomatic because of recurrent thrombus.  Also it must be considered that this does not certainly represent thrombus but is actually tumor embolization.  Also filter is not indicated as the area of concern within the vena cava is at the hepatic level and there is no place to place a filter between the mass and the atrium.  Anticoagulation must be continued and we discussed this at length currently he is on a heparin drip.  It is unfortunate that he found giving himself the Lovenox injections to be unacceptable as this really given his hepatic disease would be the best way to anticoagulate him.  I would defer the decision on what medication to choose next 2 oncology.  2.  Metastatic cancer: CT of chest and abdomen shows progression of metastasis.  Is currently undergoing chemotherapy and has already completed radiation.  Chemotherapy per oncology.  3.    Hyponatremia: Likely secondary to lung malignancy.  Hydrate with normal saline.    Hortencia Pilar, MD  11/27/2017 5:30 PM

## 2017-11-27 NOTE — ED Notes (Signed)
Test: PTT Critical Value: >160  Name of Provider Notified: Marcille Blanco, MD

## 2017-11-28 ENCOUNTER — Telehealth: Payer: Self-pay | Admitting: Oncology

## 2017-11-28 DIAGNOSIS — R0602 Shortness of breath: Secondary | ICD-10-CM

## 2017-11-28 DIAGNOSIS — I2699 Other pulmonary embolism without acute cor pulmonale: Principal | ICD-10-CM

## 2017-11-28 DIAGNOSIS — G893 Neoplasm related pain (acute) (chronic): Secondary | ICD-10-CM

## 2017-11-28 DIAGNOSIS — R5383 Other fatigue: Secondary | ICD-10-CM

## 2017-11-28 DIAGNOSIS — C3411 Malignant neoplasm of upper lobe, right bronchus or lung: Secondary | ICD-10-CM

## 2017-11-28 DIAGNOSIS — Z8701 Personal history of pneumonia (recurrent): Secondary | ICD-10-CM

## 2017-11-28 DIAGNOSIS — C7951 Secondary malignant neoplasm of bone: Secondary | ICD-10-CM

## 2017-11-28 DIAGNOSIS — I8290 Acute embolism and thrombosis of unspecified vein: Secondary | ICD-10-CM

## 2017-11-28 DIAGNOSIS — G629 Polyneuropathy, unspecified: Secondary | ICD-10-CM

## 2017-11-28 DIAGNOSIS — R531 Weakness: Secondary | ICD-10-CM

## 2017-11-28 DIAGNOSIS — Z79899 Other long term (current) drug therapy: Secondary | ICD-10-CM

## 2017-11-28 DIAGNOSIS — C220 Liver cell carcinoma: Secondary | ICD-10-CM

## 2017-11-28 DIAGNOSIS — M069 Rheumatoid arthritis, unspecified: Secondary | ICD-10-CM

## 2017-11-28 DIAGNOSIS — Z8673 Personal history of transient ischemic attack (TIA), and cerebral infarction without residual deficits: Secondary | ICD-10-CM

## 2017-11-28 DIAGNOSIS — R948 Abnormal results of function studies of other organs and systems: Secondary | ICD-10-CM

## 2017-11-28 DIAGNOSIS — I820 Budd-Chiari syndrome: Secondary | ICD-10-CM

## 2017-11-28 DIAGNOSIS — Z87891 Personal history of nicotine dependence: Secondary | ICD-10-CM

## 2017-11-28 LAB — HEPARIN LEVEL (UNFRACTIONATED)
HEPARIN UNFRACTIONATED: 0.3 [IU]/mL (ref 0.30–0.70)
HEPARIN UNFRACTIONATED: 0.32 [IU]/mL (ref 0.30–0.70)

## 2017-11-28 LAB — CBC
HCT: 38.9 % — ABNORMAL LOW (ref 40.0–52.0)
HEMOGLOBIN: 13.2 g/dL (ref 13.0–18.0)
MCH: 33.1 pg (ref 26.0–34.0)
MCHC: 33.9 g/dL (ref 32.0–36.0)
MCV: 97.6 fL (ref 80.0–100.0)
PLATELETS: 143 10*3/uL — AB (ref 150–440)
RBC: 3.99 MIL/uL — ABNORMAL LOW (ref 4.40–5.90)
RDW: 16.5 % — ABNORMAL HIGH (ref 11.5–14.5)
WBC: 8.2 10*3/uL (ref 3.8–10.6)

## 2017-11-28 LAB — HEMOGLOBIN A1C
Hgb A1c MFr Bld: 5.1 % (ref 4.8–5.6)
Mean Plasma Glucose: 100 mg/dL

## 2017-11-28 MED ORDER — RIVAROXABAN 15 MG PO TABS: 15.0000 mg | ORAL_TABLET | Freq: Two times a day (BID) | ORAL | 0 refills | Status: DC

## 2017-11-28 MED ORDER — RIVAROXABAN 20 MG PO TABS: 20.0000 mg | ORAL_TABLET | Freq: Every day | ORAL | Status: DC

## 2017-11-28 MED ORDER — APIXABAN 5 MG PO TABS: 5.0000 mg | ORAL_TABLET | Freq: Two times a day (BID) | ORAL | Status: DC

## 2017-11-28 MED ORDER — APIXABAN 5 MG PO TABS: 10.0000 mg | ORAL_TABLET | Freq: Two times a day (BID) | ORAL | Status: DC

## 2017-11-28 MED ORDER — PREMIER PROTEIN SHAKE: 11.0000 [oz_av] | Freq: Two times a day (BID) | ORAL | 1 refills | Status: AC

## 2017-11-28 MED ORDER — RIVAROXABAN 20 MG PO TABS: 20.0000 mg | ORAL_TABLET | Freq: Every day | ORAL | 0 refills | Status: DC

## 2017-11-28 MED ORDER — RIVAROXABAN 15 MG PO TABS
15.0000 mg | ORAL_TABLET | Freq: Two times a day (BID) | ORAL | Status: DC
  Administered 2017-11-28: 13:00:00 15 mg via ORAL
  Filled 2017-11-28 (×2): qty 1

## 2017-11-28 NOTE — Progress Notes (Signed)
[facility] for heparin Indication: pulmonary embolus  Allergies  Allergen Reactions  . Nsaids Other (See Comments)    Can take NSAIDs short term but cannot be on it long term d/t renal insuff  . Sulfa Antibiotics Other (See Comments)    Reaction:  GI upset     Patient Measurements: Height: 5\' 11"  (180.3 cm) Weight: 131 lb 9.6 oz (59.7 kg) IBW/kg (Calculated) : 75.3 Heparin Dosing Weight: 57.2 kg  Vital Signs: Temp: 99.3 F (37.4 C) (12/19 0435) Temp Source: Oral (12/19 0435) BP: 101/60 (12/19 0435) Pulse Rate: 108 (12/19 0435)  Labs: Recent Labs    11/26/17 2104 11/27/17 0114  11/27/17 1915 11/28/17 0107 11/28/17 0759  HGB 14.1  --   --   --  13.2  --   HCT 43.0  --   --   --  38.9*  --   PLT 163  --   --   --  143*  --   APTT  --  >160*  --   --   --   --   LABPROT  --  15.6*  --   --   --   --   INR  --  1.25  --   --   --   --   HEPARINUNFRC  --  0.58   < > 0.32 0.30 0.32  CREATININE 0.66  --   --  0.55*  --   --   TROPONINI <0.03  --   --   --   --   --    < > = values in this interval not displayed.    Estimated Creatinine Clearance: 77.7 mL/min (A) (by C-G formula based on SCr of 0.55 mg/dL (L)).   Medical History: Past Medical History:  Diagnosis Date  . Arthritis    Rheumatoid arthritis  . Cancer ([place])    lung ca   . Collagen vascular disease (HCC)    RA  . Dyspnea   . Hepatitis    b and c  . Mass of lung   . Pneumonia   . Pneumothorax    spontaneous  . Renal disorder    as a child    Assessment: Patient has active metastatic hepatic cancer, on CT shows disease progression and evidence of left-sided PE. Patient also was prescribed lovenox 60 mg subq as an outpatient for VTE treatment for possible intrahepatic clot, but on med rec states patient was not taking. Patient is now being started on heparin drip for newly developed left-sided PE on CT  Goal of Therapy:  Heparin level 0.3-0.7  units/ml Monitor platelets by anticoagulation protocol: Yes   Plan:  HL remains therapeutic but at the low end of goal range. Considering patient has PE, will again increase heparin infusion to 1200 units/hr and recheck a HL in 6 hours.   Napoleon Form, PharmD, BCPS Clinical Pharmacist 11/28/17 9:48 AM

## 2017-11-28 NOTE — Discharge Summary (Signed)
[facility] NAME: Gerald [name]    MR#:  [id number]  DATE OF BIRTH:  November 04, 1951  DATE OF ADMISSION:  11/27/2017 ADMITTING PHYSICIAN: Harrie Foreman, MD  DATE OF DISCHARGE: 11/28/2017  PRIMARY CARE PHYSICIAN: Patient, No Pcp Per    ADMISSION DIAGNOSIS:  Shortness of breath [R06.02] Thrombus [I82.90] PE (pulmonary thromboembolism) ([place]) [I26.99]  DISCHARGE DIAGNOSIS:  Active Problems:   Pulmonary emboli (HCC)   SECONDARY DIAGNOSIS:   Past Medical History:  Diagnosis Date  . Arthritis    Rheumatoid arthritis  . Cancer ([place])    lung ca   . Collagen vascular disease (HCC)    RA  . Dyspnea   . Hepatitis    b and c  . Mass of lung   . Pneumonia   . Pneumothorax    spontaneous  . Renal disorder    as a child    HOSPITAL COURSE:  66 year old male with history of hepatocellular carcinoma and lung cancer who presented shortness of breath and found to have large pulmonary blood.   1. Pulmonary emboli: Patient was started on heparin drip. He was evaluated by vascular surgery. Vascular surgery is not recommending thrombo-lysis given that at the present time patient is not requiring supplemental oxygen along with very large tumor burden and overall metastatic disease he is at high risk for thrombolytic therapy. I spoke with the oncologist on call. Patient does not want to be on Lovenox injections as he could not tolerate this. He would like to be discharged on XARELTO and has a one year coupon for this.  2. Metastatic cancer: CT of the chest and abdomen show progression of metastatic disease. Patient will follow up with oncology as an outpatient.  3. Hyponatremia: This is due to underlying lung malignancy.  4. Elevated LFTs: This is due to hepatocellular carcinoma  5. Colitis: I did not feel the patient actually had colitis. He had no symptoms of colitis. Patient does not need antibiotics at the time of discharge.  DISCHARGE  CONDITIONS AND DIET:   Stable  Regular diet  CONSULTS OBTAINED:  Treatment Team:  Delana Meyer Dolores Lory, MD Earlie Server, MD  DRUG ALLERGIES:   Allergies  Allergen Reactions  . Nsaids Other (See Comments)    Can take NSAIDs short term but cannot be on it long term d/t renal insuff  . Sulfa Antibiotics Other (See Comments)    Reaction:  GI upset     DISCHARGE MEDICATIONS:   Allergies as of 11/28/2017      Reactions   Nsaids Other (See Comments)   Can take NSAIDs short term but cannot be on it long term d/t renal insuff   Sulfa Antibiotics Other (See Comments)   Reaction:  GI upset       Medication List    STOP taking these medications   enoxaparin 60 MG/0.6ML injection Commonly known as:  LOVENOX     TAKE these medications   calcium-vitamin D 500-200 MG-UNIT tablet Take 1 tablet by mouth 2 (two) times daily.   lenvatinib 8 mg daily dose 4 (2) MG capsule Commonly known as:  LENVIMA Take 2 capsules (8 mg total) by mouth daily.   ondansetron 4 MG tablet Commonly known as:  ZOFRAN Take 1 tablet (4 mg total) every 8 (eight) hours as needed by mouth for nausea or vomiting.   oxyCODONE 5 MG immediate release tablet Commonly known as:  Oxy IR/ROXICODONE Take 1-2 tablets every 4 hours  as needed for pain   protein supplement shake Liqd Commonly known as:  PREMIER PROTEIN Take 325 mLs (11 oz total) by mouth 2 (two) times daily between meals.   Rivaroxaban 15 MG Tabs tablet Commonly known as:  XARELTO Take 1 tablet (15 mg total) by mouth 2 (two) times daily for 21 days.   rivaroxaban 20 MG Tabs tablet Commonly known as:  XARELTO Take 1 tablet (20 mg total) by mouth daily with supper. Start taking on:  12/19/2017         Today   CHIEF COMPLAINT:  Doing well this am no SOB Ready for discharge.   VITAL SIGNS:  Blood pressure 101/60, pulse (!) 108, temperature 99.3 F (37.4 C), temperature source Oral, resp. rate 20, height 5\' 11"  (1.803 m), weight 59.7 kg (131  lb 9.6 oz), SpO2 94 %.   REVIEW OF SYSTEMS:  Review of Systems  Constitutional: Negative.  Negative for chills, fever and malaise/fatigue.  HENT: Negative.  Negative for ear discharge, ear pain, hearing loss, nosebleeds and sore throat.   Eyes: Negative.  Negative for blurred vision and pain.  Respiratory: Negative.  Negative for cough, hemoptysis, shortness of breath and wheezing.   Cardiovascular: Negative.  Negative for chest pain, palpitations and leg swelling.  Gastrointestinal: Negative.  Negative for abdominal pain, blood in stool, diarrhea, nausea and vomiting.  Genitourinary: Negative.  Negative for dysuria.  Musculoskeletal: Negative.  Negative for back pain.  Skin: Negative.   Neurological: Negative for dizziness, tremors, speech change, focal weakness, seizures and headaches.  Endo/Heme/Allergies: Negative.  Does not bruise/bleed easily.  Psychiatric/Behavioral: Negative.  Negative for depression, hallucinations and suicidal ideas.     PHYSICAL EXAMINATION:  GENERAL:  66 y.o.-year-old patient lying in the bed with no acute distress.  NECK:  Supple, no jugular venous distention. No thyroid enlargement, no tenderness.  LUNGS: Normal breath sounds bilaterally, no wheezing, rales,rhonchi  No use of accessory muscles of respiration.  CARDIOVASCULAR: S1, S2 normal. No murmurs, rubs, or gallops.  ABDOMEN: Soft, non-tender, non-distended. Bowel sounds present. No organomegaly or mass.  EXTREMITIES: No pedal edema, cyanosis, or clubbing.  PSYCHIATRIC: The patient is alert and oriented x 3.  SKIN: No obvious rash, lesion, or ulcer.   DATA REVIEW:   CBC Recent Labs  Lab 11/28/17 0107  WBC 8.2  HGB 13.2  HCT 38.9*  PLT 143*    Chemistries  Recent Labs  Lab 11/27/17 1915  NA 132*  K 3.9  CL 103  CO2 25  GLUCOSE 121*  BUN 11  CREATININE 0.55*  CALCIUM 7.8*  MG 1.5*  AST 105*  ALT 50  ALKPHOS 188*  BILITOT 4.1*    Cardiac Enzymes Recent Labs  Lab  11/26/17 2104  TROPONINI <0.03    Microbiology Results  @[email or web address] @  RADIOLOGY:  Ct Chest W Contrast  Result Date: 11/26/2017 CLINICAL DATA:  Hepatocellular carcinoma. Stage IV adenocarcinoma of the lung. EXAM: CT CHEST, ABDOMEN, AND PELVIS WITH CONTRAST TECHNIQUE: Multidetector CT imaging of the chest, abdomen and pelvis was performed following the standard protocol during bolus administration of intravenous contrast. CONTRAST:  132mL ISOVUE-300 IOPAMIDOL (ISOVUE-300) INJECTION 61% COMPARISON:  09/17/2017 PET. Chest abdomen and pelvic CTs of 07/02/2017. FINDINGS: CT CHEST FINDINGS Cardiovascular: Tortuous thoracic aorta. Aortic atherosclerosis. Normal heart size, without pericardial effusion. Multivessel coronary artery atherosclerosis. Moderate volume pulmonary embolism to the left lower lobe, identified within segmental and subsegmental branches including on image 35/series 2. No findings of right heart strain. Mediastinum/Nodes: Small left supraclavicular  nodes are similar. No mediastinal or hilar adenopathy. Lungs/Pleura: Small left and trace right pleural effusion. The left-sided effusion is increased and right-sided effusion is new since the prior diagnostic CT. Secretions in dependent trachea. Moderate centrilobular emphysema. Development of basilar predominant bilateral pulmonary nodules since the prior diagnostic CT. Example 6 mm right lower lobe pulmonary nodule on image 90/series 4. The left lower lobe pulmonary nodule measures 9 mm on image 121/series 4. Medial right upper lobe and right perihilar radiation induced consolidation with traction bronchiectasis. Similar in morphology and distribution back to the prior diagnostic CT. Musculoskeletal: No acute osseous abnormality. CT ABDOMEN PELVIS FINDINGS Hepatobiliary: New high right hepatic lobe low-density lesion at 7 mm on image 48/series 2. Index higha lateral segment left liver lobe lesion measures on the order of 2.0 cm on image  53/series 2 versus 1.5 cm on the prior diagnostic CT. New or progressive confluent lateral segment left liver lobe lesion measures on the order of 3.6 x 2.9 cm on image 58/series 2. This was likely present on 09/17/2017 PET. And new caudate lobe lesion since the prior diagnostic CT measures 2.9 x 2.7 cm on image 55/series 2. Grossly normal gallbladder, common duct dilatation. There is developing intrahepatic duct dilatation within the high left liver lobe, including on image 53/series 2. This is likely due to obstruction by central tumor. Pancreas: Normal, without mass or ductal dilatation. Spleen: Large splenule. Adrenals/Urinary Tract: Normal adrenal glands. Upper pole left renal scarring medially. Normal right kidney and urinary bladder. Stomach/Bowel: Varices about the gastroesophageal junction. Right-sided colonic wall thickening is moderate, including on image 88/series 2. Normal small bowel caliber. Vascular/Lymphatic: Advanced aortic and branch vessel atherosclerosis. Since the prior diagnostic CT, progression of portal vein thrombus. This is grossly similar to on the prior PET. Presumed tumor thrombus within portal vein branches. This alters perfusion and makes evaluation of the extent of hepatic metastasis difficult. Thrombus also continues into the extrahepatic portal vein, including on image 66/series 2. No extension into the splenic vein or superior mesenteric veins. Tumor thrombus is also identified within the left hepatic vein, including on image 47/series 2. This is progressive since the prior diagnostic CT. This continues into the intra cardiac IVC, including on image 44/series 2. Gastrohepatic ligament node measures 8 mm on image 57/series 2 and is similar. Not pathologic by size criteria. No pelvic sidewall adenopathy. Reproductive: Normal prostate. Other: New small volume abdominopelvic ascites. Musculoskeletal: No acute osseous abnormality. IMPRESSION: CT CHEST IMPRESSION 1. Development of  bilateral pulmonary nodules, consistent with metastatic disease. 2. Moderate volume left-sided pulmonary emboli. 3. Similar radiation change within the medial right lung. 4. Increased tiny left and new trace right pleural effusion. CT ABDOMEN AND PELVIS IMPRESSION 1. Progression of hepatic metastasis, especially when compared to the prior diagnostic CT of 07/02/2017. Direct comparison to the PET is difficult secondary to cross modality. 2. Progression of portal and hepatic vein thrombus with IVC thrombus continuing into the intracardiac IVC. 3. Right-sided colonic wall thickening is most likely due to portal venous hypertension and portal vein thrombus. Concurrent infectious colitis cannot be excluded. 4. New small volume abdominopelvic ascites. 5. Developing high left hepatic lobe intrahepatic biliary duct dilatation. Recommend attention on follow-up. These results will be called to the ordering clinician or representative by the Radiologist Assistant, and communication documented in the PACS or zVision Dashboard. Electronically Signed   By: Abigail Miyamoto M.D.   On: 11/26/2017 16:36   Ct Abdomen Pelvis W Contrast  Result Date: 11/26/2017 CLINICAL  DATA:  Hepatocellular carcinoma. Stage IV adenocarcinoma of the lung. EXAM: CT CHEST, ABDOMEN, AND PELVIS WITH CONTRAST TECHNIQUE: Multidetector CT imaging of the chest, abdomen and pelvis was performed following the standard protocol during bolus administration of intravenous contrast. CONTRAST:  153mL ISOVUE-300 IOPAMIDOL (ISOVUE-300) INJECTION 61% COMPARISON:  09/17/2017 PET. Chest abdomen and pelvic CTs of 07/02/2017. FINDINGS: CT CHEST FINDINGS Cardiovascular: Tortuous thoracic aorta. Aortic atherosclerosis. Normal heart size, without pericardial effusion. Multivessel coronary artery atherosclerosis. Moderate volume pulmonary embolism to the left lower lobe, identified within segmental and subsegmental branches including on image 35/series 2. No findings of right  heart strain. Mediastinum/Nodes: Small left supraclavicular nodes are similar. No mediastinal or hilar adenopathy. Lungs/Pleura: Small left and trace right pleural effusion. The left-sided effusion is increased and right-sided effusion is new since the prior diagnostic CT. Secretions in dependent trachea. Moderate centrilobular emphysema. Development of basilar predominant bilateral pulmonary nodules since the prior diagnostic CT. Example 6 mm right lower lobe pulmonary nodule on image 90/series 4. The left lower lobe pulmonary nodule measures 9 mm on image 121/series 4. Medial right upper lobe and right perihilar radiation induced consolidation with traction bronchiectasis. Similar in morphology and distribution back to the prior diagnostic CT. Musculoskeletal: No acute osseous abnormality. CT ABDOMEN PELVIS FINDINGS Hepatobiliary: New high right hepatic lobe low-density lesion at 7 mm on image 48/series 2. Index higha lateral segment left liver lobe lesion measures on the order of 2.0 cm on image 53/series 2 versus 1.5 cm on the prior diagnostic CT. New or progressive confluent lateral segment left liver lobe lesion measures on the order of 3.6 x 2.9 cm on image 58/series 2. This was likely present on 09/17/2017 PET. And new caudate lobe lesion since the prior diagnostic CT measures 2.9 x 2.7 cm on image 55/series 2. Grossly normal gallbladder, common duct dilatation. There is developing intrahepatic duct dilatation within the high left liver lobe, including on image 53/series 2. This is likely due to obstruction by central tumor. Pancreas: Normal, without mass or ductal dilatation. Spleen: Large splenule. Adrenals/Urinary Tract: Normal adrenal glands. Upper pole left renal scarring medially. Normal right kidney and urinary bladder. Stomach/Bowel: Varices about the gastroesophageal junction. Right-sided colonic wall thickening is moderate, including on image 88/series 2. Normal small bowel caliber.  Vascular/Lymphatic: Advanced aortic and branch vessel atherosclerosis. Since the prior diagnostic CT, progression of portal vein thrombus. This is grossly similar to on the prior PET. Presumed tumor thrombus within portal vein branches. This alters perfusion and makes evaluation of the extent of hepatic metastasis difficult. Thrombus also continues into the extrahepatic portal vein, including on image 66/series 2. No extension into the splenic vein or superior mesenteric veins. Tumor thrombus is also identified within the left hepatic vein, including on image 47/series 2. This is progressive since the prior diagnostic CT. This continues into the intra cardiac IVC, including on image 44/series 2. Gastrohepatic ligament node measures 8 mm on image 57/series 2 and is similar. Not pathologic by size criteria. No pelvic sidewall adenopathy. Reproductive: Normal prostate. Other: New small volume abdominopelvic ascites. Musculoskeletal: No acute osseous abnormality. IMPRESSION: CT CHEST IMPRESSION 1. Development of bilateral pulmonary nodules, consistent with metastatic disease. 2. Moderate volume left-sided pulmonary emboli. 3. Similar radiation change within the medial right lung. 4. Increased tiny left and new trace right pleural effusion. CT ABDOMEN AND PELVIS IMPRESSION 1. Progression of hepatic metastasis, especially when compared to the prior diagnostic CT of 07/02/2017. Direct comparison to the PET is difficult secondary to cross modality.  2. Progression of portal and hepatic vein thrombus with IVC thrombus continuing into the intracardiac IVC. 3. Right-sided colonic wall thickening is most likely due to portal venous hypertension and portal vein thrombus. Concurrent infectious colitis cannot be excluded. 4. New small volume abdominopelvic ascites. 5. Developing high left hepatic lobe intrahepatic biliary duct dilatation. Recommend attention on follow-up. These results will be called to the ordering clinician or  representative by the Radiologist Assistant, and communication documented in the PACS or zVision Dashboard. Electronically Signed   By: Abigail Miyamoto M.D.   On: 11/26/2017 16:36      Allergies as of 11/28/2017      Reactions   Nsaids Other (See Comments)   Can take NSAIDs short term but cannot be on it long term d/t renal insuff   Sulfa Antibiotics Other (See Comments)   Reaction:  GI upset       Medication List    STOP taking these medications   enoxaparin 60 MG/0.6ML injection Commonly known as:  LOVENOX     TAKE these medications   calcium-vitamin D 500-200 MG-UNIT tablet Take 1 tablet by mouth 2 (two) times daily.   lenvatinib 8 mg daily dose 4 (2) MG capsule Commonly known as:  LENVIMA Take 2 capsules (8 mg total) by mouth daily.   ondansetron 4 MG tablet Commonly known as:  ZOFRAN Take 1 tablet (4 mg total) every 8 (eight) hours as needed by mouth for nausea or vomiting.   oxyCODONE 5 MG immediate release tablet Commonly known as:  Oxy IR/ROXICODONE Take 1-2 tablets every 4 hours as needed for pain   protein supplement shake Liqd Commonly known as:  PREMIER PROTEIN Take 325 mLs (11 oz total) by mouth 2 (two) times daily between meals.   Rivaroxaban 15 MG Tabs tablet Commonly known as:  XARELTO Take 1 tablet (15 mg total) by mouth 2 (two) times daily for 21 days.   rivaroxaban 20 MG Tabs tablet Commonly known as:  XARELTO Take 1 tablet (20 mg total) by mouth daily with supper. Start taking on:  12/19/2017         Management plans discussed with the patient and he is in agreement. Stable for discharge home  Patient should follow up with pcp    CODE STATUS:     Code Status Orders  (From admission, onward)        Start     Ordered   11/27/17 0857  Full code  Continuous     11/27/17 0856    Code Status History    Date Active Date Inactive Code Status Order ID Comments User Context   08/20/2017 16:00 08/22/2017 19:07 Full Code [id number]  Aletta Edouard, MD Inpatient   03/27/2016 20:43 03/31/2016 18:06 Full Code [id number]  Gladstone Lighter, MD ED      TOTAL TIME TAKING CARE OF THIS PATIENT: 38 minutes.    Note: This dictation was prepared with Dragon dictation along with smaller phrase technology. Any transcriptional errors that result from this process are unintentional.  Krystle Oberman M.D on 11/28/2017 at 12:20 PM  Between 7am to 6pm - Pager - [phone] After 6pm go to [email or web address] - password [email or web address] [facility]  Office  [phone]  CC: Primary care physician; Patient, No Pcp Per

## 2017-11-28 NOTE — Progress Notes (Signed)
[place] for heparin Indication: pulmonary embolus  Allergies  Allergen Reactions  . Nsaids Other (See Comments)    Can take NSAIDs short term but cannot be on it long term d/t renal insuff  . Sulfa Antibiotics Other (See Comments)    Reaction:  GI upset     Patient Measurements: Height: 5\' 11"  (180.3 cm) Weight: 129 lb 12.8 oz (58.9 kg) IBW/kg (Calculated) : 75.3 Heparin Dosing Weight: 57.2 kg  Vital Signs: Temp: 98.4 F (36.9 C) (12/18 2002) Temp Source: Oral (12/18 2002) BP: 119/71 (12/18 2002) Pulse Rate: 60 (12/18 2002)  Labs: Recent Labs    11/26/17 2104  11/27/17 0114 11/27/17 1110 11/27/17 1915 11/28/17 0107  HGB 14.1  --   --   --   --  13.2  HCT 43.0  --   --   --   --  38.9*  PLT 163  --   --   --   --  143*  APTT  --   --  >160*  --   --   --   LABPROT  --   --  15.6*  --   --   --   INR  --   --  1.25  --   --   --   HEPARINUNFRC  --    < > 0.58 0.31 0.32 0.30  CREATININE 0.66  --   --   --  0.55*  --   TROPONINI <0.03  --   --   --   --   --    < > = values in this interval not displayed.    Estimated Creatinine Clearance: 76.7 mL/min (A) (by C-G formula based on SCr of 0.55 mg/dL (L)).   Medical History: Past Medical History:  Diagnosis Date  . Arthritis    Rheumatoid arthritis  . Cancer ([place])    lung ca   . Collagen vascular disease ([facility])    RA  . Dyspnea   . Hepatitis    b and c  . Mass of lung   . Pneumonia   . Pneumothorax    spontaneous  . Renal disorder    as a child    Assessment: Patient has active metastatic hepatic cancer, on CT shows disease progression and evidence of left-sided PE. Patient also was prescribed lovenox 60 mg subq as an outpatient for VTE treatment for possible intrahepatic clot, but on med rec states patient was not taking. Patient is now being started on heparin drip for newly developed left-sided PE on CT  Goal of Therapy:  Heparin level 0.3-0.7  units/ml Monitor platelets by anticoagulation protocol: Yes   Plan:  HL = 0.32 is therapeutic. Will continue heparin infusion at current rate of 1100 units/hr and order confirmatory HL in 6 hours. CBC ordered with AM labs tomorrow.  12/19 @ 0100 HL 0.30 therapeutic, but trending down and considering patient has a PE will increase rate to 1150 units/hr and will recheck HL @ 0700. Hgb down by one unit.  Tobie Lords, PharmD, BCPS Clinical Pharmacist 11/28/17 1:43 AM

## 2017-11-28 NOTE — Telephone Encounter (Signed)
Multiple attempts to reach patient as patient was discharged on Xalreto instead of Eliquis which was preferred and discussed with hospitalist team. Voice message left to patient's phone with my call back number.  Patient did not answer. Patient has appointment tomorrow at cancer center. Discussed with Dr.Corcoran's NP [place] and he will discuss with patient and change the anticoagulation.

## 2017-11-28 NOTE — Consult Note (Signed)
Hematology/Oncology Consult note [facility] [phone] [phone]  Patient Care Team: Patient, No Pcp Per as PCP - General ([place]) Noreene Filbert, MD as Referring Physician (Radiation Oncology) Vilinda Boehringer, MD (Inactive) as Consulting Physician (Internal Medicine)   Name of the patient: Gerald [name]  [id number]  July 23, 1951   Date of visit: 11/28/17 REASON FOR COSULTATION:  Anticoagulation. History of presenting illness- Patient is a 66 year old male with stage IV lung adenocarcinoma and hepatocellular carcinoma on Lenvatinib was currently admitted due to newly diagnosed PE and progression of hepatic/IVC thrombus. Patient was not taking Lovenox. Patient had restaging CAT scan done which showed above abnormalities. Patient was called and was suggested to go to the emergency room and got admitted. Patient was started on heparin drip. No intervention per vascular surgery. CT chest abdomen also showed progression of metastasis. Patient reports that he cannot tolerate Lovenox and does not want to continue on therapeutic dose of Lovenox. He has some pain across his abdomen which is well controlled by pain regimen. He has good appetite. Denies any shortness of breath.   Review of systems- Review of Systems  Constitutional: Positive for malaise/fatigue. Negative for fever.  HENT: Negative for hearing loss.   Eyes: Negative for blurred vision.  Respiratory: Negative for cough.   Cardiovascular: Negative for chest pain.  Gastrointestinal: Positive for abdominal pain. Negative for heartburn.  Genitourinary: Negative for dysuria.  Musculoskeletal: Negative for myalgias.  Skin: Negative for rash.  Neurological: Negative for dizziness.  Endo/Heme/Allergies: Does not bruise/bleed easily.  Psychiatric/Behavioral: Negative for depression.    Allergies  Allergen Reactions  . Nsaids Other (See Comments)    Can take NSAIDs short term but  cannot be on it long term d/t renal insuff  . Sulfa Antibiotics Other (See Comments)    Reaction:  GI upset     Patient Active Problem List   Diagnosis Date Noted  . Pulmonary emboli ([place]) 11/27/2017  . Hypomagnesemia 09/20/2017  . Hepatocellular carcinoma ([place]) 08/20/2017  . Increased liver enzymes 07/07/2017  . Elevated AFP 07/07/2017  . Portal vein thrombosis 07/07/2017  . Hepatitis B infection without delta agent without hepatic coma 07/03/2017  . Hepatitis C virus infection without hepatic coma 07/03/2017  . Hepatic vein thrombosis ([place]) 04/14/2017  . Goals of care, counseling/discussion 03/27/2017  . Encounter for antineoplastic immunotherapy 01/02/2017  . Liver metastasis ([place]) 12/10/2016  . Dyspnea 07/11/2016  . Pneumonia 07/11/2016  . Lung cancer ([place]) 07/11/2016  . Right Hilar mass 07/11/2016  . Aspiration pneumonia ([place]) 06/08/2016  . Cancer associated pain 04/17/2016  . Cerebral infarction, unspecified ([place]) 04/09/2016  . Peripheral neuropathy 04/03/2016  . Bone metastasis ([place]) 03/31/2016  . Primary cancer of right upper lobe of lung ([place]) 03/30/2016  . Protein-calorie malnutrition, severe 03/28/2016  . Lymphoproliferative disease ([place])   . Cough   . Atrial mass   . Eosinophilia 03/27/2016     Past Medical History:  Diagnosis Date  . Arthritis    Rheumatoid arthritis  . Cancer ([place])    lung ca   . Collagen vascular disease (H[facility])    RA  . Dyspnea   . Hepatitis    b and c  . Mass of lung   . Pneumonia   . Pneumothorax    spontaneous  . Renal disorder    as a child     Past Surgical History:  Procedure Laterality Date  . FLEXIBLE BRONCHOSCOPY N/A 03/30/2016   Procedure: FLEXIBLE BRONCHOSCOPY;  Surgeon: Roxanne Mins  Mungal, MD;  Location: [facility] ORS;  Service: Cardiopulmonary;  Laterality: N/A;  . IR ANGIOGRAM SELECTIVE EACH ADDITIONAL VESSEL  07/24/2017  . IR ANGIOGRAM SELECTIVE EACH ADDITIONAL VESSEL  07/24/2017  . IR ANGIOGRAM SELECTIVE EACH ADDITIONAL  VESSEL  07/24/2017  . IR ANGIOGRAM SELECTIVE EACH ADDITIONAL VESSEL  07/24/2017  . IR ANGIOGRAM SELECTIVE EACH ADDITIONAL VESSEL  07/24/2017  . IR ANGIOGRAM SELECTIVE EACH ADDITIONAL VESSEL  07/24/2017  . IR ANGIOGRAM SELECTIVE EACH ADDITIONAL VESSEL  07/24/2017  . IR ANGIOGRAM SELECTIVE EACH ADDITIONAL VESSEL  08/20/2017  . IR ANGIOGRAM VISCERAL SELECTIVE  07/24/2017  . IR ANGIOGRAM VISCERAL SELECTIVE  08/20/2017  . IR EMBO ARTERIAL NOT HEMORR HEMANG INC GUIDE ROADMAPPING  07/24/2017  . IR EMBO TUMOR ORGAN ISCHEMIA INFARCT INC GUIDE ROADMAPPING  08/20/2017  . IR RADIOLOGIST EVAL & MGMT  07/11/2017  . IR US GUIDE VASC ACCESS RIGHT  07/24/2017  . Ureter repair as a child      Social History   Socioeconomic History  . Marital status: Legally Separated    Spouse name: Not on file  . Number of children: Not on file  . Years of education: Not on file  . Highest education level: Not on file  Social Needs  . Financial resource strain: Not on file  . Food insecurity - worry: Not on file  . Food insecurity - inability: Not on file  . Transportation needs - medical: Not on file  . Transportation needs - non-medical: Not on file  Occupational History  . Not on file  Tobacco Use  . Smoking status: Former Smoker    Packs/day: 0.50    Years: 26.00    Pack years: 13.00    Types: Cigarettes    Last attempt to quit: 06/08/2000    Years since quitting: 17.4  . Smokeless tobacco: Never Used  . Tobacco comment: Quit about 5 years ago  Substance and Sexual Activity  . Alcohol use: Yes    Alcohol/week: 0.0 oz    Comment: occasionally beer  . Drug use: No  . Sexual activity: Not on file  Other Topics Concern  . Not on file  Social History Narrative   Lives at home with son.     Family History  Problem Relation Age of Onset  . Brain cancer Mother   . Lung cancer Maternal Uncle   . Cancer Sister        Metastatic cancer- unknown primary     Current Facility-Administered Medications:  .  0.9 %   sodium chloride infusion, , Intravenous, Continuous, Harrie Foreman, MD, Last Rate: 125 mL/hr at 11/28/17 0705 .  acetaminophen (TYLENOL) tablet 650 mg, 650 mg, Oral, Q6H PRN **OR** acetaminophen (TYLENOL) suppository 650 mg, 650 mg, Rectal, Q6H PRN, Harrie Foreman, MD .  calcium-vitamin D (OSCAL WITH D) 500-200 MG-UNIT per tablet 1 tablet, 1 tablet, Oral, BID, Harrie Foreman, MD, 1 tablet at 11/28/17 0850 .  ciprofloxacin (CIPRO) tablet 500 mg, 500 mg, Oral, BID, Gouru, Aruna, MD, 500 mg at 11/28/17 0850 .  docusate sodium (COLACE) capsule 100 mg, 100 mg, Oral, BID, Harrie Foreman, MD, 100 mg at 11/28/17 0850 .  feeding supplement (BOOST / RESOURCE BREEZE) liquid 1 Container, 1 Container, Oral, BID WC, Gouru, Aruna, MD .  Influenza vac split quadrivalent PF (FLUZONE HIGH-DOSE) injection 0.5 mL, 0.5 mL, Intramuscular, Tomorrow-1000, Gouru, Aruna, MD .  lenvatinib 8 mg daily dose (LENVIMA) capsule 8 mg, 8 mg, Oral, Daily, Harrie Foreman, MD,  Stopped at 11/28/17 0851 .  metroNIDAZOLE (FLAGYL) tablet 500 mg, 500 mg, Oral, Q8H, Gouru, Aruna, MD, 500 mg at 11/28/17 0601 .  multivitamin with minerals tablet 1 tablet, 1 tablet, Oral, Daily, Gouru, Aruna, MD, 1 tablet at 11/28/17 0850 .  ondansetron (ZOFRAN) tablet 4 mg, 4 mg, Oral, Q6H PRN **OR** ondansetron (ZOFRAN) injection 4 mg, 4 mg, Intravenous, Q6H PRN, Harrie Foreman, MD .  oxyCODONE (Oxy IR/ROXICODONE) immediate release tablet 10 mg, 10 mg, Oral, Q6H PRN, Harrie Foreman, MD, 10 mg at 11/28/17 0601 .  pneumococcal 23 valent vaccine (PNU-IMMUNE) injection 0.5 mL, 0.5 mL, Intramuscular, Tomorrow-1000, Gouru, Aruna, MD .  protein supplement (PREMIER PROTEIN) liquid, 11 oz, Oral, BID BM, Gouru, Aruna, MD, 11 oz at 11/27/17 2050 .  Rivaroxaban (XARELTO) tablet 15 mg, 15 mg, Oral, BID **FOLLOWED BY** [START ON 12/19/2017] rivaroxaban (XARELTO) tablet 20 mg, 20 mg, Oral, Q supper, Bettey Costa, MD   Physical exam:  Vitals:    11/27/17 1314 11/27/17 1316 11/27/17 2002 11/28/17 0435  BP: (!) 97/59 (!) 103/56 119/71 101/60  Pulse: (!) 109 (!) 109 60 (!) 108  Resp: 18  18 20   Temp: 98.5 F (36.9 C)  98.4 F (36.9 C) 99.3 F (37.4 C)  TempSrc: Oral  Oral Oral  SpO2: 95% 95% 98% 94%  Weight:    131 lb 9.6 oz (59.7 kg)  Height:       GENERAL: Not in acute distress, cachectic MENTAL STATUS:  Alert and oriented to person, place and time.  HEAD:Temporal wasting. Normocephalic, atraumatic, face symmetric,  HEENTOropharynx clear without lesion. Tonguenormal.  RESPIRATORY:Clear to auscultationwithout rales, wheezes or rhonchi. CARDIOVASCULAR:Regular rate andrhythmwithout murmur, rub or gallop. ABDOMEN:Soft, slightly tender epigastric  No guarding or rebound tenderness.  SKIN: Dry. No rashes. EXTREMITIES: No edema, no skin discoloration or tenderness. No palpable cords. LYMPHNODES: No palpable cervical, supraclavicular, axillary or inguinal adenopathy  NEUROLOGICAL: Unremarkable. PSYCH: Appropriate.      CMP Latest Ref Rng & Units 11/27/2017  Glucose 65 - 99 mg/dL 121(H)  BUN 6 - 20 mg/dL 11  Creatinine 0.61 - 1.24 mg/dL 0.55(L)  Sodium 135 - 145 mmol/L 132(L)  Potassium 3.5 - 5.1 mmol/L 3.9  Chloride 101 - 111 mmol/L 103  CO2 22 - 32 mmol/L 25  Calcium 8.9 - 10.3 mg/dL 7.8(L)  Total Protein 6.5 - 8.1 g/dL 7.2  Total Bilirubin 0.3 - 1.2 mg/dL 4.1(H)  Alkaline Phos 38 - 126 U/L 188(H)  AST 15 - 41 U/L 105(H)  ALT 17 - 63 U/L 50   CBC Latest Ref Rng & Units 11/28/2017  WBC 3.8 - 10.6 K/uL 8.2  Hemoglobin 13.0 - 18.0 g/dL 13.2  Hematocrit 40.0 - 52.0 % 38.9(L)  Platelets 150 - 440 K/uL 143(L)    Ct Chest W Contrast  Result Date: 11/26/2017 CLINICAL DATA:  Hepatocellular carcinoma. Stage IV adenocarcinoma of the lung. EXAM: CT CHEST, ABDOMEN, AND PELVIS WITH CONTRAST TECHNIQUE: Multidetector CT imaging of the chest, abdomen and pelvis was performed following the standard protocol  during bolus administration of intravenous contrast. CONTRAST:  11mL ISOVUE-300 IOPAMIDOL (ISOVUE-300) INJECTION 61% COMPARISON:  09/17/2017 PET. Chest abdomen and pelvic CTs of 07/02/2017. FINDINGS: CT CHEST FINDINGS Cardiovascular: Tortuous thoracic aorta. Aortic atherosclerosis. Normal heart size, without pericardial effusion. Multivessel coronary artery atherosclerosis. Moderate volume pulmonary embolism to the left lower lobe, identified within segmental and subsegmental branches including on image 35/series 2. No findings of right heart strain. Mediastinum/Nodes: Small left supraclavicular nodes are similar. No  mediastinal or hilar adenopathy. Lungs/Pleura: Small left and trace right pleural effusion. The left-sided effusion is increased and right-sided effusion is new since the prior diagnostic CT. Secretions in dependent trachea. Moderate centrilobular emphysema. Development of basilar predominant bilateral pulmonary nodules since the prior diagnostic CT. Example 6 mm right lower lobe pulmonary nodule on image 90/series 4. The left lower lobe pulmonary nodule measures 9 mm on image 121/series 4. Medial right upper lobe and right perihilar radiation induced consolidation with traction bronchiectasis. Similar in morphology and distribution back to the prior diagnostic CT. Musculoskeletal: No acute osseous abnormality. CT ABDOMEN PELVIS FINDINGS Hepatobiliary: New high right hepatic lobe low-density lesion at 7 mm on image 48/series 2. Index higha lateral segment left liver lobe lesion measures on the order of 2.0 cm on image 53/series 2 versus 1.5 cm on the prior diagnostic CT. New or progressive confluent lateral segment left liver lobe lesion measures on the order of 3.6 x 2.9 cm on image 58/series 2. This was likely present on 09/17/2017 PET. And new caudate lobe lesion since the prior diagnostic CT measures 2.9 x 2.7 cm on image 55/series 2. Grossly normal gallbladder, common duct dilatation. There is  developing intrahepatic duct dilatation within the high left liver lobe, including on image 53/series 2. This is likely due to obstruction by central tumor. Pancreas: Normal, without mass or ductal dilatation. Spleen: Large splenule. Adrenals/Urinary Tract: Normal adrenal glands. Upper pole left renal scarring medially. Normal right kidney and urinary bladder. Stomach/Bowel: Varices about the gastroesophageal junction. Right-sided colonic wall thickening is moderate, including on image 88/series 2. Normal small bowel caliber. Vascular/Lymphatic: Advanced aortic and branch vessel atherosclerosis. Since the prior diagnostic CT, progression of portal vein thrombus. This is grossly similar to on the prior PET. Presumed tumor thrombus within portal vein branches. This alters perfusion and makes evaluation of the extent of hepatic metastasis difficult. Thrombus also continues into the extrahepatic portal vein, including on image 66/series 2. No extension into the splenic vein or superior mesenteric veins. Tumor thrombus is also identified within the left hepatic vein, including on image 47/series 2. This is progressive since the prior diagnostic CT. This continues into the intra cardiac IVC, including on image 44/series 2. Gastrohepatic ligament node measures 8 mm on image 57/series 2 and is similar. Not pathologic by size criteria. No pelvic sidewall adenopathy. Reproductive: Normal prostate. Other: New small volume abdominopelvic ascites. Musculoskeletal: No acute osseous abnormality. IMPRESSION: CT CHEST IMPRESSION 1. Development of bilateral pulmonary nodules, consistent with metastatic disease. 2. Moderate volume left-sided pulmonary emboli. 3. Similar radiation change within the medial right lung. 4. Increased tiny left and new trace right pleural effusion. CT ABDOMEN AND PELVIS IMPRESSION 1. Progression of hepatic metastasis, especially when compared to the prior diagnostic CT of 07/02/2017. Direct comparison to  the PET is difficult secondary to cross modality. 2. Progression of portal and hepatic vein thrombus with IVC thrombus continuing into the intracardiac IVC. 3. Right-sided colonic wall thickening is most likely due to portal venous hypertension and portal vein thrombus. Concurrent infectious colitis cannot be excluded. 4. New small volume abdominopelvic ascites. 5. Developing high left hepatic lobe intrahepatic biliary duct dilatation. Recommend attention on follow-up. These results will be called to the ordering clinician or representative by the Radiologist Assistant, and communication documented in the PACS or zVision Dashboard. Electronically Signed   By: Abigail Miyamoto M.D.   On: 11/26/2017 16:36   Ct Abdomen Pelvis W Contrast  Result Date: 11/26/2017 CLINICAL DATA:  Hepatocellular carcinoma.  Stage IV adenocarcinoma of the lung. EXAM: CT CHEST, ABDOMEN, AND PELVIS WITH CONTRAST TECHNIQUE: Multidetector CT imaging of the chest, abdomen and pelvis was performed following the standard protocol during bolus administration of intravenous contrast. CONTRAST:  175mL ISOVUE-300 IOPAMIDOL (ISOVUE-300) INJECTION 61% COMPARISON:  09/17/2017 PET. Chest abdomen and pelvic CTs of 07/02/2017. FINDINGS: CT CHEST FINDINGS Cardiovascular: Tortuous thoracic aorta. Aortic atherosclerosis. Normal heart size, without pericardial effusion. Multivessel coronary artery atherosclerosis. Moderate volume pulmonary embolism to the left lower lobe, identified within segmental and subsegmental branches including on image 35/series 2. No findings of right heart strain. Mediastinum/Nodes: Small left supraclavicular nodes are similar. No mediastinal or hilar adenopathy. Lungs/Pleura: Small left and trace right pleural effusion. The left-sided effusion is increased and right-sided effusion is new since the prior diagnostic CT. Secretions in dependent trachea. Moderate centrilobular emphysema. Development of basilar predominant bilateral  pulmonary nodules since the prior diagnostic CT. Example 6 mm right lower lobe pulmonary nodule on image 90/series 4. The left lower lobe pulmonary nodule measures 9 mm on image 121/series 4. Medial right upper lobe and right perihilar radiation induced consolidation with traction bronchiectasis. Similar in morphology and distribution back to the prior diagnostic CT. Musculoskeletal: No acute osseous abnormality. CT ABDOMEN PELVIS FINDINGS Hepatobiliary: New high right hepatic lobe low-density lesion at 7 mm on image 48/series 2. Index higha lateral segment left liver lobe lesion measures on the order of 2.0 cm on image 53/series 2 versus 1.5 cm on the prior diagnostic CT. New or progressive confluent lateral segment left liver lobe lesion measures on the order of 3.6 x 2.9 cm on image 58/series 2. This was likely present on 09/17/2017 PET. And new caudate lobe lesion since the prior diagnostic CT measures 2.9 x 2.7 cm on image 55/series 2. Grossly normal gallbladder, common duct dilatation. There is developing intrahepatic duct dilatation within the high left liver lobe, including on image 53/series 2. This is likely due to obstruction by central tumor. Pancreas: Normal, without mass or ductal dilatation. Spleen: Large splenule. Adrenals/Urinary Tract: Normal adrenal glands. Upper pole left renal scarring medially. Normal right kidney and urinary bladder. Stomach/Bowel: Varices about the gastroesophageal junction. Right-sided colonic wall thickening is moderate, including on image 88/series 2. Normal small bowel caliber. Vascular/Lymphatic: Advanced aortic and branch vessel atherosclerosis. Since the prior diagnostic CT, progression of portal vein thrombus. This is grossly similar to on the prior PET. Presumed tumor thrombus within portal vein branches. This alters perfusion and makes evaluation of the extent of hepatic metastasis difficult. Thrombus also continues into the extrahepatic portal vein, including on  image 66/series 2. No extension into the splenic vein or superior mesenteric veins. Tumor thrombus is also identified within the left hepatic vein, including on image 47/series 2. This is progressive since the prior diagnostic CT. This continues into the intra cardiac IVC, including on image 44/series 2. Gastrohepatic ligament node measures 8 mm on image 57/series 2 and is similar. Not pathologic by size criteria. No pelvic sidewall adenopathy. Reproductive: Normal prostate. Other: New small volume abdominopelvic ascites. Musculoskeletal: No acute osseous abnormality. IMPRESSION: CT CHEST IMPRESSION 1. Development of bilateral pulmonary nodules, consistent with metastatic disease. 2. Moderate volume left-sided pulmonary emboli. 3. Similar radiation change within the medial right lung. 4. Increased tiny left and new trace right pleural effusion. CT ABDOMEN AND PELVIS IMPRESSION 1. Progression of hepatic metastasis, especially when compared to the prior diagnostic CT of 07/02/2017. Direct comparison to the PET is difficult secondary to cross modality. 2. Progression of portal  and hepatic vein thrombus with IVC thrombus continuing into the intracardiac IVC. 3. Right-sided colonic wall thickening is most likely due to portal venous hypertension and portal vein thrombus. Concurrent infectious colitis cannot be excluded. 4. New small volume abdominopelvic ascites. 5. Developing high left hepatic lobe intrahepatic biliary duct dilatation. Recommend attention on follow-up. These results will be called to the ordering clinician or representative by the Radiologist Assistant, and communication documented in the PACS or zVision Dashboard. Electronically Signed   By: Abigail Miyamoto M.D.   On: 11/26/2017 16:36    Assessment and plan- Patient is a 66 y.o. male with stage IV lung adenocarcinoma and hepatocellular carcinoma on Lenvatinib (held due to bilirubinemia) newly diagnosed PE and the progression of known hepatic with IVC  thrombosis which now extended to intracardiac IVC.   #Anticoagulation :As patient refuses Lovenox, we'll recommend starting Eliquis as alternative. Discussed with Dr.Mody.  # Disease progression: Patient needs follow up with Dr.Corcoran regarding further management plan.  Thank you for this kind referral and the opportunity to participate in the care of this patient  Dr. Earlie Server, MD, PhD [facility] Pager- [phone] 11/28/2017

## 2017-11-28 NOTE — Discharge Instructions (Signed)
Information on my medicine - XARELTO (rivaroxaban)  This medication education was reviewed with me or my healthcare representative as part of my discharge preparation.  The pharmacist that spoke with me during my hospital stay was:  Napoleon Form, [place]? Xarelto was prescribed to treat blood clots that may have been found in the veins of your legs (deep vein thrombosis) or in your lungs (pulmonary embolism) and to reduce the risk of them occurring again.  What do you need to know about Xarelto? The starting dose is one 15 mg tablet taken TWICE daily with food for the FIRST 21 DAYS then on (enter date)  12/19/17  the dose is changed to one 20 mg tablet taken ONCE A DAY with your evening meal.  DO NOT stop taking Xarelto without talking to the health care provider who prescribed the medication.  Refill your prescription for 20 mg tablets before you run out.  After discharge, you should have regular check-up appointments with your healthcare provider that is prescribing your Xarelto.  In the future your dose may need to be changed if your kidney function changes by a significant amount.  What do you do if you miss a dose? If you are taking Xarelto TWICE DAILY and you miss a dose, take it as soon as you remember. You may take two 15 mg tablets (total 30 mg) at the same time then resume your regularly scheduled 15 mg twice daily the next day.  If you are taking Xarelto ONCE DAILY and you miss a dose, take it as soon as you remember on the same day then continue your regularly scheduled once daily regimen the next day. Do not take two doses of Xarelto at the same time.   Important Safety Information Xarelto is a blood thinner medicine that can cause bleeding. You should call your healthcare provider right away if you experience any of the following: ? Bleeding from an injury or your nose that does not stop. ? Unusual colored urine (red or dark brown) or  unusual colored stools (red or black). ? Unusual bruising for unknown reasons. ? A serious fall or if you hit your head (even if there is no bleeding).  Some medicines may interact with Xarelto and might increase your risk of bleeding while on Xarelto. To help avoid this, consult your healthcare provider or pharmacist prior to using any new prescription or non-prescription medications, including herbals, vitamins, non-steroidal anti-inflammatory drugs (NSAIDs) and supplements.  This website has more information on Xarelto: [email or web address]

## 2017-11-28 NOTE — Plan of Care (Signed)
  Education: Knowledge of General Education information will improve 11/28/2017 0546 - Progressing by Jeffie Pollock, RN   Health Behavior/Discharge Planning: Ability to manage health-related needs will improve 11/28/2017 0546 - Progressing by Jeffie Pollock, RN   Clinical Measurements: Ability to maintain clinical measurements within normal limits will improve 11/28/2017 0546 - Progressing by Jeffie Pollock, RN Will remain free from infection 11/28/2017 0546 - Progressing by Jeffie Pollock, RN Diagnostic test results will improve 11/28/2017 0546 - Progressing by Jeffie Pollock, RN Respiratory complications will improve 11/28/2017 0546 - Progressing by Jeffie Pollock, RN Cardiovascular complication will be avoided 11/28/2017 0546 - Progressing by Jeffie Pollock, RN   Activity: Risk for activity intolerance will decrease 11/28/2017 0546 - Progressing by Jeffie Pollock, RN   Nutrition: Adequate nutrition will be maintained 11/28/2017 0546 - Progressing by Jeffie Pollock, RN   Coping: Level of anxiety will decrease 11/28/2017 0546 - Progressing by Jeffie Pollock, RN   Elimination: Will not experience complications related to bowel motility 11/28/2017 0546 - Progressing by Jeffie Pollock, RN Will not experience complications related to urinary retention 11/28/2017 0546 - Progressing by Jeffie Pollock, RN

## 2017-11-28 NOTE — Progress Notes (Signed)
Discharge instructions given and went over with patient at bedside. Prescriptions given and reviewed. All questions answered, patient verbalized understanding. Patient discharged home. Madlyn Frankel, RN

## 2017-11-29 ENCOUNTER — Telehealth: Payer: Self-pay | Admitting: Urgent Care

## 2017-11-29 ENCOUNTER — Inpatient Hospital Stay: Payer: Medicare Other

## 2017-11-29 ENCOUNTER — Inpatient Hospital Stay: Payer: Medicare Other | Admitting: Urgent Care

## 2017-11-29 NOTE — Telephone Encounter (Signed)
Attempted to call patient a couple of times today to follow up with him. He missed today's scheduled appointment. There was no answer; LMOM. Dr. Tasia Catchings has also called him to further discuss his anticoagulation. She left her cell phone number, however the patient has not returned her call. I will have Rodena Piety, RN try to touch base with him tomorrow to discuss anticoagulation, and need for follow up here in the medical oncology clinic.

## 2017-11-29 NOTE — Progress Notes (Deleted)
[facility] day:  11/29/2017   Chief Complaint: Gerald [name] is a 66 y.o. male with stage IV adenocarcinoma of the lung and hepatocellular carcinoma on lenvatinib who is seen for 1 week assessment, hospital follow up, and to review interval imaging.  HPI:  The patient was last seen in the medical oncology clinic on 11/23/2017.  At that time, patient was doing "ok". He continued to complain of significant fatigue. Weight continued to decline; patient down 2 pounds since last visit. Patient continued on Lenvatinib, however he was not taking it as prescribed. He denied B symptoms and recent infections. WBC 7.6 with an [place] of 5200. Hemoglobin 13.7, hematocrit 41.3, and platelets 170,000. LFTs continue to be elevated; AST 121, ALT 60, alkaline phosphatase 201, and total bilirubin 3.8. Magnesium low at 1.6. AFP continues to be elevated at 1457.0 (previously 1188.0). Lenvatinib held and patient sent for interval restaging imaging.   CT scans of the chest, abdomen, and pelvis with contrast were done on 11/26/2017. Results revealed the following:  1. Development of bilateral pulmonary nodules that are consistent with metastatic disease.   2. Left-sided pulmonary emboli.   3. Progression of patient's hepatic metastasis:    a. New 7 mm right hepatic lobe lesion.    b. The lesion in the lateral segment of the left liver lobe has increased in size from 1.5 cm to 2.0 cm.    c. New, or possibly progressive, confluent left lateral segment left liver lobe lesion measuring 3.6 x 2.9 cm.    d. New caudate lobe lesion, measuring 2.9 x 2.7 cm.     e. Developing intrahepatic ductal dilatation within the left liver lobe felt to be secondary to central tumor obstruction.  4. Progression of portal vein thrombus that extends into the extrahepatic portal vein. Tumor thrombus also identified within the left hepatic vein, extending into the intracardiac IVC.   5. Gastrohepatic  ligament node measuring 8 mm.  6. Right-sided colonic wall thickening that most likely represents sequela related to portal venous hypertension and portal vein thrombosis. Concurrent infectious colitis could not be excluded.  7. New small volume abdominopelvic ascites.   Past Medical History:  Diagnosis Date  . Arthritis    Rheumatoid arthritis  . Cancer ([place])    lung ca   . Collagen vascular disease (HCC)    RA  . Dyspnea   . Hepatitis    b and c  . Mass of lung   . Pneumonia   . Pneumothorax    spontaneous  . Renal disorder    as a child    Past Surgical History:  Procedure Laterality Date  . FLEXIBLE BRONCHOSCOPY N/A 03/30/2016   Procedure: FLEXIBLE BRONCHOSCOPY;  Surgeon: Vilinda Boehringer, MD;  Location: [facility] ORS;  Service: Cardiopulmonary;  Laterality: N/A;  . IR ANGIOGRAM SELECTIVE EACH ADDITIONAL VESSEL  07/24/2017  . IR ANGIOGRAM SELECTIVE EACH ADDITIONAL VESSEL  07/24/2017  . IR ANGIOGRAM SELECTIVE EACH ADDITIONAL VESSEL  07/24/2017  . IR ANGIOGRAM SELECTIVE EACH ADDITIONAL VESSEL  07/24/2017  . IR ANGIOGRAM SELECTIVE EACH ADDITIONAL VESSEL  07/24/2017  . IR ANGIOGRAM SELECTIVE EACH ADDITIONAL VESSEL  07/24/2017  . IR ANGIOGRAM SELECTIVE EACH ADDITIONAL VESSEL  07/24/2017  . IR ANGIOGRAM SELECTIVE EACH ADDITIONAL VESSEL  08/20/2017  . IR ANGIOGRAM VISCERAL SELECTIVE  07/24/2017  . IR ANGIOGRAM VISCERAL SELECTIVE  08/20/2017  . IR EMBO ARTERIAL NOT HEMORR HEMANG INC GUIDE ROADMAPPING  07/24/2017  . IR EMBO TUMOR  ORGAN ISCHEMIA INFARCT INC GUIDE ROADMAPPING  08/20/2017  . IR RADIOLOGIST EVAL & MGMT  07/11/2017  . IR US GUIDE VASC ACCESS RIGHT  07/24/2017  . Ureter repair as a child      Family History  Problem Relation Age of Onset  . Brain cancer Mother   . Lung cancer Maternal Uncle   . Cancer Sister        Metastatic cancer- unknown primary    Social History:  reports that he quit smoking about 17 years ago. His smoking use included cigarettes. He has a 13.00 pack-year  smoking history. he has never used smokeless tobacco. He reports that he drinks alcohol. He reports that he does not use drugs.  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  He lives in [place].  Contact number is [phone].  He has a great pyrenees.  The patient is alone today.  Allergies:  Allergies  Allergen Reactions  . Nsaids Other (See Comments)    Can take NSAIDs short term but cannot be on it long term d/t renal insuff  . Sulfa Antibiotics Other (See Comments)    Reaction:  GI upset     Current Medications: Current Outpatient Medications  Medication Sig Dispense Refill  . Calcium Carb-Cholecalciferol (CALCIUM-VITAMIN D) 500-200 MG-UNIT tablet Take 1 tablet by mouth 2 (two) times daily.    Marland Kitchen lenvatinib 8 mg daily dose (LENVIMA) 4 (2) MG capsule Take 2 capsules (8 mg total) by mouth daily. 60 capsule 0  . ondansetron (ZOFRAN) 4 MG tablet Take 1 tablet (4 mg total) every 8 (eight) hours as needed by mouth for nausea or vomiting. 30 tablet 0  . oxyCODONE (OXY IR/ROXICODONE) 5 MG immediate release tablet Take 1-2 tablets every 4 hours as needed for pain 40 tablet 0  . protein supplement shake (PREMIER PROTEIN) LIQD Take 325 mLs (11 oz total) by mouth 2 (two) times daily between meals. 19500 mL 1  . Rivaroxaban (XARELTO) 15 MG TABS tablet Take 1 tablet (15 mg total) by mouth 2 (two) times daily for 21 days. 42 tablet 0  . [START ON 12/19/2017] rivaroxaban (XARELTO) 20 MG TABS tablet Take 1 tablet (20 mg total) by mouth daily with supper. 30 tablet 0   No current facility-administered medications for this visit.     Review of Systems:  GENERAL:  Feels "ok".  Fatigued.  No fever, chills or sweats.  Weight down 2 pounds. PERFORMANCE STATUS (ECOG):  2 HEENT: No visual changes, sore throat, mouth sores or tenderness. Lungs:  Chronic shortness of breath.  No cough. No hemoptysis. Cardiac:  No chest pain, palpitations, orthopnea, or PND.   Sleeps in a recliner. GI:  Appetite poor. (+) nausea. No vomiting, diarrhea, constipation, melena or hematochezia. GU:  No urgency, frequency, dysuria, or hematuria. Musculoskeletal:  Chronic right knee pain. No back pain.  No muscle tenderness. Extremities:  No pain or swelling. Skin:  No rashes or ulcers. Neuro:  Neuropathy (stable) on gabapentin.  No headache, focal weakness, balance or coordination issues. Endocrine:  No diabetes, thyroid issues, hot flashes or night sweats. Psych:  No mood changes, depression or anxiety. Pain:  No pain today.  Review of systems:  All other systems reviewed and found to be negative.  Physical Exam: There were no vitals taken for this visit. GENERAL:  Thin fatigued appearing gentleman sitting comfortably in the exam room in no acute distress. MENTAL STATUS:  Alert  and oriented to person, place and time.  HEAD:Wearing a cap. Short Saraphina Lauderbaugh hair. Albertina Parr. Temporal wasting. Normocephalic, atraumatic, face symmetric, no Cushingoid features. EYES:Blue eyes. Pupils equal round and reactive to light and accomodation. No conjunctivitis or scleral icterus. JXB:JYNWGNFAOZ clear without lesion. Tonguenormal. Mucous membranes dry. RESPIRATORY:Clear to auscultationwithout rales, wheezes or rhonchi. CARDIOVASCULAR:Regular rate andrhythmwithout murmur, rub or gallop. ABDOMEN:Soft, slightly tender over liver.  No guarding or rebound tenderness.  Active bowel sounds and no splenomegaly. No masses. SKIN: Dry.  No rashes. EXTREMITIES: No edema, no skin discoloration or tenderness. No palpable cords. LYMPHNODES: No palpable cervical, supraclavicular, axillary or inguinal adenopathy  NEUROLOGICAL: Unremarkable. PSYCH: Appropriate.    Imaging studies: 03/27/2016:  Chest CT angiogram revealed an 8 cm mass in the medial right upper lobe. The mass abutted the mediastinum and possibly invaded the left atrium. There was associated widespread  thoracic adenopathy. There was a 2.6 cm enhancing lesion in the medial segment of the left hepatic lobe. 04/05/2016:  PET scan revealed intense hypermetabolism (SUV 17.95) associated with a 7.5 cm central perihilar right lung lesion. There was mediastinal (sub-carinal node SUV 8.89; 2.6 cm right paratracheal node SUV 17.49) and upper abdominal retroperitoneal hypermetabolic adenopathy (9 mm aortocaval node SUV 9.2). There were 2 liver metastasis (2.3 cm left lobe SUV 6.19; 2.2 cm segment VIII SUV 5.18). There are multifocal bone metastasis (C2 vertebral body SUV 9.0; 1.6 cm left sacral ala lesion SUV of 8.6).  03/31/2016:  Head MRI was indeterminate for early metastatic disease to the brain. There was a small focus of gyral enhancement in the anterior right frontal lobe likely is post ischemic, while a small posterior right cerebellar enhancing lesion was more suspicious for small brain metastasis. There were multiple small primarily subacute appearing infarcts in the bilateral MCA and left PICA territories.  04/07/2016:  Bone scan revealed a subtle distal right femur lesion which might be a small metastasis.  Bone scan was felt to be an unreliable modality for evaluation of osseous metastatic disease as the bone metastases seen on the recent PET and MRI were not evident. 05/12/2016:  Head MRI revealed expected interval evolution of bilateral cerebral in cerebellar infarcts. There was resolution of right frontal and right cerebellar enhancement consistent with subacute infarcts. There was a new 3 mm focus of gyral enhancement in the left occipital lobe and a possible new 4 mm enhancing lesion in the left cerebellum (? vascular enhancement versus tiny metastasis).  08/16/2016:  Head MRI revealed multiple areas of improving enhancement and restricted diffusion compatible with resolving subacute infarcts.  There was no evidence of metastatic disease or acute infarction  10/19/2016:  Chest, abdomen, and  pelvic CT scan revealed radiation changes in the right lung.  The superimposed right lung pneumonia had improved.  The 6.4 x 2.4 cm right perihilar mass had decreased.  There was improving thoracic lymphadenopathy.  There was progression of hepatic metastases, measuring up to 4.7 cm.  Specifically, there was a 3.9 x 3.7 cm metastasis in segment 8 (previously 2.5 x 2.8 cm) and a 4.7 x 4.0 cm metastasis in segment 4B (previously 3.1 x 2.6 cm). 11/14/2016:  PET scan revealed 2 liver masses. Both were significantly more hypermetabolic than on 30/86/5784 and have enlarged compared to the prior PET-CT.  The segment 8 lesion was centrally necrotic but with high activity along its periphery (7.4), and about the same size as it was on 10/19/2016. The lateral segment left hepatic lobe lesion had enlarged (5.9 x 5.3  cm) compared to 10/19/16 (4.1 x 4.4 cm).  The bony metastatic lesions were no longer hypermetabolic and have resolved.  There were post therapy and postoperative findings in the right lung, with a considerable amount of suspected radiation pneumonitis and radiation fibrosis. 12/29/2016:  Chest, abdomen, and pelvic CT revealed mild reduction in size and central necrosis of the hepatic metastatic lesions.  There was continued post therapy related findings along the right hilar region. Dominant right perihilar mass was reduced in thickness compared to 10/19/2016.  04/13/2017:  Chest, abdomen, and pelvic CT revealed new left hepatic vein thrombosis. This somewhat obscured the known left hepatic lobe tumor although overall the left hepatic lobe masses were thought to be similar in size compared the prior exam. The segment 8 lesion was less well seen than on the prior exam.  The central necrotic portion was seen but the peripheral rim was much less conspicuous (could represent improvement in this tumor).  There was mild increase in adenopathy in the gastrohepatic ligament.  There was essentially stable appearance in  the lungs with considerable radiation pneumonitis, scarring, and volume loss in the right lung, along with a trace loculated pleural effusion with enhancing margins.  There was stable appearance of prior splenic infarcts and prior scarring in the left mid kidney. 07/02/2017:  Chest, abdomen, and pelvic CT revealed interval increase in the left hepatic vein thrombosis with progressive heterogeneity and enhancement of the left hepatic lobe, predominantly obscuring the known left hepatic lobe lesion. There was a possible new 1.5 cm lateral left hepatic lobe lesion versus focal area of heterogeneity. There was slight interval decrease in the size of the irregular low-attenuation lesion within segment 8 of the liver. Right lung was stable in appearance with interval development of consolidation within the lingula representing atelectasis or infection. 07/05/2017:  Abdominal MRI revealed persistent mass within lateral segment of left lobe of liver with progressive thrombosis of the left portal vein. There was also persistent thrombosis of the left hepatic vein. Portal vein thrombus extended up to the level of the main portal vein and was likely tumor thrombus. The liver has morphologic features suggestive of early cirrhosis. All findings are highly suggestive that the lesion in the left lobe of liver represents hepatoma rather than lung cancer metastasis. The lesion within segment 8 of the liver had decreased in size when compared with 04/13/2017.  The small lesion within segment 7 of the liver appeared new from previous exam. Indeterminate. This may represent an area of lung cancer metastasis or possibly multifocal [place] 09/17/2017:  PET scan revealed the original 2 lesions shown on the prior exam are less cohesivly visible, there were new spreading confluent lesions in segments 1, 2, 3, 4, 5, and 8 of the liver compatible with progressive multifocal hepatic malignancy. There was also hypermetabolic tumor thrombus in  the portal vein along the porta hepatis.  There was post therapy related findings in the lungs without active pulmonary or osseous metastatic disease identified.   Admission on 11/27/2017, Discharged on 11/28/2017  Component Date Value Ref Range Status  . Troponin I 11/26/2017 <0.03  <0.03 ng/mL Final  . WBC 11/26/2017 6.7  3.8 - 10.6 K/uL Final  . RBC 11/26/2017 4.36* 4.40 - 5.90 MIL/uL Final  . Hemoglobin 11/26/2017 14.1  13.0 - 18.0 g/dL Final  . HCT 11/26/2017 43.0  40.0 - 52.0 % Final  . MCV 11/26/2017 98.6  80.0 - 100.0 fL Final  . MCH 11/26/2017 32.3  26.0 - 34.0 pg  Final  . MCHC 11/26/2017 32.7  32.0 - 36.0 g/dL Final  . RDW 11/26/2017 16.5* 11.5 - 14.5 % Final  . Platelets 11/26/2017 163  150 - 440 K/uL Final  . Neutrophils Relative % 11/26/2017 74  % Final  . Neutro Abs 11/26/2017 4.9  1.4 - 6.5 K/uL Final  . Lymphocytes Relative 11/26/2017 11  % Final  . Lymphs Abs 11/26/2017 0.7* 1.0 - 3.6 K/uL Final  . Monocytes Relative 11/26/2017 12  % Final  . Monocytes Absolute 11/26/2017 0.8  0.2 - 1.0 K/uL Final  . Eosinophils Relative 11/26/2017 2  % Final  . Eosinophils Absolute 11/26/2017 0.1  0 - 0.7 K/uL Final  . Basophils Relative 11/26/2017 1  % Final  . Basophils Absolute 11/26/2017 0.1  0 - 0.1 K/uL Final  . Sodium 11/26/2017 134* 135 - 145 mmol/L Final  . Potassium 11/26/2017 4.5  3.5 - 5.1 mmol/L Final  . Chloride 11/26/2017 101  101 - 111 mmol/L Final  . CO2 11/26/2017 25  22 - 32 mmol/L Final  . Glucose, Bld 11/26/2017 132* 65 - 99 mg/dL Final  . BUN 11/26/2017 12  6 - 20 mg/dL Final  . Creatinine, Ser 11/26/2017 0.66  0.61 - 1.24 mg/dL Final  . Calcium 11/26/2017 8.3* 8.9 - 10.3 mg/dL Final  . Total Protein 11/26/2017 8.5* 6.5 - 8.1 g/dL Final  . Albumin 11/26/2017 2.8* 3.5 - 5.0 g/dL Final  . AST 11/26/2017 128* 15 - 41 U/L Final  . ALT 11/26/2017 63  17 - 63 U/L Final  . Alkaline Phosphatase 11/26/2017 230* 38 - 126 U/L Final  . Total Bilirubin 11/26/2017 3.5*  0.3 - 1.2 mg/dL Final  . GFR calc non Af Amer 11/26/2017 >60  >60 mL/min Final  . GFR calc Af Amer 11/26/2017 >60  >60 mL/min Final   Comment: (NOTE) The eGFR has been calculated using the CKD EPI equation. This calculation has not been validated in all clinical situations. eGFR's persistently <60 mL/min signify possible Chronic Kidney Disease.   . Anion gap 11/26/2017 8  5 - 15 Final  . aPTT 11/27/2017 >160* 24 - 36 seconds Final   Comment:        IF BASELINE aPTT IS ELEVATED, SUGGEST PATIENT RISK ASSESSMENT BE USED TO DETERMINE APPROPRIATE ANTICOAGULANT THERAPY. CRITICAL RESULT CALLED TO, READ BACK BY AND VERIFIED WITH: ALLISON PATE AT [id number] 11/27/17 ALV   . Prothrombin Time 11/27/2017 15.6* 11.4 - 15.2 seconds Final  . INR 11/27/2017 1.25   Final  . Heparin Unfractionated 11/27/2017 0.58  0.30 - 0.70 IU/mL Final   Comment:        IF HEPARIN RESULTS ARE BELOW EXPECTED VALUES, AND PATIENT DOSAGE HAS BEEN CONFIRMED, SUGGEST FOLLOW UP TESTING OF ANTITHROMBIN III LEVELS.   . TSH 11/27/2017 4.224  0.350 - 4.500 uIU/mL Final   Performed by a 3rd Generation assay with a functional sensitivity of <=0.01 uIU/mL.  . Hgb A1c MFr Bld 11/27/2017 5.1  4.8 - 5.6 % Final   Comment: (NOTE)         Prediabetes: 5.7 - 6.4         Diabetes: >6.4         Glycemic control for adults with diabetes: <7.0   . Mean Plasma Glucose 11/27/2017 100  mg/dL Final   Comment: (NOTE) Performed At: [facility] [place], [place] [id number] Rush Farmer MD SA:[id number]   . Heparin Unfractionated 11/27/2017 0.31  0.30 - 0.70 IU/mL Final  Comment:        IF HEPARIN RESULTS ARE BELOW EXPECTED VALUES, AND PATIENT DOSAGE HAS BEEN CONFIRMED, SUGGEST FOLLOW UP TESTING OF ANTITHROMBIN III LEVELS.   Marland Kitchen Heparin Unfractionated 11/27/2017 0.32  0.30 - 0.70 IU/mL Final   Comment:        IF HEPARIN RESULTS ARE BELOW EXPECTED VALUES, AND PATIENT DOSAGE HAS BEEN CONFIRMED, SUGGEST FOLLOW  UP TESTING OF ANTITHROMBIN III LEVELS.   Marland Kitchen Sodium 11/27/2017 132* 135 - 145 mmol/L Final  . Potassium 11/27/2017 3.9  3.5 - 5.1 mmol/L Final  . Chloride 11/27/2017 103  101 - 111 mmol/L Final  . CO2 11/27/2017 25  22 - 32 mmol/L Final  . Glucose, Bld 11/27/2017 121* 65 - 99 mg/dL Final  . BUN 11/27/2017 11  6 - 20 mg/dL Final  . Creatinine, Ser 11/27/2017 0.55* 0.61 - 1.24 mg/dL Final  . Calcium 11/27/2017 7.8* 8.9 - 10.3 mg/dL Final  . Total Protein 11/27/2017 7.2  6.5 - 8.1 g/dL Final  . Albumin 11/27/2017 2.3* 3.5 - 5.0 g/dL Final  . AST 11/27/2017 105* 15 - 41 U/L Final  . ALT 11/27/2017 50  17 - 63 U/L Final  . Alkaline Phosphatase 11/27/2017 188* 38 - 126 U/L Final  . Total Bilirubin 11/27/2017 4.1* 0.3 - 1.2 mg/dL Final  . GFR calc non Af Amer 11/27/2017 >60  >60 mL/min Final  . GFR calc Af Amer 11/27/2017 >60  >60 mL/min Final   Comment: (NOTE) The eGFR has been calculated using the CKD EPI equation. This calculation has not been validated in all clinical situations. eGFR's persistently <60 mL/min signify possible Chronic Kidney Disease.   . Anion gap 11/27/2017 4* 5 - 15 Final  . WBC 11/28/2017 8.2  3.8 - 10.6 K/uL Final  . RBC 11/28/2017 3.99* 4.40 - 5.90 MIL/uL Final  . Hemoglobin 11/28/2017 13.2  13.0 - 18.0 g/dL Final  . HCT 11/28/2017 38.9* 40.0 - 52.0 % Final  . MCV 11/28/2017 97.6  80.0 - 100.0 fL Final  . MCH 11/28/2017 33.1  26.0 - 34.0 pg Final  . MCHC 11/28/2017 33.9  32.0 - 36.0 g/dL Final  . RDW 11/28/2017 16.5* 11.5 - 14.5 % Final  . Platelets 11/28/2017 143* 150 - 440 K/uL Final  . Magnesium 11/27/2017 1.5* 1.7 - 2.4 mg/dL Final  . Heparin Unfractionated 11/28/2017 0.30  0.30 - 0.70 IU/mL Final   Comment:        IF HEPARIN RESULTS ARE BELOW EXPECTED VALUES, AND PATIENT DOSAGE HAS BEEN CONFIRMED, SUGGEST FOLLOW UP TESTING OF ANTITHROMBIN III LEVELS.   Marland Kitchen Heparin Unfractionated 11/28/2017 0.32  0.30 - 0.70 IU/mL Final   Comment:        IF HEPARIN  RESULTS ARE BELOW EXPECTED VALUES, AND PATIENT DOSAGE HAS BEEN CONFIRMED, SUGGEST FOLLOW UP TESTING OF ANTITHROMBIN III LEVELS.     Assessment:  Caidyn Henricksen is a 66 y.o. male with stage IV adenocarcinoma of the lung and associated leukocytosis with eosinophilia. He presented with a 30 pound weight loss, shortness of breath, cough, and left leg discomfort. He has a 13-15 pack year smoking history.  Bronchoscopy on 03/30/2016 revealed an endobronchial lesion in the RUL anterior segment with 95% occlusion. There was extrinsic compression in the right middle lobe secondary to posterior mass effect. Pathology confirmed non-small cell lung cancer, favor adenocarcinoma.  PD-L1 testing revealed high expression (> 50%).  EGFR, ALK, and ROS1 were negative.  CEA was 2.3 on 03/27/2016.  PET scan  on 04/05/2016 revealed intense hypermetabolism (SUV 17.95) associated with a 7.5 cm central perihilar right lung lesion. There was mediastinal (sub-carinal node SUV 8.89; 2.6 cm right paratracheal node SUV 17.49) and upper abdominal retroperitoneal hypermetabolic adenopathy (9 mm aortocaval node SUV 9.2). There were 2 liver metastasis (2.3 cm left lobe SUV 6.19; 2.2 cm segment VIII SUV 5.18). There are multifocal bone metastasis (C2 vertebral body SUV 9.0; 1.6 cm left sacral ala lesion SUV of 8.6).   He had marked leukocytosis with hypereosinophilia. Initial WBC was 72,300 with 53% eosinophils. Bone marrow on 03/29/2016 revealed marked increase in marrow eosinophils (approximate 30%). There was no diagnostic morphologic evidence of a myeloproliferative or lymphoid neoplasm. Flow cytometry revealed significant increase in eosinophils (54%). There was relative decreased myeloid cells with no significant immunophenotypic abnormalities or increase in blasts. There was no lymphoid abnormalities or evidence of clonality. FISH studies were negative for myeloproliferative neoplasms with eosinophilia (PDGFRA, PDGFRB,  FGFR1). BCR-ABL was negative on 03/28/2016.  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  Left lower extremity duplex on 03/25/2016 revealed no evidence of DVT. Discomfort improved on Neurontin 300 mg a day and continues to improve with declining eosinophilia.  He received 41.4 Gy from 04/11/2016 - 05/23/2016.  He received 4 weeks of concurrent carboplatin and Taxol (04/17/2016 - 05/22/2016).    He has received 15 cycles of Keytruda (07/03/2016 - 10/12/2016; 11/22/2016 - 06/05/2017).  He has tolerated treatment well.  He receives [place] every 6 weeks (began 05/22/2016; last 06/26/2017).  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  He was on Xarelto from 04/13/2017 - 07/24/2017. Lovenox injections were not started on 07/13/2017 as ordered; ordered again to start 07/25/2017.   He has increased liver function tests.  Etiology is possibly secondary to acute hepatitis, irritation due to thrombosis, or progressive disease (metastatic liver lesion or [place]).  He is hepatitis B core antibody and hepatitis C antibody positive.  Hepatitis C RNA was 5.474 log10 IU/ml (298,000 IU/ml) on 07/03/2017.  Hepatitis C genotype is 1a.  AFP was 589.7 on 07/03/2017.  Chest, abdomen, and pelvic CT on 07/02/2017 revealed interval increase in the left hepatic vein thrombosis with progressive heterogeneity and enhancement of the left hepatic lobe, predominantly obscuring the known left hepatic lobe lesion. There was a possible new 1.5 cm lateral left hepatic lobe lesion versus focal area of heterogeneity. There was slight interval decrease in the size of the irregular low-attenuation lesion within segment 8 of the liver. Right lung was stable in appearance with interval development of consolidation within the lingula representing atelectasis or infection.  Abdominal MRI on 07/05/2017 revealed persistent mass within lateral segment of left lobe of  liver with progressive thrombosis of the left portal vein. There was also persistent thrombosis of the left hepatic vein. Portal vein thrombus extended up to the level of the main portal vein and was likely tumor thrombus. The liver has morphologic features suggestive of early cirrhosis. All findings are highly suggestive that the lesion in the left lobe of liver represents hepatoma rather than lung cancer metastasis. The lesion within segment 8 of the liver had decreased in size when compared with 04/13/2017.  The small lesion within segment 7 of the liver appeared new from previous exam. Indeterminate. This may represent an area of lung cancer metastasis or possibly multifocal HCC.  Ultrasound guided liver biopsy on 07/18/2017 revealed carcinoma c/w hepatocellular carcinoma.  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  He underwent left accessory hepatic arteriography with chemotherapy embolization of accessory hepatic artery with doxorubicin-eluting beads on 08/20/2017. Tumor was targeted in the lateral segment of the left lobe of the liver.  AFP has been followed: 589.7 on 07/03/2017, 819.2 on 08/20/2017, 475.6 on 09/07/2017, 1188 on 10/29/2017, and 1457 on 11/23/2017.  PET scan on 09/17/2017 revealed the original 2 lesions shown on the prior exam are less cohesivly visible, there were new spreading confluent lesions in segments 1, 2, 3, 4, 5, and 8 of the liver compatible with progressive multifocal hepatic malignancy. 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  There was post therapy related findings in the lungs without active pulmonary or osseous metastatic disease identified.  He began lenvatinib on 10/16/2017.  He takes 3 days in a row then waits a few days and continues.  Code status is DNR/DNI.  Symptomatically, he is fatigued.  He has lost 2 pounds since  his last visit. WBC is 7600 with an [place] of 5200. Hemoglobin 13.7, hematocrit 41.3,  platelets 170,000. Total bilirubin is elevated at 3.8   Plan: 1.  Labs today:  CBC with diff, CMP, Mg, AFP. 2.  Hold Lenvatinib due to elevation of total bilirubin to 3.8.  Etiology unclear (toxicity of medication or progression of liver disease). 3.  Discuss plan for reimaging (chest, abdomen, pelvic CT) on 11/26/2017. 4.  Discuss pain control. Patient is taking the prescribed oxycodone 38m q4h PRN. He notes that this intervention is adequate in managing his pain. Refill Rx for oxycodone IR 5-10 mg q4 PRN (Disp #40)  5.  Discuss weight loss. Patient encouraged to increase calorie and protein intake as much as possible. Patient to use supplement shakes BID to TID.  6.  RTC after CT scan for MD assessment and labs (CBC with diff, CMP, Mg), and review of scans.    BHonor Loh NP  11/29/2017, 7:56 AM   I saw and evaluated the patient, participating in the key portions of the service and reviewing pertinent diagnostic studies and records.  I reviewed the nurse practitioner's note and agree with the findings and the plan.  The assessment and plan were discussed with the patient.  Imaging is needed to address his elevated bilirubin and concern for progressive disease.  Several questions were asked by the patient and answered.   MLequita Asal MD 11/29/2017, 7:56 AM

## 2017-11-30 ENCOUNTER — Inpatient Hospital Stay: Payer: Medicare Other

## 2017-11-30 ENCOUNTER — Telehealth: Payer: Self-pay | Admitting: Urgent Care

## 2017-11-30 ENCOUNTER — Telehealth: Payer: Self-pay | Admitting: *Deleted

## 2017-11-30 ENCOUNTER — Inpatient Hospital Stay (HOSPITAL_BASED_OUTPATIENT_CLINIC_OR_DEPARTMENT_OTHER): Payer: Medicare Other | Admitting: Urgent Care

## 2017-11-30 VITALS — BP 110/74 | HR 114 | Temp 96.8°F | Resp 20 | Wt 135.4 lb

## 2017-11-30 DIAGNOSIS — M069 Rheumatoid arthritis, unspecified: Secondary | ICD-10-CM | POA: Diagnosis not present

## 2017-11-30 DIAGNOSIS — Z86718 Personal history of other venous thrombosis and embolism: Secondary | ICD-10-CM

## 2017-11-30 DIAGNOSIS — C3411 Malignant neoplasm of upper lobe, right bronchus or lung: Secondary | ICD-10-CM

## 2017-11-30 DIAGNOSIS — C7800 Secondary malignant neoplasm of unspecified lung: Secondary | ICD-10-CM

## 2017-11-30 DIAGNOSIS — Z66 Do not resuscitate: Secondary | ICD-10-CM

## 2017-11-30 DIAGNOSIS — D721 Eosinophilia: Secondary | ICD-10-CM | POA: Diagnosis not present

## 2017-11-30 DIAGNOSIS — R188 Other ascites: Secondary | ICD-10-CM | POA: Diagnosis not present

## 2017-11-30 DIAGNOSIS — R5383 Other fatigue: Secondary | ICD-10-CM | POA: Diagnosis not present

## 2017-11-30 DIAGNOSIS — Z87891 Personal history of nicotine dependence: Secondary | ICD-10-CM

## 2017-11-30 DIAGNOSIS — R748 Abnormal levels of other serum enzymes: Secondary | ICD-10-CM

## 2017-11-30 DIAGNOSIS — C220 Liver cell carcinoma: Secondary | ICD-10-CM

## 2017-11-30 DIAGNOSIS — Z79899 Other long term (current) drug therapy: Secondary | ICD-10-CM | POA: Diagnosis not present

## 2017-11-30 DIAGNOSIS — Z8719 Personal history of other diseases of the digestive system: Secondary | ICD-10-CM

## 2017-11-30 DIAGNOSIS — E871 Hypo-osmolality and hyponatremia: Secondary | ICD-10-CM

## 2017-11-30 DIAGNOSIS — C787 Secondary malignant neoplasm of liver and intrahepatic bile duct: Secondary | ICD-10-CM | POA: Diagnosis not present

## 2017-11-30 DIAGNOSIS — R630 Anorexia: Secondary | ICD-10-CM | POA: Diagnosis not present

## 2017-11-30 DIAGNOSIS — I2699 Other pulmonary embolism without acute cor pulmonale: Secondary | ICD-10-CM

## 2017-11-30 DIAGNOSIS — R109 Unspecified abdominal pain: Secondary | ICD-10-CM | POA: Diagnosis not present

## 2017-11-30 DIAGNOSIS — Z801 Family history of malignant neoplasm of trachea, bronchus and lung: Secondary | ICD-10-CM | POA: Diagnosis not present

## 2017-11-30 DIAGNOSIS — R948 Abnormal results of function studies of other organs and systems: Secondary | ICD-10-CM

## 2017-11-30 DIAGNOSIS — Z8701 Personal history of pneumonia (recurrent): Secondary | ICD-10-CM | POA: Diagnosis not present

## 2017-11-30 DIAGNOSIS — I820 Budd-Chiari syndrome: Secondary | ICD-10-CM

## 2017-11-30 DIAGNOSIS — I81 Portal vein thrombosis: Secondary | ICD-10-CM | POA: Diagnosis not present

## 2017-11-30 DIAGNOSIS — R110 Nausea: Secondary | ICD-10-CM

## 2017-11-30 DIAGNOSIS — E43 Unspecified severe protein-calorie malnutrition: Secondary | ICD-10-CM

## 2017-11-30 DIAGNOSIS — R635 Abnormal weight gain: Secondary | ICD-10-CM | POA: Diagnosis not present

## 2017-11-30 DIAGNOSIS — C7951 Secondary malignant neoplasm of bone: Secondary | ICD-10-CM

## 2017-11-30 DIAGNOSIS — G629 Polyneuropathy, unspecified: Secondary | ICD-10-CM

## 2017-11-30 DIAGNOSIS — R772 Abnormality of alphafetoprotein: Secondary | ICD-10-CM

## 2017-11-30 LAB — CBC WITH DIFFERENTIAL/PLATELET
Basophils Absolute: 0.1 10*3/uL (ref 0–0.1)
Basophils Relative: 1 %
Eosinophils Absolute: 0.1 10*3/uL (ref 0–0.7)
Eosinophils Relative: 1 %
HCT: 39.6 % — ABNORMAL LOW (ref 40.0–52.0)
Hemoglobin: 13.3 g/dL (ref 13.0–18.0)
Lymphocytes Relative: 7 %
Lymphs Abs: 0.7 10*3/uL — ABNORMAL LOW (ref 1.0–3.6)
MCH: 33 pg (ref 26.0–34.0)
MCHC: 33.5 g/dL (ref 32.0–36.0)
MCV: 98.6 fL (ref 80.0–100.0)
Monocytes Absolute: 1.3 10*3/uL — ABNORMAL HIGH (ref 0.2–1.0)
Monocytes Relative: 15 %
Neutro Abs: 6.9 10*3/uL — ABNORMAL HIGH (ref 1.4–6.5)
Neutrophils Relative %: 76 %
Platelets: 207 10*3/uL (ref 150–440)
RBC: 4.02 MIL/uL — ABNORMAL LOW (ref 4.40–5.90)
RDW: 17.1 % — ABNORMAL HIGH (ref 11.5–14.5)
WBC: 9.1 10*3/uL (ref 3.8–10.6)

## 2017-11-30 LAB — COMPREHENSIVE METABOLIC PANEL
ALT: 40 U/L (ref 17–63)
AST: 114 U/L — ABNORMAL HIGH (ref 15–41)
Albumin: 2.3 g/dL — ABNORMAL LOW (ref 3.5–5.0)
Alkaline Phosphatase: 165 U/L — ABNORMAL HIGH (ref 38–126)
Anion gap: 5 (ref 5–15)
BUN: 14 mg/dL (ref 6–20)
CO2: 23 mmol/L (ref 22–32)
Calcium: 7.7 mg/dL — ABNORMAL LOW (ref 8.9–10.3)
Chloride: 101 mmol/L (ref 101–111)
Creatinine, Ser: 0.63 mg/dL (ref 0.61–1.24)
GFR calc Af Amer: >60 mL/min (ref >60)
GFR calc non Af Amer: >60 mL/min (ref >60)
Glucose, Bld: 118 mg/dL — ABNORMAL HIGH (ref 65–99)
Potassium: 3.8 mmol/L (ref 3.5–5.1)
Sodium: 129 mmol/L — ABNORMAL LOW (ref 135–145)
Total Bilirubin: 4.9 mg/dL — ABNORMAL HIGH (ref 0.3–1.2)
Total Protein: 7.7 g/dL (ref 6.5–8.1)

## 2017-11-30 LAB — MAGNESIUM: Magnesium: 1.7 mg/dL (ref 1.7–2.4)

## 2017-11-30 MED ORDER — ELIQUIS 5 MG VTE STARTER PACK: ORAL_TABLET | ORAL | 0 refills | Status: DC

## 2017-11-30 NOTE — Telephone Encounter (Signed)
Called patient's daughter to inform her that patient has been placed on Eliquis 10 mg bid for 7 days then 5 mg bid daily indefinately.  Patient was in the office when we were working on getting the medication at no cost for him through [facility].  However, patient left before we were informed that pharmacy did not have the medication.  We then called [facility] who has enough to get him through Monday. On Monday he can pick up the remaining prescription. Called patient's daughter at this time to get her to get in touch with patient. Pharmacy closes @ 6 PM. Verbalized understanding.

## 2017-11-30 NOTE — Telephone Encounter (Signed)
Oral Oncology Patient Advocate Encounter  Met patient in exam room to complete application for [place] in an effort to reduce patient's out of pocket expense for Eliquis to $0.    Application completed and faxed to [phone].   Patient assistance phone number for follow up is [phone].   This encounter will be updated until final determination.     [facility] Patient Advocate [phone] 11/30/2017 4:15 PM    11/30/2017 4:14 PM

## 2017-12-01 MED ORDER — ELIQUIS 5 MG VTE STARTER PACK: ORAL_TABLET | ORAL | 0 refills | Status: DC

## 2017-12-01 NOTE — Progress Notes (Signed)
[facility] day:  11/30/2017  Chief Complaint: Gerald [name] is a 66 y.o. male with stage IV adenocarcinoma of the lung and hepatocellular carcinoma on lenvatinib who is seen for 1 week assessment, hospital follow up, and to review interval imaging.  HPI:  The patient was last seen in the medical oncology clinic on 11/23/2017.  At that time, patient was doing "ok". He continued to complain of significant fatigue. Weight continued to decline; patient down 2 pounds since last visit. Patient continued on Lenvatinib, however he was not taking it as prescribed. He denied B symptoms and recent infections. WBC 7.6 with an [place] of 5200. Hemoglobin 13.7, hematocrit 41.3, and platelets 170,000. LFTs continue to be elevated; AST 121, ALT 60, alkaline phosphatase 201, and total bilirubin 3.8. Magnesium low at 1.6. AFP continues to be elevated at 1457.0 (previously 1188.0). Lenvatinib held and patient sent for interval restaging imaging.   CT scans of the chest, abdomen, and pelvis with contrast were done on 11/26/2017. Results revealed the following:  1. Development of bilateral pulmonary nodules that are consistent with metastatic disease.   2. Left-sided pulmonary emboli.   3. Progression of patient's hepatic metastasis:    a. New 7 mm right hepatic lobe lesion.    b. Lesion in the lateral segment of the left liver lobe has increased in size from 1.5 cm to 2.0 cm.    c. New, or possibly progressive, confluent left lateral segment left liver lobe lesion measuring 3.6 x 2.9 cm.    d. New caudate lobe lesion, measuring 2.9 x 2.7 cm.     e. Developing intrahepatic ductal dilatation within the left liver lobe felt to be secondary to central tumor obstruction.  4. Progression of portal vein thrombus that extends into the extrahepatic portal vein. Tumor thrombus also identified within the left hepatic vein, extending into the intracardiac IVC.   5. Gastrohepatic ligament  node measuring 8 mm.  6. Right-sided colonic wall thickening that most likely represents sequela related to portal venous hypertension and portal vein thrombosis. Concurrent infectious colitis could not be excluded.  7. New small volume abdominopelvic ascites.  Based on the acute findings from his interval imaging, patient was sent to the ED for further evaluation and admission. Patient admitted through the ED at [facility] on 11/26/2017; discharged on 11/28/2017. It was determined the patient had not been taking his prescribed notes apparent injections for several weeks which led to the progression of his intrahepatic thrombus, into the development of left-sided pulmonary emboli. Patient was treated with Augmentin initially for the concern of possible infectious colitis. Augmentin was discontinued and patient started on Ciprofloxacin and Metronidazole. During his admission patient was anticoagulated using heparin. Vascular was consult for possible thrombectomy, however patient was deemed a poor candidate. Following an uncomplicated course of admission, patient was discharged home to follow-up with medical oncology. Despite consultation with Dr. Earlie Server, whereby Eliquis was recommended, patient was discharged home on Xarelto.   Symptomatically, patient is very weak and tired. Patient is very thin and frail appearing, with positive temporal wasting noted. He has been in bed most of the time since being discharged from [facility] on 11/28/2017. He missed his appointment yesterday because he was "just too tired to move". Patient denies nausea, vomiting, and diarrhea. He has had not fevers or sweats. Patient notes that his breathing is stable; denies shortness of breath. Patient with continued diffuse abdominal pain, with the majority of the pain noted  in the RIGHT upper quadrant. He is not jaundiced, and his sclera remain anicteric. Patient has a poor appetite. His weight is up, however he receive intravenous fluids during  his recent admission. Weight is up by 9 pounds since 11/23/2017.  Patient complains of pain in his abdomin that he self rates as a 4/10 today. He has not taken his prescribed pain medications today.  Past Medical History:  Diagnosis Date  . Arthritis    Rheumatoid arthritis  . Cancer ([place])    lung ca   . Collagen vascular disease (HCC)    RA  . Dyspnea   . Hepatitis    b and c  . Mass of lung   . Pneumonia   . Pneumothorax    spontaneous  . Renal disorder    as a child    Past Surgical History:  Procedure Laterality Date  . FLEXIBLE BRONCHOSCOPY N/A 03/30/2016   Procedure: FLEXIBLE BRONCHOSCOPY;  Surgeon: Vilinda Boehringer, MD;  Location: [facility] ORS;  Service: Cardiopulmonary;  Laterality: N/A;  . IR ANGIOGRAM SELECTIVE EACH ADDITIONAL VESSEL  07/24/2017  . IR ANGIOGRAM SELECTIVE EACH ADDITIONAL VESSEL  07/24/2017  . IR ANGIOGRAM SELECTIVE EACH ADDITIONAL VESSEL  07/24/2017  . IR ANGIOGRAM SELECTIVE EACH ADDITIONAL VESSEL  07/24/2017  . IR ANGIOGRAM SELECTIVE EACH ADDITIONAL VESSEL  07/24/2017  . IR ANGIOGRAM SELECTIVE EACH ADDITIONAL VESSEL  07/24/2017  . IR ANGIOGRAM SELECTIVE EACH ADDITIONAL VESSEL  07/24/2017  . IR ANGIOGRAM SELECTIVE EACH ADDITIONAL VESSEL  08/20/2017  . IR ANGIOGRAM VISCERAL SELECTIVE  07/24/2017  . IR ANGIOGRAM VISCERAL SELECTIVE  08/20/2017  . IR EMBO ARTERIAL NOT HEMORR HEMANG INC GUIDE ROADMAPPING  07/24/2017  . IR EMBO TUMOR ORGAN ISCHEMIA INFARCT INC GUIDE ROADMAPPING  08/20/2017  . IR RADIOLOGIST EVAL & MGMT  07/11/2017  . IR US GUIDE VASC ACCESS RIGHT  07/24/2017  . Ureter repair as a child      Family History  Problem Relation Age of Onset  . Brain cancer Mother   . Lung cancer Maternal Uncle   . Cancer Sister        Metastatic cancer- unknown primary    Social History:  reports that he quit smoking about 17 years ago. His smoking use included cigarettes. He has a 13.00 pack-year smoking history. he has never used smokeless tobacco. He reports that he  drinks alcohol. He reports that he does not use drugs.  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  He lives in [place].  Contact number is [phone].  He has a great pyrenees.  The patient is alone today.  Allergies:  Allergies  Allergen Reactions  . Nsaids Other (See Comments)    Can take NSAIDs short term but cannot be on it long term d/t renal insuff  . Sulfa Antibiotics Other (See Comments)    Reaction:  GI upset     Current Medications: Current Outpatient Medications  Medication Sig Dispense Refill  . Calcium Carb-Cholecalciferol (CALCIUM-VITAMIN D) 500-200 MG-UNIT tablet Take 1 tablet by mouth 2 (two) times daily.    . ondansetron (ZOFRAN) 4 MG tablet Take 1 tablet (4 mg total) every 8 (eight) hours as needed by mouth for nausea or vomiting. 30 tablet 0  . oxyCODONE (OXY IR/ROXICODONE) 5 MG immediate release tablet Take 1-2 tablets every 4 hours as needed for pain 40 tablet 0  . ELIQUIS STARTER PACK (ELIQUIS STARTER PACK) 5 MG TABS Take as directed  on package: start with two-16m tablets twice daily for 7 days. On day 8, switch to one-578mtablet twice daily. 1 each 0  . lenvatinib 8 mg daily dose (LENVIMA) 4 (2) MG capsule Take 2 capsules (8 mg total) by mouth daily. (Patient not taking: Reported on 11/30/2017) 60 capsule 0  . protein supplement shake (PREMIER PROTEIN) LIQD Take 325 mLs (11 oz total) by mouth 2 (two) times daily between meals. (Patient not taking: Reported on 11/30/2017) 19500 mL 1   No current facility-administered medications for this visit.     Review of Systems:  GENERAL:  Feels "weak and tired".  Markedly fatigued.  No fever, chills or sweats.  Weight up 9 pounds. PERFORMANCE STATUS (ECOG):  2 HEENT: No visual changes, sore throat, mouth sores or tenderness. Lungs:  Chronic shortness of breath.  No cough. No hemoptysis. Cardiac:  No chest pain, palpitations, orthopnea, or PND.  Sleeps in a recliner. GI:   Appetite poor. No nausea, vomiting, diarrhea, constipation, melena or hematochezia. GU:  No urgency, frequency, dysuria, or hematuria. Musculoskeletal:  Chronic right knee pain. No back pain.  No muscle tenderness. Extremities:  No pain or swelling. Skin:  No rashes or ulcers. Neuro:  Neuropathy (stable) on gabapentin.  No headache, focal weakness, balance or coordination issues. Endocrine:  No diabetes, thyroid issues, hot flashes or night sweats. Psych:  No mood changes, depression or anxiety. Pain: 4/10 - abdomen Review of systems:  All other systems reviewed and found to be negative.  Physical Exam: Blood pressure 110/74, pulse (!) 114, temperature (!) 96.8 F (36 C), temperature source Tympanic, resp. rate 20, weight 135 lb 6 oz (61.4 kg), SpO2 95 %. GENERAL:  Thin, frail, and fatigued appearing gentleman sitting comfortably in the exam room in no acute distress. MENTAL STATUS:  Alert and oriented to person, place and time.  HEAD:Wearing a cap. Short Isiaih Hollenbach hair. GrAlbertina ParrTemporal wasting. Normocephalic, atraumatic, face symmetric, no Cushingoid features. EYES:Blue eyes. Pupils equal round and reactive to light and accomodation. No conjunctivitis or scleral icterus. ENYIR:SWNIOEVOJJlear without lesion. Tonguenormal. Mucous membranes dry. RESPIRATORY:Clear to auscultationwithout rales, wheezes or rhonchi. CARDIOVASCULAR:Regular rate andrhythmwithout murmur, rub or gallop. ABDOMEN:Soft, with tenderness noted over liver.  No guarding or rebound tenderness.  Active bowel sounds and no splenomegaly. No masses. SKIN: Dry.  No rashes. EXTREMITIES: No edema, no skin discoloration or tenderness. No palpable cords. LYMPHNODES: No palpable cervical, supraclavicular, axillary or inguinal adenopathy  NEUROLOGICAL: Unremarkable. PSYCH: Appropriate.    Imaging studies: 03/27/2016:  Chest CT angiogram revealed an 8 cm mass in the medial right upper lobe. The mass  abutted the mediastinum and possibly invaded the left atrium. There was associated widespread thoracic adenopathy. There was a 2.6 cm enhancing lesion in the medial segment of the left hepatic lobe. 04/05/2016:  PET scan revealed intense hypermetabolism (SUV 17.95) associated with a 7.5 cm central perihilar right lung lesion. There was mediastinal (sub-carinal node SUV 8.89; 2.6 cm right paratracheal node SUV 17.49) and upper abdominal retroperitoneal hypermetabolic adenopathy (9 mm aortocaval node SUV 9.2). There were 2 liver metastasis (2.3 cm left lobe SUV 6.19; 2.2 cm segment VIII SUV 5.18). There are multifocal bone metastasis (C2 vertebral body SUV 9.0; 1.6 cm left sacral ala lesion SUV of 8.6).  03/31/2016:  Head MRI was indeterminate for early metastatic disease to the brain. There was a small focus of gyral enhancement in the anterior right frontal lobe likely is post ischemic, while a small posterior right cerebellar enhancing  lesion was more suspicious for small brain metastasis. There were multiple small primarily subacute appearing infarcts in the bilateral MCA and left PICA territories.  04/07/2016:  Bone scan revealed a subtle distal right femur lesion which might be a small metastasis.  Bone scan was felt to be an unreliable modality for evaluation of osseous metastatic disease as the bone metastases seen on the recent PET and MRI were not evident. 05/12/2016:  Head MRI revealed expected interval evolution of bilateral cerebral in cerebellar infarcts. There was resolution of right frontal and right cerebellar enhancement consistent with subacute infarcts. There was a new 3 mm focus of gyral enhancement in the left occipital lobe and a possible new 4 mm enhancing lesion in the left cerebellum (? vascular enhancement versus tiny metastasis).  08/16/2016:  Head MRI revealed multiple areas of improving enhancement and restricted diffusion compatible with resolving subacute infarcts.  There  was no evidence of metastatic disease or acute infarction  10/19/2016:  Chest, abdomen, and pelvic CT scan revealed radiation changes in the right lung.  The superimposed right lung pneumonia had improved.  The 6.4 x 2.4 cm right perihilar mass had decreased.  There was improving thoracic lymphadenopathy.  There was progression of hepatic metastases, measuring up to 4.7 cm.  Specifically, there was a 3.9 x 3.7 cm metastasis in segment 8 (previously 2.5 x 2.8 cm) and a 4.7 x 4.0 cm metastasis in segment 4B (previously 3.1 x 2.6 cm). 11/14/2016:  PET scan revealed 2 liver masses. Both were significantly more hypermetabolic than on 74/94/4967 and have enlarged compared to the prior PET-CT.  The segment 8 lesion was centrally necrotic but with high activity along its periphery (7.4), and about the same size as it was on 10/19/2016. The lateral segment left hepatic lobe lesion had enlarged (5.9 x 5.3 cm) compared to 10/19/16 (4.1 x 4.4 cm).  The bony metastatic lesions were no longer hypermetabolic and have resolved.  There were post therapy and postoperative findings in the right lung, with a considerable amount of suspected radiation pneumonitis and radiation fibrosis. 12/29/2016:  Chest, abdomen, and pelvic CT revealed mild reduction in size and central necrosis of the hepatic metastatic lesions.  There was continued post therapy related findings along the right hilar region. Dominant right perihilar mass was reduced in thickness compared to 10/19/2016.  04/13/2017:  Chest, abdomen, and pelvic CT revealed new left hepatic vein thrombosis. This somewhat obscured the known left hepatic lobe tumor although overall the left hepatic lobe masses were thought to be similar in size compared the prior exam. The segment 8 lesion was less well seen than on the prior exam.  The central necrotic portion was seen but the peripheral rim was much less conspicuous (could represent improvement in this tumor).  There was mild  increase in adenopathy in the gastrohepatic ligament.  There was essentially stable appearance in the lungs with considerable radiation pneumonitis, scarring, and volume loss in the right lung, along with a trace loculated pleural effusion with enhancing margins.  There was stable appearance of prior splenic infarcts and prior scarring in the left mid kidney. 07/02/2017:  Chest, abdomen, and pelvic CT revealed interval increase in the left hepatic vein thrombosis with progressive heterogeneity and enhancement of the left hepatic lobe, predominantly obscuring the known left hepatic lobe lesion. There was a possible new 1.5 cm lateral left hepatic lobe lesion versus focal area of heterogeneity. There was slight interval decrease in the size of the irregular low-attenuation lesion within segment  8 of the liver. Right lung was stable in appearance with interval development of consolidation within the lingula representing atelectasis or infection. 07/05/2017:  Abdominal MRI revealed persistent mass within lateral segment of left lobe of liver with progressive thrombosis of the left portal vein. There was also persistent thrombosis of the left hepatic vein. Portal vein thrombus extended up to the level of the main portal vein and was likely tumor thrombus. The liver has morphologic features suggestive of early cirrhosis. All findings are highly suggestive that the lesion in the left lobe of liver represents hepatoma rather than lung cancer metastasis. The lesion within segment 8 of the liver had decreased in size when compared with 04/13/2017.  The small lesion within segment 7 of the liver appeared new from previous exam. Indeterminate. This may represent an area of lung cancer metastasis or possibly multifocal [place] 09/17/2017:  PET scan revealed the original 2 lesions shown on the prior exam are less cohesivly visible, there were new spreading confluent lesions in segments 1, 2, 3, 4, 5, and 8 of the liver compatible  with progressive multifocal hepatic malignancy. 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  There was post therapy related findings in the lungs without active pulmonary or osseous metastatic disease identified. 11/26/2017:  Chest, abdomen, and pelvic CT revealed development of bilateral pulmonary nodules, consistent with metastatic disease. New 7 mm right hepatic lobe lesion. Lesion in the lateral segment of the left liver lobe has increased in size from 1.5 cm to 2.0 cm. New, or possibly progressive, confluent left lateral segment left liver lobe lesion measuring 3.6 x 2.9 cm. New caudate lobe lesion, measuring 2.9 x 2.7 cm. Moderate volume left-sided pulmonary emboli. Increased tiny left and new trace right pleural effusion. Progression of hepatic metastasis, especially when compared to the prior diagnostic CT of 07/02/2017. Progression of portal and hepatic vein thrombus with IVC thrombus continuing into the intracardiac IVC. Right-sided colonic wall thickening is most likely due to portal venous hypertension and portal vein thrombus. Concurrent infectious colitis could not be excluded. New small volume abdominopelvic ascites. Developing high left hepatic lobe intrahepatic biliary duct dilatation.   Appointment on 11/30/2017  Component Date Value Ref Range Status  . Sodium 11/30/2017 129* 135 - 145 mmol/L Final  . Potassium 11/30/2017 3.8  3.5 - 5.1 mmol/L Final  . Chloride 11/30/2017 101  101 - 111 mmol/L Final  . CO2 11/30/2017 23  22 - 32 mmol/L Final  . Glucose, Bld 11/30/2017 118* 65 - 99 mg/dL Final  . BUN 11/30/2017 14  6 - 20 mg/dL Final  . Creatinine, Ser 11/30/2017 0.63  0.61 - 1.24 mg/dL Final  . Calcium 11/30/2017 7.7* 8.9 - 10.3 mg/dL Final  . Total Protein 11/30/2017 7.7  6.5 - 8.1 g/dL Final  . Albumin 11/30/2017 2.3* 3.5 - 5.0 g/dL Final  . AST 11/30/2017 114* 15 - 41 U/L Final  . ALT 11/30/2017 40  17 - 63 U/L Final  . Alkaline  Phosphatase 11/30/2017 165* 38 - 126 U/L Final  . Total Bilirubin 11/30/2017 4.9* 0.3 - 1.2 mg/dL Final  . GFR calc non Af Amer 11/30/2017 >60  >60 mL/min Final  . GFR calc Af Amer 11/30/2017 >60  >60 mL/min Final   Comment: (NOTE) The eGFR has been calculated using the CKD EPI equation. This calculation has not been validated in all clinical situations. eGFR's persistently <60 mL/min signify possible Chronic Kidney Disease.   Georgiann Hahn  gap 11/30/2017 5  5 - 15 Final   Performed at [facility], [address] [place], [place] [address]  . Magnesium 11/30/2017 1.7  1.7 - 2.4 mg/dL Final   Performed at [facility], [address] [place], [place] [address]  . WBC 11/30/2017 9.1  3.8 - 10.6 K/uL Final  . RBC 11/30/2017 4.02* 4.40 - 5.90 MIL/uL Final  . Hemoglobin 11/30/2017 13.3  13.0 - 18.0 g/dL Final  . HCT 11/30/2017 39.6* 40.0 - 52.0 % Final  . MCV 11/30/2017 98.6  80.0 - 100.0 fL Final  . MCH 11/30/2017 33.0  26.0 - 34.0 pg Final  . MCHC 11/30/2017 33.5  32.0 - 36.0 g/dL Final  . RDW 11/30/2017 17.1* 11.5 - 14.5 % Final  . Platelets 11/30/2017 207  150 - 440 K/uL Final  . Neutrophils Relative % 11/30/2017 76  % Final  . Neutro Abs 11/30/2017 6.9* 1.4 - 6.5 K/uL Final  . Lymphocytes Relative 11/30/2017 7  % Final  . Lymphs Abs 11/30/2017 0.7* 1.0 - 3.6 K/uL Final  . Monocytes Relative 11/30/2017 15  % Final  . Monocytes Absolute 11/30/2017 1.3* 0.2 - 1.0 K/uL Final  . Eosinophils Relative 11/30/2017 1  % Final  . Eosinophils Absolute 11/30/2017 0.1  0 - 0.7 K/uL Final  . Basophils Relative 11/30/2017 1  % Final  . Basophils Absolute 11/30/2017 0.1  0 - 0.1 K/uL Final   Performed at [facility], [address], [place], [place] [address]  Admission on 11/27/2017, Discharged on 11/28/2017  Component Date Value Ref Range Status  . Troponin I 11/26/2017 <0.03  <0.03 ng/mL Final  . WBC 11/26/2017 6.7  3.8 - 10.6 K/uL Final  . RBC 11/26/2017 4.36* 4.40 - 5.90  MIL/uL Final  . Hemoglobin 11/26/2017 14.1  13.0 - 18.0 g/dL Final  . HCT 11/26/2017 43.0  40.0 - 52.0 % Final  . MCV 11/26/2017 98.6  80.0 - 100.0 fL Final  . MCH 11/26/2017 32.3  26.0 - 34.0 pg Final  . MCHC 11/26/2017 32.7  32.0 - 36.0 g/dL Final  . RDW 11/26/2017 16.5* 11.5 - 14.5 % Final  . Platelets 11/26/2017 163  150 - 440 K/uL Final  . Neutrophils Relative % 11/26/2017 74  % Final  . Neutro Abs 11/26/2017 4.9  1.4 - 6.5 K/uL Final  . Lymphocytes Relative 11/26/2017 11  % Final  . Lymphs Abs 11/26/2017 0.7* 1.0 - 3.6 K/uL Final  . Monocytes Relative 11/26/2017 12  % Final  . Monocytes Absolute 11/26/2017 0.8  0.2 - 1.0 K/uL Final  . Eosinophils Relative 11/26/2017 2  % Final  . Eosinophils Absolute 11/26/2017 0.1  0 - 0.7 K/uL Final  . Basophils Relative 11/26/2017 1  % Final  . Basophils Absolute 11/26/2017 0.1  0 - 0.1 K/uL Final  . Sodium 11/26/2017 134* 135 - 145 mmol/L Final  . Potassium 11/26/2017 4.5  3.5 - 5.1 mmol/L Final  . Chloride 11/26/2017 101  101 - 111 mmol/L Final  . CO2 11/26/2017 25  22 - 32 mmol/L Final  . Glucose, Bld 11/26/2017 132* 65 - 99 mg/dL Final  . BUN 11/26/2017 12  6 - 20 mg/dL Final  . Creatinine, Ser 11/26/2017 0.66  0.61 - 1.24 mg/dL Final  . Calcium 11/26/2017 8.3* 8.9 - 10.3 mg/dL Final  . Total Protein 11/26/2017 8.5* 6.5 - 8.1 g/dL Final  . Albumin 11/26/2017 2.8* 3.5 - 5.0 g/dL Final  . AST 11/26/2017 128*  15 - 41 U/L Final  . ALT 11/26/2017 63  17 - 63 U/L Final  . Alkaline Phosphatase 11/26/2017 230* 38 - 126 U/L Final  . Total Bilirubin 11/26/2017 3.5* 0.3 - 1.2 mg/dL Final  . GFR calc non Af Amer 11/26/2017 >60  >60 mL/min Final  . GFR calc Af Amer 11/26/2017 >60  >60 mL/min Final   Comment: (NOTE) The eGFR has been calculated using the CKD EPI equation. This calculation has not been validated in all clinical situations. eGFR's persistently <60 mL/min signify possible Chronic Kidney Disease.   . Anion gap 11/26/2017 8  5 - 15  Final  . aPTT 11/27/2017 >160* 24 - 36 seconds Final   Comment:        IF BASELINE aPTT IS ELEVATED, SUGGEST PATIENT RISK ASSESSMENT BE USED TO DETERMINE APPROPRIATE ANTICOAGULANT THERAPY. CRITICAL RESULT CALLED TO, READ BACK BY AND VERIFIED WITH: ALLISON PATE AT 9562 11/27/17 ALV   . Prothrombin Time 11/27/2017 15.6* 11.4 - 15.2 seconds Final  . INR 11/27/2017 1.25   Final  . Heparin Unfractionated 11/27/2017 0.58  0.30 - 0.70 IU/mL Final   Comment:        IF HEPARIN RESULTS ARE BELOW EXPECTED VALUES, AND PATIENT DOSAGE HAS BEEN CONFIRMED, SUGGEST FOLLOW UP TESTING OF ANTITHROMBIN III LEVELS.   . TSH 11/27/2017 4.224  0.350 - 4.500 uIU/mL Final   Performed by a 3rd Generation assay with a functional sensitivity of <=0.01 uIU/mL.  . Hgb A1c MFr Bld 11/27/2017 5.1  4.8 - 5.6 % Final   Comment: (NOTE)         Prediabetes: 5.7 - 6.4         Diabetes: >6.4         Glycemic control for adults with diabetes: <7.0   . Mean Plasma Glucose 11/27/2017 100  mg/dL Final   Comment: (NOTE) Performed At: [facility] [place], [place] [id number] Rush Farmer MD ON:[id number]   . Heparin Unfractionated 11/27/2017 0.31  0.30 - 0.70 IU/mL Final   Comment:        IF HEPARIN RESULTS ARE BELOW EXPECTED VALUES, AND PATIENT DOSAGE HAS BEEN CONFIRMED, SUGGEST FOLLOW UP TESTING OF ANTITHROMBIN III LEVELS.   Marland Kitchen Heparin Unfractionated 11/27/2017 0.32  0.30 - 0.70 IU/mL Final   Comment:        IF HEPARIN RESULTS ARE BELOW EXPECTED VALUES, AND PATIENT DOSAGE HAS BEEN CONFIRMED, SUGGEST FOLLOW UP TESTING OF ANTITHROMBIN III LEVELS.   Marland Kitchen Sodium 11/27/2017 132* 135 - 145 mmol/L Final  . Potassium 11/27/2017 3.9  3.5 - 5.1 mmol/L Final  . Chloride 11/27/2017 103  101 - 111 mmol/L Final  . CO2 11/27/2017 25  22 - 32 mmol/L Final  . Glucose, Bld 11/27/2017 121* 65 - 99 mg/dL Final  . BUN 11/27/2017 11  6 - 20 mg/dL Final  . Creatinine, Ser 11/27/2017 0.55* 0.61 - 1.24 mg/dL  Final  . Calcium 11/27/2017 7.8* 8.9 - 10.3 mg/dL Final  . Total Protein 11/27/2017 7.2  6.5 - 8.1 g/dL Final  . Albumin 11/27/2017 2.3* 3.5 - 5.0 g/dL Final  . AST 11/27/2017 105* 15 - 41 U/L Final  . ALT 11/27/2017 50  17 - 63 U/L Final  . Alkaline Phosphatase 11/27/2017 188* 38 - 126 U/L Final  . Total Bilirubin 11/27/2017 4.1* 0.3 - 1.2 mg/dL Final  . GFR calc non Af Amer 11/27/2017 >60  >60 mL/min Final  . GFR calc Af Amer 11/27/2017 >60  >60  mL/min Final   Comment: (NOTE) The eGFR has been calculated using the CKD EPI equation. This calculation has not been validated in all clinical situations. eGFR's persistently <60 mL/min signify possible Chronic Kidney Disease.   . Anion gap 11/27/2017 4* 5 - 15 Final  . WBC 11/28/2017 8.2  3.8 - 10.6 K/uL Final  . RBC 11/28/2017 3.99* 4.40 - 5.90 MIL/uL Final  . Hemoglobin 11/28/2017 13.2  13.0 - 18.0 g/dL Final  . HCT 11/28/2017 38.9* 40.0 - 52.0 % Final  . MCV 11/28/2017 97.6  80.0 - 100.0 fL Final  . MCH 11/28/2017 33.1  26.0 - 34.0 pg Final  . MCHC 11/28/2017 33.9  32.0 - 36.0 g/dL Final  . RDW 11/28/2017 16.5* 11.5 - 14.5 % Final  . Platelets 11/28/2017 143* 150 - 440 K/uL Final  . Magnesium 11/27/2017 1.5* 1.7 - 2.4 mg/dL Final  . Heparin Unfractionated 11/28/2017 0.30  0.30 - 0.70 IU/mL Final   Comment:        IF HEPARIN RESULTS ARE BELOW EXPECTED VALUES, AND PATIENT DOSAGE HAS BEEN CONFIRMED, SUGGEST FOLLOW UP TESTING OF ANTITHROMBIN III LEVELS.   Marland Kitchen Heparin Unfractionated 11/28/2017 0.32  0.30 - 0.70 IU/mL Final   Comment:        IF HEPARIN RESULTS ARE BELOW EXPECTED VALUES, AND PATIENT DOSAGE HAS BEEN CONFIRMED, SUGGEST FOLLOW UP TESTING OF ANTITHROMBIN III LEVELS.     Assessment:  Gerald [name] is a 66 y.o. male with stage IV adenocarcinoma of the lung and associated leukocytosis with eosinophilia. He presented with a 30 pound weight loss, shortness of breath, cough, and left leg discomfort. He has a 13-15 pack  year smoking history.  Bronchoscopy on 03/30/2016 revealed an endobronchial lesion in the RUL anterior segment with 95% occlusion. There was extrinsic compression in the right middle lobe secondary to posterior mass effect. Pathology confirmed non-small cell lung cancer, favor adenocarcinoma.  PD-L1 testing revealed high expression (> 50%).  EGFR, ALK, and ROS1 were negative.  CEA was 2.3 on 03/27/2016.  PET scan on 04/05/2016 revealed intense hypermetabolism (SUV 17.95) associated with a 7.5 cm central perihilar right lung lesion. There was mediastinal (sub-carinal node SUV 8.89; 2.6 cm right paratracheal node SUV 17.49) and upper abdominal retroperitoneal hypermetabolic adenopathy (9 mm aortocaval node SUV 9.2). There were 2 liver metastasis (2.3 cm left lobe SUV 6.19; 2.2 cm segment VIII SUV 5.18). There are multifocal bone metastasis (C2 vertebral body SUV 9.0; 1.6 cm left sacral ala lesion SUV of 8.6).   He had marked leukocytosis with hypereosinophilia. Initial WBC was 72,300 with 53% eosinophils. Bone marrow on 03/29/2016 revealed marked increase in marrow eosinophils (approximate 30%). There was no diagnostic morphologic evidence of a myeloproliferative or lymphoid neoplasm. Flow cytometry revealed significant increase in eosinophils (54%). There was relative decreased myeloid cells with no significant immunophenotypic abnormalities or increase in blasts. There was no lymphoid abnormalities or evidence of clonality. FISH studies were negative for myeloproliferative neoplasms with eosinophilia (PDGFRA, PDGFRB, FGFR1). BCR-ABL was negative on 03/28/2016.  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  Left lower extremity duplex on 03/25/2016 revealed no evidence of DVT. Discomfort improved on Neurontin 300 mg a day and continues to improve with declining eosinophilia.  He received 41.4 Gy from 04/11/2016 - 05/23/2016.  He received 4 weeks  of concurrent Carboplatin and Taxol (04/17/2016 - 05/22/2016).    He has received 15 cycles of Keytruda (07/03/2016 - 10/12/2016; 11/22/2016 - 06/05/2017).  He has tolerated treatment well.  He receives [place] every 6 weeks (began 05/22/2016; last 06/26/2017).  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  He was on Xarelto from 04/13/2017 - 07/24/2017. Lovenox injections were not started on 07/13/2017 as ordered; ordered again to start 07/25/2017. Patient stopped injections on or around 10/22/2017 due to pain. He did not inform the office.   He has increased liver function tests.  Etiology is possibly secondary to acute hepatitis, irritation due to thrombosis, or progressive disease (metastatic liver lesion or [place]).  He is hepatitis B core antibody and hepatitis C antibody positive.  Hepatitis C RNA was 5.474 log10 IU/ml (298,000 IU/ml) on 07/03/2017.  Hepatitis C genotype is 1a.  AFP was 589.7 on 07/03/2017.  Chest, abdomen, and pelvic CT on 07/02/2017 revealed interval increase in the left hepatic vein thrombosis with progressive heterogeneity and enhancement of the left hepatic lobe, predominantly obscuring the known left hepatic lobe lesion. There was a possible new 1.5 cm lateral left hepatic lobe lesion versus focal area of heterogeneity. There was slight interval decrease in the size of the irregular low-attenuation lesion within segment 8 of the liver. Right lung was stable in appearance with interval development of consolidation within the lingula representing atelectasis or infection.  Abdominal MRI on 07/05/2017 revealed persistent mass within lateral segment of left lobe of liver with progressive thrombosis of the left portal vein. There was also persistent thrombosis of the left hepatic vein. Portal vein thrombus extended up to the level of the main portal vein and was likely tumor thrombus. The liver has morphologic features suggestive of early cirrhosis. All findings  are highly suggestive that the lesion in the left lobe of liver represents hepatoma rather than lung cancer metastasis. The lesion within segment 8 of the liver had decreased in size when compared with 04/13/2017.  The small lesion within segment 7 of the liver appeared new from previous exam. Indeterminate. This may represent an area of lung cancer metastasis or possibly multifocal HCC.  Ultrasound guided liver biopsy on 07/18/2017 revealed carcinoma c/w hepatocellular carcinoma.  He underwent arteriography, embolization and shunt calculations on 07/24/2017 in preparation for Y-90 radioembolization of his liver.  He underwent left accessory hepatic arteriography with chemotherapy embolization of accessory hepatic artery with doxorubicin-eluting beads on 08/20/2017. Tumor was targeted in the lateral segment of the left lobe of the liver.  AFP has been followed: 589.7 on 07/03/2017, 819.2 on 08/20/2017, 475.6 on 09/07/2017, 1188 on 10/29/2017, and 1457 on 11/23/2017.  PET scan on 09/17/2017 revealed the original 2 lesions shown on the prior exam are less cohesivly visible, there were new spreading confluent lesions in segments 1, 2, 3, 4, 5, and 8 of the liver compatible with progressive multifocal hepatic malignancy. 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  There was post therapy related findings in the lungs without active pulmonary or osseous metastatic disease identified.  He began Lenvatinib on 10/16/2017.  He has been sporadic in his therapy. He takes 3 days in a row then waits a few days and continues. Patient unsure the last day that his Lenvatinib was taken.   Code status is DNR/DNI.  Symptomatically, patient feeling very weak and tired. He expresses frustration with the progression of his disease. He is currently on Xarelto. He has a poor appetite. WBC 9.1 with an [place] of 6900.Hemoglobin 13.3, hematocrit 39.6, platelets 207,000. Magnesium is  normal at 1.7. Sodium low at 129. Calcium  low at 7.7. Albumin low at 2.3. LFTs remain elevated; AST 114, ALT 40, alkaline phosphatase 165, and total bilirubin 4.9.  Plan: 1.  Labs today:  CBC with diff, CMP, Mg, AFP.  2.  Review interval CT scans. Scans demonstrate widespread disease progression.   3.  Discuss anticoagulation. Patient was discharged on Xarelto, however he has progressed on this medication in the past. The consensus from medical oncologists and Dr. Kathlene Cote is that Enoxaparin injections would be the best option for this patient. We discussed this at length, however patient adamantly refuses this medication despite having a 1 month pre-paid supply at home. Warfarin is not a good option for this patient due to the required bridging and continuous monitoring that would be required. He states, "I am not coming over here any more than I have to". Patient was subsequently started on Eliquis 10 mg BID x 7 days, then 5 mg BID. There are no starter packs available to dispense in the clinic today per pharmacy. Rx was called in to [facility], as [facility] did not have the medication and delaying therapy further is not in the patient's best interest. Elease Etienne, SW contacted pharmacy to authorize foundation funding.   4.  Discuss continuing treatment with Lenvatinib. Patient continues to have an elevated total bilirubin of 4.9 (previously 4.1 on 11/27/2017). AST also continues to be elevated at 114 (previously 105). ALT is normal. Alkaline phosphatase has improved to 165 (previously 188). Discussed the benefits versus risks of continuing therapy with patient and with Dr. Randa Evens. Unsure it elevated bilirubin is drug-induced hepatotoxicity or simply related to overt disease progression. CT imaging suggests the latter. Of note,patient has not consistently been on Lenvatinib therapy; his treatment course has been stop and go. I would be hard pressed to say that his LFTs are directly  related to this medication. Patient verbalizes understanding and wishes to proceed with therapy. Lenvatinib restarted today at the 8 mg daily dose.   5.  Discuss Hyponatremia. Sodium 129 today. Patient does not want to stay for IVFs. Patient encouraged to increase fluid intake using ORT solutions like Gatorade. Will recheck sodium next week.   6.  Discuss pain control. Pain 4/10 in the clinic today. Patient is taking the prescribed Oxycodone 47m q4h PRN. He notes that he has not taken any today because he did not want to fall asleep and miss his appointment. Patient verbalizes that the prescribed intervention is adequate in managing his pain. Continue medication as previously prescribed. Current supply adequate.   7.  Discuss nutrition. Albumin is low at 2.3. Patient his gained 9 pounds since his visit on 11/23/2017, however this is likely due to IV fluid administration during patient's recent admisson. Patient encouraged to continue to increase calorie and protein intake to the extent possible. Supplement shakes encouraged 3 times a day.  8.  RTC on 12/06/2017 for MD assessment, labs (CBC with diff, CMP, Mg), and further discussion regarding current course of treatment.    BHonor Loh NP  11/30/2017

## 2017-12-06 ENCOUNTER — Other Ambulatory Visit: Payer: Self-pay

## 2017-12-06 ENCOUNTER — Inpatient Hospital Stay (HOSPITAL_BASED_OUTPATIENT_CLINIC_OR_DEPARTMENT_OTHER): Payer: Medicare Other | Admitting: Hematology and Oncology

## 2017-12-06 ENCOUNTER — Inpatient Hospital Stay: Payer: Medicare Other

## 2017-12-06 VITALS — BP 121/71 | HR 104 | Temp 97.0°F | Resp 20 | Wt 135.1 lb

## 2017-12-06 DIAGNOSIS — G629 Polyneuropathy, unspecified: Secondary | ICD-10-CM | POA: Diagnosis not present

## 2017-12-06 DIAGNOSIS — G893 Neoplasm related pain (acute) (chronic): Secondary | ICD-10-CM

## 2017-12-06 DIAGNOSIS — E871 Hypo-osmolality and hyponatremia: Secondary | ICD-10-CM | POA: Diagnosis not present

## 2017-12-06 DIAGNOSIS — C3411 Malignant neoplasm of upper lobe, right bronchus or lung: Secondary | ICD-10-CM

## 2017-12-06 DIAGNOSIS — C7951 Secondary malignant neoplasm of bone: Secondary | ICD-10-CM

## 2017-12-06 DIAGNOSIS — Z79899 Other long term (current) drug therapy: Secondary | ICD-10-CM

## 2017-12-06 DIAGNOSIS — M069 Rheumatoid arthritis, unspecified: Secondary | ICD-10-CM

## 2017-12-06 DIAGNOSIS — R630 Anorexia: Secondary | ICD-10-CM

## 2017-12-06 DIAGNOSIS — I820 Budd-Chiari syndrome: Secondary | ICD-10-CM

## 2017-12-06 DIAGNOSIS — R948 Abnormal results of function studies of other organs and systems: Secondary | ICD-10-CM

## 2017-12-06 DIAGNOSIS — B1920 Unspecified viral hepatitis C without hepatic coma: Secondary | ICD-10-CM

## 2017-12-06 DIAGNOSIS — B1910 Unspecified viral hepatitis B without hepatic coma: Secondary | ICD-10-CM

## 2017-12-06 DIAGNOSIS — Z801 Family history of malignant neoplasm of trachea, bronchus and lung: Secondary | ICD-10-CM

## 2017-12-06 DIAGNOSIS — Z8701 Personal history of pneumonia (recurrent): Secondary | ICD-10-CM

## 2017-12-06 DIAGNOSIS — R635 Abnormal weight gain: Secondary | ICD-10-CM

## 2017-12-06 DIAGNOSIS — I2699 Other pulmonary embolism without acute cor pulmonale: Secondary | ICD-10-CM

## 2017-12-06 DIAGNOSIS — C220 Liver cell carcinoma: Secondary | ICD-10-CM

## 2017-12-06 DIAGNOSIS — C7800 Secondary malignant neoplasm of unspecified lung: Secondary | ICD-10-CM

## 2017-12-06 DIAGNOSIS — R110 Nausea: Secondary | ICD-10-CM

## 2017-12-06 DIAGNOSIS — R188 Other ascites: Secondary | ICD-10-CM | POA: Diagnosis not present

## 2017-12-06 DIAGNOSIS — C787 Secondary malignant neoplasm of liver and intrahepatic bile duct: Secondary | ICD-10-CM

## 2017-12-06 DIAGNOSIS — R109 Unspecified abdominal pain: Secondary | ICD-10-CM | POA: Diagnosis not present

## 2017-12-06 DIAGNOSIS — Z86718 Personal history of other venous thrombosis and embolism: Secondary | ICD-10-CM

## 2017-12-06 DIAGNOSIS — Z7189 Other specified counseling: Secondary | ICD-10-CM

## 2017-12-06 DIAGNOSIS — Z8719 Personal history of other diseases of the digestive system: Secondary | ICD-10-CM

## 2017-12-06 DIAGNOSIS — I81 Portal vein thrombosis: Secondary | ICD-10-CM | POA: Diagnosis not present

## 2017-12-06 DIAGNOSIS — Z87891 Personal history of nicotine dependence: Secondary | ICD-10-CM

## 2017-12-06 DIAGNOSIS — R5383 Other fatigue: Secondary | ICD-10-CM

## 2017-12-06 DIAGNOSIS — Z66 Do not resuscitate: Secondary | ICD-10-CM

## 2017-12-06 DIAGNOSIS — D721 Eosinophilia: Secondary | ICD-10-CM

## 2017-12-06 LAB — CBC WITH DIFFERENTIAL/PLATELET
BASOS PCT: 0 %
Basophils Absolute: 0 10*3/uL (ref 0–0.1)
EOS ABS: 0 10*3/uL (ref 0–0.7)
EOS PCT: 0 %
HCT: 41.3 % (ref 40.0–52.0)
Hemoglobin: 13.9 g/dL (ref 13.0–18.0)
LYMPHS ABS: 0.7 10*3/uL — AB (ref 1.0–3.6)
Lymphocytes Relative: 7 %
MCH: 33.5 pg (ref 26.0–34.0)
MCHC: 33.7 g/dL (ref 32.0–36.0)
MCV: 99.4 fL (ref 80.0–100.0)
MONOS PCT: 14 %
Monocytes Absolute: 1.5 10*3/uL — ABNORMAL HIGH (ref 0.2–1.0)
Neutro Abs: 8.4 10*3/uL — ABNORMAL HIGH (ref 1.4–6.5)
Neutrophils Relative %: 79 %
PLATELETS: 255 10*3/uL (ref 150–440)
RBC: 4.16 MIL/uL — AB (ref 4.40–5.90)
RDW: 18 % — ABNORMAL HIGH (ref 11.5–14.5)
WBC: 10.7 10*3/uL — AB (ref 3.8–10.6)

## 2017-12-06 LAB — COMPREHENSIVE METABOLIC PANEL
ALK PHOS: 195 U/L — AB (ref 38–126)
ALT: 53 U/L (ref 17–63)
AST: 181 U/L — AB (ref 15–41)
Albumin: 2.3 g/dL — ABNORMAL LOW (ref 3.5–5.0)
Anion gap: 7 (ref 5–15)
BUN: 14 mg/dL (ref 6–20)
CALCIUM: 8 mg/dL — AB (ref 8.9–10.3)
CHLORIDE: 102 mmol/L (ref 101–111)
CO2: 24 mmol/L (ref 22–32)
CREATININE: 0.72 mg/dL (ref 0.61–1.24)
GFR calc Af Amer: >60 mL/min (ref >60)
Glucose, Bld: 119 mg/dL — ABNORMAL HIGH (ref 65–99)
Potassium: 4 mmol/L (ref 3.5–5.1)
Sodium: 133 mmol/L — ABNORMAL LOW (ref 135–145)
Total Bilirubin: 6.8 mg/dL — ABNORMAL HIGH (ref 0.3–1.2)
Total Protein: 8.5 g/dL — ABNORMAL HIGH (ref 6.5–8.1)

## 2017-12-06 LAB — MAGNESIUM: Magnesium: 1.7 mg/dL (ref 1.7–2.4)

## 2017-12-06 MED ORDER — ENOXAPARIN SODIUM 60 MG/0.6ML ~~LOC~~ SOLN: 60.0000 mg | Freq: Two times a day (BID) | SUBCUTANEOUS | 1 refills | Status: DC

## 2017-12-06 NOTE — Progress Notes (Signed)
[facility] day:  12/06/2017   Chief Complaint: Gerald [name] is a 66 y.o. male with stage IV adenocarcinoma of the lung and hepatocellular carcinoma on lenvatinib who is seen for 2 week assessment, review of interval studies, and discussion regarding direction of therapy.  HPI:  The patient was last seen by me in the medical oncology clinic on 11/23/2017.  At that time, Gerald [name] was fatigued.  Gerald [name] had lost 2 pounds since his last visit. WBC was 7600 with an [place] of 5200. Hemoglobin was 13.7, hematocrit 41.3,  platelets 170,000. Total bilirubin was elevated at 3.8.  Lenvatinib was held.  CT scans were ordered.  Chest, abdomen, and pelvic CT scan on 11/26/2017 revealed development of bilateral pulmonary nodules, consistent with metastatic disease.  There was moderate volume left-sided pulmonary emboli.  There was similar radiation change within the medial right lung.  There was increased tiny left and new trace right pleural effusion.  There was progression of hepatic metastasis, especially when compared to the prior diagnostic CT of 07/02/2017.  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  There was right-sided colonic wall thickening is most likely due to portal venous hypertension and portal vein thrombus.  Concurrent infectious colitis could not be excluded.  There was new small volume abdominopelvic ascites.  There was developing high left hepatic lobe intrahepatic biliary duct dilatation.   Gerald [name] was admitted to [facility] from 11/27/2017 - 11/28/2017 with shortness of breath.  Gerald [name] was initiated treated with Heparin then Xarelto.  Vascular surgery did not recommend thrombolysis.  Gerald [name] was treated with Augmentin then switched to Ciprofloxacin and Flagyl for colitis.  Gerald [name] was seen by Honor Loh, NP on 11/30/2017.  Symptomatically, Gerald [name] was weak and fatigued.  Abdominal pain was 4 out of 10.  Gerald [name] had gained 9  pounds.  Gerald [name] was switched to Eliquis.  Lenvatinib was restarted.  Today, patient states, "I guess I am doing ok". Patient denies any acute physical concerns today. Patient notes that his breathing is "normal for him". Gerald [name] experiences exertional dyspnea. Patient describes his day as being "not much to it". Patient spends most of his day "puttering around the house". His daughter checks in via phone on a regular basis. Patient's son "stops by" in the morning and in the evening. Patient continues to be able to complete all of his activities of daily living independently.  Regarding his Lenvatinib, patient notes that Gerald [name] likes to take it when Gerald [name] eats. Gerald [name] states, "my eating habits are so sporadic, I don't take them on a regular basis". Patient often misses doses. Gerald [name] still has pills remaining from the initial 30 day supply. Patient states, "I am doing better, but I still have a few left over". Inquired further as to how many tabs that the patient has left, and Gerald [name] state, "at least a week and a half, maybe a few more". Patient's weight remains stable.  Patient starting to become jaundiced. Scleral icterus is noted.      Past Medical History:  Diagnosis Date  . Arthritis    Rheumatoid arthritis  . Cancer ([place])    lung ca   . Collagen vascular disease (HCC)    RA  . Dyspnea   . Hepatitis    b and c  . Mass of lung   . Pneumonia   . Pneumothorax    spontaneous  . Renal disorder    as  a child    Past Surgical History:  Procedure Laterality Date  . FLEXIBLE BRONCHOSCOPY N/A 03/30/2016   Procedure: FLEXIBLE BRONCHOSCOPY;  Surgeon: Vilinda Boehringer, MD;  Location: [facility] ORS;  Service: Cardiopulmonary;  Laterality: N/A;  . IR ANGIOGRAM SELECTIVE EACH ADDITIONAL VESSEL  07/24/2017  . IR ANGIOGRAM SELECTIVE EACH ADDITIONAL VESSEL  07/24/2017  . IR ANGIOGRAM SELECTIVE EACH ADDITIONAL VESSEL  07/24/2017  . IR ANGIOGRAM SELECTIVE EACH ADDITIONAL VESSEL  07/24/2017  . IR ANGIOGRAM SELECTIVE EACH ADDITIONAL VESSEL   07/24/2017  . IR ANGIOGRAM SELECTIVE EACH ADDITIONAL VESSEL  07/24/2017  . IR ANGIOGRAM SELECTIVE EACH ADDITIONAL VESSEL  07/24/2017  . IR ANGIOGRAM SELECTIVE EACH ADDITIONAL VESSEL  08/20/2017  . IR ANGIOGRAM VISCERAL SELECTIVE  07/24/2017  . IR ANGIOGRAM VISCERAL SELECTIVE  08/20/2017  . IR EMBO ARTERIAL NOT HEMORR HEMANG INC GUIDE ROADMAPPING  07/24/2017  . IR EMBO TUMOR ORGAN ISCHEMIA INFARCT INC GUIDE ROADMAPPING  08/20/2017  . IR RADIOLOGIST EVAL & MGMT  07/11/2017  . IR US GUIDE VASC ACCESS RIGHT  07/24/2017  . Ureter repair as a child      Family History  Problem Relation Age of Onset  . Brain cancer Mother   . Lung cancer Maternal Uncle   . Cancer Sister        Metastatic cancer- unknown primary    Social History:  reports that Gerald [name] quit smoking about 17 years ago. His smoking use included cigarettes. Gerald [name] has a 13.00 pack-year smoking history. Gerald [name] has never used smokeless tobacco. Gerald [name] reports that Gerald [name] drinks alcohol. Gerald [name] reports that Gerald [name] does not use drugs.  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  Gerald [name] was exposed to chemicals and asbestos.  Gerald [name] lives in [place].  Contact number is [phone].  Gerald [name] has a great pyrenees.  The patient is alone today.  Allergies:  Allergies  Allergen Reactions  . Nsaids Other (See Comments)    Can take NSAIDs short term but cannot be on it long term d/t renal insuff  . Sulfa Antibiotics Other (See Comments)    Reaction:  GI upset     Current Medications: Current Outpatient Medications  Medication Sig Dispense Refill  . Calcium Carb-Cholecalciferol (CALCIUM-VITAMIN D) 500-200 MG-UNIT tablet Take 1 tablet by mouth 2 (two) times daily.    Marland Kitchen lenvatinib 8 mg daily dose (LENVIMA) 4 (2) MG capsule Take 2 capsules (8 mg total) by mouth daily. 60 capsule 0  . oxyCODONE (OXY IR/ROXICODONE) 5 MG immediate release tablet Take 1-2 tablets every 4 hours as needed for pain 40 tablet 0  . ELIQUIS STARTER PACK (ELIQUIS STARTER PACK) 5 MG TABS Take as directed on  package: start with two-'5mg'$  tablets twice daily for 7 days. On day 8, switch to one-'5mg'$  tablet twice daily. (Patient not taking: Reported on 12/06/2017) 1 each 0  . ondansetron (ZOFRAN) 4 MG tablet Take 1 tablet (4 mg total) every 8 (eight) hours as needed by mouth for nausea or vomiting. (Patient not taking: Reported on 12/06/2017) 30 tablet 0  . protein supplement shake (PREMIER PROTEIN) LIQD Take 325 mLs (11 oz total) by mouth 2 (two) times daily between meals. (Patient not taking: Reported on 11/30/2017) 19500 mL 1   No current facility-administered medications for this visit.     Review of Systems:  GENERAL:  Feels "ok".  Fatigued.  No fever, chills or sweats.  Weight stable.  PERFORMANCE STATUS (ECOG):  2 HEENT: No visual changes, sore throat, mouth sores or tenderness.  Lungs:  Chronic shortness of breath.  No cough. No hemoptysis. Cardiac:  No chest pain, palpitations, orthopnea, or PND.  Sleeps in a recliner. GI:  Appetite poor.  Eats sporadically.  Nausea. No vomiting, diarrhea, constipation, melena or hematochezia. GU:  No urgency, frequency, dysuria, or hematuria. Musculoskeletal:  Chronic right knee pain. No back pain.  No muscle tenderness. Extremities:  No pain or swelling. Skin:  No rashes or ulcers. Neuro:  Neuropathy (stable) on gabapentin.  No headache, focal weakness, balance or coordination issues. Endocrine:  No diabetes, thyroid issues, hot flashes or night sweats. Psych:  No mood changes, depression or anxiety. Pain:  No pain today.  Review of systems:  All other systems reviewed and found to be negative.  Physical Exam: Blood pressure 121/71, pulse (!) 104, temperature (!) 97 F (36.1 C), temperature source Tympanic, resp. rate 20, weight 135 lb 1.6 oz (61.3 kg). GENERAL:  Thin fatigued appearing gentleman sitting comfortably in the exam room in no acute distress. MENTAL STATUS:  Alert and oriented to person, place and time.  HEAD:Wearing a cap. Short gray  hair. Thin gray beard. Temporal wasting. Normocephalic, atraumatic, face symmetric, no Cushingoid features. EYES:Blue eyes. Scleral icterus.  Pupils equal round and reactive to light and accomodation. No conjunctivitis. WNU:[id number] clear without lesion. Tonguenormal. Mucous membranes dry. RESPIRATORY:Clear to auscultationwithout rales, wheezes or rhonchi. CARDIOVASCULAR:Regular rate andrhythmwithout murmur, rub or gallop. ABDOMEN:Soft, slightly tender over liver.  No guarding or rebound tenderness.  Active bowel sounds and no splenomegaly. No masses. SKIN: Jaundiced.  Dry.  No rashes. EXTREMITIES: No edema, no skin discoloration or tenderness. No palpable cords. LYMPHNODES: No palpable cervical, supraclavicular, axillary or inguinal adenopathy  NEUROLOGICAL: Unremarkable. PSYCH: Appropriate.    Imaging studies: 03/27/2016:  Chest CT angiogram revealed an 8 cm mass in the medial right upper lobe. The mass abutted the mediastinum and possibly invaded the left atrium. There was associated widespread thoracic adenopathy. There was a 2.6 cm enhancing lesion in the medial segment of the left hepatic lobe. 04/05/2016:  PET scan revealed intense hypermetabolism (SUV 17.95) associated with a 7.5 cm central perihilar right lung lesion. There was mediastinal (sub-carinal node SUV 8.89; 2.6 cm right paratracheal node SUV 17.49) and upper abdominal retroperitoneal hypermetabolic adenopathy (9 mm aortocaval node SUV 9.2). There were 2 liver metastasis (2.3 cm left lobe SUV 6.19; 2.2 cm segment VIII SUV 5.18). There are multifocal bone metastasis (C2 vertebral body SUV 9.0; 1.6 cm left sacral ala lesion SUV of 8.6).  03/31/2016:  Head MRI was indeterminate for early metastatic disease to the brain. There was a small focus of gyral enhancement in the anterior right frontal lobe likely is post ischemic, while a small posterior right cerebellar enhancing lesion was more suspicious for  small brain metastasis. There were multiple small primarily subacute appearing infarcts in the bilateral MCA and left PICA territories.  04/07/2016:  Bone scan revealed a subtle distal right femur lesion which might be a small metastasis.  Bone scan was felt to be an unreliable modality for evaluation of osseous metastatic disease as the bone metastases seen on the recent PET and MRI were not evident. 05/12/2016:  Head MRI revealed expected interval evolution of bilateral cerebral in cerebellar infarcts. There was resolution of right frontal and right cerebellar enhancement consistent with subacute infarcts. There was a new 3 mm focus of gyral enhancement in the left occipital lobe and a possible new 4 mm enhancing lesion in the left cerebellum (? vascular enhancement versus tiny metastasis).  08/16/2016:  Head MRI revealed multiple areas of improving enhancement and restricted diffusion compatible with resolving subacute infarcts.  There was no evidence of metastatic disease or acute infarction  10/19/2016:  Chest, abdomen, and pelvic CT scan revealed radiation changes in the right lung.  The superimposed right lung pneumonia had improved.  The 6.4 x 2.4 cm right perihilar mass had decreased.  There was improving thoracic lymphadenopathy.  There was progression of hepatic metastases, measuring up to 4.7 cm.  Specifically, there was a 3.9 x 3.7 cm metastasis in segment 8 (previously 2.5 x 2.8 cm) and a 4.7 x 4.0 cm metastasis in segment 4B (previously 3.1 x 2.6 cm). 11/14/2016:  PET scan revealed 2 liver masses. Both were significantly more hypermetabolic than on 29/51/8841 and have enlarged compared to the prior PET-CT.  The segment 8 lesion was centrally necrotic but with high activity along its periphery (7.4), and about the same size as it was on 10/19/2016. The lateral segment left hepatic lobe lesion had enlarged (5.9 x 5.3 cm) compared to 10/19/16 (4.1 x 4.4 cm).  The bony metastatic lesions were no  longer hypermetabolic and have resolved.  There were post therapy and postoperative findings in the right lung, with a considerable amount of suspected radiation pneumonitis and radiation fibrosis. 12/29/2016:  Chest, abdomen, and pelvic CT revealed mild reduction in size and central necrosis of the hepatic metastatic lesions.  There was continued post therapy related findings along the right hilar region. Dominant right perihilar mass was reduced in thickness compared to 10/19/2016.  04/13/2017:  Chest, abdomen, and pelvic CT revealed new left hepatic vein thrombosis. This somewhat obscured the known left hepatic lobe tumor although overall the left hepatic lobe masses were thought to be similar in size compared the prior exam. The segment 8 lesion was less well seen than on the prior exam.  The central necrotic portion was seen but the peripheral rim was much less conspicuous (could represent improvement in this tumor).  There was mild increase in adenopathy in the gastrohepatic ligament.  There was essentially stable appearance in the lungs with considerable radiation pneumonitis, scarring, and volume loss in the right lung, along with a trace loculated pleural effusion with enhancing margins.  There was stable appearance of prior splenic infarcts and prior scarring in the left mid kidney. 07/02/2017:  Chest, abdomen, and pelvic CT revealed interval increase in the left hepatic vein thrombosis with progressive heterogeneity and enhancement of the left hepatic lobe, predominantly obscuring the known left hepatic lobe lesion. There was a possible new 1.5 cm lateral left hepatic lobe lesion versus focal area of heterogeneity. There was slight interval decrease in the size of the irregular low-attenuation lesion within segment 8 of the liver. Right lung was stable in appearance with interval development of consolidation within the lingula representing atelectasis or infection. 07/05/2017:  Abdominal MRI revealed  persistent mass within lateral segment of left lobe of liver with progressive thrombosis of the left portal vein. There was also persistent thrombosis of the left hepatic vein. Portal vein thrombus extended up to the level of the main portal vein and was likely tumor thrombus. The liver has morphologic features suggestive of early cirrhosis. All findings are highly suggestive that the lesion in the left lobe of liver represents hepatoma rather than lung cancer metastasis. The lesion within segment 8 of the liver had decreased in size when compared with 04/13/2017.  The small lesion within segment 7 of the liver appeared new from previous exam.  Indeterminate. This may represent an area of lung cancer metastasis or possibly multifocal [place] 09/17/2017:  PET scan revealed the original 2 lesions shown on the prior exam are less cohesivly visible, there were new spreading confluent lesions in segments 1, 2, 3, 4, 5, and 8 of the liver compatible with progressive multifocal hepatic malignancy. 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  There was post therapy related findings in the lungs without active pulmonary or osseous metastatic disease identified. 11/26/2017:  Chest, abdomen, and pelvic CT revealed development of bilateral pulmonary nodules, consistent with metastatic disease.  There was moderate volume left-sided pulmonary emboli.  There was similar radiation change within the medial right lung.  There was increased tiny left and new trace right pleural effusion.  There was progression of hepatic metastasis, especially when compared to the prior diagnostic CT of 07/02/2017.  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  There was right-sided colonic wall thickening is most likely due to portal venous hypertension and portal vein thrombus.  Concurrent infectious colitis could not be excluded.  There  was new small volume abdominopelvic ascites.  There was developing high left hepatic lobe intrahepatic biliary duct dilatation.    Appointment on 12/06/2017  Component Date Value Ref Range Status  . Sodium 12/06/2017 133* 135 - 145 mmol/L Final  . Potassium 12/06/2017 4.0  3.5 - 5.1 mmol/L Final  . Chloride 12/06/2017 102  101 - 111 mmol/L Final  . CO2 12/06/2017 24  22 - 32 mmol/L Final  . Glucose, Bld 12/06/2017 119* 65 - 99 mg/dL Final  . BUN 12/06/2017 14  6 - 20 mg/dL Final  . Creatinine, Ser 12/06/2017 0.72  0.61 - 1.24 mg/dL Final  . Calcium 12/06/2017 8.0* 8.9 - 10.3 mg/dL Final  . Total Protein 12/06/2017 8.5* 6.5 - 8.1 g/dL Final  . Albumin 12/06/2017 2.3* 3.5 - 5.0 g/dL Final  . AST 12/06/2017 181* 15 - 41 U/L Final  . ALT 12/06/2017 53  17 - 63 U/L Final  . Alkaline Phosphatase 12/06/2017 195* 38 - 126 U/L Final  . Total Bilirubin 12/06/2017 6.8* 0.3 - 1.2 mg/dL Final  . GFR calc non Af Amer 12/06/2017 >60  >60 mL/min Final  . GFR calc Af Amer 12/06/2017 >60  >60 mL/min Final   Comment: (NOTE) The eGFR has been calculated using the CKD EPI equation. This calculation has not been validated in all clinical situations. eGFR's persistently <60 mL/min signify possible Chronic Kidney Disease.   Georgiann Hahn gap 12/06/2017 7  5 - 15 Final   Performed at [facility], [address] [place],  [address]  . WBC 12/06/2017 10.7* 3.8 - 10.6 K/uL Final  . RBC 12/06/2017 4.16* 4.40 - 5.90 MIL/uL Final  . Hemoglobin 12/06/2017 13.9  13.0 - 18.0 g/dL Final  . HCT 12/06/2017 41.3  40.0 - 52.0 % Final  . MCV 12/06/2017 99.4  80.0 - 100.0 fL Final  . MCH 12/06/2017 33.5  26.0 - 34.0 pg Final  . MCHC 12/06/2017 33.7  32.0 - 36.0 g/dL Final  . RDW 12/06/2017 18.0* 11.5 - 14.5 % Final  . Platelets 12/06/2017 255  150 - 440 K/uL Final  . Neutrophils Relative % 12/06/2017 79  % Final  . Neutro Abs 12/06/2017 8.4* 1.4 - 6.5 K/uL Final  . Lymphocytes Relative 12/06/2017 7  % Final   . Lymphs Abs 12/06/2017 0.7*  1.0 - 3.6 K/uL Final  . Monocytes Relative 12/06/2017 14  % Final  . Monocytes Absolute 12/06/2017 1.5* 0.2 - 1.0 K/uL Final  . Eosinophils Relative 12/06/2017 0  % Final  . Eosinophils Absolute 12/06/2017 0.0  0 - 0.7 K/uL Final  . Basophils Relative 12/06/2017 0  % Final  . Basophils Absolute 12/06/2017 0.0  0 - 0.1 K/uL Final   Performed at [facility], [address], [place], [place] [address]  . Magnesium 12/06/2017 1.7  1.7 - 2.4 mg/dL Final   Performed at [facility], [address] [place], [place] [address]    Assessment:  Kaedan Richert is a 66 y.o. male with stage IV adenocarcinoma of the lung and associated leukocytosis with eosinophilia. Gerald [name] presented with a 30 pound weight loss, shortness of breath, cough, and left leg discomfort. Gerald [name] has a 13-15 pack year smoking history.  Bronchoscopy on 03/30/2016 revealed an endobronchial lesion in the RUL anterior segment with 95% occlusion. There was extrinsic compression in the right middle lobe secondary to posterior mass effect. Pathology confirmed non-small cell lung cancer, favor adenocarcinoma.  PD-L1 testing revealed high expression (> 50%).  EGFR, ALK, and ROS1 were negative.  CEA was 2.3 on 03/27/2016.  PET scan on 04/05/2016 revealed intense hypermetabolism (SUV 17.95) associated with a 7.5 cm central perihilar right lung lesion. There was mediastinal (sub-carinal node SUV 8.89; 2.6 cm right paratracheal node SUV 17.49) and upper abdominal retroperitoneal hypermetabolic adenopathy (9 mm aortocaval node SUV 9.2). There were 2 liver metastasis (2.3 cm left lobe SUV 6.19; 2.2 cm segment VIII SUV 5.18). There are multifocal bone metastasis (C2 vertebral body SUV 9.0; 1.6 cm left sacral ala lesion SUV of 8.6).   Gerald [name] had marked leukocytosis with hypereosinophilia. Initial WBC was 72,300 with 53% eosinophils. Bone marrow on 03/29/2016 revealed marked increase in marrow eosinophils  (approximate 30%). There was no diagnostic morphologic evidence of a myeloproliferative or lymphoid neoplasm. Flow cytometry revealed significant increase in eosinophils (54%). There was relative decreased myeloid cells with no significant immunophenotypic abnormalities or increase in blasts. There was no lymphoid abnormalities or evidence of clonality. FISH studies were negative for myeloproliferative neoplasms with eosinophilia (PDGFRA, PDGFRB, FGFR1). BCR-ABL was negative on 03/28/2016.  Gerald [name] has left leg discomfort likely secondary to a peripheral neuropathy caused by his eosinophilia.  Left lower extremity duplex on 03/25/2016 revealed no evidence of DVT. Discomfort improved on Neurontin 300 mg a day and continues to improve with declining eosinophilia.  Gerald [name] received 41.4 Gy from 04/11/2016 - 05/23/2016.  Gerald [name] received 4 weeks of concurrent carboplatin and Taxol (04/17/2016 - 05/22/2016).    Gerald [name] has received 15 cycles of Keytruda (07/03/2016 - 10/12/2016; 11/22/2016 - 06/05/2017).  Gerald [name] has tolerated treatment well.  Gerald [name] receives [place] every 6 weeks (began 05/22/2016; last 06/26/2017).  Gerald [name] was diagnosed with left hepatic vein thrombosis on 04/13/2017.  Gerald [name] was on Xarelto from 04/13/2017 - 07/24/2017. Lovenox injections were not started on 07/13/2017 as ordered; ordered again to start 07/25/2017.   Gerald [name] has increased liver function tests.  Etiology is possibly secondary to acute hepatitis, irritation due to thrombosis, or progressive disease (metastatic liver lesion or [place]).  Gerald [name] is hepatitis B core antibody and hepatitis C antibody positive.  Hepatitis C RNA was 5.474 log10 IU/ml (298,000 IU/ml) on 07/03/2017.  Hepatitis C genotype is 1a.  AFP was 589.7 on 07/03/2017.  Chest, abdomen, and pelvic CT on 07/02/2017 revealed interval increase in the left hepatic vein thrombosis with progressive  heterogeneity and enhancement of the left hepatic lobe, predominantly obscuring the known left hepatic lobe lesion.  There was a possible new 1.5 cm lateral left hepatic lobe lesion versus focal area of heterogeneity. There was slight interval decrease in the size of the irregular low-attenuation lesion within segment 8 of the liver. Right lung was stable in appearance with interval development of consolidation within the lingula representing atelectasis or infection.  Abdominal MRI on 07/05/2017 revealed persistent mass within lateral segment of left lobe of liver with progressive thrombosis of the left portal vein. There was also persistent thrombosis of the left hepatic vein. Portal vein thrombus extended up to the level of the main portal vein and was likely tumor thrombus. The liver has morphologic features suggestive of early cirrhosis. All findings are highly suggestive that the lesion in the left lobe of liver represents hepatoma rather than lung cancer metastasis. The lesion within segment 8 of the liver had decreased in size when compared with 04/13/2017.  The small lesion within segment 7 of the liver appeared new from previous exam. Indeterminate. This may represent an area of lung cancer metastasis or possibly multifocal HCC.  Ultrasound guided liver biopsy on 07/18/2017 revealed carcinoma c/w hepatocellular carcinoma.   Gerald [name] underwent arteriography, embolization and shunt calculations on 07/24/2017 in preparation for Y-90 radioembolization of his liver.  Gerald [name] underwent left accessory hepatic arteriography with chemotherapy embolization of accessory hepatic artery with doxorubicin-eluting beads on 08/20/2017. Tumor was targeted in the lateral segment of the left lobe of the liver.  AFP has been followed: 589.7 on 07/03/2017, 819.2 on 08/20/2017, 475.6 on 09/07/2017, 1188 on 10/29/2017, and 1457 on 11/23/2017.  PET scan on 09/17/2017 revealed the original 2 lesions shown on the prior exam are less cohesivly visible, there were new spreading confluent lesions in segments 1, 2, 3, 4, 5, and 8 of the liver  compatible with progressive multifocal hepatic malignancy. 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  There was post therapy related findings in the lungs without active pulmonary or osseous metastatic disease identified.  Chest, abdomen, and pelvic CT on 11/26/2017 revealed development of bilateral pulmonary nodules, consistent with metastatic disease.  There was moderate volume left-sided pulmonary emboli.  There was increased tiny left and new trace right pleural effusion.  There was progression of hepatic metastasis, when compared to the prior diagnostic CT of 07/02/2017.  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  There was right-sided colonic wall thickening is most likely due to portal venous hypertension and portal vein thrombus. There was new small volume abdominopelvic ascites.  There was developing high left hepatic lobe intrahepatic biliary duct dilatation.  Gerald [name] began lenvatinib on 10/16/2017.  Gerald [name] takes it sporadically.  Code status is DNR/DNI.  Symptomatically, Gerald [name] remains fatigued.  Gerald [name] is trying to eat better. His weight is stable. Patient with abdominal tenderness; mainly over RIGHT upper quadrant. Gerald [name] is jaundiced with scleral icterus noted. WBC is 10,700 with an Caroline of 8400. Hemoglobin 13.9, hematocrit 41.3,  platelets 255,000. Total bilirubin is 6.8   Plan: 1.  Labs today:  CBC with diff, CMP. 2.  Discuss interval imaging studies.  Discuss progressive lung cancer and hepatocellular cancer.  Images reviewed with radiology.  Progressive disease in liver secondary to hepatocellular carcinoma.  Life span limited by progressive hepatocellular carcinoma. 3.  Discuss pulmonary emboli and need for anticoagulation. 4.  Discuss worsening hepatic function. Total  bilirubin is up to 6.8. Child-Pugh score calculated at 10 (class C), which unfortunately means that the Eliquis is no  longer an appropriate option for this patient. Of note, patient has not even filled the Eliquis as it was prescribed last week. Revisited the use of Lovenox (enoxaparin), however patient remains very reluctant. Patient presented with no other options for his anticoagulation needs. Gerald [name] finally agrees to starting enoxaparin injections.  Rx provided. 5.  Discuss hospice referral. Patient familiar with hospice services, citing the fact that previous family members have been under hospice services. We discussed with patient that if current condition does not turn around, Gerald [name] has a very poor prognosis and is not expected to live for the next 1-2 months. Patient asking to be able to think about hospice further. We will plan to revisit at next visit.  6.  Discuss elevated bilirubin. Discuss elevation related to his [place]. Patient to continue lenvatinib (has taken sporadically).  7.  Discuss pain control. Patient is taking the prescribed oxycodone 32m q4h PRN. Gerald [name] notes that this intervention is adequate in managing his pain.  8.  Discuss weight. Patient encouraged to increase calorie and protein intake as much as possible. Patient to use supplement shakes BID to TID.  9.  RTC in 1 week for MD assessment, labs (CBC with diff, CMP), and further discussion regarding direction of therapy.   BHonor Loh NP  12/06/2017, 4:56 PM   I saw and evaluated the patient, participating in the key portions of the service and reviewing pertinent diagnostic studies and records.  I reviewed the nurse practitioner's note and agree with the findings and the plan.  The assessment and plan were discussed with the patient.  Over 40 minutes were spent with the patient.  Multiple questions were asked by the patient and answered.   MLequita Asal MD 12/06/2017, 4:56 PM

## 2017-12-06 NOTE — Progress Notes (Signed)
Here for follow up  Stated that he never started Eliquis -since [facility] didn't have it in stock and [facility] did not have script. Also not taking protein shakes -due to finances -given ensure x 4 today.  Will share info w Dr Mike Gip and Doran Durand NP

## 2017-12-07 ENCOUNTER — Other Ambulatory Visit: Payer: Self-pay | Admitting: *Deleted

## 2017-12-07 DIAGNOSIS — C220 Liver cell carcinoma: Secondary | ICD-10-CM

## 2017-12-07 NOTE — Telephone Encounter (Signed)
Oral Oncology Patient Advocate Encounter   [place] about patients application for Eliquis. They requested a newer application and I have been trying to fax for two days. Spoke with  Doyne Keel. And she is going to send to a Freight forwarder . Their fax machine is down and they are not going to process applications until January 2019. I told her this patient needs this medication. She will call me back. Verified my phone number with them.    [place] Patient Advocate [phone] 12/07/2017 9:45 AM

## 2017-12-10 ENCOUNTER — Telehealth: Payer: Self-pay | Admitting: Urgent Care

## 2017-12-10 NOTE — Telephone Encounter (Signed)
Oral Oncology Patient Advocate Encounter   [facility] to find out about the application for Gerald [name]'s Eliquis. They said that they had not gotten the new updated application. I explained I could not help that there fax was broken and I had tried several days to fax the new application over to them and that our patient needs his medication. She said it takes several days for the application to go thru the fax, then to get to them to start processing. I said this is not excitable. She gave me her supervisors fax number and asked me to fax it to her Willette Cluster [phone]. I faxed application over to her to process.   Will follow up later today.   [place] Patient Advocate [phone] 12/10/2017 9:47 AM

## 2017-12-11 ENCOUNTER — Encounter: Payer: Self-pay | Admitting: Hematology and Oncology

## 2017-12-12 ENCOUNTER — Telehealth: Payer: Self-pay | Admitting: Urgent Care

## 2017-12-12 NOTE — Telephone Encounter (Signed)
Oral Oncology Patient Advocate Encounter  Called to cancel shipment of Eliquis with [place] spoke with Patty C. She said if he ever needs just to give them a call back.   [place] Patient Advocate [phone] 12/12/2017 9:54 AM

## 2017-12-12 NOTE — Telephone Encounter (Signed)
He is Child-Pugh class C at this point. We can no longer use Eliquis with him. I believe that he was going to give the Enoxaparin a try again after speaking with Dr. Mike Gip and being given no other viable options.

## 2017-12-12 NOTE — Telephone Encounter (Signed)
Oral Oncology Patient Advocate Encounter  Received notification from [place] Patient Assistance program that patient has been successfully enrolled into their program to receive Eliquis from the manufacturer at $0 out of pocket until 12/10/2018.   I called and spoke with patient.  He knows we will have to re-apply.   Patient knows to call the office with questions or concerns.  [facility]cology [facility] will continue to follow.  [facility] Patient Advocate [phone] 12/12/2017 9:13 AM

## 2017-12-13 ENCOUNTER — Inpatient Hospital Stay: Payer: Medicare Other

## 2017-12-13 ENCOUNTER — Other Ambulatory Visit: Payer: Self-pay

## 2017-12-13 ENCOUNTER — Inpatient Hospital Stay: Payer: Medicare Other | Attending: Hematology and Oncology | Admitting: Hematology and Oncology

## 2017-12-13 ENCOUNTER — Encounter: Payer: Self-pay | Admitting: Hematology and Oncology

## 2017-12-13 ENCOUNTER — Telehealth: Payer: Self-pay | Admitting: Pharmacist

## 2017-12-13 VITALS — BP 133/83 | HR 89 | Temp 95.7°F | Resp 20 | Wt 133.4 lb

## 2017-12-13 DIAGNOSIS — G893 Neoplasm related pain (acute) (chronic): Secondary | ICD-10-CM | POA: Diagnosis not present

## 2017-12-13 DIAGNOSIS — Z86718 Personal history of other venous thrombosis and embolism: Secondary | ICD-10-CM | POA: Diagnosis not present

## 2017-12-13 DIAGNOSIS — Z7901 Long term (current) use of anticoagulants: Secondary | ICD-10-CM | POA: Diagnosis not present

## 2017-12-13 DIAGNOSIS — R945 Abnormal results of liver function studies: Secondary | ICD-10-CM

## 2017-12-13 DIAGNOSIS — C220 Liver cell carcinoma: Secondary | ICD-10-CM

## 2017-12-13 DIAGNOSIS — I2699 Other pulmonary embolism without acute cor pulmonale: Secondary | ICD-10-CM

## 2017-12-13 DIAGNOSIS — C3411 Malignant neoplasm of upper lobe, right bronchus or lung: Secondary | ICD-10-CM

## 2017-12-13 DIAGNOSIS — I81 Portal vein thrombosis: Secondary | ICD-10-CM

## 2017-12-13 DIAGNOSIS — E43 Unspecified severe protein-calorie malnutrition: Secondary | ICD-10-CM | POA: Diagnosis not present

## 2017-12-13 DIAGNOSIS — I820 Budd-Chiari syndrome: Secondary | ICD-10-CM

## 2017-12-13 DIAGNOSIS — Z7189 Other specified counseling: Secondary | ICD-10-CM

## 2017-12-13 LAB — CBC WITH DIFFERENTIAL/PLATELET
Basophils Absolute: 0 10*3/uL (ref 0–0.1)
Basophils Relative: 0 %
Eosinophils Absolute: 0.1 10*3/uL (ref 0–0.7)
Eosinophils Relative: 2 %
HCT: 45 % (ref 40.0–52.0)
Hemoglobin: 14.9 g/dL (ref 13.0–18.0)
Lymphocytes Relative: 11 %
Lymphs Abs: 0.6 10*3/uL — ABNORMAL LOW (ref 1.0–3.6)
MCH: 33.2 pg (ref 26.0–34.0)
MCHC: 33.2 g/dL (ref 32.0–36.0)
MCV: 100.1 fL — ABNORMAL HIGH (ref 80.0–100.0)
Monocytes Absolute: 0.7 10*3/uL (ref 0.2–1.0)
Monocytes Relative: 12 %
Neutro Abs: 4 10*3/uL (ref 1.4–6.5)
Neutrophils Relative %: 75 %
Platelets: 234 10*3/uL (ref 150–440)
RBC: 4.5 MIL/uL (ref 4.40–5.90)
RDW: 18.9 % — ABNORMAL HIGH (ref 11.5–14.5)
WBC: 5.4 10*3/uL (ref 3.8–10.6)

## 2017-12-13 LAB — COMPREHENSIVE METABOLIC PANEL
ALT: 45 U/L (ref 17–63)
AST: 142 U/L — ABNORMAL HIGH (ref 15–41)
Albumin: 2 g/dL — ABNORMAL LOW (ref 3.5–5.0)
Alkaline Phosphatase: 168 U/L — ABNORMAL HIGH (ref 38–126)
Anion gap: 5 (ref 5–15)
BUN: 13 mg/dL (ref 6–20)
CO2: 25 mmol/L (ref 22–32)
Calcium: 7.7 mg/dL — ABNORMAL LOW (ref 8.9–10.3)
Chloride: 104 mmol/L (ref 101–111)
Creatinine, Ser: 0.63 mg/dL (ref 0.61–1.24)
GFR calc Af Amer: >60 mL/min (ref >60)
GFR calc non Af Amer: >60 mL/min (ref >60)
Glucose, Bld: 122 mg/dL — ABNORMAL HIGH (ref 65–99)
Potassium: 3.8 mmol/L (ref 3.5–5.1)
Sodium: 134 mmol/L — ABNORMAL LOW (ref 135–145)
Total Bilirubin: 4.1 mg/dL — ABNORMAL HIGH (ref 0.3–1.2)
Total Protein: 7.8 g/dL (ref 6.5–8.1)

## 2017-12-13 MED ORDER — OXYCODONE HCL 5 MG PO TABS: ORAL_TABLET | ORAL | 0 refills | Status: DC

## 2017-12-13 MED ORDER — LENVATINIB (8 MG DAILY DOSE) 2 X 4 MG PO CPPK: 8.0000 mg | ORAL_CAPSULE | Freq: Every day | ORAL | 2 refills | Status: AC

## 2017-12-13 NOTE — Progress Notes (Signed)
[facility] day:  12/13/2017   Chief Complaint: Gerald [name] is a 67 y.o. male with stage IV adenocarcinoma of the lung and hepatocellular carcinoma on lenvatinib who is seen for 1 week assessment.  HPI:  The patient was last seen by me in the medical oncology clinic on 12/06/2017.  At that time,  he remained fatigued.  He was trying to eat better. His weight was stable. He had abdominal tenderness mainly over the RIGHT upper quadrant. He was jaundiced with scleral icterus. WBC was 10,700 with an Lilly of 8400. Hemoglobin was 13.9, hematocrit 41.3,  platelets 255,000.  Total bilirubin was 6.8    Child-Pugh score was 10 (class C).  We discussed discontinuation of Eliquis and reinstitution of Lovenox for management of his PE.  After some discussion, he was agreeable.  His elevated bilirubin was felt secondary to his [place].  He wished to continue lenvatinib (he was taking sporadically).   Pain was controlled.  We discussed Hospice.  He declined.  During the interim, patient is doing "about the same". Patient continues to complain of marked fatigue. He states, "I am doing better about my meds. I am taking my lenvatinib like I should".  He has a poor appetite. He states, "I don't have enough energy sometime to get up and fix food". Patient continues to lose weight. Patient has lost 2 pounds. He is using supplement shakes "some". Patient is using the prescribed Enoxaparin as prescribed. He notes areas of bruising to his bilateral upper extremities, however the bruised areas area explainable.   Pain is rated at a 3/10 today. He notes that this pain is "pretty well controlled" with the prescribed interventions.    Past Medical History:  Diagnosis Date  . Arthritis    Rheumatoid arthritis  . Cancer ([place])    lung ca   . Collagen vascular disease (H[facility])    RA  . Dyspnea   . Hepatitis    b and c  . Mass of lung   . Pneumonia   . Pneumothorax    spontaneous  .  Renal disorder    as a child    Past Surgical History:  Procedure Laterality Date  . FLEXIBLE BRONCHOSCOPY N/A 03/30/2016   Procedure: FLEXIBLE BRONCHOSCOPY;  Surgeon: Vilinda Boehringer, MD;  Location: [facility] ORS;  Service: Cardiopulmonary;  Laterality: N/A;  . IR ANGIOGRAM SELECTIVE EACH ADDITIONAL VESSEL  07/24/2017  . IR ANGIOGRAM SELECTIVE EACH ADDITIONAL VESSEL  07/24/2017  . IR ANGIOGRAM SELECTIVE EACH ADDITIONAL VESSEL  07/24/2017  . IR ANGIOGRAM SELECTIVE EACH ADDITIONAL VESSEL  07/24/2017  . IR ANGIOGRAM SELECTIVE EACH ADDITIONAL VESSEL  07/24/2017  . IR ANGIOGRAM SELECTIVE EACH ADDITIONAL VESSEL  07/24/2017  . IR ANGIOGRAM SELECTIVE EACH ADDITIONAL VESSEL  07/24/2017  . IR ANGIOGRAM SELECTIVE EACH ADDITIONAL VESSEL  08/20/2017  . IR ANGIOGRAM VISCERAL SELECTIVE  07/24/2017  . IR ANGIOGRAM VISCERAL SELECTIVE  08/20/2017  . IR EMBO ARTERIAL NOT HEMORR HEMANG INC GUIDE ROADMAPPING  07/24/2017  . IR EMBO TUMOR ORGAN ISCHEMIA INFARCT INC GUIDE ROADMAPPING  08/20/2017  . IR RADIOLOGIST EVAL & MGMT  07/11/2017  . IR US GUIDE VASC ACCESS RIGHT  07/24/2017  . Ureter repair as a child      Family History  Problem Relation Age of Onset  . Brain cancer Mother   . Lung cancer Maternal Uncle   . Cancer Sister        Metastatic cancer- unknown primary  Social History:  reports that he quit smoking about 17 years ago. His smoking use included cigarettes. He has a 13.00 pack-year smoking history. he has never used smokeless tobacco. He reports that he drinks alcohol. He reports that he does not use drugs.  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  He lives in [place].  Contact number is [phone].  He has a great pyrenees.  The patient is alone today.  Allergies:  Allergies  Allergen Reactions  . Nsaids Other (See Comments)    Can take NSAIDs short term but cannot be on it long term d/t renal insuff  . Sulfa Antibiotics Other (See Comments)     Reaction:  GI upset     Current Medications: Current Outpatient Medications  Medication Sig Dispense Refill  . Calcium Carb-Cholecalciferol (CALCIUM-VITAMIN D) 500-200 MG-UNIT tablet Take 1 tablet by mouth 2 (two) times daily.    Marland Kitchen enoxaparin (LOVENOX) 60 MG/0.6ML injection Inject 0.6 mLs (60 mg total) into the skin every 12 (twelve) hours. (Patient taking differently: Inject 60 mg into the skin every 12 (twelve) hours. ) 36 mL 1  . lenvatinib 8 mg daily dose (LENVIMA) 4 (2) MG capsule Take 2 capsules (8 mg total) by mouth daily. 60 capsule 0  . oxyCODONE (OXY IR/ROXICODONE) 5 MG immediate release tablet Take 1-2 tablets every 4 hours as needed for pain 40 tablet 0  . ondansetron (ZOFRAN) 4 MG tablet Take 1 tablet (4 mg total) every 8 (eight) hours as needed by mouth for nausea or vomiting. (Patient not taking: Reported on 12/06/2017) 30 tablet 0  . protein supplement shake (PREMIER PROTEIN) LIQD Take 325 mLs (11 oz total) by mouth 2 (two) times daily between meals. (Patient not taking: Reported on 11/30/2017) 19500 mL 1   No current facility-administered medications for this visit.     Review of Systems:  GENERAL:  Feels "about the same".  Fatigued.  No fever, chills or sweats.  Weight down 2 pounds. PERFORMANCE STATUS (ECOG):  2 HEENT: No visual changes, sore throat, mouth sores or tenderness. Lungs:  Chronic shortness of breath.  No cough. No hemoptysis. Cardiac:  No chest pain, palpitations, orthopnea, or PND.  Sleeps in a recliner. GI:  Appetite poor.  Eating and drinking "some" (see HPI).  Doesn't like Boost.  Nausea. No vomiting, diarrhea, constipation, melena or hematochezia. GU:  No urgency, frequency, dysuria, or hematuria. Musculoskeletal:  Chronic right knee pain. No back pain.  No muscle tenderness. Extremities:  No pain or swelling. Skin:  Bruising, explained.  No rashes or ulcers. Neuro:  Neuropathy (stable) on gabapentin.  No headache, focal weakness, balance or  coordination issues. Endocrine:  No diabetes, thyroid issues, hot flashes or night sweats. Psych:  No mood changes, depression or anxiety. Pain:  3 out of 10 - abdomen  Review of systems:  All other systems reviewed and found to be negative.  Physical Exam: Blood pressure 133/83, pulse 89, temperature (!) 95.7 F (35.4 C), resp. rate 20, weight 133 lb 6.4 oz (60.5 kg). GENERAL:  Thin fatigued appearing gentleman sitting comfortably in the exam room in no acute distress. MENTAL STATUS:  Alert and oriented to person, place and time.  HEAD:Wearing a cap. Short gray hair. Thin gray beard. Temporal wasting. Normocephalic, atraumatic, face symmetric, no Cushingoid features. EYES:Blue eyes. Scleral icterus.  Pupils equal round and reactive to light and accomodation. No conjunctivitis. ZOX:[id number] clear without lesion. Tonguenormal. Mucous  membranes dry. RESPIRATORY:Clear to auscultationwithout rales, wheezes or rhonchi. CARDIOVASCULAR:Regular rate andrhythmwithout murmur, rub or gallop. ABDOMEN:Soft, slightly tender over liver.  No guarding or rebound tenderness.  Active bowel sounds and no splenomegaly. No masses. SKIN: Jaundiced.  Right upper extremity ecchymosis.  Dry.  No rashes. EXTREMITIES: No edema, no skin discoloration or tenderness. No palpable cords. LYMPHNODES: No palpable cervical, supraclavicular, axillary or inguinal adenopathy  NEUROLOGICAL: Unremarkable. PSYCH: Appropriate.    Imaging studies: 03/27/2016:  Chest CT angiogram revealed an 8 cm mass in the medial right upper lobe. The mass abutted the mediastinum and possibly invaded the left atrium. There was associated widespread thoracic adenopathy. There was a 2.6 cm enhancing lesion in the medial segment of the left hepatic lobe. 04/05/2016:  PET scan revealed intense hypermetabolism (SUV 17.95) associated with a 7.5 cm central perihilar right lung lesion. There was mediastinal (sub-carinal node  SUV 8.89; 2.6 cm right paratracheal node SUV 17.49) and upper abdominal retroperitoneal hypermetabolic adenopathy (9 mm aortocaval node SUV 9.2). There were 2 liver metastasis (2.3 cm left lobe SUV 6.19; 2.2 cm segment VIII SUV 5.18). There are multifocal bone metastasis (C2 vertebral body SUV 9.0; 1.6 cm left sacral ala lesion SUV of 8.6).  03/31/2016:  Head MRI was indeterminate for early metastatic disease to the brain. There was a small focus of gyral enhancement in the anterior right frontal lobe likely is post ischemic, while a small posterior right cerebellar enhancing lesion was more suspicious for small brain metastasis. There were multiple small primarily subacute appearing infarcts in the bilateral MCA and left PICA territories.  04/07/2016:  Bone scan revealed a subtle distal right femur lesion which might be a small metastasis.  Bone scan was felt to be an unreliable modality for evaluation of osseous metastatic disease as the bone metastases seen on the recent PET and MRI were not evident. 05/12/2016:  Head MRI revealed expected interval evolution of bilateral cerebral in cerebellar infarcts. There was resolution of right frontal and right cerebellar enhancement consistent with subacute infarcts. There was a new 3 mm focus of gyral enhancement in the left occipital lobe and a possible new 4 mm enhancing lesion in the left cerebellum (? vascular enhancement versus tiny metastasis).  08/16/2016:  Head MRI revealed multiple areas of improving enhancement and restricted diffusion compatible with resolving subacute infarcts.  There was no evidence of metastatic disease or acute infarction  10/19/2016:  Chest, abdomen, and pelvic CT scan revealed radiation changes in the right lung.  The superimposed right lung pneumonia had improved.  The 6.4 x 2.4 cm right perihilar mass had decreased.  There was improving thoracic lymphadenopathy.  There was progression of hepatic metastases, measuring up to  4.7 cm.  Specifically, there was a 3.9 x 3.7 cm metastasis in segment 8 (previously 2.5 x 2.8 cm) and a 4.7 x 4.0 cm metastasis in segment 4B (previously 3.1 x 2.6 cm). 11/14/2016:  PET scan revealed 2 liver masses. Both were significantly more hypermetabolic than on 58/52/7782 and have enlarged compared to the prior PET-CT.  The segment 8 lesion was centrally necrotic but with high activity along its periphery (7.4), and about the same size as it was on 10/19/2016. The lateral segment left hepatic lobe lesion had enlarged (5.9 x 5.3 cm) compared to 10/19/16 (4.1 x 4.4 cm).  The bony metastatic lesions were no longer hypermetabolic and have resolved.  There were post therapy and postoperative findings in the right lung, with a considerable amount of suspected radiation pneumonitis  and radiation fibrosis. 12/29/2016:  Chest, abdomen, and pelvic CT revealed mild reduction in size and central necrosis of the hepatic metastatic lesions.  There was continued post therapy related findings along the right hilar region. Dominant right perihilar mass was reduced in thickness compared to 10/19/2016.  04/13/2017:  Chest, abdomen, and pelvic CT revealed new left hepatic vein thrombosis. This somewhat obscured the known left hepatic lobe tumor although overall the left hepatic lobe masses were thought to be similar in size compared the prior exam. The segment 8 lesion was less well seen than on the prior exam.  The central necrotic portion was seen but the peripheral rim was much less conspicuous (could represent improvement in this tumor).  There was mild increase in adenopathy in the gastrohepatic ligament.  There was essentially stable appearance in the lungs with considerable radiation pneumonitis, scarring, and volume loss in the right lung, along with a trace loculated pleural effusion with enhancing margins.  There was stable appearance of prior splenic infarcts and prior scarring in the left mid kidney. 07/02/2017:   Chest, abdomen, and pelvic CT revealed interval increase in the left hepatic vein thrombosis with progressive heterogeneity and enhancement of the left hepatic lobe, predominantly obscuring the known left hepatic lobe lesion. There was a possible new 1.5 cm lateral left hepatic lobe lesion versus focal area of heterogeneity. There was slight interval decrease in the size of the irregular low-attenuation lesion within segment 8 of the liver. Right lung was stable in appearance with interval development of consolidation within the lingula representing atelectasis or infection. 07/05/2017:  Abdominal MRI revealed persistent mass within lateral segment of left lobe of liver with progressive thrombosis of the left portal vein. There was also persistent thrombosis of the left hepatic vein. Portal vein thrombus extended up to the level of the main portal vein and was likely tumor thrombus. The liver has morphologic features suggestive of early cirrhosis. All findings are highly suggestive that the lesion in the left lobe of liver represents hepatoma rather than lung cancer metastasis. The lesion within segment 8 of the liver had decreased in size when compared with 04/13/2017.  The small lesion within segment 7 of the liver appeared new from previous exam. Indeterminate. This may represent an area of lung cancer metastasis or possibly multifocal [place] 09/17/2017:  PET scan revealed the original 2 lesions shown on the prior exam are less cohesivly visible, there were new spreading confluent lesions in segments 1, 2, 3, 4, 5, and 8 of the liver compatible with progressive multifocal hepatic malignancy. 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  There was post therapy related findings in the lungs without active pulmonary or osseous metastatic disease identified. 11/26/2017:  Chest, abdomen, and pelvic CT revealed development of bilateral pulmonary nodules, consistent with  metastatic disease.  There was moderate volume left-sided pulmonary emboli.  There was similar radiation change within the medial right lung.  There was increased tiny left and new trace right pleural effusion.  There was progression of hepatic metastasis, especially when compared to the prior diagnostic CT of 07/02/2017.  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  There was right-sided colonic wall thickening is most likely due to portal venous hypertension and portal vein thrombus.  Concurrent infectious colitis could not be excluded.  There was new small volume abdominopelvic ascites.  There was developing high left hepatic lobe intrahepatic biliary duct  dilatation.    Appointment on 12/13/2017  Component Date Value Ref Range Status  . Sodium 12/13/2017 134* 135 - 145 mmol/L Final  . Potassium 12/13/2017 3.8  3.5 - 5.1 mmol/L Final  . Chloride 12/13/2017 104  101 - 111 mmol/L Final  . CO2 12/13/2017 25  22 - 32 mmol/L Final  . Glucose, Bld 12/13/2017 122* 65 - 99 mg/dL Final  . BUN 12/13/2017 13  6 - 20 mg/dL Final  . Creatinine, Ser 12/13/2017 0.63  0.61 - 1.24 mg/dL Final  . Calcium 12/13/2017 7.7* 8.9 - 10.3 mg/dL Final  . Total Protein 12/13/2017 7.8  6.5 - 8.1 g/dL Final  . Albumin 12/13/2017 2.0* 3.5 - 5.0 g/dL Final  . AST 12/13/2017 142* 15 - 41 U/L Final  . ALT 12/13/2017 45  17 - 63 U/L Final  . Alkaline Phosphatase 12/13/2017 168* 38 - 126 U/L Final  . Total Bilirubin 12/13/2017 4.1* 0.3 - 1.2 mg/dL Final  . GFR calc non Af Amer 12/13/2017 >60  >60 mL/min Final  . GFR calc Af Amer 12/13/2017 >60  >60 mL/min Final   Comment: (NOTE) The eGFR has been calculated using the CKD EPI equation. This calculation has not been validated in all clinical situations. eGFR's persistently <60 mL/min signify possible Chronic Kidney Disease.   Georgiann Hahn gap 12/13/2017 5  5 - 15 Final   Performed at [facility], [address] [place], [place] [address]  . WBC 12/13/2017 5.4  3.8 - 10.6 K/uL Final  . RBC 12/13/2017 4.50  4.40 - 5.90 MIL/uL Final  . Hemoglobin 12/13/2017 14.9  13.0 - 18.0 g/dL Final  . HCT 12/13/2017 45.0  40.0 - 52.0 % Final  . MCV 12/13/2017 100.1* 80.0 - 100.0 fL Final  . MCH 12/13/2017 33.2  26.0 - 34.0 pg Final  . MCHC 12/13/2017 33.2  32.0 - 36.0 g/dL Final  . RDW 12/13/2017 18.9* 11.5 - 14.5 % Final  . Platelets 12/13/2017 234  150 - 440 K/uL Final  . Neutrophils Relative % 12/13/2017 75  % Final  . Neutro Abs 12/13/2017 4.0  1.4 - 6.5 K/uL Final  . Lymphocytes Relative 12/13/2017 11  % Final  . Lymphs Abs 12/13/2017 0.6* 1.0 - 3.6 K/uL Final  . Monocytes Relative 12/13/2017 12  % Final  . Monocytes Absolute 12/13/2017 0.7  0.2 - 1.0 K/uL Final  . Eosinophils Relative 12/13/2017 2  % Final  . Eosinophils Absolute 12/13/2017 0.1  0 - 0.7 K/uL Final  . Basophils Relative 12/13/2017 0  % Final  . Basophils Absolute 12/13/2017 0.0  0 - 0.1 K/uL Final   Performed at [facility], [address] [place],  [address]    Assessment:  Shana Younge is a 67 y.o. male with stage IV adenocarcinoma of the lung and associated leukocytosis with eosinophilia. He presented with a 30 pound weight loss, shortness of breath, cough, and left leg discomfort. He has a 13-15 pack year smoking history.  Bronchoscopy on 03/30/2016 revealed an endobronchial lesion in the RUL anterior segment with 95% occlusion. There was extrinsic compression in the right middle lobe secondary to posterior mass effect. Pathology confirmed non-small cell lung cancer, favor adenocarcinoma.  PD-L1 testing revealed high expression (> 50%).  EGFR, ALK, and ROS1 were negative.  CEA was 2.3 on 03/27/2016.  PET scan on 04/05/2016 revealed intense hypermetabolism (SUV 17.95) associated with a 7.5 cm central perihilar right lung lesion. There was mediastinal (sub-carinal node  SUV 8.89; 2.6 cm right paratracheal  node SUV 17.49) and upper abdominal retroperitoneal hypermetabolic adenopathy (9 mm aortocaval node SUV 9.2). There were 2 liver metastasis (2.3 cm left lobe SUV 6.19; 2.2 cm segment VIII SUV 5.18). There are multifocal bone metastasis (C2 vertebral body SUV 9.0; 1.6 cm left sacral ala lesion SUV of 8.6).   He had marked leukocytosis with hypereosinophilia. Initial WBC was 72,300 with 53% eosinophils. Bone marrow on 03/29/2016 revealed marked increase in marrow eosinophils (approximate 30%). There was no diagnostic morphologic evidence of a myeloproliferative or lymphoid neoplasm. Flow cytometry revealed significant increase in eosinophils (54%). There was relative decreased myeloid cells with no significant immunophenotypic abnormalities or increase in blasts. There was no lymphoid abnormalities or evidence of clonality. FISH studies were negative for myeloproliferative neoplasms with eosinophilia (PDGFRA, PDGFRB, FGFR1). BCR-ABL was negative on 03/28/2016.  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  Left lower extremity duplex on 03/25/2016 revealed no evidence of DVT. Discomfort improved on Neurontin 300 mg a day and continues to improve with declining eosinophilia.  He received 41.4 Gy from 04/11/2016 - 05/23/2016.  He received 4 weeks of concurrent carboplatin and Taxol (04/17/2016 - 05/22/2016).    He has received 15 cycles of Keytruda (07/03/2016 - 10/12/2016; 11/22/2016 - 06/05/2017).  He has tolerated treatment well.  He receives [place] every 6 weeks (began 05/22/2016; last 06/26/2017).  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  He was on Xarelto from 04/13/2017 - 07/24/2017. Lovenox injections were not started on 07/13/2017 as ordered; ordered again to start 07/25/2017.  He is taking Lovenox.  He has increased liver function tests.  Etiology is possibly secondary to acute hepatitis, irritation due to  thrombosis, or progressive disease (metastatic liver lesion or [place]).  He is hepatitis B core antibody and hepatitis C antibody positive.  Hepatitis C RNA was 5.474 log10 IU/ml (298,000 IU/ml) on 07/03/2017.  Hepatitis C genotype is 1a.  AFP was 589.7 on 07/03/2017.  Chest, abdomen, and pelvic CT on 07/02/2017 revealed interval increase in the left hepatic vein thrombosis with progressive heterogeneity and enhancement of the left hepatic lobe, predominantly obscuring the known left hepatic lobe lesion. There was a possible new 1.5 cm lateral left hepatic lobe lesion versus focal area of heterogeneity. There was slight interval decrease in the size of the irregular low-attenuation lesion within segment 8 of the liver. Right lung was stable in appearance with interval development of consolidation within the lingula representing atelectasis or infection.  Abdominal MRI on 07/05/2017 revealed persistent mass within lateral segment of left lobe of liver with progressive thrombosis of the left portal vein. There was also persistent thrombosis of the left hepatic vein. Portal vein thrombus extended up to the level of the main portal vein and was likely tumor thrombus. The liver has morphologic features suggestive of early cirrhosis. All findings are highly suggestive that the lesion in the left lobe of liver represents hepatoma rather than lung cancer metastasis. The lesion within segment 8 of the liver had decreased in size when compared with 04/13/2017.  The small lesion within segment 7 of the liver appeared new from previous exam. Indeterminate. This may represent an area of lung cancer metastasis or possibly multifocal HCC.  Ultrasound guided liver biopsy on 07/18/2017 revealed carcinoma c/w hepatocellular carcinoma.   He underwent arteriography, embolization and shunt calculations on 07/24/2017 in preparation for Y-90 radioembolization of his liver.  He underwent left accessory hepatic  arteriography with  chemotherapy embolization of accessory hepatic artery with doxorubicin-eluting beads on 08/20/2017. Tumor was targeted in the lateral segment of the left lobe of the liver.  AFP has been followed: 589.7 on 07/03/2017, 819.2 on 08/20/2017, 475.6 on 09/07/2017, 1188 on 10/29/2017, and 1457 on 11/23/2017.  PET scan on 09/17/2017 revealed the original 2 lesions shown on the prior exam are less cohesivly visible, there were new spreading confluent lesions in segments 1, 2, 3, 4, 5, and 8 of the liver compatible with progressive multifocal hepatic malignancy. 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  There was post therapy related findings in the lungs without active pulmonary or osseous metastatic disease identified.  Chest, abdomen, and pelvic CT on 11/26/2017 revealed development of bilateral pulmonary nodules, consistent with metastatic disease.  There was moderate volume left-sided pulmonary emboli.  There was increased tiny left and new trace right pleural effusion.  There was progression of hepatic metastasis, when compared to the prior diagnostic CT of 07/02/2017.  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  There was right-sided colonic wall thickening is most likely due to portal venous hypertension and portal vein thrombus. There was new small volume abdominopelvic ascites.  There was developing high left hepatic lobe intrahepatic biliary duct dilatation.  He began lenvatinib on 10/16/2017.  He is taking it more consistently.  Code status is DNR/DNI.  Symptomatically, he remains fatigued.  His weight has decreased by 2 pounds. Exam reveals abdominal tenderness; mainly over RIGHT upper quadrant. WBC is 4400 with ANC 4000. Hemoglobin is 14.9, hematocrit 45.0, and platelets 234,000. Albumin is low at 2.0. Corrected calcium is normal (9.3).  LFTs have improved. AST 142, ALT 45, alkaline  phosphatase 168, and total bilirubin 4.1.  Plan: 1.  Labs today:  CBC with diff, CMP. 2.  Discuss hepatic function. Total bilirubin is decreased to 4.1 (previously 6.8). Discuss elevation related to his [place]. Patient to continue lenvatinib. Patient almost out of his medication. Spoke with oral chemo pharmacy and learned that patient's annual application for [id number] is still in process. Pharmacist to reach out to drug representative to see if we can obtain samples that will ensure that patient does not go without his medication while waiting for his financial assistance application to process.  3.  Discuss anticoagulation. Patient using prescribed Lovenox (Enoxaparin) as directed. Continue.  4.  Discuss pain control. Patient is taking the prescribed Oxycodone. He notes that this intervention is adequate in managing his pain. Refill Rx: Oxycodone IR 32m q4 PRN (Disp #40), 5.  Discuss weight. Patient encouraged to increase calorie and protein intake as much as possible. Patient to use supplement shakes BID to TID. Samples provided.  6.  RTC in 2 weeks for MD assessment and labs (CBC with diff, CMP).   BHonor Loh NP  12/13/2017, 4:48 PM   I saw and evaluated the patient, participating in the key portions of the service and reviewing pertinent diagnostic studies and records.  I reviewed the nurse practitioner's note and agree with the findings and the plan.  The assessment and plan were discussed with the patient.  Several questions were asked by the patient and answered.   MLequita Asal MD 12/13/2017, 4:48 PM

## 2017-12-13 NOTE — Telephone Encounter (Addendum)
Oral Chemotherapy Pharmacist Encounter  Notified by Gaspar Bidding NP and Dr. Mike Gip that patient needs refill of his [place]. Paperwork was sent to re-enroll him in manufacturer assistance at the beginning of December.  Spoke with the [employer]. They will be able to get him a fill this month but he will need to turn in his 2019 social security reward letter.  Medication is dispensed by [facility]. Called to see if I could set-up his medication shipment. [phone]). They have not yet received a fax from the manufacturer stating he was approved it usually takes up to 24hr. Instructed to call back tomorrow. They did stated they needed a new Rx for him. Rx sent in. Will check on status tomorrow. If all information is in tomorrow, they medication could be to [name] by Saturday 12/15/17.   Darl Pikes, PharmD, BCPS Hematology/Oncology Clinical Pharmacist [facility] [phone]  12/13/2017 5:26 PM

## 2017-12-13 NOTE — Progress Notes (Signed)
Here for follow up

## 2017-12-14 ENCOUNTER — Telehealth: Payer: Self-pay | Admitting: Hematology and Oncology

## 2017-12-14 NOTE — Telephone Encounter (Signed)
Oral Oncology Patient Advocate Encounter   Called patient to let him know that I need his letter of award from [employer]. He needs to bring at his appointment 12/2017. I need to send to manufacture for [place].   [place] Patient Advocate [phone] 12/14/2017 10:24 AM

## 2017-12-14 NOTE — Telephone Encounter (Signed)
Oral Oncology Patient Advocate Encounter  [facility] to check on patients [name] and it will be shipped today patient should get Saturday 12/15/2017.   [place] Patient Advocate [phone] 12/14/2017 1:56 PM

## 2017-12-27 ENCOUNTER — Inpatient Hospital Stay: Payer: Medicare Other

## 2017-12-27 ENCOUNTER — Inpatient Hospital Stay: Payer: Medicare Other | Admitting: Hematology and Oncology

## 2017-12-27 NOTE — Progress Notes (Deleted)
[facility] day:  12/27/2017   Chief Complaint: Gerald [name] is a 67 y.o. male with stage IV adenocarcinoma of the lung and hepatocellular carcinoma on lenvatinib who is seen for 2 week assessment.  HPI:  The patient was last seen by me in the medical oncology clinic on 12/13/2017.  At that time,  he remained fatigued.  Weight was down 2 pounds. Exam revealed abdominal tenderness; mainly over RIGHT upper quadrant. Counts were good.  Albumin was low at 2.0. LFTs had improved. AST 142, ALT 45, alkaline phosphatase 168, and total bilirubin 4.1.  Pain was well controlled.  He was taking lenvatinib more consistently.  He wished to continue treatment.  He was taking Lovenox for portal, hepatic, IVC thrombosis, and pulmonary emboli.  During the interim,    Past Medical History:  Diagnosis Date  . Arthritis    Rheumatoid arthritis  . Cancer ([place])    lung ca   . Collagen vascular disease ([facility])    RA  . Dyspnea   . Hepatitis    b and c  . Mass of lung   . Pneumonia   . Pneumothorax    spontaneous  . Renal disorder    as a child    Past Surgical History:  Procedure Laterality Date  . FLEXIBLE BRONCHOSCOPY N/A 03/30/2016   Procedure: FLEXIBLE BRONCHOSCOPY;  Surgeon: Vilinda Boehringer, MD;  Location: [facility] ORS;  Service: Cardiopulmonary;  Laterality: N/A;  . IR ANGIOGRAM SELECTIVE EACH ADDITIONAL VESSEL  07/24/2017  . IR ANGIOGRAM SELECTIVE EACH ADDITIONAL VESSEL  07/24/2017  . IR ANGIOGRAM SELECTIVE EACH ADDITIONAL VESSEL  07/24/2017  . IR ANGIOGRAM SELECTIVE EACH ADDITIONAL VESSEL  07/24/2017  . IR ANGIOGRAM SELECTIVE EACH ADDITIONAL VESSEL  07/24/2017  . IR ANGIOGRAM SELECTIVE EACH ADDITIONAL VESSEL  07/24/2017  . IR ANGIOGRAM SELECTIVE EACH ADDITIONAL VESSEL  07/24/2017  . IR ANGIOGRAM SELECTIVE EACH ADDITIONAL VESSEL  08/20/2017  . IR ANGIOGRAM VISCERAL SELECTIVE  07/24/2017  . IR ANGIOGRAM VISCERAL SELECTIVE  08/20/2017  . IR EMBO ARTERIAL NOT  HEMORR HEMANG INC GUIDE ROADMAPPING  07/24/2017  . IR EMBO TUMOR ORGAN ISCHEMIA INFARCT INC GUIDE ROADMAPPING  08/20/2017  . IR RADIOLOGIST EVAL & MGMT  07/11/2017  . IR US GUIDE VASC ACCESS RIGHT  07/24/2017  . Ureter repair as a child      Family History  Problem Relation Age of Onset  . Brain cancer Mother   . Lung cancer Maternal Uncle   . Cancer Sister        Metastatic cancer- unknown primary    Social History:  reports that he quit smoking about 17 years ago. His smoking use included cigarettes. He has a 13.00 pack-year smoking history. he has never used smokeless tobacco. He reports that he drinks alcohol. He reports that he does not use drugs.  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  He lives in [place].  Contact number is [phone].  He has a great pyrenees.  The patient is alone today.  Allergies:  Allergies  Allergen Reactions  . Nsaids Other (See Comments)    Can take NSAIDs short term but cannot be on it long term d/t renal insuff  . Sulfa Antibiotics Other (See Comments)    Reaction:  GI upset     Current Medications: Current Outpatient Medications  Medication Sig Dispense Refill  . Calcium Carb-Cholecalciferol (CALCIUM-VITAMIN D) 500-200 MG-UNIT  tablet Take 1 tablet by mouth 2 (two) times daily.    Marland Kitchen enoxaparin (LOVENOX) 60 MG/0.6ML injection Inject 0.6 mLs (60 mg total) into the skin every 12 (twelve) hours. (Patient taking differently: Inject 60 mg into the skin every 12 (twelve) hours. ) 36 mL 1  . lenvatinib 8 mg daily dose (LENVIMA) 4 (2) MG capsule Take 2 capsules (8 mg total) by mouth daily. 60 capsule 2  . ondansetron (ZOFRAN) 4 MG tablet Take 1 tablet (4 mg total) every 8 (eight) hours as needed by mouth for nausea or vomiting. (Patient not taking: Reported on 12/06/2017) 30 tablet 0  . oxyCODONE (OXY IR/ROXICODONE) 5 MG immediate release tablet Take 1-2 tablets every 4 hours as needed for pain 40 tablet  0  . protein supplement shake (PREMIER PROTEIN) LIQD Take 325 mLs (11 oz total) by mouth 2 (two) times daily between meals. (Patient not taking: Reported on 11/30/2017) 19500 mL 1   No current facility-administered medications for this visit.     Review of Systems:  GENERAL:  Feels "about the same".  Fatigued.  No fever, chills or sweats.  Weight down 2 pounds. PERFORMANCE STATUS (ECOG):  2 HEENT: No visual changes, sore throat, mouth sores or tenderness. Lungs:  Chronic shortness of breath.  No cough. No hemoptysis. Cardiac:  No chest pain, palpitations, orthopnea, or PND.  Sleeps in a recliner. GI:  Appetite poor.  Eating and drinking "some" (see HPI).  Doesn't like Boost.  Nausea. No vomiting, diarrhea, constipation, melena or hematochezia. GU:  No urgency, frequency, dysuria, or hematuria. Musculoskeletal:  Chronic right knee pain. No back pain.  No muscle tenderness. Extremities:  No pain or swelling. Skin:  Bruising, explained.  No rashes or ulcers. Neuro:  Neuropathy (stable) on gabapentin.  No headache, focal weakness, balance or coordination issues. Endocrine:  No diabetes, thyroid issues, hot flashes or night sweats. Psych:  No mood changes, depression or anxiety. Pain:  3 out of 10 - abdomen  Review of systems:  All other systems reviewed and found to be negative.  Physical Exam: There were no vitals taken for this visit. GENERAL:  Thin fatigued appearing gentleman sitting comfortably in the exam room in no acute distress. MENTAL STATUS:  Alert and oriented to person, place and time.  HEAD:Wearing a cap. Short gray hair. Thin gray beard. Temporal wasting. Normocephalic, atraumatic, face symmetric, no Cushingoid features. EYES:Blue eyes. Scleral icterus.  Pupils equal round and reactive to light and accomodation. No conjunctivitis. KDT:OIZTIWPYKD clear without lesion. Tonguenormal. Mucous membranes dry. RESPIRATORY:Clear to auscultationwithout rales, wheezes  or rhonchi. CARDIOVASCULAR:Regular rate andrhythmwithout murmur, rub or gallop. ABDOMEN:Soft, slightly tender over liver.  No guarding or rebound tenderness.  Active bowel sounds and no splenomegaly. No masses. SKIN: Jaundiced.  Right upper extremity ecchymosis.  Dry.  No rashes. EXTREMITIES: No edema, no skin discoloration or tenderness. No palpable cords. LYMPHNODES: No palpable cervical, supraclavicular, axillary or inguinal adenopathy  NEUROLOGICAL: Unremarkable. PSYCH: Appropriate.    Imaging studies: 03/27/2016:  Chest CT angiogram revealed an 8 cm mass in the medial right upper lobe. The mass abutted the mediastinum and possibly invaded the left atrium. There was associated widespread thoracic adenopathy. There was a 2.6 cm enhancing lesion in the medial segment of the left hepatic lobe. 04/05/2016:  PET scan revealed intense hypermetabolism (SUV 17.95) associated with a 7.5 cm central perihilar right lung lesion. There was mediastinal (sub-carinal node SUV 8.89; 2.6 cm right paratracheal node SUV 17.49) and upper abdominal retroperitoneal hypermetabolic adenopathy (  9 mm aortocaval node SUV 9.2). There were 2 liver metastasis (2.3 cm left lobe SUV 6.19; 2.2 cm segment VIII SUV 5.18). There are multifocal bone metastasis (C2 vertebral body SUV 9.0; 1.6 cm left sacral ala lesion SUV of 8.6).  03/31/2016:  Head MRI was indeterminate for early metastatic disease to the brain. There was a small focus of gyral enhancement in the anterior right frontal lobe likely is post ischemic, while a small posterior right cerebellar enhancing lesion was more suspicious for small brain metastasis. There were multiple small primarily subacute appearing infarcts in the bilateral MCA and left PICA territories.  04/07/2016:  Bone scan revealed a subtle distal right femur lesion which might be a small metastasis.  Bone scan was felt to be an unreliable modality for evaluation of osseous metastatic  disease as the bone metastases seen on the recent PET and MRI were not evident. 05/12/2016:  Head MRI revealed expected interval evolution of bilateral cerebral in cerebellar infarcts. There was resolution of right frontal and right cerebellar enhancement consistent with subacute infarcts. There was a new 3 mm focus of gyral enhancement in the left occipital lobe and a possible new 4 mm enhancing lesion in the left cerebellum (? vascular enhancement versus tiny metastasis).  08/16/2016:  Head MRI revealed multiple areas of improving enhancement and restricted diffusion compatible with resolving subacute infarcts.  There was no evidence of metastatic disease or acute infarction  10/19/2016:  Chest, abdomen, and pelvic CT scan revealed radiation changes in the right lung.  The superimposed right lung pneumonia had improved.  The 6.4 x 2.4 cm right perihilar mass had decreased.  There was improving thoracic lymphadenopathy.  There was progression of hepatic metastases, measuring up to 4.7 cm.  Specifically, there was a 3.9 x 3.7 cm metastasis in segment 8 (previously 2.5 x 2.8 cm) and a 4.7 x 4.0 cm metastasis in segment 4B (previously 3.1 x 2.6 cm). 11/14/2016:  PET scan revealed 2 liver masses. Both were significantly more hypermetabolic than on 27/05/2375 and have enlarged compared to the prior PET-CT.  The segment 8 lesion was centrally necrotic but with high activity along its periphery (7.4), and about the same size as it was on 10/19/2016. The lateral segment left hepatic lobe lesion had enlarged (5.9 x 5.3 cm) compared to 10/19/16 (4.1 x 4.4 cm).  The bony metastatic lesions were no longer hypermetabolic and have resolved.  There were post therapy and postoperative findings in the right lung, with a considerable amount of suspected radiation pneumonitis and radiation fibrosis. 12/29/2016:  Chest, abdomen, and pelvic CT revealed mild reduction in size and central necrosis of the hepatic metastatic  lesions.  There was continued post therapy related findings along the right hilar region. Dominant right perihilar mass was reduced in thickness compared to 10/19/2016.  04/13/2017:  Chest, abdomen, and pelvic CT revealed new left hepatic vein thrombosis. This somewhat obscured the known left hepatic lobe tumor although overall the left hepatic lobe masses were thought to be similar in size compared the prior exam. The segment 8 lesion was less well seen than on the prior exam.  The central necrotic portion was seen but the peripheral rim was much less conspicuous (could represent improvement in this tumor).  There was mild increase in adenopathy in the gastrohepatic ligament.  There was essentially stable appearance in the lungs with considerable radiation pneumonitis, scarring, and volume loss in the right lung, along with a trace loculated pleural effusion with enhancing margins.  There  was stable appearance of prior splenic infarcts and prior scarring in the left mid kidney. 07/02/2017:  Chest, abdomen, and pelvic CT revealed interval increase in the left hepatic vein thrombosis with progressive heterogeneity and enhancement of the left hepatic lobe, predominantly obscuring the known left hepatic lobe lesion. There was a possible new 1.5 cm lateral left hepatic lobe lesion versus focal area of heterogeneity. There was slight interval decrease in the size of the irregular low-attenuation lesion within segment 8 of the liver. Right lung was stable in appearance with interval development of consolidation within the lingula representing atelectasis or infection. 07/05/2017:  Abdominal MRI revealed persistent mass within lateral segment of left lobe of liver with progressive thrombosis of the left portal vein. There was also persistent thrombosis of the left hepatic vein. Portal vein thrombus extended up to the level of the main portal vein and was likely tumor thrombus. The liver has morphologic features  suggestive of early cirrhosis. All findings are highly suggestive that the lesion in the left lobe of liver represents hepatoma rather than lung cancer metastasis. The lesion within segment 8 of the liver had decreased in size when compared with 04/13/2017.  The small lesion within segment 7 of the liver appeared new from previous exam. Indeterminate. This may represent an area of lung cancer metastasis or possibly multifocal [place] 09/17/2017:  PET scan revealed the original 2 lesions shown on the prior exam are less cohesivly visible, there were new spreading confluent lesions in segments 1, 2, 3, 4, 5, and 8 of the liver compatible with progressive multifocal hepatic malignancy. 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  There was post therapy related findings in the lungs without active pulmonary or osseous metastatic disease identified. 11/26/2017:  Chest, abdomen, and pelvic CT revealed development of bilateral pulmonary nodules, consistent with metastatic disease.  There was moderate volume left-sided pulmonary emboli.  There was similar radiation change within the medial right lung.  There was increased tiny left and new trace right pleural effusion.  There was progression of hepatic metastasis, especially when compared to the prior diagnostic CT of 07/02/2017.  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  There was right-sided colonic wall thickening is most likely due to portal venous hypertension and portal vein thrombus.  Concurrent infectious colitis could not be excluded.  There was new small volume abdominopelvic ascites.  There was developing high left hepatic lobe intrahepatic biliary duct dilatation.    No visits with results within 3 Day(s) from this visit.  Latest known visit with results is:  Appointment on 12/13/2017  Component Date Value Ref Range Status  . Sodium  12/13/2017 134* 135 - 145 mmol/L Final  . Potassium 12/13/2017 3.8  3.5 - 5.1 mmol/L Final  . Chloride 12/13/2017 104  101 - 111 mmol/L Final  . CO2 12/13/2017 25  22 - 32 mmol/L Final  . Glucose, Bld 12/13/2017 122* 65 - 99 mg/dL Final  . BUN 12/13/2017 13  6 - 20 mg/dL Final  . Creatinine, Ser 12/13/2017 0.63  0.61 - 1.24 mg/dL Final  . Calcium 12/13/2017 7.7* 8.9 - 10.3 mg/dL Final  . Total Protein 12/13/2017 7.8  6.5 - 8.1 g/dL Final  . Albumin 12/13/2017 2.0* 3.5 - 5.0 g/dL Final  . AST 12/13/2017 142* 15 - 41 U/L Final  . ALT 12/13/2017 45  17 - 63 U/L Final  . Alkaline Phosphatase  12/13/2017 168* 38 - 126 U/L Final  . Total Bilirubin 12/13/2017 4.1* 0.3 - 1.2 mg/dL Final  . GFR calc non Af Amer 12/13/2017 >60  >60 mL/min Final  . GFR calc Af Amer 12/13/2017 >60  >60 mL/min Final   Comment: (NOTE) The eGFR has been calculated using the CKD EPI equation. This calculation has not been validated in all clinical situations. eGFR's persistently <60 mL/min signify possible Chronic Kidney Disease.   Georgiann Hahn gap 12/13/2017 5  5 - 15 Final   Performed at [facility], [address] [place], [place] [address]  . WBC 12/13/2017 5.4  3.8 - 10.6 K/uL Final  . RBC 12/13/2017 4.50  4.40 - 5.90 MIL/uL Final  . Hemoglobin 12/13/2017 14.9  13.0 - 18.0 g/dL Final  . HCT 12/13/2017 45.0  40.0 - 52.0 % Final  . MCV 12/13/2017 100.1* 80.0 - 100.0 fL Final  . MCH 12/13/2017 33.2  26.0 - 34.0 pg Final  . MCHC 12/13/2017 33.2  32.0 - 36.0 g/dL Final  . RDW 12/13/2017 18.9* 11.5 - 14.5 % Final  . Platelets 12/13/2017 234  150 - 440 K/uL Final  . Neutrophils Relative % 12/13/2017 75  % Final  . Neutro Abs 12/13/2017 4.0  1.4 - 6.5 K/uL Final  . Lymphocytes Relative 12/13/2017 11  % Final  . Lymphs Abs 12/13/2017 0.6* 1.0 - 3.6 K/uL Final  . Monocytes Relative 12/13/2017 12  % Final  . Monocytes Absolute 12/13/2017 0.7  0.2 - 1.0 K/uL Final  . Eosinophils Relative 12/13/2017 2  % Final  .  Eosinophils Absolute 12/13/2017 0.1  0 - 0.7 K/uL Final  . Basophils Relative 12/13/2017 0  % Final  . Basophils Absolute 12/13/2017 0.0  0 - 0.1 K/uL Final   Performed at [facility], [address] [place], [place] [address]    Assessment:  Calder Oblinger is a 67 y.o. male with stage IV adenocarcinoma of the lung and associated leukocytosis with eosinophilia. He presented with a 30 pound weight loss, shortness of breath, cough, and left leg discomfort. He has a 13-15 pack year smoking history.  Bronchoscopy on 03/30/2016 revealed an endobronchial lesion in the RUL anterior segment with 95% occlusion. There was extrinsic compression in the right middle lobe secondary to posterior mass effect. Pathology confirmed non-small cell lung cancer, favor adenocarcinoma.  PD-L1 testing revealed high expression (> 50%).  EGFR, ALK, and ROS1 were negative.  CEA was 2.3 on 03/27/2016.  PET scan on 04/05/2016 revealed intense hypermetabolism (SUV 17.95) associated with a 7.5 cm central perihilar right lung lesion. There was mediastinal (sub-carinal node SUV 8.89; 2.6 cm right paratracheal node SUV 17.49) and upper abdominal retroperitoneal hypermetabolic adenopathy (9 mm aortocaval node SUV 9.2). There were 2 liver metastasis (2.3 cm left lobe SUV 6.19; 2.2 cm segment VIII SUV 5.18). There are multifocal bone metastasis (C2 vertebral body SUV 9.0; 1.6 cm left sacral ala lesion SUV of 8.6).   He had marked leukocytosis with hypereosinophilia. Initial WBC was 72,300 with 53% eosinophils. Bone marrow on 03/29/2016 revealed marked increase in marrow eosinophils (approximate 30%). There was no diagnostic morphologic evidence of a myeloproliferative or lymphoid neoplasm. Flow cytometry revealed significant increase in eosinophils (54%). There was relative decreased myeloid cells with no significant immunophenotypic abnormalities or increase in blasts. There was no lymphoid abnormalities or evidence of  clonality. FISH studies were negative for myeloproliferative neoplasms with eosinophilia (PDGFRA, PDGFRB, FGFR1). BCR-ABL was negative on 03/28/2016.  He  has left leg discomfort likely secondary to a peripheral neuropathy caused by his eosinophilia.  Left lower extremity duplex on 03/25/2016 revealed no evidence of DVT. Discomfort improved on Neurontin 300 mg a day and continues to improve with declining eosinophilia.  He received 41.4 Gy from 04/11/2016 - 05/23/2016.  He received 4 weeks of concurrent carboplatin and Taxol (04/17/2016 - 05/22/2016).    He has received 15 cycles of Keytruda (07/03/2016 - 10/12/2016; 11/22/2016 - 06/05/2017).  He has tolerated treatment well.  He receives [place] every 6 weeks (began 05/22/2016; last 06/26/2017).  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  He was on Xarelto from 04/13/2017 - 07/24/2017. Lovenox injections were not started on 07/13/2017 as ordered; ordered again to start 07/25/2017.  He is taking Lovenox.  He has increased liver function tests.  Etiology is possibly secondary to acute hepatitis, irritation due to thrombosis, or progressive disease (metastatic liver lesion or [place]).  He is hepatitis B core antibody and hepatitis C antibody positive.  Hepatitis C RNA was 5.474 log10 IU/ml (298,000 IU/ml) on 07/03/2017.  Hepatitis C genotype is 1a.  AFP was 589.7 on 07/03/2017.  Chest, abdomen, and pelvic CT on 07/02/2017 revealed interval increase in the left hepatic vein thrombosis with progressive heterogeneity and enhancement of the left hepatic lobe, predominantly obscuring the known left hepatic lobe lesion. There was a possible new 1.5 cm lateral left hepatic lobe lesion versus focal area of heterogeneity. There was slight interval decrease in the size of the irregular low-attenuation lesion within segment 8 of the liver. Right lung was stable in appearance with interval development of consolidation within the lingula  representing atelectasis or infection.  Abdominal MRI on 07/05/2017 revealed persistent mass within lateral segment of left lobe of liver with progressive thrombosis of the left portal vein. There was also persistent thrombosis of the left hepatic vein. Portal vein thrombus extended up to the level of the main portal vein and was likely tumor thrombus. The liver has morphologic features suggestive of early cirrhosis. All findings are highly suggestive that the lesion in the left lobe of liver represents hepatoma rather than lung cancer metastasis. The lesion within segment 8 of the liver had decreased in size when compared with 04/13/2017.  The small lesion within segment 7 of the liver appeared new from previous exam. Indeterminate. This may represent an area of lung cancer metastasis or possibly multifocal HCC.  Ultrasound guided liver biopsy on 07/18/2017 revealed carcinoma c/w hepatocellular carcinoma.   He underwent arteriography, embolization and shunt calculations on 07/24/2017 in preparation for Y-90 radioembolization of his liver.  He underwent left accessory hepatic arteriography with chemotherapy embolization of accessory hepatic artery with doxorubicin-eluting beads on 08/20/2017. Tumor was targeted in the lateral segment of the left lobe of the liver.  AFP has been followed: 589.7 on 07/03/2017, 819.2 on 08/20/2017, 475.6 on 09/07/2017, 1188 on 10/29/2017, and 1457 on 11/23/2017.  PET scan on 09/17/2017 revealed the original 2 lesions shown on the prior exam are less cohesivly visible, there were new spreading confluent lesions in segments 1, 2, 3, 4, 5, and 8 of the liver compatible with progressive multifocal hepatic malignancy. 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  There was post therapy related findings in the lungs without active pulmonary or osseous metastatic disease identified.  Chest, abdomen, and pelvic CT on 11/26/2017 revealed  development of bilateral pulmonary nodules, consistent with metastatic disease.  There was moderate volume left-sided  pulmonary emboli.  There was increased tiny left and new trace right pleural effusion.  There was progression of hepatic metastasis, when compared to the prior diagnostic CT of 07/02/2017.  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  There was right-sided colonic wall thickening is most likely due to portal venous hypertension and portal vein thrombus. There was new small volume abdominopelvic ascites.  There was developing high left hepatic lobe intrahepatic biliary duct dilatation.  He began lenvatinib on 10/16/2017.  He is taking it more consistently.  Code status is DNR/DNI.  Symptomatically, he remains fatigued.  His weight has decreased by 2 pounds. Exam reveals abdominal tenderness; mainly over RIGHT upper quadrant. WBC is 4400 with ANC 4000. Hemoglobin is 14.9, hematocrit 45.0, and platelets 234,000. Albumin is low at 2.0. Corrected calcium is normal (9.3).  LFTs have improved. AST 142, ALT 45, alkaline phosphatase 168, and total bilirubin 4.1.  Plan: 1.  Labs today:  CBC with diff, CMP.   2.  Discuss hepatic function. Total bilirubin is decreased to 4.1 (previously 6.8). Discuss elevation related to his [place]. Patient to continue lenvatinib. Patient almost out of his medication. Spoke with oral chemo pharmacy and learned that patient's annual application for [id number] is still in process. Pharmacist to reach out to drug representative to see if we can obtain samples that will ensure that patient does not go without his medication while waiting for his financial assistance application to process.  3.  Discuss anticoagulation. Patient using prescribed Lovenox (Enoxaparin) as directed. Continue.  4.  Discuss pain control. Patient is taking the prescribed Oxycodone. He notes that this intervention is adequate in  managing his pain. Refill Rx: Oxycodone IR 19m q4 PRN (Disp #40), 5.  Discuss weight. Patient encouraged to increase calorie and protein intake as much as possible. Patient to use supplement shakes BID to TID. Samples provided.  6.  RTC in 2 weeks for MD assessment and labs (CBC with diff, CMP).   MLequita Asal MD  12/27/2017, 8:59 AM   I saw and evaluated the patient, participating in the key portions of the service and reviewing pertinent diagnostic studies and records.  I reviewed the nurse practitioner's note and agree with the findings and the plan.  The assessment and plan were discussed with the patient.  Several questions were asked by the patient and answered.   MLequita Asal MD 12/27/2017, 8:59 AM

## 2018-01-02 ENCOUNTER — Telehealth: Payer: Self-pay | Admitting: *Deleted

## 2018-01-02 NOTE — Telephone Encounter (Signed)
Tried calling patient to Reschedule his missed 12/27/17 lab/MD x2 Was unable to leave a message. So, I then tried calling patient son  And left a message on son's vmail.

## 2018-01-22 ENCOUNTER — Telehealth: Payer: Self-pay | Admitting: *Deleted

## 2018-01-22 NOTE — Telephone Encounter (Signed)
[name] called to report that patient is not doing well and his abdominal is swollen and he looks jaundiced. Patient was a No Show for his last appointment 12/27/17. She states he is refusing Hospice because he cannot get his chemotherapy pills if he goes with hospice and that he is also refusing to come in to be seen. She requests that we call him and talk to him about coming in to be seen. He is currently staying with his sons girlfriend as he is unable to care for himself and her number is [phone]. His mobile number is [phone]. She requests that when we call him that we ask if she can be added to release of Information for him.

## 2018-01-22 NOTE — Telephone Encounter (Signed)
Attempted to call patient - no answer.  Will try again tomorrow.

## 2018-01-25 ENCOUNTER — Inpatient Hospital Stay: Payer: Medicare Other | Attending: Hematology and Oncology

## 2018-01-25 ENCOUNTER — Encounter: Payer: Self-pay | Admitting: Hematology and Oncology

## 2018-01-25 ENCOUNTER — Telehealth: Payer: Self-pay | Admitting: Hematology and Oncology

## 2018-01-25 ENCOUNTER — Other Ambulatory Visit: Payer: Self-pay | Admitting: Hematology and Oncology

## 2018-01-25 ENCOUNTER — Inpatient Hospital Stay (HOSPITAL_BASED_OUTPATIENT_CLINIC_OR_DEPARTMENT_OTHER): Payer: Medicare Other | Admitting: Hematology and Oncology

## 2018-01-25 VITALS — BP 115/82 | HR 108 | Temp 96.0°F | Wt 129.6 lb

## 2018-01-25 DIAGNOSIS — Z66 Do not resuscitate: Secondary | ICD-10-CM | POA: Insufficient documentation

## 2018-01-25 DIAGNOSIS — I820 Budd-Chiari syndrome: Secondary | ICD-10-CM

## 2018-01-25 DIAGNOSIS — Z7189 Other specified counseling: Secondary | ICD-10-CM

## 2018-01-25 DIAGNOSIS — L89159 Pressure ulcer of sacral region, unspecified stage: Secondary | ICD-10-CM | POA: Diagnosis not present

## 2018-01-25 DIAGNOSIS — R188 Other ascites: Secondary | ICD-10-CM | POA: Insufficient documentation

## 2018-01-25 DIAGNOSIS — R634 Abnormal weight loss: Secondary | ICD-10-CM | POA: Diagnosis not present

## 2018-01-25 DIAGNOSIS — B1920 Unspecified viral hepatitis C without hepatic coma: Secondary | ICD-10-CM

## 2018-01-25 DIAGNOSIS — C3411 Malignant neoplasm of upper lobe, right bronchus or lung: Secondary | ICD-10-CM | POA: Diagnosis not present

## 2018-01-25 DIAGNOSIS — C220 Liver cell carcinoma: Secondary | ICD-10-CM | POA: Insufficient documentation

## 2018-01-25 DIAGNOSIS — I2699 Other pulmonary embolism without acute cor pulmonale: Secondary | ICD-10-CM

## 2018-01-25 DIAGNOSIS — D721 Eosinophilia: Secondary | ICD-10-CM | POA: Insufficient documentation

## 2018-01-25 DIAGNOSIS — E43 Unspecified severe protein-calorie malnutrition: Secondary | ICD-10-CM

## 2018-01-25 DIAGNOSIS — D72819 Decreased white blood cell count, unspecified: Secondary | ICD-10-CM | POA: Diagnosis not present

## 2018-01-25 DIAGNOSIS — Z87891 Personal history of nicotine dependence: Secondary | ICD-10-CM | POA: Diagnosis not present

## 2018-01-25 DIAGNOSIS — B1910 Unspecified viral hepatitis B without hepatic coma: Secondary | ICD-10-CM

## 2018-01-25 DIAGNOSIS — Z86711 Personal history of pulmonary embolism: Secondary | ICD-10-CM

## 2018-01-25 DIAGNOSIS — L89151 Pressure ulcer of sacral region, stage 1: Secondary | ICD-10-CM

## 2018-01-25 DIAGNOSIS — D72829 Elevated white blood cell count, unspecified: Secondary | ICD-10-CM | POA: Insufficient documentation

## 2018-01-25 DIAGNOSIS — I81 Portal vein thrombosis: Secondary | ICD-10-CM

## 2018-01-25 DIAGNOSIS — G893 Neoplasm related pain (acute) (chronic): Secondary | ICD-10-CM

## 2018-01-25 LAB — CBC WITH DIFFERENTIAL/PLATELET
Basophils Absolute: 0.1 10*3/uL (ref 0–0.1)
Basophils Relative: 2 %
Eosinophils Absolute: 0.1 10*3/uL (ref 0–0.7)
Eosinophils Relative: 2 %
HCT: 47.4 % (ref 40.0–52.0)
Hemoglobin: 16 g/dL (ref 13.0–18.0)
Lymphocytes Relative: 15 %
Lymphs Abs: 0.9 10*3/uL — ABNORMAL LOW (ref 1.0–3.6)
MCH: 36.4 pg — ABNORMAL HIGH (ref 26.0–34.0)
MCHC: 33.8 g/dL (ref 32.0–36.0)
MCV: 107.7 fL — ABNORMAL HIGH (ref 80.0–100.0)
Monocytes Absolute: 0.5 10*3/uL (ref 0.2–1.0)
Monocytes Relative: 9 %
Neutro Abs: 4.4 10*3/uL (ref 1.4–6.5)
Neutrophils Relative %: 74 %
Platelets: 188 10*3/uL (ref 150–440)
RBC: 4.4 MIL/uL (ref 4.40–5.90)
RDW: 19.4 % — ABNORMAL HIGH (ref 11.5–14.5)
WBC: 5.9 10*3/uL (ref 3.8–10.6)

## 2018-01-25 LAB — COMPREHENSIVE METABOLIC PANEL
ALT: 46 U/L (ref 17–63)
AST: 114 U/L — ABNORMAL HIGH (ref 15–41)
Albumin: 1.7 g/dL — ABNORMAL LOW (ref 3.5–5.0)
Alkaline Phosphatase: 126 U/L (ref 38–126)
Anion gap: 6 (ref 5–15)
BUN: 14 mg/dL (ref 6–20)
CO2: 25 mmol/L (ref 22–32)
Calcium: 8.4 mg/dL — ABNORMAL LOW (ref 8.9–10.3)
Chloride: 100 mmol/L — ABNORMAL LOW (ref 101–111)
Creatinine, Ser: 0.83 mg/dL (ref 0.61–1.24)
GFR calc Af Amer: >60 mL/min (ref >60)
GFR calc non Af Amer: >60 mL/min (ref >60)
Glucose, Bld: 198 mg/dL — ABNORMAL HIGH (ref 65–99)
Potassium: 4.2 mmol/L (ref 3.5–5.1)
Sodium: 131 mmol/L — ABNORMAL LOW (ref 135–145)
Total Bilirubin: 5 mg/dL — ABNORMAL HIGH (ref 0.3–1.2)
Total Protein: 8.2 g/dL — ABNORMAL HIGH (ref 6.5–8.1)

## 2018-01-25 MED ORDER — OXYCODONE HCL 5 MG PO TABS: ORAL_TABLET | ORAL | 0 refills | Status: DC

## 2018-01-25 NOTE — Progress Notes (Signed)
[facility] day:  01/25/2018   Chief Complaint: Gerald [name] is a 67 y.o. male with stage IV adenocarcinoma of the lung and hepatocellular carcinoma on lenvatinib who is seen for reassessment.  HPI:  The patient was last seen by me in the medical oncology clinic on 12/13/2017.  At that time,  he remained fatigued.  Weight was down 2 pounds. Exam revealed abdominal tenderness; mainly over RIGHT upper quadrant. Counts were good.  Albumin was low at 2.0. LFTs had improved. AST 142, ALT 45, alkaline phosphatase 168, and total bilirubin 4.1.  Pain was well controlled.  He was taking lenvatinib more consistently.  He wished to continue treatment.  He was taking Lovenox for portal, hepatic, IVC thrombosis, and pulmonary emboli.  He missed his follow-up appointment.  During the interim, patient is spending the majority of his days sitting in a recliner or sleeping. Patient is very weak. Patient has moved in with his daughter Gerald [name]. His daughter does not work, therefore she is available to care for him. Patient mostly snacks rather than eating full meals. He has lost an additional 4 pounds. Patient denies nausea, vomiting, and changes in his bowel habits. Patient is short of breath at rest. He becomes breathless when speaking. Patient has developed abdominal ascites as a result of his hepatocellular carcinoma.   Patient is able to independently complete most ADLs. Family has purchased a shower chair to assist with bathing. Patient is in a wheelchair today due to his progressive debility.  Patient continues on his lenvatinib as prescribed. Patient notes that he has a 2 week supply at home. Patient is no longer on the ordered enoxaparin injections citing the fact "I just don't like them shots and I am not willing to to do them any more".   Patient has developed pressure related skin breakdown on his sacrum.   Past Medical History:  Diagnosis Date  . Arthritis     Rheumatoid arthritis  . Cancer ([place])    lung ca   . Collagen vascular disease (HCC)    RA  . Dyspnea   . Hepatitis    b and c  . Mass of lung   . Pneumonia   . Pneumothorax    spontaneous  . Renal disorder    as a child    Past Surgical History:  Procedure Laterality Date  . FLEXIBLE BRONCHOSCOPY N/A 03/30/2016   Procedure: FLEXIBLE BRONCHOSCOPY;  Surgeon: Vilinda Boehringer, MD;  Location: [facility] ORS;  Service: Cardiopulmonary;  Laterality: N/A;  . IR ANGIOGRAM SELECTIVE EACH ADDITIONAL VESSEL  07/24/2017  . IR ANGIOGRAM SELECTIVE EACH ADDITIONAL VESSEL  07/24/2017  . IR ANGIOGRAM SELECTIVE EACH ADDITIONAL VESSEL  07/24/2017  . IR ANGIOGRAM SELECTIVE EACH ADDITIONAL VESSEL  07/24/2017  . IR ANGIOGRAM SELECTIVE EACH ADDITIONAL VESSEL  07/24/2017  . IR ANGIOGRAM SELECTIVE EACH ADDITIONAL VESSEL  07/24/2017  . IR ANGIOGRAM SELECTIVE EACH ADDITIONAL VESSEL  07/24/2017  . IR ANGIOGRAM SELECTIVE EACH ADDITIONAL VESSEL  08/20/2017  . IR ANGIOGRAM VISCERAL SELECTIVE  07/24/2017  . IR ANGIOGRAM VISCERAL SELECTIVE  08/20/2017  . IR EMBO ARTERIAL NOT HEMORR HEMANG INC GUIDE ROADMAPPING  07/24/2017  . IR EMBO TUMOR ORGAN ISCHEMIA INFARCT INC GUIDE ROADMAPPING  08/20/2017  . IR RADIOLOGIST EVAL & MGMT  07/11/2017  . IR US GUIDE VASC ACCESS RIGHT  07/24/2017  . Ureter repair as a child      Family History  Problem Relation Age of Onset  .  Brain cancer Mother   . Lung cancer Maternal Uncle   . Cancer Sister        Metastatic cancer- unknown primary    Social History:  reports that he quit smoking about 17 years ago. His smoking use included cigarettes. He has a 13.00 pack-year smoking history. he has never used smokeless tobacco. He reports that he drinks alcohol. He reports that he does not use drugs.  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  He lives in [place].  Contact number is [phone].  He has a great pyrenees.  He lives with his daughter  now.  The patient is accompanied by 2 women today.  Allergies:  Allergies  Allergen Reactions  . Nsaids Other (See Comments)    Can take NSAIDs short term but cannot be on it long term d/t renal insuff  . Sulfa Antibiotics Other (See Comments)    Reaction:  GI upset     Current Medications: Current Outpatient Medications  Medication Sig Dispense Refill  . lenvatinib 8 mg daily dose (LENVIMA) 4 (2) MG capsule Take 2 capsules (8 mg total) by mouth daily. 60 capsule 2  . oxyCODONE (OXY IR/ROXICODONE) 5 MG immediate release tablet Take 1-2 tablets every 4 hours as needed for pain 40 tablet 0  . Calcium Carb-Cholecalciferol (CALCIUM-VITAMIN D) 500-200 MG-UNIT tablet Take 1 tablet by mouth 2 (two) times daily.    . ondansetron (ZOFRAN) 4 MG tablet Take 1 tablet (4 mg total) every 8 (eight) hours as needed by mouth for nausea or vomiting. (Patient not taking: Reported on 12/06/2017) 30 tablet 0   No current facility-administered medications for this visit.     Review of Systems:  GENERAL:  Feels "weak". Fatigued. Needs assistance.  Spends most of day resting.  No fever, chills or sweats.  Weight down 4 pounds. PERFORMANCE STATUS (ECOG):  3 HEENT: No visual changes, sore throat, mouth sores or tenderness. Lungs:  Chronic shortness of breath.  No cough.  No hemoptysis. Cardiac:  No chest pain, palpitations, orthopnea, or PND.  Sleeps in a recliner. GI:  Abdominal distention. Appetite poor.  Eating and drinking "some".  Doesn't like Boost.  Nausea. No vomiting, diarrhea, constipation, melena or hematochezia. GU:  No urgency, frequency, dysuria, or hematuria. Musculoskeletal:  Chronic right knee pain. No back pain.  No muscle tenderness. Extremities:  No pain or swelling. Skin:  Bruising.  Skin breakdown.  No rashes or ulcers. Neuro:  Neuropathy (stable) on gabapentin.  No headache, focal weakness, balance or coordination issues. Endocrine:  No diabetes, thyroid issues, hot flashes or night  sweats. Psych:  No mood changes, depression or anxiety. Pain:  3 out of 10 - abdomen. Review of systems:  All other systems reviewed and found to be negative.  Physical Exam: Blood pressure 115/82, pulse (!) 108, temperature (!) 96 F (35.6 C), temperature source Tympanic, weight 129 lb 9.6 oz (58.8 kg), SpO2 96 %. GENERAL:  Thin fatigued appearing gentleman sitting comfortably in a wheelchair in the exam room in no acute distress. MENTAL STATUS:  Alert and oriented to person, place and time.  HEAD:Wearing a cap. Short gray hair. Thin gray beard. Temporal wasting. Normocephalic, atraumatic, face symmetric, no Cushingoid features. EYES:Blue eyes. Scleral icterus.  Pupils equal round and reactive to light and accomodation. No conjunctivitis. KZS:WFUXNATFTD clear without lesion. Tonguenormal. Mucous membranes dry. RESPIRATORY:Clear to auscultationwithout rales, wheezes or rhonchi. CARDIOVASCULAR:Regular rate andrhythmwithout murmur, rub or  gallop. ABDOMEN: Distended. (+) fluid wave. (+) tender over liver.  No guarding or rebound tenderness.  Active bowel sounds and no splenomegaly. No masses. SKIN: Jaundice.  Right upper extremity ecchymosis.  Dry. Sacral decubitus ulcer. No rashes. EXTREMITIES: No edema, no skin discoloration or tenderness. No palpable cords. LYMPHNODES: No palpable cervical, supraclavicular, axillary or inguinal adenopathy  NEUROLOGICAL: Unremarkable. PSYCH: Appropriate.    Imaging studies: 03/27/2016:  Chest CT angiogram revealed an 8 cm mass in the medial right upper lobe. The mass abutted the mediastinum and possibly invaded the left atrium. There was associated widespread thoracic adenopathy. There was a 2.6 cm enhancing lesion in the medial segment of the left hepatic lobe. 04/05/2016:  PET scan revealed intense hypermetabolism (SUV 17.95) associated with a 7.5 cm central perihilar right lung lesion. There was mediastinal (sub-carinal node  SUV 8.89; 2.6 cm right paratracheal node SUV 17.49) and upper abdominal retroperitoneal hypermetabolic adenopathy (9 mm aortocaval node SUV 9.2). There were 2 liver metastasis (2.3 cm left lobe SUV 6.19; 2.2 cm segment VIII SUV 5.18). There are multifocal bone metastasis (C2 vertebral body SUV 9.0; 1.6 cm left sacral ala lesion SUV of 8.6).  03/31/2016:  Head MRI was indeterminate for early metastatic disease to the brain. There was a small focus of gyral enhancement in the anterior right frontal lobe likely is post ischemic, while a small posterior right cerebellar enhancing lesion was more suspicious for small brain metastasis. There were multiple small primarily subacute appearing infarcts in the bilateral MCA and left PICA territories.  04/07/2016:  Bone scan revealed a subtle distal right femur lesion which might be a small metastasis.  Bone scan was felt to be an unreliable modality for evaluation of osseous metastatic disease as the bone metastases seen on the recent PET and MRI were not evident. 05/12/2016:  Head MRI revealed expected interval evolution of bilateral cerebral in cerebellar infarcts. There was resolution of right frontal and right cerebellar enhancement consistent with subacute infarcts. There was a new 3 mm focus of gyral enhancement in the left occipital lobe and a possible new 4 mm enhancing lesion in the left cerebellum (? vascular enhancement versus tiny metastasis).  08/16/2016:  Head MRI revealed multiple areas of improving enhancement and restricted diffusion compatible with resolving subacute infarcts.  There was no evidence of metastatic disease or acute infarction  10/19/2016:  Chest, abdomen, and pelvic CT scan revealed radiation changes in the right lung.  The superimposed right lung pneumonia had improved.  The 6.4 x 2.4 cm right perihilar mass had decreased.  There was improving thoracic lymphadenopathy.  There was progression of hepatic metastases, measuring up to  4.7 cm.  Specifically, there was a 3.9 x 3.7 cm metastasis in segment 8 (previously 2.5 x 2.8 cm) and a 4.7 x 4.0 cm metastasis in segment 4B (previously 3.1 x 2.6 cm). 11/14/2016:  PET scan revealed 2 liver masses. Both were significantly more hypermetabolic than on 78/29/5621 and have enlarged compared to the prior PET-CT.  The segment 8 lesion was centrally necrotic but with high activity along its periphery (7.4), and about the same size as it was on 10/19/2016. The lateral segment left hepatic lobe lesion had enlarged (5.9 x 5.3 cm) compared to 10/19/16 (4.1 x 4.4 cm).  The bony metastatic lesions were no longer hypermetabolic and have resolved.  There were post therapy and postoperative findings in the right lung, with a considerable amount of suspected radiation pneumonitis and radiation fibrosis. 12/29/2016:  Chest, abdomen, and pelvic  CT revealed mild reduction in size and central necrosis of the hepatic metastatic lesions.  There was continued post therapy related findings along the right hilar region. Dominant right perihilar mass was reduced in thickness compared to 10/19/2016.  04/13/2017:  Chest, abdomen, and pelvic CT revealed new left hepatic vein thrombosis. This somewhat obscured the known left hepatic lobe tumor although overall the left hepatic lobe masses were thought to be similar in size compared the prior exam. The segment 8 lesion was less well seen than on the prior exam.  The central necrotic portion was seen but the peripheral rim was much less conspicuous (could represent improvement in this tumor).  There was mild increase in adenopathy in the gastrohepatic ligament.  There was essentially stable appearance in the lungs with considerable radiation pneumonitis, scarring, and volume loss in the right lung, along with a trace loculated pleural effusion with enhancing margins.  There was stable appearance of prior splenic infarcts and prior scarring in the left mid kidney. 07/02/2017:   Chest, abdomen, and pelvic CT revealed interval increase in the left hepatic vein thrombosis with progressive heterogeneity and enhancement of the left hepatic lobe, predominantly obscuring the known left hepatic lobe lesion. There was a possible new 1.5 cm lateral left hepatic lobe lesion versus focal area of heterogeneity. There was slight interval decrease in the size of the irregular low-attenuation lesion within segment 8 of the liver. Right lung was stable in appearance with interval development of consolidation within the lingula representing atelectasis or infection. 07/05/2017:  Abdominal MRI revealed persistent mass within lateral segment of left lobe of liver with progressive thrombosis of the left portal vein. There was also persistent thrombosis of the left hepatic vein. Portal vein thrombus extended up to the level of the main portal vein and was likely tumor thrombus. The liver has morphologic features suggestive of early cirrhosis. All findings are highly suggestive that the lesion in the left lobe of liver represents hepatoma rather than lung cancer metastasis. The lesion within segment 8 of the liver had decreased in size when compared with 04/13/2017.  The small lesion within segment 7 of the liver appeared new from previous exam. Indeterminate. This may represent an area of lung cancer metastasis or possibly multifocal [place] 09/17/2017:  PET scan revealed the original 2 lesions shown on the prior exam are less cohesivly visible, there were new spreading confluent lesions in segments 1, 2, 3, 4, 5, and 8 of the liver compatible with progressive multifocal hepatic malignancy. 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  There was post therapy related findings in the lungs without active pulmonary or osseous metastatic disease identified. 11/26/2017:  Chest, abdomen, and pelvic CT revealed development of bilateral pulmonary nodules, consistent with  metastatic disease.  There was moderate volume left-sided pulmonary emboli.  There was similar radiation change within the medial right lung.  There was increased tiny left and new trace right pleural effusion.  There was progression of hepatic metastasis, especially when compared to the prior diagnostic CT of 07/02/2017.  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  There was right-sided colonic wall thickening is most likely due to portal venous hypertension and portal vein thrombus.  Concurrent infectious colitis could not be excluded.  There was new small volume abdominopelvic ascites.  There was developing high left hepatic lobe intrahepatic biliary duct dilatation.    Appointment on 01/25/2018  Component  Date Value Ref Range Status  . WBC 01/25/2018 5.9  3.8 - 10.6 K/uL Final  . RBC 01/25/2018 4.40  4.40 - 5.90 MIL/uL Final  . Hemoglobin 01/25/2018 16.0  13.0 - 18.0 g/dL Final  . HCT 01/25/2018 47.4  40.0 - 52.0 % Final  . MCV 01/25/2018 107.7* 80.0 - 100.0 fL Final  . MCH 01/25/2018 36.4* 26.0 - 34.0 pg Final  . MCHC 01/25/2018 33.8  32.0 - 36.0 g/dL Final  . RDW 01/25/2018 19.4* 11.5 - 14.5 % Final  . Platelets 01/25/2018 188  150 - 440 K/uL Final  . Neutrophils Relative % 01/25/2018 74  % Final  . Neutro Abs 01/25/2018 4.4  1.4 - 6.5 K/uL Final  . Lymphocytes Relative 01/25/2018 15  % Final  . Lymphs Abs 01/25/2018 0.9* 1.0 - 3.6 K/uL Final  . Monocytes Relative 01/25/2018 9  % Final  . Monocytes Absolute 01/25/2018 0.5  0.2 - 1.0 K/uL Final  . Eosinophils Relative 01/25/2018 2  % Final  . Eosinophils Absolute 01/25/2018 0.1  0 - 0.7 K/uL Final  . Basophils Relative 01/25/2018 2  % Final  . Basophils Absolute 01/25/2018 0.1  0 - 0.1 K/uL Final   Performed at [facility], [address], [place], [place] [address]  . Sodium 01/25/2018 131* 135 - 145 mmol/L Final  . Potassium 01/25/2018 4.2  3.5 - 5.1  mmol/L Final  . Chloride 01/25/2018 100* 101 - 111 mmol/L Final  . CO2 01/25/2018 25  22 - 32 mmol/L Final  . Glucose, Bld 01/25/2018 198* 65 - 99 mg/dL Final  . BUN 01/25/2018 14  6 - 20 mg/dL Final  . Creatinine, Ser 01/25/2018 0.83  0.61 - 1.24 mg/dL Final  . Calcium 01/25/2018 8.4* 8.9 - 10.3 mg/dL Final  . Total Protein 01/25/2018 8.2* 6.5 - 8.1 g/dL Final  . Albumin 01/25/2018 1.7* 3.5 - 5.0 g/dL Final  . AST 01/25/2018 114* 15 - 41 U/L Final  . ALT 01/25/2018 46  17 - 63 U/L Final  . Alkaline Phosphatase 01/25/2018 126  38 - 126 U/L Final  . Total Bilirubin 01/25/2018 5.0* 0.3 - 1.2 mg/dL Final  . GFR calc non Af Amer 01/25/2018 >60  >60 mL/min Final  . GFR calc Af Amer 01/25/2018 >60  >60 mL/min Final   Comment: (NOTE) The eGFR has been calculated using the CKD EPI equation. This calculation has not been validated in all clinical situations. eGFR's persistently <60 mL/min signify possible Chronic Kidney Disease.   Georgiann Hahn gap 01/25/2018 6  5 - 15 Final   Performed at [facility], [address] [place], [place] [id number]    Assessment:  Zevin Nevares is a 67 y.o. male with stage IV adenocarcinoma of the lung and associated leukocytosis with eosinophilia. He presented with a 30 pound weight loss, shortness of breath, cough, and left leg discomfort. He has a 13-15 pack year smoking history.  Bronchoscopy on 03/30/2016 revealed an endobronchial lesion in the RUL anterior segment with 95% occlusion. There was extrinsic compression in the right middle lobe secondary to posterior mass effect. Pathology confirmed non-small cell lung cancer, favor adenocarcinoma.  PD-L1 testing revealed high expression (> 50%).  EGFR, ALK, and ROS1 were negative.  CEA was 2.3 on 03/27/2016.  PET scan on 04/05/2016 revealed intense hypermetabolism (SUV 17.95) associated with a 7.5 cm central perihilar right lung lesion. There was mediastinal (sub-carinal node SUV 8.89; 2.6 cm right  paratracheal node SUV 17.49) and  upper abdominal retroperitoneal hypermetabolic adenopathy (9 mm aortocaval node SUV 9.2). There were 2 liver metastasis (2.3 cm left lobe SUV 6.19; 2.2 cm segment VIII SUV 5.18). There are multifocal bone metastasis (C2 vertebral body SUV 9.0; 1.6 cm left sacral ala lesion SUV of 8.6).   He had marked leukocytosis with hypereosinophilia. Initial WBC was 72,300 with 53% eosinophils. Bone marrow on 03/29/2016 revealed marked increase in marrow eosinophils (approximate 30%). There was no diagnostic morphologic evidence of a myeloproliferative or lymphoid neoplasm. Flow cytometry revealed significant increase in eosinophils (54%). There was relative decreased myeloid cells with no significant immunophenotypic abnormalities or increase in blasts. There was no lymphoid abnormalities or evidence of clonality. FISH studies were negative for myeloproliferative neoplasms with eosinophilia (PDGFRA, PDGFRB, FGFR1). BCR-ABL was negative on 03/28/2016.  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  Left lower extremity duplex on 03/25/2016 revealed no evidence of DVT. Discomfort improved on Neurontin 300 mg a day and continues to improve with declining eosinophilia.  He received 41.4 Gy from 04/11/2016 - 05/23/2016.  He received 4 weeks of concurrent carboplatin and Taxol (04/17/2016 - 05/22/2016).    He has received 15 cycles of Keytruda (07/03/2016 - 10/12/2016; 11/22/2016 - 06/05/2017).  He has tolerated treatment well.  He receives [place] every 6 weeks (began 05/22/2016; last 06/26/2017).  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  He was on Xarelto from 04/13/2017 - 07/24/2017. Lovenox injections were not started on 07/13/2017 as ordered; ordered again to start 07/25/2017.  He is taking Lovenox.  He has increased liver function tests.  Etiology is possibly secondary to acute hepatitis, irritation  due to thrombosis, or progressive disease (metastatic liver lesion or [place]).  He is hepatitis B core antibody and hepatitis C antibody positive.  Hepatitis C RNA was 5.474 log10 IU/ml (298,000 IU/ml) on 07/03/2017.  Hepatitis C genotype is 1a.  AFP was 589.7 on 07/03/2017.  Chest, abdomen, and pelvic CT on 07/02/2017 revealed interval increase in the left hepatic vein thrombosis with progressive heterogeneity and enhancement of the left hepatic lobe, predominantly obscuring the known left hepatic lobe lesion. There was a possible new 1.5 cm lateral left hepatic lobe lesion versus focal area of heterogeneity. There was slight interval decrease in the size of the irregular low-attenuation lesion within segment 8 of the liver. Right lung was stable in appearance with interval development of consolidation within the lingula representing atelectasis or infection.  Abdominal MRI on 07/05/2017 revealed persistent mass within lateral segment of left lobe of liver with progressive thrombosis of the left portal vein. There was also persistent thrombosis of the left hepatic vein. Portal vein thrombus extended up to the level of the main portal vein and was likely tumor thrombus. The liver has morphologic features suggestive of early cirrhosis. All findings are highly suggestive that the lesion in the left lobe of liver represents hepatoma rather than lung cancer metastasis. The lesion within segment 8 of the liver had decreased in size when compared with 04/13/2017.  The small lesion within segment 7 of the liver appeared new from previous exam. Indeterminate. This may represent an area of lung cancer metastasis or possibly multifocal HCC.  Ultrasound guided liver biopsy on 07/18/2017 revealed carcinoma c/w hepatocellular carcinoma.   He underwent arteriography, embolization and shunt calculations on 07/24/2017 in preparation for Y-90 radioembolization of his liver.  He underwent left accessory hepatic arteriography  with chemotherapy embolization of accessory hepatic artery with doxorubicin-eluting  beads on 08/20/2017. Tumor was targeted in the lateral segment of the left lobe of the liver.  AFP has been followed: 589.7 on 07/03/2017, 819.2 on 08/20/2017, 475.6 on 09/07/2017, 1188 on 10/29/2017, 1457 on 11/23/2017, and 872.7 on 01/25/2018.  PET scan on 09/17/2017 revealed the original 2 lesions shown on the prior exam are less cohesivly visible, there were new spreading confluent lesions in segments 1, 2, 3, 4, 5, and 8 of the liver compatible with progressive multifocal hepatic malignancy. 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  There was post therapy related findings in the lungs without active pulmonary or osseous metastatic disease identified.  Chest, abdomen, and pelvic CT on 11/26/2017 revealed development of bilateral pulmonary nodules, consistent with metastatic disease.  There was moderate volume left-sided pulmonary emboli.  There was increased tiny left and new trace right pleural effusion.  There was progression of hepatic metastasis, when compared to the prior diagnostic CT of 07/02/2017.  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  There was right-sided colonic wall thickening is most likely due to portal venous hypertension and portal vein thrombus. There was new small volume abdominopelvic ascites.  There was developing high left hepatic lobe intrahepatic biliary duct dilatation.  He began lenvatinib on 10/16/2017.  He is taking it more consistently.  Code status is DNR/DNI.  Symptomatically, patient is declining due to progression of his disease. He is short of breath secondary to ascites. His weight has decreased by 4 pounds since his last visit. Exam reveals abdominal tenderness; mainly over RIGHT upper quadrant. His abdomen is distended with an appreciable fluid wave. WBC is  5900 with ANC 4400. Hemoglobin is 16, hematocrit 47.4, and platelets 188,000. Albumin is low at 1.7. Corrected calcium is normal (9.8).  LFTs remain elevated; AST 114, ALT 46, alkaline phosphatase 168, and total bilirubin 5.0.  Plan: 1.  Labs today:  CBC with diff, CMP, AFP. 2.  Discuss hepatic function. Total bilirubin has increased to 5.0 (previously 4.1). Discuss elevation related to his [place].  Patient verbalizes that he would like to continue lenvatinib.   3.  Discuss anticoagulation. Patient is no longer taking the prescribed enoxaparin. He refuses to restart despite education regarding the negative sequela associated with his decision.   4.  Discuss pain control. Patient is taking the prescribed Oxycodone, however not as prescribed, which results in his pain not being adequately controlled. Refill Rx: Oxycodone IR 12m q4 PRN (Disp #30). 5.  Discuss palliative care referral. Patient with progressive disease and debility. Would benefit from extra care services and further focused GCarolinadiscussions. Will send referral today for STAT consult. 6.  Discuss sacral decubitus ulcers. Areas likely to progress without proper intervention due to patient sitting/lying most of the day. His nutrition is poor (albumin 1.7), which is also contributory. Will have palliative care address as well.  7.  Discuss weight loss. Patient encouraged to increase calorie and protein intake as much as possible. Patient to use supplement shakes BID to TID.  8.  Discuss advanced directive. Patient expresses wish to be DNR/DNI. He also wants to make family member HCPOA today. DNR and HCPOA completed and [facility].  9.  RTC in 1 month for MD assessment, labs (CBC with diff, CMP).   BHonor Loh NP  01/25/2018, 11:25 AM   I saw and evaluated the patient, participating in the key portions of  the service and reviewing pertinent diagnostic studies and records.  I reviewed the nurse practitioner's note and  agree with the findings and the plan.  The assessment and plan were discussed with the patient.  Numerous questions were asked by the patient and answered.   Lequita Asal, MD 01/25/2018, 11:25 AM

## 2018-01-25 NOTE — Telephone Encounter (Signed)
Oral Oncology Patient Advocate Encounter  I have called patient several times to try to get in touch with him to remind him about bringing his SS award letter. No answer.  Patient was seen today and told me he had forgotten and his daughter was with him and she is going to fax or bring it back to me. He has been staying with her at her house some also.   Lyndel Safe [phone].  [facility] Patient Advocate [phone] 01/25/2018 1:19 PM

## 2018-01-26 LAB — AFP TUMOR MARKER: AFP, Serum, Tumor Marker: 872.7 ng/mL — ABNORMAL HIGH (ref 0.0–8.3)

## 2018-01-28 ENCOUNTER — Telehealth: Payer: Self-pay | Admitting: Hematology and Oncology

## 2018-01-28 NOTE — Telephone Encounter (Signed)
Oral Oncology Patient Advocate Encounter  Sent a copy of award letter to [place] per there request.  [phone] Fax , Phone # [phone]   [place] Patient Advocate [phone] 01/28/2018 8:52 AM

## 2018-01-29 ENCOUNTER — Telehealth: Payer: Self-pay | Admitting: Hematology and Oncology

## 2018-01-29 NOTE — Telephone Encounter (Signed)
Oral Oncology Patient Advocate Encounter  Received notification from [facility] Patient Assistance program that patient has been successfully enrolled into their program to receive Lenvima from the manufacturer at $0 out of pocket until 11/04/2018.   I called and spoke with patient daughter.  He knows we will have to re-apply.   Patient knows to call the office with questions or concerns.  Oral Oncology Clinic will continue to follow.  Told her to call [employer] to set up delivery [phone],  [place] Patient Advocate [phone] 01/29/2018 10:06 AM

## 2018-02-01 ENCOUNTER — Other Ambulatory Visit: Payer: Self-pay | Admitting: Urgent Care

## 2018-02-01 ENCOUNTER — Telehealth: Payer: Self-pay | Admitting: *Deleted

## 2018-02-01 DIAGNOSIS — I820 Budd-Chiari syndrome: Secondary | ICD-10-CM

## 2018-02-01 DIAGNOSIS — C220 Liver cell carcinoma: Secondary | ICD-10-CM

## 2018-02-01 DIAGNOSIS — I81 Portal vein thrombosis: Secondary | ICD-10-CM

## 2018-02-01 DIAGNOSIS — G893 Neoplasm related pain (acute) (chronic): Secondary | ICD-10-CM

## 2018-02-01 DIAGNOSIS — C3411 Malignant neoplasm of upper lobe, right bronchus or lung: Secondary | ICD-10-CM

## 2018-02-01 MED ORDER — OXYCODONE HCL ER 15 MG PO T12A: 15.0000 mg | EXTENDED_RELEASE_TABLET | Freq: Two times a day (BID) | ORAL | 0 refills | Status: DC

## 2018-02-01 MED ORDER — OXYCODONE HCL 5 MG PO TABS: ORAL_TABLET | ORAL | 0 refills | Status: AC

## 2018-02-01 NOTE — Telephone Encounter (Signed)
1500 - Received call back from palliative care team. RN met with patient and his daughter today. Patient was "stable". He adamantly confirmed that he did not want hospice services. He expressed that his wishes were to continue treatment with the oral chemotherapy citing that it was "keeping things at [facility]". RN noted that patient's abdomen was "tight", he had 3+ edema to his BLE, and had developed additional pressure related areas of skin concern to his posterior body surfaces. Expressed needs for home health to assist with wound care. Palliative care team does not provide this service. RN asked for orders to be sent in to home health agency of choice.  RN noted that patient was in pain. He is using more pain medications at this point. She notes that patient has "about 4 pills" left from the Oxycodone IR prescription filled on 01/25/2017, which means patient has used #26 in the last 7 days. Discussed pain control. Patient is cachetic, with minimal subcutaneous fat. Duragesic patches are likely to offer inadequate pain control. I offered Roxinol and MS Contin in addition to the Oxycodone IR that patient can use of breakthrough. Will need to discuss with patient/family to determine his wishes.   Lastly, discussed palliative paracentesis. Palliative RN noted that she discussed this procedure with the patient. He verbalized concerns about re-accumulation. RN discussed the potential of a pleurX drain should he require recurrent procedures. Encounter with patient was left with him "thinking about it". I will plan on touching base with him to get a better feel on this thoughts about the procedure, and to answer an questions that he may have.

## 2018-02-01 NOTE — Telephone Encounter (Signed)
Can you call Hospice and find out what is going on? I have spoken with them twice now. I sent a STAT referral the day he was here. I was told that they would go out that day. I called back several days later after [facility] told me that the family was calling. I spoke with Wendie Simmer and was advised that the patient would be seen that day. I need them to go out TODAY. If they can't complete the referral, I will send orders elsewhere.

## 2018-02-01 NOTE — Telephone Encounter (Signed)
Received a return call from patient's daughter, Gerald [name]. Reviewed palliative consult information from earlier today. Daughter feels like patient would benefit from "better pain control". Given a current oncological diagnosis, this patient has the potential to experience significant cancer related pain. Benefits versus risks associated with continued therapy considered. Will switch pain management approach to long acting therapy using MS Contin, with the short acting Oxycodone IR being used for breakthrough. Patient's daughter educated that medications should not be bitten, chewed, or crushed. Additionally, safety precautions reviewed. Patient's daughter verbalized understanding that medications should not be sold or shared, taken with alcohol, or used while driving.    a. Will send in Rx for MS Contin 15 mg PO BID (Disp #30). Short term supply sent in for now to see if this dose will be effective for the patient.  Will also refill Oxycodone IR 5mg  tablets (Disp #20).  b. Encouraged daughter to maintain a pain diary for the purposes of assisting [place] with adequate dose titrations should patient require stronger dose.   Regarding the palliative paracentesis. Daughter feels as if this is a very good idea for her father. Patient's abdomen reported to be "huge" and he is increasingly short of breath. Discussed the benefits and risks of this procedure. Patient was reported to be resting at the time of my call. I encouraged patient's daughter to speak with her father over the weekend and to let me know if they would like for me to proceed with scheduling the procedure. Daughter to send message through [place] over the weekend, or call me on Monday morning with their decision.

## 2018-02-01 NOTE — Telephone Encounter (Signed)
Daughter called reporting that patient is having pain not controlled by current medicine of Oxycodone and asking that he be given more medicine. Also states Home Health was to call and come out and they have not heard form anyone. Reports that his bedsores are getting really bad. Please advise

## 2018-02-01 NOTE — Telephone Encounter (Signed)
Call placed to Hospice by this NP; spoke with Neoma Laming. I was advised that they have tried to reach out to patient on several occassions, however there has been no return call. I provided Hospice with 2 numbers for patient's daughter [name] and asked that they follow up with them ASAP for the still needed STAT referral. Per Neoma Laming, they will pass information along to "[facility]" and make contact with the patient today.

## 2018-02-01 NOTE — Telephone Encounter (Signed)
Return call received from Gerald Crumble, RN (Palliative Care manager). NP is not working today, however Gerald [name] is planning on making a visit to the patient for initial assessment. Report given. Pain, symptom management, and wound care are all included in the top goals of care for Gerald [name]. Ultimately, the patient would benefit from transitioning to Hospice services, however he elected to continue with treatment. His shortness of breath is increasing secondary to his increased abdominal ascites related to his [place]. Could potentially discuss a palliative paracentesis. Gerald [name] plans on Gerald [name] discussion. Will return call to this NP with recommendations and for orders. I will update Gerald [name].

## 2018-02-04 ENCOUNTER — Telehealth: Payer: Self-pay | Admitting: *Deleted

## 2018-02-04 MED ORDER — MORPHINE SULFATE ER 15 MG PO TBCR: 15.0000 mg | EXTENDED_RELEASE_TABLET | Freq: Two times a day (BID) | ORAL | 0 refills | Status: AC

## 2018-02-04 NOTE — Telephone Encounter (Signed)
Oxycontin 15 mg is on back order What to order in place of it. Per VO B Gray, MS Contin 15 mg bid

## 2018-02-05 ENCOUNTER — Other Ambulatory Visit: Payer: Self-pay | Admitting: Urgent Care

## 2018-02-05 ENCOUNTER — Telehealth: Payer: Self-pay | Admitting: *Deleted

## 2018-02-05 DIAGNOSIS — R52 Pain, unspecified: Secondary | ICD-10-CM | POA: Diagnosis not present

## 2018-02-05 DIAGNOSIS — G608 Other hereditary and idiopathic neuropathies: Secondary | ICD-10-CM | POA: Diagnosis not present

## 2018-02-05 DIAGNOSIS — Z86718 Personal history of other venous thrombosis and embolism: Secondary | ICD-10-CM | POA: Diagnosis not present

## 2018-02-05 DIAGNOSIS — L89312 Pressure ulcer of right buttock, stage 2: Secondary | ICD-10-CM | POA: Diagnosis not present

## 2018-02-05 DIAGNOSIS — Z515 Encounter for palliative care: Secondary | ICD-10-CM | POA: Diagnosis not present

## 2018-02-05 DIAGNOSIS — Z77098 Contact with and (suspected) exposure to other hazardous, chiefly nonmedicinal, chemicals: Secondary | ICD-10-CM | POA: Diagnosis not present

## 2018-02-05 DIAGNOSIS — C3490 Malignant neoplasm of unspecified part of unspecified bronchus or lung: Secondary | ICD-10-CM | POA: Diagnosis not present

## 2018-02-05 DIAGNOSIS — B1910 Unspecified viral hepatitis B without hepatic coma: Secondary | ICD-10-CM | POA: Diagnosis not present

## 2018-02-05 DIAGNOSIS — L89322 Pressure ulcer of left buttock, stage 2: Secondary | ICD-10-CM | POA: Diagnosis not present

## 2018-02-05 DIAGNOSIS — C220 Liver cell carcinoma: Secondary | ICD-10-CM | POA: Diagnosis not present

## 2018-02-05 DIAGNOSIS — C3411 Malignant neoplasm of upper lobe, right bronchus or lung: Secondary | ICD-10-CM | POA: Diagnosis not present

## 2018-02-05 DIAGNOSIS — B1920 Unspecified viral hepatitis C without hepatic coma: Secondary | ICD-10-CM | POA: Diagnosis not present

## 2018-02-05 DIAGNOSIS — M359 Systemic involvement of connective tissue, unspecified: Secondary | ICD-10-CM | POA: Diagnosis not present

## 2018-02-05 DIAGNOSIS — Z77090 Contact with and (suspected) exposure to asbestos: Secondary | ICD-10-CM | POA: Diagnosis not present

## 2018-02-05 DIAGNOSIS — Z87891 Personal history of nicotine dependence: Secondary | ICD-10-CM | POA: Diagnosis not present

## 2018-02-05 DIAGNOSIS — R0600 Dyspnea, unspecified: Secondary | ICD-10-CM | POA: Diagnosis not present

## 2018-02-05 DIAGNOSIS — C7951 Secondary malignant neoplasm of bone: Secondary | ICD-10-CM | POA: Diagnosis not present

## 2018-02-05 DIAGNOSIS — M069 Rheumatoid arthritis, unspecified: Secondary | ICD-10-CM | POA: Diagnosis not present

## 2018-02-05 MED ORDER — MIRTAZAPINE 7.5 MG PO TABS: 7.5000 mg | ORAL_TABLET | Freq: Every day | ORAL | 1 refills | Status: AC

## 2018-02-05 NOTE — Telephone Encounter (Signed)
I agree with the Remeron 7.5 mg qhs. This medication will help with mood, sleep, and potentially improve his appetite. I will send in the Rx now. Regarding his oral chemotherapy. Mr. Pettis is terminal. His oral chemo therapy is not going to prolong his life. While his LFTs are better, I would be hard pressed to say that it is because of the lenvatinib. 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 He refuses the ordered anticoagulation. We have tried to discuss hospice with him, however he remains hopeful that the medication will give him additional time. From a medical standpoint, hospice services is the logical way to proceed. They will ensure pain and symptom management needs are met, and promote dignified end of life care. Ultimately it is his decision. We will have to support his decision and continue to reevaluate his [place], as I am sure they will continue to change as his condition deteriorates.

## 2018-02-05 NOTE — Telephone Encounter (Signed)
Gerald [name] with Palliative care called asking for order for Mirtazapine 15 mg sig half tablet every hs PO. She discussed with him Hospice but he told her that physician said his cancer is better and that his liver tests are better too so why would he stop oral chemotherapy. From what I read in last office note, this is not the case. Please advise on Mirtazapine

## 2018-02-07 ENCOUNTER — Emergency Department (HOSPITAL_COMMUNITY): Payer: Medicare Other

## 2018-02-07 ENCOUNTER — Inpatient Hospital Stay (HOSPITAL_COMMUNITY)
Admission: EM | Admit: 2018-02-07 | Discharge: 2018-03-11 | DRG: 100 | Disposition: E | Payer: Medicare Other | Attending: Family Medicine | Admitting: Family Medicine

## 2018-02-07 ENCOUNTER — Other Ambulatory Visit: Payer: Self-pay

## 2018-02-07 DIAGNOSIS — G40901 Epilepsy, unspecified, not intractable, with status epilepticus: Secondary | ICD-10-CM | POA: Diagnosis not present

## 2018-02-07 DIAGNOSIS — Z515 Encounter for palliative care: Secondary | ICD-10-CM | POA: Diagnosis not present

## 2018-02-07 DIAGNOSIS — M069 Rheumatoid arthritis, unspecified: Secondary | ICD-10-CM | POA: Diagnosis present

## 2018-02-07 DIAGNOSIS — R220 Localized swelling, mass and lump, head: Secondary | ICD-10-CM | POA: Diagnosis not present

## 2018-02-07 DIAGNOSIS — R0682 Tachypnea, not elsewhere classified: Secondary | ICD-10-CM

## 2018-02-07 DIAGNOSIS — R402113 Coma scale, eyes open, never, at hospital admission: Secondary | ICD-10-CM | POA: Diagnosis present

## 2018-02-07 DIAGNOSIS — G936 Cerebral edema: Secondary | ICD-10-CM | POA: Diagnosis not present

## 2018-02-07 DIAGNOSIS — R402313 Coma scale, best motor response, none, at hospital admission: Secondary | ICD-10-CM | POA: Diagnosis present

## 2018-02-07 DIAGNOSIS — C3490 Malignant neoplasm of unspecified part of unspecified bronchus or lung: Secondary | ICD-10-CM | POA: Diagnosis present

## 2018-02-07 DIAGNOSIS — Z66 Do not resuscitate: Secondary | ICD-10-CM | POA: Diagnosis present

## 2018-02-07 DIAGNOSIS — C220 Liver cell carcinoma: Secondary | ICD-10-CM | POA: Diagnosis present

## 2018-02-07 DIAGNOSIS — Z87891 Personal history of nicotine dependence: Secondary | ICD-10-CM

## 2018-02-07 DIAGNOSIS — C7931 Secondary malignant neoplasm of brain: Secondary | ICD-10-CM | POA: Diagnosis present

## 2018-02-07 DIAGNOSIS — H5500 Unspecified nystagmus: Secondary | ICD-10-CM | POA: Diagnosis present

## 2018-02-07 DIAGNOSIS — B1910 Unspecified viral hepatitis B without hepatic coma: Secondary | ICD-10-CM | POA: Diagnosis present

## 2018-02-07 DIAGNOSIS — F05 Delirium due to known physiological condition: Secondary | ICD-10-CM | POA: Diagnosis not present

## 2018-02-07 DIAGNOSIS — B1920 Unspecified viral hepatitis C without hepatic coma: Secondary | ICD-10-CM | POA: Diagnosis present

## 2018-02-07 DIAGNOSIS — Z801 Family history of malignant neoplasm of trachea, bronchus and lung: Secondary | ICD-10-CM

## 2018-02-07 DIAGNOSIS — Z808 Family history of malignant neoplasm of other organs or systems: Secondary | ICD-10-CM

## 2018-02-07 DIAGNOSIS — M6281 Muscle weakness (generalized): Secondary | ICD-10-CM | POA: Diagnosis not present

## 2018-02-07 DIAGNOSIS — R402213 Coma scale, best verbal response, none, at hospital admission: Secondary | ICD-10-CM | POA: Diagnosis present

## 2018-02-07 DIAGNOSIS — R200 Anesthesia of skin: Secondary | ICD-10-CM | POA: Diagnosis not present

## 2018-02-07 LAB — I-STAT CHEM 8, ED
BUN: 23 mg/dL — ABNORMAL HIGH (ref 6–20)
CREATININE: 0.7 mg/dL (ref 0.61–1.24)
Calcium, Ion: 1.18 mmol/L (ref 1.15–1.40)
Chloride: 98 mmol/L — ABNORMAL LOW (ref 101–111)
Glucose, Bld: 99 mg/dL (ref 65–99)
HEMATOCRIT: 40 % (ref 39.0–52.0)
HEMOGLOBIN: 13.6 g/dL (ref 13.0–17.0)
POTASSIUM: 5 mmol/L (ref 3.5–5.1)
Sodium: 135 mmol/L (ref 135–145)
TCO2: 25 mmol/L (ref 22–32)

## 2018-02-07 LAB — CBC
HCT: 37.9 % — ABNORMAL LOW (ref 39.0–52.0)
Hemoglobin: 13.2 g/dL (ref 13.0–17.0)
MCH: 36.8 pg — ABNORMAL HIGH (ref 26.0–34.0)
MCHC: 34.8 g/dL (ref 30.0–36.0)
MCV: 105.6 fL — AB (ref 78.0–100.0)
PLATELETS: 128 10*3/uL — AB (ref 150–400)
RBC: 3.59 MIL/uL — ABNORMAL LOW (ref 4.22–5.81)
RDW: 17.5 % — ABNORMAL HIGH (ref 11.5–15.5)
WBC: 8.8 10*3/uL (ref 4.0–10.5)

## 2018-02-07 LAB — I-STAT TROPONIN, ED: TROPONIN I, POC: 0 ng/mL (ref 0.00–0.08)

## 2018-02-07 LAB — DIFFERENTIAL
BASOS ABS: 0 10*3/uL (ref 0.0–0.1)
BASOS PCT: 0 %
Eosinophils Absolute: 0 10*3/uL (ref 0.0–0.7)
Eosinophils Relative: 0 %
Lymphocytes Relative: 6 %
Lymphs Abs: 0.6 10*3/uL — ABNORMAL LOW (ref 0.7–4.0)
MONO ABS: 1.1 10*3/uL — AB (ref 0.1–1.0)
Monocytes Relative: 13 %
NEUTROS ABS: 7.1 10*3/uL (ref 1.7–7.7)
NEUTROS PCT: 81 %

## 2018-02-07 MED ORDER — SODIUM CHLORIDE 0.9 % IV SOLN
1000.0000 mg | Freq: Once | INTRAVENOUS | Status: AC
  Administered 2018-02-07: 1000 mg via INTRAVENOUS
  Filled 2018-02-07: qty 10

## 2018-02-08 ENCOUNTER — Telehealth: Payer: Self-pay | Admitting: *Deleted

## 2018-02-08 ENCOUNTER — Encounter (HOSPITAL_COMMUNITY): Payer: Self-pay

## 2018-02-08 DIAGNOSIS — Z515 Encounter for palliative care: Secondary | ICD-10-CM

## 2018-02-08 DIAGNOSIS — B1910 Unspecified viral hepatitis B without hepatic coma: Secondary | ICD-10-CM | POA: Diagnosis present

## 2018-02-08 DIAGNOSIS — G40901 Epilepsy, unspecified, not intractable, with status epilepticus: Secondary | ICD-10-CM | POA: Diagnosis not present

## 2018-02-08 DIAGNOSIS — C7931 Secondary malignant neoplasm of brain: Secondary | ICD-10-CM | POA: Diagnosis not present

## 2018-02-08 DIAGNOSIS — C3490 Malignant neoplasm of unspecified part of unspecified bronchus or lung: Secondary | ICD-10-CM

## 2018-02-08 DIAGNOSIS — Z66 Do not resuscitate: Secondary | ICD-10-CM | POA: Diagnosis present

## 2018-02-08 DIAGNOSIS — H5500 Unspecified nystagmus: Secondary | ICD-10-CM | POA: Diagnosis present

## 2018-02-08 DIAGNOSIS — R402113 Coma scale, eyes open, never, at hospital admission: Secondary | ICD-10-CM | POA: Diagnosis present

## 2018-02-08 DIAGNOSIS — Z7189 Other specified counseling: Secondary | ICD-10-CM

## 2018-02-08 DIAGNOSIS — R200 Anesthesia of skin: Secondary | ICD-10-CM | POA: Diagnosis not present

## 2018-02-08 DIAGNOSIS — R402313 Coma scale, best motor response, none, at hospital admission: Secondary | ICD-10-CM | POA: Diagnosis present

## 2018-02-08 DIAGNOSIS — Z801 Family history of malignant neoplasm of trachea, bronchus and lung: Secondary | ICD-10-CM | POA: Diagnosis not present

## 2018-02-08 DIAGNOSIS — M069 Rheumatoid arthritis, unspecified: Secondary | ICD-10-CM | POA: Diagnosis present

## 2018-02-08 DIAGNOSIS — Z87891 Personal history of nicotine dependence: Secondary | ICD-10-CM | POA: Diagnosis not present

## 2018-02-08 DIAGNOSIS — C220 Liver cell carcinoma: Secondary | ICD-10-CM | POA: Diagnosis present

## 2018-02-08 DIAGNOSIS — B1920 Unspecified viral hepatitis C without hepatic coma: Secondary | ICD-10-CM | POA: Diagnosis present

## 2018-02-08 DIAGNOSIS — R402213 Coma scale, best verbal response, none, at hospital admission: Secondary | ICD-10-CM | POA: Diagnosis present

## 2018-02-08 DIAGNOSIS — Z808 Family history of malignant neoplasm of other organs or systems: Secondary | ICD-10-CM | POA: Diagnosis not present

## 2018-02-08 DIAGNOSIS — G936 Cerebral edema: Secondary | ICD-10-CM | POA: Diagnosis present

## 2018-02-08 DIAGNOSIS — R0682 Tachypnea, not elsewhere classified: Secondary | ICD-10-CM | POA: Diagnosis not present

## 2018-02-08 LAB — COMPREHENSIVE METABOLIC PANEL
ALBUMIN: 1.7 g/dL — AB (ref 3.5–5.0)
ALK PHOS: 135 U/L — AB (ref 38–126)
ALT: 37 U/L (ref 17–63)
AST: 102 U/L — ABNORMAL HIGH (ref 15–41)
Anion gap: 11 (ref 5–15)
BUN: 22 mg/dL — ABNORMAL HIGH (ref 6–20)
CALCIUM: 9 mg/dL (ref 8.9–10.3)
CHLORIDE: 97 mmol/L — AB (ref 101–111)
CO2: 22 mmol/L (ref 22–32)
Creatinine, Ser: 0.96 mg/dL (ref 0.61–1.24)
GFR calc non Af Amer: >60 mL/min (ref >60)
GLUCOSE: 104 mg/dL — AB (ref 65–99)
Potassium: 5 mmol/L (ref 3.5–5.1)
SODIUM: 130 mmol/L — AB (ref 135–145)
Total Bilirubin: 5.4 mg/dL — ABNORMAL HIGH (ref 0.3–1.2)
Total Protein: 8.2 g/dL — ABNORMAL HIGH (ref 6.5–8.1)

## 2018-02-08 LAB — AMMONIA: Ammonia: 91 umol/L — ABNORMAL HIGH (ref 9–35)

## 2018-02-08 LAB — PROTIME-INR
INR: 1.54
PROTHROMBIN TIME: 18.3 s — AB (ref 11.4–15.2)

## 2018-02-08 LAB — APTT: aPTT: 39 seconds — ABNORMAL HIGH (ref 24–36)

## 2018-02-08 LAB — ETHANOL

## 2018-02-08 MED ORDER — MORPHINE SULFATE (PF) 4 MG/ML IV SOLN
1.0000 mg | INTRAVENOUS | Status: DC | PRN
  Administered 2018-02-08 – 2018-02-09 (×5): 1 mg via INTRAVENOUS
  Filled 2018-02-08 (×5): qty 1

## 2018-02-08 MED ORDER — PHENYTOIN SODIUM 50 MG/ML IJ SOLN
1200.0000 mg | Freq: Once | INTRAMUSCULAR | Status: AC
  Administered 2018-02-08: 1200 mg via INTRAVENOUS
  Filled 2018-02-08: qty 24

## 2018-02-08 MED ORDER — LORAZEPAM 2 MG/ML IJ SOLN
1.0000 mg | INTRAMUSCULAR | Status: DC | PRN
  Administered 2018-02-08 – 2018-02-09 (×3): 1 mg via INTRAVENOUS
  Filled 2018-02-08 (×3): qty 1

## 2018-02-08 MED ORDER — GLYCOPYRROLATE 0.2 MG/ML IJ SOLN: 0.2000 mg | INTRAMUSCULAR | Status: DC | PRN

## 2018-02-08 MED ORDER — SODIUM CHLORIDE 0.9 % IV SOLN
250.0000 mL | INTRAVENOUS | Status: DC | PRN
  Administered 2018-02-10: 250 mL via INTRAVENOUS

## 2018-02-08 MED ORDER — GLYCOPYRROLATE 1 MG PO TABS
1.0000 mg | ORAL_TABLET | ORAL | Status: DC | PRN
  Filled 2018-02-08: qty 1

## 2018-02-08 MED ORDER — MORPHINE SULFATE (PF) 4 MG/ML IV SOLN
4.0000 mg | Freq: Once | INTRAVENOUS | Status: AC
  Administered 2018-02-08: 4 mg via INTRAVENOUS
  Filled 2018-02-08: qty 1

## 2018-02-08 MED ORDER — ACETAMINOPHEN 650 MG RE SUPP: 650.0000 mg | Freq: Four times a day (QID) | RECTAL | Status: DC | PRN

## 2018-02-08 MED ORDER — LORAZEPAM 1 MG PO TABS: 1.0000 mg | ORAL_TABLET | ORAL | Status: DC | PRN

## 2018-02-08 MED ORDER — MORPHINE SULFATE (CONCENTRATE) 10 MG/0.5ML PO SOLN: 5.0000 mg | ORAL | Status: DC | PRN

## 2018-02-08 MED ORDER — ONDANSETRON 4 MG PO TBDP: 4.0000 mg | ORAL_TABLET | Freq: Four times a day (QID) | ORAL | Status: DC | PRN

## 2018-02-08 MED ORDER — HALOPERIDOL LACTATE 5 MG/ML IJ SOLN: 0.5000 mg | INTRAMUSCULAR | Status: DC | PRN

## 2018-02-08 MED ORDER — ACETAMINOPHEN 325 MG PO TABS: 650.0000 mg | ORAL_TABLET | Freq: Four times a day (QID) | ORAL | Status: DC | PRN

## 2018-02-08 MED ORDER — ONDANSETRON HCL 4 MG/2ML IJ SOLN: 4.0000 mg | Freq: Four times a day (QID) | INTRAMUSCULAR | Status: DC | PRN

## 2018-02-08 MED ORDER — HALOPERIDOL 1 MG PO TABS: 0.5000 mg | ORAL_TABLET | ORAL | Status: DC | PRN

## 2018-02-08 MED ORDER — LORAZEPAM 2 MG/ML PO CONC: 1.0000 mg | ORAL | Status: DC | PRN

## 2018-02-08 MED ORDER — BIOTENE DRY MOUTH MT LIQD: 15.0000 mL | OROMUCOSAL | Status: DC | PRN

## 2018-02-08 MED ORDER — LORAZEPAM 2 MG/ML IJ SOLN
1.0000 mg | Freq: Once | INTRAMUSCULAR | Status: AC
  Administered 2018-02-08: 1 mg via INTRAVENOUS

## 2018-02-08 MED ORDER — SODIUM CHLORIDE 0.9% FLUSH: 3.0000 mL | INTRAVENOUS | Status: DC | PRN

## 2018-02-08 MED ORDER — POLYVINYL ALCOHOL 1.4 % OP SOLN
1.0000 [drp] | Freq: Four times a day (QID) | OPHTHALMIC | Status: DC | PRN
  Filled 2018-02-08: qty 15

## 2018-02-08 MED ORDER — SODIUM CHLORIDE 0.9% FLUSH
3.0000 mL | Freq: Two times a day (BID) | INTRAVENOUS | Status: DC
  Administered 2018-02-08 – 2018-02-10 (×5): 3 mL via INTRAVENOUS

## 2018-02-08 MED ORDER — LORAZEPAM 2 MG/ML IJ SOLN
1.0000 mg | Freq: Once | INTRAMUSCULAR | Status: AC
  Administered 2018-02-07: 1 mg via INTRAVENOUS

## 2018-02-08 MED ORDER — HALOPERIDOL LACTATE 2 MG/ML PO CONC
0.5000 mg | ORAL | Status: DC | PRN
  Filled 2018-02-08: qty 0.3

## 2018-02-08 MED ORDER — ONDANSETRON HCL 4 MG/2ML IJ SOLN
4.0000 mg | Freq: Once | INTRAMUSCULAR | Status: AC
  Administered 2018-02-08: 4 mg via INTRAVENOUS
  Filled 2018-02-08: qty 2

## 2018-02-08 MED ORDER — SODIUM CHLORIDE 0.9 % IV BOLUS (SEPSIS)
1000.0000 mL | Freq: Once | INTRAVENOUS | Status: AC
  Administered 2018-02-08: 1000 mL via INTRAVENOUS

## 2018-02-08 MED ORDER — GLYCOPYRROLATE 0.2 MG/ML IJ SOLN
0.2000 mg | INTRAMUSCULAR | Status: DC | PRN
  Administered 2018-02-09: 0.2 mg via INTRAVENOUS
  Filled 2018-02-08: qty 1

## 2018-02-08 NOTE — ED Notes (Signed)
Back to room from CT  

## 2018-02-08 NOTE — ED Notes (Signed)
Dr. Leonides Schanz and Dr. Cheral Marker at bedside speaking with family and POA's over the phone and at bedside. Family aware of pt status and decisions to be made for continued care and management of pt.

## 2018-02-08 NOTE — Progress Notes (Signed)
Patient seen and input from palliative care and neurology much appreciated Searching for freestanding hospice and [facility] should patient be on the next 24 hours Comfort measures  Verneita Griffes, MD [facility] [phone]

## 2018-02-08 NOTE — Progress Notes (Signed)
Nutrition Brief Note  Chart reviewed. Pt now transitioning to comfort care.  No further nutrition interventions warranted at this time.  Please re-consult as needed.   Ayinde Swim A. Annaliah Rivenbark, RD, LDN, CDE Pager: [phone] After hours Pager: [phone]  

## 2018-02-08 NOTE — H&P (Signed)
History and Physical    Gerald [name] URK:[id number] DOB: 1951/03/05 DOA: 01/14/2018  PCP: Patient, No Pcp Per   Patient coming from: Home  Chief Complaint: Unresponsive, seizure-like activity   HPI: Gerald [name] is a 67 y.o. male with medical history significant for hepatitis B and C, rheumatoid arthritis, adenocarcinoma of the lung with metastases, and also hepatocellular carcinoma, now presenting to the emergency department with unresponsiveness and seizure-like activity.  Patient has been under the care of oncology who described in their notes that his condition is terminal and has not responded to therapies.  Patient recently elected to be DNR/DNI and was referred for hospice by his oncologist.  He was noted to have some shaking at home today, would answer "yes" to some questions, but was otherwise not interacting.  Family left briefly to go shopping, and returned to find the patient on the floor next to his bed completely unresponsive.  EMS was called and they noted some seizure-like activity.  He was said to have a rightward gaze preference and was not moving his left side.  He was brought into the ED as a code stroke.  ED Course: Upon arrival to the ED, patient is found to be unable to communicate or follow commands, noted to have twitching of the left upper and lower extremities, and a left beating nystagmus.  He was treated with Ativan head CT was obtained, with findings consistent with a brain metastasis and vasogenic edema.  The twitching resumed and he was loaded with Keppra in the ED.  Twitching continued and Dilantin was administered.  Family, including POA, conferred and were in agreement that we should focus only on keeping the patient comfortable.  We discussed keeping the patient in the hospital overnight, using analgesia and anxiolytics as needed to prevent any suffering, and consult with palliative care for possible transition to hospice if he survives the night.  Family, including  POA are in agreement with this plan.  Patient will be observed on the medical-surgical unit for end-of-life care.  Review of Systems:  All other systems reviewed and apart from HPI, are negative.  Past Medical History:  Diagnosis Date  . Arthritis    Rheumatoid arthritis  . Cancer ([place])    lung ca   . Collagen vascular disease (H[facility])    RA  . Dyspnea   . Hepatitis    b and c  . Mass of lung   . Pneumonia   . Pneumothorax    spontaneous  . Renal disorder    as a child    Past Surgical History:  Procedure Laterality Date  . FLEXIBLE BRONCHOSCOPY N/A 03/30/2016   Procedure: FLEXIBLE BRONCHOSCOPY;  Surgeon: Vilinda Boehringer, MD;  Location: [facility] ORS;  Service: Cardiopulmonary;  Laterality: N/A;  . IR ANGIOGRAM SELECTIVE EACH ADDITIONAL VESSEL  07/24/2017  . IR ANGIOGRAM SELECTIVE EACH ADDITIONAL VESSEL  07/24/2017  . IR ANGIOGRAM SELECTIVE EACH ADDITIONAL VESSEL  07/24/2017  . IR ANGIOGRAM SELECTIVE EACH ADDITIONAL VESSEL  07/24/2017  . IR ANGIOGRAM SELECTIVE EACH ADDITIONAL VESSEL  07/24/2017  . IR ANGIOGRAM SELECTIVE EACH ADDITIONAL VESSEL  07/24/2017  . IR ANGIOGRAM SELECTIVE EACH ADDITIONAL VESSEL  07/24/2017  . IR ANGIOGRAM SELECTIVE EACH ADDITIONAL VESSEL  08/20/2017  . IR ANGIOGRAM VISCERAL SELECTIVE  07/24/2017  . IR ANGIOGRAM VISCERAL SELECTIVE  08/20/2017  . IR EMBO ARTERIAL NOT HEMORR HEMANG INC GUIDE ROADMAPPING  07/24/2017  . IR EMBO TUMOR ORGAN ISCHEMIA INFARCT INC GUIDE ROADMAPPING  08/20/2017  . IR  RADIOLOGIST EVAL & MGMT  07/11/2017  . IR US GUIDE VASC ACCESS RIGHT  07/24/2017  . Ureter repair as a child       reports that he quit smoking about 17 years ago. His smoking use included cigarettes. He has a 13.00 pack-year smoking history. he has never used smokeless tobacco. He reports that he drinks alcohol. He reports that he does not use drugs.  Allergies  Allergen Reactions  . Nsaids Other (See Comments)    Can take NSAIDs short term but cannot be on it long term d/t renal  insuff  . Sulfa Antibiotics Other (See Comments)    Reaction:  GI upset     Family History  Problem Relation Age of Onset  . Brain cancer Mother   . Lung cancer Maternal Uncle   . Cancer Sister        Metastatic cancer- unknown primary     Prior to Admission medications   Medication Sig Start Date End Date Taking? Authorizing Provider  lenvatinib 8 mg daily dose (LENVIMA) 4 (2) MG capsule Take 2 capsules (8 mg total) by mouth daily. 12/13/17  Yes Karen Kitchens, NP  morphine (MS CONTIN) 15 MG 12 hr tablet Take 1 tablet (15 mg total) by mouth every 12 (twelve) hours. 02/04/18  Yes Karen Kitchens, NP  ondansetron (ZOFRAN) 4 MG tablet Take 1 tablet (4 mg total) every 8 (eight) hours as needed by mouth for nausea or vomiting. 10/29/17  Yes Karen Kitchens, NP  oxyCODONE (OXY IR/ROXICODONE) 5 MG immediate release tablet Take 1-2 tablets every 4 hours as needed for pain 02/01/18  Yes Karen Kitchens, NP  mirtazapine (REMERON) 7.5 MG tablet Take 1 tablet (7.5 mg total) by mouth at bedtime. Patient not taking: Reported on 02/08/2018 02/05/18   Karen Kitchens, NP  rivaroxaban (XARELTO) 20 MG TABS tablet Take 1 tablet (20 mg total) by mouth daily with supper. Patient not taking: Reported on 11/30/2017 12/19/17 11/30/17  Bettey Costa, MD    Physical Exam: Vitals:   02/08/18 0100 02/08/18 0115 02/08/18 0130 02/08/18 0145  BP: 101/70 90/67 (!) 87/61 (!) 85/60  Pulse: (!) 114 (!) 102 100 100  Resp: (!) 28 20 (!) 24 (!) 22  Temp:      TempSrc:      SpO2: 96% 99% 98% 99%      Constitutional: cachectic, obtunded  Eyes: PERTLA, lids and conjunctivae normal ENMT: Mucous membranes are moist. Posterior pharynx clear of any exudate or lesions.   Neck: normal, supple, no masses, no thyromegaly Respiratory: Symmetric chest wall expansion. No accessory muscle use.  Cardiovascular: Rate ~110 and regular. No extremity edema.   Abdomen: Distended. Bowel sounds active.  Musculoskeletal: no clubbing / cyanosis. No  joint deformity upper and lower extremities.    Skin: no significant rashes, lesions, ulcers. Warm, dry, well-perfused. Neurologic: obtunded. No apparent seizure activity.    Labs on Admission: I have personally reviewed following labs and imaging studies  CBC: Recent Labs  Lab 01/27/2018 2336 01/11/2018 2344  WBC 8.8  --   NEUTROABS 7.1  --   HGB 13.2 13.6  HCT 37.9* 40.0  MCV 105.6*  --   PLT 128*  --    Basic Metabolic Panel: Recent Labs  Lab 01/17/2018 2336 01/19/2018 2344  NA 130* 135  K 5.0 5.0  CL 97* 98*  CO2 22  --   GLUCOSE 104* 99  BUN 22* 23*  CREATININE 0.96 0.70  CALCIUM 9.0  --  GFR: Estimated Creatinine Clearance: 76.6 mL/min (by C-G formula based on SCr of 0.7 mg/dL). Liver Function Tests: Recent Labs  Lab 01/31/2018 2336  AST 102*  ALT 37  ALKPHOS 135*  BILITOT 5.4*  PROT 8.2*  ALBUMIN 1.7*   No results for input(s): LIPASE, AMYLASE in the last 168 hours. Recent Labs  Lab 01/11/2018 2340  AMMONIA 91*   Coagulation Profile: Recent Labs  Lab 01/17/2018 2336  INR 1.54   Cardiac Enzymes: No results for input(s): CKTOTAL, CKMB, CKMBINDEX, TROPONINI in the last 168 hours. BNP (last 3 results) No results for input(s): PROBNP in the last 8760 hours. HbA1C: No results for input(s): HGBA1C in the last 72 hours. CBG: No results for input(s): GLUCAP in the last 168 hours. Lipid Profile: No results for input(s): CHOL, HDL, LDLCALC, TRIG, CHOLHDL, LDLDIRECT in the last 72 hours. Thyroid Function Tests: No results for input(s): TSH, T4TOTAL, FREET4, T3FREE, THYROIDAB in the last 72 hours. Anemia Panel: No results for input(s): VITAMINB12, FOLATE, FERRITIN, TIBC, IRON, RETICCTPCT in the last 72 hours. Urine analysis:    Component Value Date/Time   COLORURINE AMBER (A) 10/15/2017 1609   APPEARANCEUR CLEAR (A) 10/15/2017 1609   LABSPEC 1.025 10/15/2017 1609   PHURINE 5.0 10/15/2017 1609   GLUCOSEU NEGATIVE 10/15/2017 1609   HGBUR NEGATIVE  10/15/2017 1609   BILIRUBINUR NEGATIVE 10/15/2017 1609   KETONESUR NEGATIVE 10/15/2017 1609   PROTEINUR 100 (A) 10/15/2017 1609   NITRITE NEGATIVE 10/15/2017 1609   LEUKOCYTESUR NEGATIVE 10/15/2017 1609   Sepsis Labs: @LABRCNTIP (procalcitonin:4,lacticidven:4) )No results found for this or any previous visit (from the past 240 hour(s)).   Radiological Exams on Admission: Ct Cervical Spine Wo Contrast  Result Date: 02/08/2018 CLINICAL DATA:  Left side numbness. EXAM: CT CERVICAL SPINE WITHOUT CONTRAST TECHNIQUE: Multidetector CT imaging of the cervical spine was performed without intravenous contrast. Multiplanar CT image reconstructions were also generated. COMPARISON:  None. FINDINGS: Alignment: No subluxation. Skull base and vertebrae: No fracture Soft tissues and spinal canal: No prevertebral fluid or swelling. No visible canal hematoma. Disc levels:  Maintained Upper chest: Bullous emphysematous changes in the left apex. Other: No acute findings IMPRESSION: No acute bony abnormality in the cervical spine. Electronically Signed   By: Rolm Baptise M.D.   On: 02/08/2018 01:58   Ct Head Code Stroke Wo Contrast  Result Date: 02/08/2018 CLINICAL DATA:  Code stroke. 67 y/o M; left-sided weakness, numbness, right-sided gaze. Right lung cancer with history of metastasis. EXAM: CT HEAD WITHOUT CONTRAST TECHNIQUE: Contiguous axial images were obtained from the base of the skull through the vertex without intravenous contrast. COMPARISON:  08/16/2016 MRI of the head. FINDINGS: Brain: No findings of acute infarct, hemorrhage, hydrocephalus, or extra-axial collection. 2.1 cm peripherally dense and centrally lucent mass in the right occipital lobe, likely metastasis. Associated white matter hypoattenuation throughout right occipital and parietal lobe probably represents vasogenic edema. Stable small focus of encephalomalacia within the right anterolateral frontal lobe, likely sequelae of prior infarction. Mild  diffuse brain parenchymal volume loss. Vascular: Calcific atherosclerosis of carotid siphons. No hyperdense vessel identified. Skull: Normal. Negative for fracture or focal lesion. Sinuses/Orbits: Small right maxillary sinus fluid level. Otherwise negative. Other: None. ASPECTS [facility] Stroke Program Early CT Score) - Ganglionic level infarction (caudate, lentiform nuclei, internal capsule, insula, M1-M3 cortex): 7 - Supraganglionic infarction (M4-M6 cortex): 3 Total score (0-10 with 10 being normal): 10 IMPRESSION: 1. No acute stroke or hemorrhage identified. 2. ASPECTS is 10. 3. Right occipital lobe mass measuring 2.1  cm, likely metastasis. Vasogenic edema throughout right parietal and occipital lobes. These results were called by telephone at the time of interpretation on 02/08/2018 at 12:10 am to Dr. Pryor Curia , who verbally acknowledged these results. Electronically Signed   By: Kristine Garbe M.D.   On: 02/08/2018 00:11    EKG: Independently reviewed. Sinus tachycardia (rate 117).   Assessment/Plan   1. Seizures  - Presents in status epilepticus, found to have brain mass, likely metastatic  - Loaded with Keppra in ED, continued to seize, and loaded with Dilantin  - Family is aware that the patient did not want to be on life-support, oncologist recommended hospice, and family are in agreement that focusing on comfort only would be the best approach at this point  - Continue prn analgesia, prn anxiolytics, Ativan prn seizure  - Consult with palliative care     2. Metastatic lung cancer; hepatocellular carcinoma   - Per oncology notes, he has not responded to treatment, elected for DNR/DNI status, has been referred for hospice, and transitioned to a pain-management approach    DVT prophylaxis: None, comfort care only  Code Status: DNR  Family Communication: Children updated at bedside Disposition Plan: Observe on med-surg Consults called: Neurology Admission status: Observation      Vianne Bulls, MD [facility] Pager [phone]  If 7PM-7AM, please contact night-coverage [email or web address] Password [facility]  02/08/2018, 2:34 AM

## 2018-02-08 NOTE — ED Notes (Signed)
Spoke with Dr. Leonides Schanz about pt blood pressure of 87/61 and to continue with Morphine IV. Directs to give medication and monitor. Can stop IV fluids at this time.

## 2018-02-08 NOTE — Telephone Encounter (Signed)
If you speak to the family, please let them know that the thoughts of his oncology team are with them. Ask them to please let us know if there is anything they need now, or in the future.

## 2018-02-08 NOTE — ED Provider Notes (Signed)
TIME SEEN: 12:19 AM  CHIEF COMPLAINT: Code stroke  HPI: Patient is a 67 year old male with history of metastatic adenocarcinoma of the lung and hepatocellular carcinoma, hepatitis who presents to the emergency department as a code stroke.  Patient comes in with [facility].  Patient was last seen normal at 7:30 PM by his healthcare power of attorney [name].  They noticed that he was not acting normally and seemed to have some shaking in his left side.  EMS noted left-sided weakness with right gaze preference.  Blood glucose with EMS was 89.  Last blood pressure 116/80.  Patient has not been able to answer questions or follow commands.  EMS reports that he is only stating "yes".  Family did tell EMS that patient was recently started on morphine and placed on palliative care.  He is a DNR.  EMS gave Narcan in route intranasally without relief.  ROS: Level 5 caveat secondary to patient's condition  PAST MEDICAL HISTORY/PAST SURGICAL HISTORY:  Past Medical History:  Diagnosis Date  . Arthritis    Rheumatoid arthritis  . Cancer ([place])    lung ca   . Collagen vascular disease (HCC)    RA  . Dyspnea   . Hepatitis    b and c  . Mass of lung   . Pneumonia   . Pneumothorax    spontaneous  . Renal disorder    as a child    MEDICATIONS:  Prior to Admission medications   Medication Sig Start Date End Date Taking? Authorizing Provider  Calcium Carb-Cholecalciferol (CALCIUM-VITAMIN D) 500-200 MG-UNIT tablet Take 1 tablet by mouth 2 (two) times daily.    [provider]  lenvatinib 8 mg daily dose (LENVIMA) 4 (2) MG capsule Take 2 capsules (8 mg total) by mouth daily. 12/13/17   Karen Kitchens, NP  mirtazapine (REMERON) 7.5 MG tablet Take 1 tablet (7.5 mg total) by mouth at bedtime. 02/05/18   Karen Kitchens, NP  morphine (MS CONTIN) 15 MG 12 hr tablet Take 1 tablet (15 mg total) by mouth every 12 (twelve) hours. 02/04/18   Karen Kitchens, NP  ondansetron (ZOFRAN) 4 MG tablet Take 1 tablet (4  mg total) every 8 (eight) hours as needed by mouth for nausea or vomiting. Patient not taking: Reported on 12/06/2017 10/29/17   Karen Kitchens, NP  oxyCODONE (OXY IR/ROXICODONE) 5 MG immediate release tablet Take 1-2 tablets every 4 hours as needed for pain 02/01/18   Karen Kitchens, NP  rivaroxaban (XARELTO) 20 MG TABS tablet Take 1 tablet (20 mg total) by mouth daily with supper. Patient not taking: Reported on 11/30/2017 12/19/17 11/30/17  Bettey Costa, MD    ALLERGIES:  Allergies  Allergen Reactions  . Nsaids Other (See Comments)    Can take NSAIDs short term but cannot be on it long term d/t renal insuff  . Sulfa Antibiotics Other (See Comments)    Reaction:  GI upset     SOCIAL HISTORY:  Social History   Tobacco Use  . Smoking status: Former Smoker    Packs/day: 0.50    Years: 26.00    Pack years: 13.00    Types: Cigarettes    Last attempt to quit: 06/08/2000    Years since quitting: 17.6  . Smokeless tobacco: Never Used  . Tobacco comment: Quit about 5 years ago  Substance Use Topics  . Alcohol use: Yes    Alcohol/week: 0.0 oz    Comment: occasionally beer    FAMILY  HISTORY: Family History  Problem Relation Age of Onset  . Brain cancer Mother   . Lung cancer Maternal Uncle   . Cancer Sister        Metastatic cancer- unknown primary    EXAM: BP 119/83 (BP Location: Right Arm)   Pulse (!) 120   Temp (!) 97.5 F (36.4 C) (Axillary)   Resp (!) 27   SpO2 92%  CONSTITUTIONAL: Alert but does not answer questions or follow commands.  Chronically ill-appearing.  Cachectic. HEAD: Normocephalic EYES: Conjunctivae clear, pupils appear equal, EOMI ENT: normal nose; moist mucous membranes NECK: Supple, no meningismus, no nuchal rigidity, no LAD  CARD: RRR; S1 and S2 appreciated; no murmurs, no clicks, no rubs, no gallops RESP: Normal chest excursion without splinting or tachypnea; breath sounds clear and equal bilaterally; no wheezes, no rhonchi, no rales, no hypoxia or  respiratory distress, speaking full sentences ABD/GI: Normal bowel sounds; slightly distended; soft, non-tender, no rebound, no guarding, no peritoneal signs, no hepatosplenomegaly BACK:  The back appears normal and is non-tender to palpation, there is no CVA tenderness EXT: Normal ROM in all joints; non-tender to palpation; no edema; normal capillary refill; no cyanosis, no calf tenderness or swelling    SKIN: Normal color for age and race; warm; no rash NEURO: Patient has twitching in the left upper extremity.  He has a right gaze preference.  He does not answer questions or follow commands.  MEDICAL DECISION MAKING: Patient here as a code stroke.  Patient is a DNR.  Blood glucose normal.  Taken immediately to CT scan which appears to show metastasis to the brain.  Appears to be in status epilepticus.  Symptoms of partial seizure of the left side not improving after IV Ativan or IV Keppra.  Dr. Cheral Marker with neurology at bedside.  He recommends giving another milligram of IV Ativan and starting IV Dilantin.  We will also discuss goals of care with family.  ED PROGRESS: Goals of care discussed with patient's cousin June, patient's son Merrily Pew and patient's power of attorney [name].  They all agree that patient would not want to be intubated.  Given they do not want the patient intubated he unfortunately will not be able to give him sedative medications to stop his status epilepticus.  At this time they would like to hold off on any further intervention and keep the patient comfortable.  They confirm the patient is a DNR, DNI and they do not want any escalation of care.  They understand that the patient will die in the hospital without further treatment for his status epilepticus.  They state that the patient was recently placed on palliative care and has been taken off of chemotherapy by his oncologist.  They are comfortable with this plan for comfort care treatment only.   Labs show elevated liver  function tests and elevated ammonia level.  CT cervical spine shows no acute abnormality.  1:32 AM Discussed patient's case with hospitalist, Dr. Myna Hidalgo.  I have recommended admission and patient (and family if present) agree with this plan. Admitting physician will place admission orders.   I reviewed all nursing notes, vitals, pertinent previous records, EKGs, lab and urine results, imaging (as available).     EKG Interpretation  Date/Time:  Thursday February 07 2018 23:59:06 EST Ventricular Rate:  117 PR Interval:    QRS Duration: 57 QT Interval:  321 QTC Calculation: 448 R Axis:   57 Text Interpretation:  Sinus tachycardia Borderline T abnormalities, inferior leads No  significant change since last tracing Confirmed by Ward, Cyril Mourning [phone]) on 02/08/2018 12:21:22 AM         Ward, Delice Bison, DO 02/08/18 0401

## 2018-02-08 NOTE — ED Notes (Signed)
Family to bedside.

## 2018-02-08 NOTE — Consult Note (Addendum)
NEURO HOSPITALIST CONSULT NOTE   Requestig physician: Dr. Leonides Schanz  Reason for Consult: AMS with left sided weakness  History obtained from:  EMS and Chart     HPI:                                                                                                                                          Gerald [name] is an 67 y.o. male on palliative care at home who presents via EMS after family found him on the floor near his bed, severely confused. LKN was 7:30 PM with baseline of being able to converse normally with family members. At that time, he was abruptly noted to not be able to communicate normally, answering questions only monosyllabically. Family left him in bed to go shopping, thinking that he would get better on his own. When they got back, they found him on the floor near his bed, confused and unable to communicate. When EMS arrived and attempted to move him, they noted that he was not moving on his left side and had right gaze preference. Two bottles containing opiate medication were found at the bedside, so Narcan was administered, but with no effect. Code Stroke was initiated by EMS.   On arrival to the ED, he remained unable to communicate or follow commands, with eyes open. LUE and LLE twitching as well as left-beating nystagmus were seen on exam at the bridge. He was emergently transported to CT, where the twitching and nystagmus briefly resolved following 1 mg IV Ativan, then recurred. CT head showed a ring-shaped lesion in the right posterior parietal lobe with surrounding vasogenic edema. Code Stroke was cancelled. Keppra 1000 mg IV was then administered.   His PMHx includes lung cancer on chemotherapy, hepatitis B and C, and rheumatoid arthritis.  A recent telephone encounter note by his Oncology NP on 2/26 was reviewed: "Gerald [name] is terminal. His oral chemo therapy is not going to prolong his life. While his LFTs are better, I would be hard pressed to say that  it is because of the lenvatinib. 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 He refuses the ordered anticoagulation. We have tried to discuss hospice with him, however he remains hopeful that the medication will give him additional time. From a medical standpoint, hospice services is the logical way to proceed. They will ensure pain and symptom management needs are met, and promote dignified end of life care. Ultimately it is his decision. We will have to support his decision and continue to reevaluate his [name], as I am sure they will continue to change as his condition deteriorates."  Past Medical History:  Diagnosis Date  . Arthritis    Rheumatoid arthritis  . Cancer ([place])  lung ca   . Collagen vascular disease (HCC)    RA  . Dyspnea   . Hepatitis    b and c  . Mass of lung   . Pneumonia   . Pneumothorax    spontaneous  . Renal disorder    as a child    Past Surgical History:  Procedure Laterality Date  . FLEXIBLE BRONCHOSCOPY N/A 03/30/2016   Procedure: FLEXIBLE BRONCHOSCOPY;  Surgeon: Vilinda Boehringer, MD;  Location: [facility] ORS;  Service: Cardiopulmonary;  Laterality: N/A;  . IR ANGIOGRAM SELECTIVE EACH ADDITIONAL VESSEL  07/24/2017  . IR ANGIOGRAM SELECTIVE EACH ADDITIONAL VESSEL  07/24/2017  . IR ANGIOGRAM SELECTIVE EACH ADDITIONAL VESSEL  07/24/2017  . IR ANGIOGRAM SELECTIVE EACH ADDITIONAL VESSEL  07/24/2017  . IR ANGIOGRAM SELECTIVE EACH ADDITIONAL VESSEL  07/24/2017  . IR ANGIOGRAM SELECTIVE EACH ADDITIONAL VESSEL  07/24/2017  . IR ANGIOGRAM SELECTIVE EACH ADDITIONAL VESSEL  07/24/2017  . IR ANGIOGRAM SELECTIVE EACH ADDITIONAL VESSEL  08/20/2017  . IR ANGIOGRAM VISCERAL SELECTIVE  07/24/2017  . IR ANGIOGRAM VISCERAL SELECTIVE  08/20/2017  . IR EMBO ARTERIAL NOT HEMORR HEMANG INC GUIDE ROADMAPPING  07/24/2017  . IR EMBO TUMOR ORGAN ISCHEMIA INFARCT INC GUIDE ROADMAPPING  08/20/2017  . IR RADIOLOGIST EVAL & MGMT  07/11/2017  . IR US GUIDE VASC ACCESS  RIGHT  07/24/2017  . Ureter repair as a child      Family History  Problem Relation Age of Onset  . Brain cancer Mother   . Lung cancer Maternal Uncle   . Cancer Sister        Metastatic cancer- unknown primary   Social History:  reports that he quit smoking about 17 years ago. His smoking use included cigarettes. He has a 13.00 pack-year smoking history. he has never used smokeless tobacco. He reports that he drinks alcohol. He reports that he does not use drugs.  Allergies  Allergen Reactions  . Nsaids Other (See Comments)    Can take NSAIDs short term but cannot be on it long term d/t renal insuff  . Sulfa Antibiotics Other (See Comments)    Reaction:  GI upset     HOME MEDICATIONS:                                                                                                                      Note: Patient refusing to take Xarelto per chart review   ROS:  Unable to obtain due to AMS.   There were no vitals taken for this visit.   General Examination:                                                                                                      HEENT-  [place]/AT    Lungs- Labored respirations Abdomen- Distended Ext: Pitting edema bilateral LE  Neurological Examination Mental Status: Obtunded with eyes open. Not following commands or attempting to communicate. Does not track visual stimuli but will blink to threat. Will move RUE > LUE in response to sternal rub.  Cranial Nerves: II: Blinks to threat on the right but not the left. Right pupil 5 mm and sluggishly reactive, left 4 mm and sluggishly reactive.  III,IV, VI: Ptosis not present, eyes conjugately deviated approximately 4 mm to right of midline, with intermittent coarse left beating nystagmus.  V,VII: Face flaccidly symmetric with mouth open in context of labored breathing,  reacts to brow ridge pressure bilaterally VIII: Not responding to voice IX,X: Unable to assess XI: Head is turned to right.  XII: Unable to assess Motor: LUE and LLE: Non-rhythmic twitching occurs throughout the exam. Moves LUE 2/5 to noxious.  Localizes sternal rub with RUE, 3/5 strength.  Does not move either lower ext to noxious, with flaccid tone noted.  Sensory: Pinprick and light touch intact throughout, bilaterally Deep Tendon Reflexes: 1+ brachioradialis and patellae bilaterally. 0 achilles bilaterally Plantars: Mute bilaterally Cerebellar/Gait: Unable to assess   Lab Results: Basic Metabolic Panel: Recent Labs  Lab 01/19/2018 2344  NA 135  K 5.0  CL 98*  GLUCOSE 99  BUN 23*  CREATININE 0.70    CBC: Recent Labs  Lab 01/27/2018 2336 01/24/2018 2344  WBC 8.8  --   NEUTROABS 7.1  --   HGB 13.2 13.6  HCT 37.9* 40.0  MCV 105.6*  --   PLT 128*  --     Cardiac Enzymes: No results for input(s): CKTOTAL, CKMB, CKMBINDEX, TROPONINI in the last 168 hours.  Lipid Panel: No results for input(s): CHOL, TRIG, HDL, CHOLHDL, VLDL, LDLCALC in the last 168 hours.  Imaging: No results found.   CT head: 1. No acute stroke or hemorrhage identified. 2. ASPECTS is 10. 3. Right occipital lobe mass measuring 2.1 cm, likely metastasis. Vasogenic edema throughout right parietal and occipital lobes.  Assessment: 67 year old male with known diagnosis of lung cancer, presenting with AMS and left sided twitching 1. Most likely etiology for the patient's presentation is epileptic. He is likely in status. IV Ativan 1 mg decreased but did not halt activity. IV load of Keppra 1000 mg also has not resulted in cessation of twitching.  2. Low likelihood of stroke as the etiology for his presentation. Would not be a tPA candidate due to mass seen on CT head. Not an endovascular candidate due to terminal cancer diagnosis and seizure as the most likely etiology for his presentation.    Recommendations: 1. STAT load of Dilantin IV 20 mg/kg x 1.  2. Continue Keppra 500 mg BID IV 3. Additional  1 mg Ativan IV x 1 now. Continue Ativan PRN seizure 4. STAT LTM EEG and likely will need to be intubated with propofol or Versed sedation for burst suppression if twitching does  improving following Dilantin load 5. MRI brain with and without contrast  50 minutes spent in the emergent Neurological evaluation and management of this critically ill patient  Addendum: The family has elected comfort care only. Will not pursue LTM or brain MRI. Please call if there are additional questions.   Electronically signed: Dr. Kerney Elbe 02/08/2018, 12:02 AM

## 2018-02-08 NOTE — ED Notes (Signed)
Admitting md to bedside 

## 2018-02-08 NOTE — ED Notes (Signed)
Dr. Leonides Schanz aware pt back in room and Dr. Cheral Marker to start seizure management orders. Pt still having twitching/tremors to left side. MD aware.

## 2018-02-08 NOTE — Consult Note (Signed)
Consultation Note Date: 02/08/2018   Patient Name: Gerald [name]  DOB: [birth year]  MRN: [id number]  Age / Sex: 67 y.o., male  PCP: Patient, No Pcp Per Referring Physician: Nita Sells, MD  Reason for Consultation: Establishing goals of care, Hospice Evaluation, Inpatient hospice referral, Non pain symptom management, Pain control, Psychosocial/spiritual support and Terminal Care  HPI/Patient Profile: 67 y.o. male  with past medical history of hepatitis B, C, rheumatoid arthritis, stage IV lung cancer diagnosed approximately 2 years ago, hepatocellular carcinoma admitted on 01/31/2018 with altered mental status, seizures.  New metastatic disease to the brain was found per CT scan  Consult ordered for goals of care  Clinical Assessment and Goals of Care: Met with patient, patient's son, and daughter; chart reviewed.  Patient appears to be transitioning towards end of life.  He is no longer talking or taking anything by mouth. Patient is unmarried and has 2 biological sons who are the primary decision makers.  He also has a "an adopted daughter" with whom he lives and who was also at the bedside  They were all in agreement for full comfort care and would like to pursue residential hospice in [place]   For full comfort care, DNR/DNI Transfer to residential hospice in [place].  Consult placed to social work [place]:  DNR    Symptom Management:   Pain: Continue with morphine as needed.  Monitor and titrate for effect.  Dyspnea: Continue with targeted pulmonary treatments such as oxygen via nasal cannula ((do not escalate beyond the nasal cannula), low-dose opioids  Seizures: Continue with Keppra  Palliative Prophylaxis:   Aspiration, Bowel Regimen, Delirium Protocol, Eye Care, Frequent Pain Assessment, Oral Care and Turn  Reposition  Additional Recommendations (Limitations, Scope, Preferences):  Full Comfort Care  Psycho-social/Spiritual:   Desire for further Chaplaincy support:no  Additional Recommendations: Grief/Bereavement Support  Prognosis:   Hours - Days  Discharge Planning: Hospice facility      Primary Diagnoses: Present on Admission: **None**   I have reviewed the medical record, interviewed the patient and family, and examined the patient. The following aspects are pertinent.  Past Medical History:  Diagnosis Date  . Arthritis    Rheumatoid arthritis  . Cancer ([place])    lung ca   . Collagen vascular disease (HCC)    RA  . Dyspnea   . Hepatitis    b and c  . Mass of lung   . Pneumonia   . Pneumothorax    spontaneous  . Renal disorder    as a child   Social History   Socioeconomic History  . Marital status: Legally Separated    Spouse name: None  . Number of children: None  . Years of education: None  . Highest education level: None  Social Needs  . Financial resource strain: None  . Food insecurity - worry: None  . Food insecurity - inability: None  . Transportation needs - medical: None  . Transportation needs -  non-medical: None  Occupational History  . None  Tobacco Use  . Smoking status: Former Smoker    Packs/day: 0.50    Years: 26.00    Pack years: 13.00    Types: Cigarettes    Last attempt to quit: 06/08/2000    Years since quitting: 17.6  . Smokeless tobacco: Never Used  . Tobacco comment: Quit about 5 years ago  Substance and Sexual Activity  . Alcohol use: Yes    Alcohol/week: 0.0 oz    Comment: occasionally beer  . Drug use: No  . Sexual activity: None  Other Topics Concern  . None  Social History Narrative   Lives at home with son.   Family History  Problem Relation Age of Onset  . Brain cancer Mother   . Lung cancer Maternal Uncle   . Cancer Sister        Metastatic cancer- unknown primary   Scheduled Meds: . sodium chloride  flush  3 mL Intravenous Q12H   Continuous Infusions: . sodium chloride     PRN Meds:.sodium chloride, acetaminophen **OR** acetaminophen, antiseptic oral rinse, glycopyrrolate **OR** glycopyrrolate **OR** glycopyrrolate, haloperidol **OR** haloperidol **OR** haloperidol lactate, LORazepam **OR** LORazepam **OR** LORazepam, morphine injection, morphine CONCENTRATE **OR** morphine CONCENTRATE, ondansetron **OR** ondansetron (ZOFRAN) IV, polyvinyl alcohol, sodium chloride flush Medications Prior to Admission:  Prior to Admission medications   Medication Sig Start Date End Date Taking? Authorizing Provider  lenvatinib 8 mg daily dose (LENVIMA) 4 (2) MG capsule Take 2 capsules (8 mg total) by mouth daily. 12/13/17  Yes Karen Kitchens, NP  morphine (MS CONTIN) 15 MG 12 hr tablet Take 1 tablet (15 mg total) by mouth every 12 (twelve) hours. 02/04/18  Yes Karen Kitchens, NP  ondansetron (ZOFRAN) 4 MG tablet Take 1 tablet (4 mg total) every 8 (eight) hours as needed by mouth for nausea or vomiting. 10/29/17  Yes Karen Kitchens, NP  oxyCODONE (OXY IR/ROXICODONE) 5 MG immediate release tablet Take 1-2 tablets every 4 hours as needed for pain 02/01/18  Yes Karen Kitchens, NP  mirtazapine (REMERON) 7.5 MG tablet Take 1 tablet (7.5 mg total) by mouth at bedtime. Patient not taking: Reported on 02/08/2018 02/05/18   Karen Kitchens, NP  rivaroxaban (XARELTO) 20 MG TABS tablet Take 1 tablet (20 mg total) by mouth daily with supper. Patient not taking: Reported on 11/30/2017 12/19/17 11/30/17  Bettey Costa, MD   Allergies  Allergen Reactions  . Nsaids Other (See Comments)    Can take NSAIDs short term but cannot be on it long term d/t renal insuff  . Sulfa Antibiotics Other (See Comments)    Reaction:  GI upset    Review of Systems  Unable to perform ROS: Acuity of condition    Physical Exam  Constitutional:  Acutely ill, frail, older man.  Appears to be transitioning towards end of life, minimally responsive to voice  and light touch  HENT:  Poor wasting  Cardiovascular:  Pulses faint, weak  Pulmonary/Chest:  Respirations very shallow; brief periods of apnea  Neurological:  Minimally responsive.  Opens his eyes to light touch, nonverbal  Skin: Skin is warm and dry.  No mottling  Psychiatric:  Unable to test  Nursing note and vitals reviewed.   Vital Signs: BP 94/70 (BP Location: Right Arm)   Pulse 93   Temp (!) 97.4 F (36.3 C) (Oral)   Resp 17   SpO2 93%  Pain Assessment: PAINAD   Pain Score: 0-No pain  SpO2: SpO2: 93 % O2 Device:SpO2: 93 % O2 Flow Rate: .O2 Flow Rate (L/min): 2 L/min  IO: Intake/output summary:   Intake/Output Summary (Last 24 hours) at 02/08/2018 1205 Last data filed at 02/08/2018 0150 Gross per 24 hour  Intake 850 ml  Output -  Net 850 ml    LBM: Last BM Date: 02/08/18 Baseline Weight:   Most recent weight:       Palliative Assessment/Data:   Flowsheet Rows     Most Recent Value  Intake Tab  Referral Department  Hospitalist  Unit at Time of Referral  Med/Surg Unit  Palliative Care Primary Diagnosis  Cancer  Date Notified  02/08/18  Reason for referral  Clarify Goals of Care, Non-pain Symptom, Pain, Counsel Regarding Hospice, Psychosocial or Spiritual support  Date of Admission  01/24/2018  Date first seen by Palliative Care  02/08/18  # of days Palliative referral response time  0 Day(s)  # of days IP prior to Palliative referral  1  Clinical Assessment  Palliative Performance Scale Score  20%  Pain Max last 24 hours  Not able to report  Pain Min Last 24 hours  Not able to report  Dyspnea Max Last 24 Hours  Not able to report  Dyspnea Min Last 24 hours  Not able to report  Nausea Max Last 24 Hours  Not able to report  Nausea Min Last 24 Hours  Not able to report  Anxiety Max Last 24 Hours  Not able to report  Anxiety Min Last 24 Hours  Not able to report  Other Max Last 24 Hours  Not able to report  Psychosocial & Spiritual Assessment   Palliative Care Outcomes  Patient/Family meeting held?  Yes  Who was at the meeting?  pt's son and dtr  Patient/Family wishes: Interventions discontinued/not started   Mechanical Ventilation, BiPAP, Hemodialysis, Transfusion, Tube feedings/TPN, Antibiotics, PEG, Trach, NIPPV, Vasopressors  Palliative Care follow-up planned  Yes, Facility      Time In: 1045 Time Out: 1155 Time Total: 70 min Greater than 50%  of this time was spent counseling and coordinating care related to the above assessment and plan. Staffed with Dr. Verlon Au  Signed by: Dory Horn, NP   Please contact Palliative Medicine Team phone at [phone] for questions and concerns.  For individual provider: See Shea Evans

## 2018-02-08 NOTE — ED Triage Notes (Signed)
Pt here via [facility] c/o fall when called out. EMS found pt lying on ground and lethargic and unable to respond. Code stroke called in. Upon arrival, pt noted to have "tremors" or shaking to left arm and left leg, with right sided gaze. Unable to follow commands. Dr. Leonides Schanz and Dr. Cheral Marker at bedside. Pt to CT. Ativan 1mg  given IV before CT. Shaking to left side stopped while in CT. Code stroke canceled at [facility] by Dr. Cheral Marker.

## 2018-02-08 NOTE — Telephone Encounter (Signed)
Called to set up appointment to open to services and was advised that patient is in hospital at [facility] unconscious. Per chart, patient s/p seizure activity and is on comfort care.

## 2018-02-08 DEATH — deceased

## 2018-02-09 DIAGNOSIS — R0682 Tachypnea, not elsewhere classified: Secondary | ICD-10-CM

## 2018-02-09 MED ORDER — SODIUM CHLORIDE 0.9 % IV SOLN
2.0000 mg/h | INTRAVENOUS | Status: DC
  Administered 2018-02-09: 1 mg/h via INTRAVENOUS
  Filled 2018-02-09: qty 10

## 2018-02-09 MED ORDER — MORPHINE SULFATE (PF) 4 MG/ML IV SOLN
1.0000 mg | INTRAVENOUS | Status: DC
  Administered 2018-02-09: 1 mg via INTRAVENOUS
  Filled 2018-02-09: qty 1

## 2018-02-09 MED ORDER — GLYCOPYRROLATE 0.2 MG/ML IJ SOLN
0.2000 mg | Freq: Four times a day (QID) | INTRAMUSCULAR | Status: DC
  Administered 2018-02-09 – 2018-02-10 (×5): 0.2 mg via INTRAVENOUS
  Filled 2018-02-09 (×5): qty 1

## 2018-02-09 MED ORDER — MORPHINE BOLUS VIA INFUSION
2.0000 mg | INTRAVENOUS | Status: DC | PRN
  Administered 2018-02-10: 2 mg via INTRAVENOUS
  Filled 2018-02-09: qty 2

## 2018-02-09 MED ORDER — MORPHINE 100MG IN NS 100ML (1MG/ML) PREMIX INFUSION: 1.0000 mg/h | INTRAVENOUS | Status: DC

## 2018-02-09 MED ORDER — MORPHINE SULFATE (PF) 4 MG/ML IV SOLN
1.0000 mg | INTRAVENOUS | Status: DC | PRN
  Administered 2018-02-09 (×2): 1 mg via INTRAVENOUS
  Filled 2018-02-09 (×2): qty 1

## 2018-02-09 NOTE — Progress Notes (Signed)
Palliative Medicine RN Note: About 8 family members present. Discussion with them re: comfort.   Patient has gotten several doses of morphine since this am, and he has gotten Ativan. He remains dyspneic, and he is breathing about 30 times a minute. Family is very concerned that he is not comfortable, and he does appear to be suffering. I have paged PMT NP to adjust medications again.  Family is very clear that goals are comfort and dignity. They are from [place], and IF he stabilizes enough for transport to hospice, that would be their choice for inpt hospice. However, due to uncontrolled dyspnea and family's fear of the patient dying during transport, PMT does not support a transfer to hospice at this time.   We will follow up again tomorrow to re-assess.  Marjie Skiff Geovana Gebel, RN, BSN, [facility] Palliative Medicine Team 02/09/2018 1:03 PM Office [phone]

## 2018-02-09 NOTE — Progress Notes (Signed)
Daily Progress Note   Patient Name: Gerald [name]       Date: 02/09/2018 DOB: [birth year]  Age: 67 y.o. MRN#: [id number] Attending Physician: Nita Sells, MD Primary Care Physician: Patient, No Pcp Per Admit Date: 01/12/2018  Reason for Consultation/Follow-up: Establishing goals of care, Inpatient hospice referral, Non pain symptom management, Pain control and Psychosocial/spiritual support  Subjective: Patient seen, chart reviewed.  Patient on scheduled morphine but still quite tachypneic.  Family at the bedside and are worried that he is in distress  Length of Stay: 1  Current Medications: Scheduled Meds:  . glycopyrrolate  0.2 mg Intravenous Q6H  . sodium chloride flush  3 mL Intravenous Q12H    Continuous Infusions: . sodium chloride    . morphine 1 mg/ml infusion      PRN Meds: sodium chloride, acetaminophen **OR** acetaminophen, antiseptic oral rinse, glycopyrrolate **OR** glycopyrrolate **OR** glycopyrrolate, haloperidol **OR** haloperidol **OR** haloperidol lactate, LORazepam **OR** LORazepam **OR** LORazepam, morphine, ondansetron **OR** ondansetron (ZOFRAN) IV, polyvinyl alcohol, sodium chloride flush  Physical Exam  Constitutional:  Patient is actively dying Respiratory rate, secretions noted Cachexia  HENT:  Temporal wasting  Cardiovascular:  Irregular Pulses faint  Pulmonary/Chest:  Tachypnea; respiratory rate 30-32  Neurological:  Unresponsive  Skin: Skin is warm and dry. There is pallor.  Psychiatric:  Patient has had periods of crying out and moaning  Nursing note and vitals reviewed.           Vital Signs: BP (!) 91/55 (BP Location: Right Arm)   Pulse (!) 112   Temp 98.4 F (36.9 C) (Oral)   Resp 16   SpO2 91%  SpO2: SpO2: 91 % O2 Device:  O2 Device: Nasal Cannula O2 Flow Rate: O2 Flow Rate (L/min): 2 L/min  Intake/output summary:   Intake/Output Summary (Last 24 hours) at 02/09/2018 1512 Last data filed at 02/09/2018 1008 Gross per 24 hour  Intake 0 ml  Output 300 ml  Net -300 ml   LBM: Last BM Date: 02/08/18 Baseline Weight:   Most recent weight:         Palliative Assessment/Data:    Flowsheet Rows     Most Recent Value  Intake Tab  Referral Department  Hospitalist  Unit at Time of Referral  Med/Surg Unit  Palliative Care Primary Diagnosis  Cancer  Date Notified  02/08/18  Palliative Care Type  New Palliative care  Reason for referral  Clarify Goals of Care, Non-pain Symptom, Pain, Counsel Regarding Hospice, Psychosocial or Spiritual support  Date of Admission  01/26/2018  Date first seen by Palliative Care  02/08/18  # of days Palliative referral response time  0 Day(s)  # of days IP prior to Palliative referral  1  Clinical Assessment  Palliative Performance Scale Score  20%  Pain Max last 24 hours  Not able to report  Pain Min Last 24 hours  Not able to report  Dyspnea Max Last 24 Hours  Not able to report  Dyspnea Min Last 24 hours  Not able to report  Nausea Max Last 24 Hours  Not able to report  Nausea Min Last 24 Hours  Not able to report  Anxiety Max Last 24 Hours  Not able to report  Anxiety Min Last 24 Hours  Not able to report  Other Max Last 24 Hours  Not able to report  Psychosocial & Spiritual Assessment  Palliative Care Outcomes  Patient/Family meeting held?  Yes  Who was at the meeting?  pt's son and dtr  Patient/Family wishes: Interventions discontinued/not started   Mechanical Ventilation, BiPAP, Hemodialysis, Transfusion, Tube feedings/TPN, Antibiotics, PEG, Trach, NIPPV, Vasopressors  Palliative Care follow-up planned  Yes, Facility      Patient Active Problem List   Diagnosis Date Noted  . End of life care 02/08/2018  . Status epilepticus ([place])   . Lung cancer metastatic to  brain ([place])   . Palliative care by specialist   . Thrombus   . Pulmonary emboli ([place]) 11/27/2017  . Hypomagnesemia 09/20/2017  . Hepatocellular carcinoma ([place]) 08/20/2017  . Increased liver enzymes 07/07/2017  . Elevated AFP 07/07/2017  . Portal vein thrombosis 07/07/2017  . Hepatitis B infection without delta agent without hepatic coma 07/03/2017  . Hepatitis C virus infection without hepatic coma 07/03/2017  . Hepatic vein thrombosis ([place]) 04/14/2017  . Goals of care, counseling/discussion 03/27/2017  . Encounter for antineoplastic immunotherapy 01/02/2017  . Liver metastasis ([place]) 12/10/2016  . Dyspnea 07/11/2016  . Pneumonia 07/11/2016  . Lung cancer ([place]) 07/11/2016  . Right Hilar mass 07/11/2016  . Aspiration pneumonia ([place]) 06/08/2016  . Cancer associated pain 04/17/2016  . Cerebral infarction, unspecified ([place]) 04/09/2016  . Peripheral neuropathy 04/03/2016  . Bone metastasis ([place]) 03/31/2016  . Primary cancer of right upper lobe of lung ([place]) 03/30/2016  . Protein-calorie malnutrition, severe 03/28/2016  . Lymphoproliferative disease ([place])   . Cough   . Atrial mass   . Eosinophilia 03/27/2016    Palliative Care Assessment & Plan   Patient Profile:  67 y.o. male  with past medical history of hepatitis B, C, rheumatoid arthritis, stage IV lung cancer diagnosed approximately 2 years ago, hepatocellular carcinoma admitted on 02/01/2018 with altered mental status, seizures.  New metastatic disease to the brain was found per CT scan  Consult ordered for goals of care.  Now actively dying.  Appears to unstable for transport    Recommendations/Plan:  Start morphine infusion at 1 mg an hour and 2 mg every 30 minutes as needed  Start scheduled Robinul for secretions of 0.2 mg every 6 hours  Ativan on as-needed basis.  Monitor for need for scheduled dosing  Goals of Care and Additional Recommendations:  Limitations on Scope of Treatment: Full Comfort Care  Code  Status:    Code Status Orders  (From admission, onward)  Start     Ordered   02/08/18 0230  Do not attempt resuscitation (DNR)  Continuous    Question Answer Comment  In the event of cardiac or respiratory ARREST Do not call a "code blue"   In the event of cardiac or respiratory ARREST Do not perform Intubation, CPR, defibrillation or ACLS   In the event of cardiac or respiratory ARREST Use medication by any route, position, wound care, and other measures to relive pain and suffering. May use oxygen, suction and manual treatment of airway obstruction as needed for comfort.      02/08/18 0232    Code Status History    Date Active Date Inactive Code Status Order ID Comments User Context   02/08/2018 01:05 02/08/2018 02:32 DNR [id number]  Ward, Delice Bison, DO ED   11/27/2017 08:56 11/28/2017 18:38 Full Code [id number]  Harrie Foreman, MD Inpatient   08/20/2017 16:00 08/22/2017 19:07 Full Code [id number]  Aletta Edouard, MD Inpatient   03/27/2016 20:43 03/31/2016 18:06 Full Code [id number]  Gladstone Lighter, MD ED    Advance Directive Documentation     Most Recent Value  Type of Advance Directive  Out of facility DNR (pink MOST or yellow form)  Pre-existing out of facility DNR order (yellow form or pink MOST form)  Yellow form placed in chart (order not valid for inpatient use)  "MOST" Form in Place?  No data       Prognosis:   Hours - Days  Discharge Planning:  Anticipated Hospital Death   Thank you for allowing the Palliative Medicine Team to assist in the care of this patient.   Time In: 1430 Time Out: 1505 Total Time 35 min Prolonged Time Billed  no       Greater than 50%  of this time was spent counseling and coordinating care related to the above assessment and plan.  Dory Horn, NP  Please contact Palliative Medicine Team phone at [phone] for questions and concerns.

## 2018-02-09 NOTE — Progress Notes (Signed)
Hospitalist progress note   Gerald [name]  AOZ:[id number] DOB: February 18, 1951 DOA: 01/24/2018 PCP: Patient, No Pcp Per   Specialists:   Brief Narrative:  31 male with hep C/B, Rh Arthritis Lung Ca and [place] admitted 3/1 wth sz and decreased LOC and R gaze preference Neuro consulted-felt not code stroke And loaded with Keppra and dilantin    Assessment & Plan:   Assessment:  The encounter diagnosis was Status epilepticus ([name]).  Seizures-has a brain mass, unclear primary-continue Ativan 1 mg sl q4 prn oprevent Sz Cancer-terminal-appreciate Palliative care input-if survives beyong the next 24 hours and stable without significant decline, will looks for free standing hospice placement   DVT prophylaxis: lovenoex  Code Status:   dnr comfort   Family Communication:   Daughter [adopted]  Disposition Plan: likely hopsital deat   Consultants:   pallaitve  Procedures:   none  Antimicrobials:   na   Subjective: Sleepy  restless overnight Morphine increased by pallaitve  Objective: Vitals:   02/08/18 0250 02/08/18 0300 02/08/18 0350 02/09/18 0615  BP:  (!) 93/57 94/70 (!) 91/55  Pulse: 89 87 93 (!) 112  Resp: 19 (!) 24 17 16   Temp:   (!) 97.4 F (36.3 C) 98.4 F (36.9 C)  TempSrc:   Oral Oral  SpO2: 95% 95% 93% 91%    Intake/Output Summary (Last 24 hours) at 02/09/2018 1428 Last data filed at 02/09/2018 1008 Gross per 24 hour  Intake 0 ml  Output 300 ml  Net -300 ml   There were no vitals filed for this visit.  Examination:  eomi frail  Wasting cta b Not really responsive Neuro cannot be checked  Data Reviewed: I have personally reviewed following labs and imaging studies  CBC: Recent Labs  Lab 01/31/2018 2336 02/02/2018 2344  WBC 8.8  --   NEUTROABS 7.1  --   HGB 13.2 13.6  HCT 37.9* 40.0  MCV 105.6*  --   PLT 128*  --    Basic Metabolic Panel: Recent Labs  Lab 01/20/2018 2336 01/29/2018 2344  NA 130* 135  K 5.0 5.0  CL 97* 98*  CO2 22  --   GLUCOSE  104* 99  BUN 22* 23*  CREATININE 0.96 0.70  CALCIUM 9.0  --    GFR: CrCl cannot be calculated (Unknown ideal weight.). Liver Function Tests: Recent Labs  Lab 02/03/2018 2336  AST 102*  ALT 37  ALKPHOS 135*  BILITOT 5.4*  PROT 8.2*  ALBUMIN 1.7*   No results for input(s): LIPASE, AMYLASE in the last 168 hours. Recent Labs  Lab 01/31/2018 2340  AMMONIA 91*   Coagulation Profile: Recent Labs  Lab 01/19/2018 2336  INR 1.54   Cardiac Enzymes: No results for input(s): CKTOTAL, CKMB, CKMBINDEX, TROPONINI in the last 168 hours. CBG: No results for input(s): GLUCAP in the last 168 hours. Urine analysis:    Component Value Date/Time   COLORURINE AMBER (A) 10/15/2017 1609   APPEARANCEUR CLEAR (A) 10/15/2017 1609   LABSPEC 1.025 10/15/2017 1609   PHURINE 5.0 10/15/2017 1609   GLUCOSEU NEGATIVE 10/15/2017 1609   HGBUR NEGATIVE 10/15/2017 1609   BILIRUBINUR NEGATIVE 10/15/2017 1609   KETONESUR NEGATIVE 10/15/2017 1609   PROTEINUR 100 (A) 10/15/2017 1609   NITRITE NEGATIVE 10/15/2017 1609   LEUKOCYTESUR NEGATIVE 10/15/2017 1609     Radiology Studies: Reviewed images personally in health database    Scheduled Meds: . glycopyrrolate  0.2 mg Intravenous Q6H  . sodium chloride flush  3 mL Intravenous  Q12H   Continuous Infusions: . sodium chloride    . morphine 1 mg/ml infusion       LOS: 1 day    Time spent: [place], MD [facility] ([facility]   If 7PM-7AM, please contact night-coverage [email or web address] Password [email or web address] 02/09/2018, 2:28 PM

## 2018-02-09 NOTE — Progress Notes (Signed)
Palliative Medicine RN Note: Symptom check. Patient is breathing about 28 times/minute. Family reports grunting/groaning & agitation with movement. He has gotten prn doses of morphine, but they do not seem to be managing his dyspnea and pain; I have a message in to PMT NP requesting scheduled morphine.  "Adopted" daughter [name] at bedside. She had a lot of questions about the dying process and patient's comfort; we discussed these concerns at length. She got married Wednesday 2/27, and the reception was supposed to be today. The family is tentatively planning to bring the food and cake to [facility] this afternoon to have a very small family reception so that Mr Bortner can be involved; RN is aware of their plans, and the staff is in full support of this celebration.  At this time, PMT does not recommend transfer to hospice d/t his respiratory status and continuing changes in medication needs, as well as the family's unusual psychosocial circumstances. If Mr Facchini stabilizes tomorrow, the family has been prepared for a potential transfer to hospice.  Marjie Skiff Orpha Dain, RN, BSN, [facility] Palliative Medicine Team 02/09/2018 9:51 AM Office [phone]

## 2018-02-10 DIAGNOSIS — L89150 Pressure ulcer of sacral region, unstageable: Secondary | ICD-10-CM | POA: Insufficient documentation

## 2018-02-10 MED ORDER — MORPHINE BOLUS VIA INFUSION
4.0000 mg | INTRAVENOUS | Status: DC | PRN
  Filled 2018-02-10: qty 4

## 2018-02-11 ENCOUNTER — Telehealth: Payer: Self-pay | Admitting: *Deleted

## 2018-02-11 NOTE — Telephone Encounter (Signed)
Call from cousin that patient expired yesterday. Per chart he was pronounced at 1400

## 2018-02-21 ENCOUNTER — Other Ambulatory Visit: Payer: Self-pay | Admitting: Nurse Practitioner

## 2018-02-25 ENCOUNTER — Other Ambulatory Visit: Payer: Medicare Other

## 2018-02-25 ENCOUNTER — Ambulatory Visit: Payer: Medicare Other | Admitting: Hematology and Oncology

## 2018-02-26 ENCOUNTER — Telehealth: Payer: Self-pay | Admitting: Hematology and Oncology

## 2018-02-26 NOTE — Telephone Encounter (Signed)
Oral Oncology Patient Advocate Encounter  Called [employer] to stop patients medication shipment. Spoke with [facility] Patient Advocate [phone] 02/26/2018 3:22 PM

## 2018-03-11 NOTE — Progress Notes (Signed)
Daily Progress Note   Patient Name: Gerald [name]       Date: 22-Feb-2018 DOB: 1951/01/27  Age: 67 y.o. MRN#: [id number] Attending Physician: Nita Sells, MD Primary Care Physician: Patient, No Pcp Per Admit Date: 01/27/2018  Reason for Consultation/Follow-up: Terminal Care  Subjective: Increased respiratory rate, otherwise appears comfortable.  Length of Stay: 2  Current Medications: Scheduled Meds:  . glycopyrrolate  0.2 mg Intravenous Q6H  . sodium chloride flush  3 mL Intravenous Q12H    Continuous Infusions: . sodium chloride 250 mL (February 22, 2018 1301)  . morphine 1 mg/ml infusion 1 mg/hr (02/09/18 1516)    PRN Meds: sodium chloride, acetaminophen **OR** acetaminophen, antiseptic oral rinse, glycopyrrolate **OR** glycopyrrolate **OR** glycopyrrolate, haloperidol **OR** haloperidol **OR** haloperidol lactate, LORazepam **OR** LORazepam **OR** LORazepam, morphine, ondansetron **OR** ondansetron (ZOFRAN) IV, polyvinyl alcohol, sodium chloride flush  Physical Exam          Vital Signs: BP (!) 87/56 (BP Location: Right Arm)   Pulse (!) 117   Temp 98.3 F (36.8 C) (Axillary)   Resp (!) 24   SpO2 (!) 74%  SpO2: SpO2: (!) 74 % O2 Device: O2 Device: Nasal Cannula O2 Flow Rate: O2 Flow Rate (L/min): 2 L/min  Intake/output summary:   Intake/Output Summary (Last 24 hours) at Feb 22, 2018 1304 Last data filed at 2018/02/22 1112 Gross per 24 hour  Intake 14.73 ml  Output 125 ml  Net -110.27 ml   LBM: Last BM Date: 01/16/2018 Baseline Weight:   Most recent weight:    Appears dusky and pale. Labored breathing. Diaphoretic.      Palliative Assessment/Data:    Flowsheet Rows     Most Recent Value  Intake Tab  Referral Department  Hospitalist  Unit at Time of Referral   Med/Surg Unit  Palliative Care Primary Diagnosis  Cancer  Date Notified  02/08/18  Palliative Care Type  New Palliative care  Reason for referral  Clarify Goals of Care, Non-pain Symptom, Pain, Counsel Regarding Hospice, Psychosocial or Spiritual support  Date of Admission  01/19/2018  Date first seen by Palliative Care  02/08/18  # of days Palliative referral response time  0 Day(s)  # of days IP prior to Palliative referral  1  Clinical Assessment  Palliative Performance Scale Score  20%  Pain Max last 24 hours  Not able to report  Pain Min Last 24 hours  Not able to report  Dyspnea Max Last 24 Hours  Not able to report  Dyspnea Min Last 24 hours  Not able to report  Nausea Max Last 24 Hours  Not able to report  Nausea Min Last 24 Hours  Not able to report  Anxiety Max Last 24 Hours  Not able to report  Anxiety Min Last 24 Hours  Not able to report  Other Max Last 24 Hours  Not able to report  Psychosocial & Spiritual Assessment  Palliative Care Outcomes  Patient/Family meeting held?  Yes  Who was at the meeting?  pt's son and dtr  Patient/Family wishes: Interventions discontinued/not started   Mechanical Ventilation, BiPAP, Hemodialysis, Transfusion, Tube feedings/TPN, Antibiotics, PEG, Trach, NIPPV, Vasopressors  Palliative Care follow-up planned  Yes, Facility      Patient Active Problem List   Diagnosis Date Noted  . Pressure injury of sacral region, unstageable ([place]) Mar 12, 2018  . Tachypnea   . End of life care 02/08/2018  . Status epilepticus ([place])   . Lung cancer metastatic to brain ([place])   . Palliative care by specialist   . Thrombus   . Pulmonary emboli ([place]) 11/27/2017  . Hypomagnesemia 09/20/2017  . Hepatocellular carcinoma ([place]) 08/20/2017  . Increased liver enzymes 07/07/2017  . Elevated AFP 07/07/2017  . Portal vein thrombosis 07/07/2017  . Hepatitis B infection without delta agent without hepatic coma 07/03/2017  . Hepatitis C virus infection without  hepatic coma 07/03/2017  . Hepatic vein thrombosis ([place]) 04/14/2017  . Goals of care, counseling/discussion 03/27/2017  . Encounter for antineoplastic immunotherapy 01/02/2017  . Liver metastasis ([place]) 12/10/2016  . Dyspnea 07/11/2016  . Pneumonia 07/11/2016  . Lung cancer ([place]) 07/11/2016  . Right Hilar mass 07/11/2016  . Aspiration pneumonia ([place]) 06/08/2016  . Cancer associated pain 04/17/2016  . Cerebral infarction, unspecified ([place]) 04/09/2016  . Peripheral neuropathy 04/03/2016  . Bone metastasis ([place]) 03/31/2016  . Primary cancer of right upper lobe of lung ([place]) 03/30/2016  . Protein-calorie malnutrition, severe 03/28/2016  . Lymphoproliferative disease ([place])   . Cough   . Atrial mass   . Eosinophilia 03/27/2016    Palliative Care Assessment & Plan   Terminal Care  Increased Morphine infusion to 2mg /hr and removed nasal cannula O2.  Family at bedside.   Prognosis:   Hours - Days  Discharge Planning:  Anticipated Hospital Death  Care plan was discussed with family and RN.  Thank you for allowing the Palliative Medicine Team to assist in the care of this patient.   Time: 25 min    Greater than 50%  of this time was spent counseling and coordinating care related to the above assessment and plan.  Lane Hacker, DO  Please contact Palliative Medicine Team phone at [phone] for questions and concerns.

## 2018-03-11 NOTE — Death Summary Note (Signed)
.   Death Summary  Gerald [name] [id number]:[id number] DOB: 08-Oct-1951 DOA: 03-05-2018  PCP: Patient, No Pcp Per PCP/Office notified: no  Admit date: 2018/03/05 Date of Death: 2018-03-08  Final Diagnoses:  Active Problems:   End of life care   Palliative care by specialist   Tachypnea    History of present illness:  34 male with hep C/B, Rh Arthritis Lung Ca and [place] admitted 3/1 wth sz and decreased LOC and R gaze preference Neuro consulted-felt not code stroke And loaded with Keppra and dilantin    Hospital Course:  Patient seen by palliative care and comfort measures initiated--felt over course of 3/1 to 3/2 too unstable to transport and ultimately passed on 03-08-2018 at 14:00 Confirmed at bedside no chest rise, no respirations and no heartbeat Death cert done and condolences given to famly   Time: 15  Signed:  Nita Sells  [facility] 03/08/18, 3:33 PM

## 2018-03-11 NOTE — Progress Notes (Signed)
Spoke with Gerald [name] patients adopted daughter and also POA about whether there is an ICD present and if so, what kind?  She states "he's never had any kind of implants for the heart"  Will continue to monitor.

## 2018-03-11 NOTE — Progress Notes (Addendum)
Patient found to have no pulse and no respirations.  Family present at bedside.  Verified by Court Joy, RN

## 2018-03-11 NOTE — Progress Notes (Signed)
Called 1-800-[employer] to have ICD turned to "off" and patient is not found in the [employer] data base.  Will touch base with family to verify correct vendor.

## 2018-03-11 DEATH — deceased
